# Patient Record
Sex: Female | Born: 1935 | Race: Black or African American | Hispanic: No | Smoking: Never smoker
Health system: Southern US, Community
[De-identification: ages and names within clinical notes are randomized; demographics above are authoritative.]

## PROBLEM LIST (undated history)

## (undated) DIAGNOSIS — F0390 Unspecified dementia without behavioral disturbance: Secondary | ICD-10-CM

## (undated) DIAGNOSIS — M810 Age-related osteoporosis without current pathological fracture: Secondary | ICD-10-CM

## (undated) DIAGNOSIS — M25562 Pain in left knee: Secondary | ICD-10-CM

## (undated) DIAGNOSIS — N3941 Urge incontinence: Secondary | ICD-10-CM

## (undated) DIAGNOSIS — C50919 Malignant neoplasm of unspecified site of unspecified female breast: Secondary | ICD-10-CM

## (undated) DIAGNOSIS — M25561 Pain in right knee: Secondary | ICD-10-CM

## (undated) HISTORY — PX: DENTAL SURGERY: SHX609

## (undated) HISTORY — DX: Malignant neoplasm of unspecified site of unspecified female breast: C50.919

---

## 1999-01-12 HISTORY — PX: BREAST LUMPECTOMY WITH SENTINEL LYMPH NODE BIOPSY: SHX5597

## 2017-01-17 DIAGNOSIS — M6281 Muscle weakness (generalized): Secondary | ICD-10-CM | POA: Diagnosis not present

## 2017-01-17 DIAGNOSIS — Z9181 History of falling: Secondary | ICD-10-CM | POA: Diagnosis not present

## 2017-01-17 DIAGNOSIS — F419 Anxiety disorder, unspecified: Secondary | ICD-10-CM | POA: Diagnosis not present

## 2017-01-17 DIAGNOSIS — M810 Age-related osteoporosis without current pathological fracture: Secondary | ICD-10-CM | POA: Diagnosis not present

## 2017-01-17 DIAGNOSIS — F329 Major depressive disorder, single episode, unspecified: Secondary | ICD-10-CM | POA: Diagnosis not present

## 2017-01-17 DIAGNOSIS — M1380 Other specified arthritis, unspecified site: Secondary | ICD-10-CM | POA: Diagnosis not present

## 2017-01-17 DIAGNOSIS — L853 Xerosis cutis: Secondary | ICD-10-CM | POA: Diagnosis not present

## 2017-01-17 DIAGNOSIS — R489 Unspecified symbolic dysfunctions: Secondary | ICD-10-CM | POA: Diagnosis not present

## 2017-01-17 DIAGNOSIS — G309 Alzheimer's disease, unspecified: Secondary | ICD-10-CM | POA: Diagnosis not present

## 2017-01-17 DIAGNOSIS — I10 Essential (primary) hypertension: Secondary | ICD-10-CM | POA: Diagnosis not present

## 2017-03-31 ENCOUNTER — Encounter: Payer: Self-pay | Admitting: Physician Assistant

## 2017-03-31 ENCOUNTER — Ambulatory Visit (INDEPENDENT_AMBULATORY_CARE_PROVIDER_SITE_OTHER): Payer: Medicare Other | Admitting: Physician Assistant

## 2017-03-31 ENCOUNTER — Other Ambulatory Visit: Payer: Self-pay

## 2017-03-31 VITALS — BP 146/80 | HR 102 | Temp 97.9°F | Resp 16 | Ht 65.75 in | Wt 144.8 lb

## 2017-03-31 DIAGNOSIS — F0390 Unspecified dementia without behavioral disturbance: Secondary | ICD-10-CM | POA: Insufficient documentation

## 2017-03-31 DIAGNOSIS — F0280 Dementia in other diseases classified elsewhere without behavioral disturbance: Secondary | ICD-10-CM | POA: Diagnosis not present

## 2017-03-31 DIAGNOSIS — F411 Generalized anxiety disorder: Secondary | ICD-10-CM | POA: Diagnosis not present

## 2017-03-31 DIAGNOSIS — Z0000 Encounter for general adult medical examination without abnormal findings: Secondary | ICD-10-CM

## 2017-03-31 DIAGNOSIS — R5383 Other fatigue: Secondary | ICD-10-CM

## 2017-03-31 DIAGNOSIS — M25562 Pain in left knee: Secondary | ICD-10-CM

## 2017-03-31 DIAGNOSIS — G309 Alzheimer's disease, unspecified: Secondary | ICD-10-CM

## 2017-03-31 DIAGNOSIS — Z5181 Encounter for therapeutic drug level monitoring: Secondary | ICD-10-CM

## 2017-03-31 DIAGNOSIS — M810 Age-related osteoporosis without current pathological fracture: Secondary | ICD-10-CM | POA: Diagnosis not present

## 2017-03-31 DIAGNOSIS — R54 Age-related physical debility: Secondary | ICD-10-CM | POA: Diagnosis not present

## 2017-03-31 DIAGNOSIS — G8929 Other chronic pain: Secondary | ICD-10-CM

## 2017-03-31 DIAGNOSIS — M25561 Pain in right knee: Secondary | ICD-10-CM | POA: Insufficient documentation

## 2017-03-31 DIAGNOSIS — N3941 Urge incontinence: Secondary | ICD-10-CM | POA: Diagnosis not present

## 2017-03-31 MED ORDER — MIRABEGRON ER 50 MG PO TB24: 50.0000 mg | ORAL_TABLET | Freq: Every day | ORAL | 5 refills | Status: DC

## 2017-03-31 NOTE — Progress Notes (Addendum)
Patient ID: Jocelyn Mccann MRN: [id number], DOB: [birth year], 84 y.o. Date of Encounter: @DATE @  Chief Complaint:  Chief Complaint  Patient presents with  . New Patient (Initial Visit)    HPI: 84 y.o. year old female  presents as a New Patient to Establish Care.  Her daughter accompanies her for visit. Her daughter reports that patient was living in [place]. Reports that they just had her move here to live with daughter and her family-- moved here January 26. Daughter states that patient is not happy about moving here because it has decreased her independence. Daughter reports that patient had lived in [place] for 20 years.   States that they had a hired caregiver who would help patient on Mondays Wednesdays Fridays.   Family would order food and the caregiver would drive and pick up the food.   States that patient had quit driving about 10 years ago.    Reports that patient fell in January.  The caregiver took her to the hospital and then she went to rehab for 20 days.  Was felt the patient could not live by herself anymore so she is now here living with daughter and her family.  Daughter does not work a job.  Patient's daughter reports that her husband works as a professor at [employer].  Says that her son is at college and her daughter is home.  Says that they make sure that someone is always at home with patient. Daughter states that she never was with patient when she went to her doctor's appointments in [place] so she really does not know patient's diagnoses/medical history.  Daughter says that she does notice that patient forgets a lot but says "but she is fine if she is watching TV." During the visit today I went through each of patient's medicines and explained what that particular medicine is usually used for. Explained that Aricept is used for dementia but discussed that the medicines for dementia will slow the progression of the disease but do not reverse the disease. Her daughter  states that she wants an evaluation to see if patient truly has dementia. Discussed that we will do a Mini-Mental exam today.  Daughter states that patient's knees hurt and that limits her activity and causes her to feel depressed because she cannot do anything secondary to knee pain. Daughter says that she wants a referral to [facility] orthopedics.  Wants to see if they could possibly do surgery or could possibly do injections to her knees. Reviewed with daughter that the medicine list includes Mobic and tramadol.  Asked how frequently she is taking the tramadol. Daughter states that she tries to get patient to take 1 tramadol per day but has hard time to get her to even take that.   Says the patient will drink some tea in the morning-- will have tea and crackers but then she does not want to take any kind of pill later in the day because she does not want to drink anything else later in the day because it will make her go to the bathroom too much. Says that she urinates too frequently and has urgency and can barely get to the bathroom in time. Says that she sleeps pretty good. Daughter also notes that she notices patient belching some. Daughter has no other specific concerns that she has noticed that needs to be addressed. Patient reports that she has no additional concerns that she is aware of to address. States that the urination is  a problem and that she is concerned that patient is dehydrated because she will not drink enough fluid because she does not want to go to the bathroom.  Addendum Added 05/02/2017: Received note from Dr. Tonita Cong at [facility].  Note was dated 04/22/2017. Arthritis of knees bilaterally.  Planned Visco supplementation.  See her back following that.  Rolling walker activity modification Tylenol with her tramadol.     No past medical history on file.   Home Meds: Outpatient Medications Prior to Visit  Medication Sig Dispense Refill  . alendronate  (FOSAMAX) 70 MG tablet Take 70 mg by mouth once a week. Take with a full glass of water on an empty stomach.    . busPIRone (BUSPAR) 7.5 MG tablet Take 7.5 mg by mouth daily.    . citalopram (CELEXA) 20 MG tablet Take 20 mg by mouth at bedtime.    . donepezil (ARICEPT) 5 MG tablet Take 5 mg by mouth at bedtime.    . meloxicam (MOBIC) 7.5 MG tablet Take 7.5 mg by mouth daily.    . traMADol (ULTRAM) 50 MG tablet Take 50 mg by mouth every 6 (six) hours as needed.     No facility-administered medications prior to visit.     Allergies: No Known Allergies  Social History   Socioeconomic History  . Marital status: Legally Separated    Spouse name: Not on file  . Number of children: Not on file  . Years of education: Not on file  . Highest education level: Not on file  Occupational History  . Not on file  Social Needs  . Financial resource strain: Not on file  . Food insecurity:    Worry: Not on file    Inability: Not on file  . Transportation needs:    Medical: Not on file    Non-medical: Not on file  Tobacco Use  . Smoking status: Never Smoker  . Smokeless tobacco: Never Used  Substance and Sexual Activity  . Alcohol use: Never    Frequency: Never  . Drug use: Never  . Sexual activity: Not on file  Lifestyle  . Physical activity:    Days per week: Not on file    Minutes per session: Not on file  . Stress: Not on file  Relationships  . Social connections:    Talks on phone: Not on file    Gets together: Not on file    Attends religious service: Not on file    Active member of club or organization: Not on file    Attends meetings of clubs or organizations: Not on file    Relationship status: Not on file  . Intimate partner violence:    Fear of current or ex partner: Not on file    Emotionally abused: Not on file    Physically abused: Not on file    Forced sexual activity: Not on file  Other Topics Concern  . Not on file  Social History Narrative  . Not on file     No family history on file.   Review of Systems:  See HPI for pertinent ROS. All other ROS negative.    Physical Exam: Blood pressure (!) 146/80, pulse (!) 102, temperature 97.9 F (36.6 C), temperature source Oral, resp. rate 16, height 5' 5.75" (1.67 m), weight 65.7 kg (144 lb 12.8 oz), SpO2 100 %., Body mass index is 23.55 kg/m. General: AAF. Appears in no acute distress. Neck: Supple. No thyromegaly. No lymphadenopathy.  No carotid bruits.  Lungs: Clear bilaterally to auscultation without wheezes, rales, or rhonchi. Breathing is unlabored. Heart: RRR with S1 S2. No murmurs, rubs, or gallops.  Cardiac sounds are loud but this is because she is then through the chest wall. Abdomen: Soft, non-tender, non-distended with normoactive bowel sounds. No hepatomegaly. No rebound/guarding. No obvious abdominal masses. Musculoskeletal:  Strength and tone normal for age. Extremities/Skin: Warm and dry.  No LE edema.  Neuro: Alert and oriented X 3. Moves all extremities spontaneously. Gait is normal. CNII-XII grossly in tact. Psych:  Responds to questions appropriately with a normal affect.    Mini mental exam was performed at visit 03/31/2017.  Total score was 19 which is consistent with moderate dementia.  ASSESSMENT AND PLAN:  84 y.o. year old female with    1. Encounter for medical examination to establish care Will check some labs to have as a baseline  2. Chronic pain of both knees Will start Myrbetriq and hopefully this will help to decrease her urinary frequency and urgency so then she can take the tramadol more frequently. Also will go ahead and refer to [facility] orthopedic per daughter's request. - AMB referral to orthopedics  Addendum Added 05/02/2017: Received note from Dr. Tonita Cong at [facility].  Note was dated 04/22/2017. Arthritis of knees bilaterally.  Planned Visco supplementation.  See her back following that.  Rolling walker activity modification Tylenol with  her tramadol.   3. Urge incontinence of urine Will start Myrbetriq and hope this will help decrease her frequency and urgency. - mirabegron ER (MYRBETRIQ) 50 MG TB24 tablet; Take 1 tablet (50 mg total) by mouth daily.  Dispense: 30 tablet; Refill: 5  4. Generalized anxiety disorder She is on BuSpar and Celexa.  I am assuming she has a history of anxiety given these medications but I have no other additional information regarding this.  5. Osteoporosis without current pathological fracture, unspecified osteoporosis type She is on Fosamax so I am assuming history of osteoporosis but I do not have any medical records.  I do not know the date of when the Fosamax was started and when this needs to be discontinued.  6. Alzheimer's dementia without behavioral disturbance, unspecified timing of dementia onset She is on Aricept so I am assuming this was started for dementia.  I have no records.  Daughter wants this evaluated so we are doing Mini-Mental exam today.   Will plan for follow-up visit in 1 month.  Follow-up sooner if needed.    [address] [place], [place], [facility] 03/31/2017 11:23 AM

## 2017-04-07 LAB — COMPLETE METABOLIC PANEL WITH GFR
AG RATIO: 1.1 (calc) (ref 1.0–2.5)
ALT: 10 U/L (ref 6–29)
AST: 16 U/L (ref 10–35)
Albumin: 3.8 g/dL (ref 3.6–5.1)
Alkaline phosphatase (APISO): 81 U/L (ref 33–130)
BUN: 14 mg/dL (ref 7–25)
CALCIUM: 9.6 mg/dL (ref 8.6–10.4)
CO2: 29 mmol/L (ref 20–32)
CREATININE: 0.85 mg/dL (ref 0.60–0.88)
Chloride: 103 mmol/L (ref 98–110)
GFR, EST AFRICAN AMERICAN: 71 mL/min/{1.73_m2} (ref >=60)
GFR, EST NON AFRICAN AMERICAN: 62 mL/min/{1.73_m2} (ref >=60)
GLOBULIN: 3.6 g/dL (ref 1.9–3.7)
Glucose, Bld: 120 mg/dL — ABNORMAL HIGH (ref 65–99)
POTASSIUM: 3.9 mmol/L (ref 3.5–5.3)
Sodium: 139 mmol/L (ref 135–146)
TOTAL PROTEIN: 7.4 g/dL (ref 6.1–8.1)
Total Bilirubin: 0.5 mg/dL (ref 0.2–1.2)

## 2017-04-07 LAB — CBC WITH DIFFERENTIAL/PLATELET
BASOS PCT: 0.6 %
Basophils Absolute: 20 cells/uL (ref 0–200)
EOS PCT: 0.3 %
Eosinophils Absolute: 10 cells/uL — ABNORMAL LOW (ref 15–500)
HEMATOCRIT: 40.8 % (ref 35.0–45.0)
Hemoglobin: 13.7 g/dL (ref 11.7–15.5)
LYMPHS ABS: 1297 {cells}/uL (ref 850–3900)
MCH: 30.8 pg (ref 27.0–33.0)
MCHC: 33.6 g/dL (ref 32.0–36.0)
MCV: 91.7 fL (ref 80.0–100.0)
MPV: 9.1 fL (ref 7.5–12.5)
Monocytes Relative: 13.8 %
Neutro Abs: 1518 cells/uL (ref 1500–7800)
Neutrophils Relative %: 46 %
Platelets: 221 10*3/uL (ref 140–400)
RBC: 4.45 10*6/uL (ref 3.80–5.10)
RDW: 12.1 % (ref 11.0–15.0)
Total Lymphocyte: 39.3 %
WBC: 3.3 10*3/uL — AB (ref 3.8–10.8)
WBCMIX: 455 {cells}/uL (ref 200–950)

## 2017-04-07 LAB — TEST AUTHORIZATION

## 2017-04-07 LAB — TEST AUTHORIZATION 2

## 2017-04-07 LAB — T4, FREE

## 2017-04-07 LAB — TSH: TSH: 0.37 mIU/L — ABNORMAL LOW (ref 0.40–4.50)

## 2017-04-07 LAB — HEMOGLOBIN A1C W/OUT EAG: Hgb A1c MFr Bld: 5.7 % of total Hgb — ABNORMAL HIGH (ref <5.7)

## 2017-04-14 ENCOUNTER — Other Ambulatory Visit: Payer: Medicare HMO

## 2017-04-14 ENCOUNTER — Other Ambulatory Visit: Payer: Self-pay

## 2017-04-14 DIAGNOSIS — R7989 Other specified abnormal findings of blood chemistry: Secondary | ICD-10-CM | POA: Diagnosis not present

## 2017-04-14 DIAGNOSIS — E039 Hypothyroidism, unspecified: Secondary | ICD-10-CM | POA: Diagnosis not present

## 2017-04-14 DIAGNOSIS — Z1329 Encounter for screening for other suspected endocrine disorder: Secondary | ICD-10-CM | POA: Diagnosis not present

## 2017-04-15 ENCOUNTER — Other Ambulatory Visit: Payer: Medicare HMO

## 2017-04-15 DIAGNOSIS — M25562 Pain in left knee: Secondary | ICD-10-CM | POA: Diagnosis not present

## 2017-04-15 DIAGNOSIS — M25561 Pain in right knee: Secondary | ICD-10-CM | POA: Diagnosis not present

## 2017-04-15 DIAGNOSIS — M170 Bilateral primary osteoarthritis of knee: Secondary | ICD-10-CM | POA: Diagnosis not present

## 2017-04-15 LAB — TSH: TSH: 0.43 m[IU]/L (ref 0.40–4.50)

## 2017-04-15 LAB — T4, FREE: FREE T4: 1.4 ng/dL (ref 0.8–1.8)

## 2017-04-22 DIAGNOSIS — M1711 Unilateral primary osteoarthritis, right knee: Secondary | ICD-10-CM | POA: Diagnosis not present

## 2017-04-22 DIAGNOSIS — M1712 Unilateral primary osteoarthritis, left knee: Secondary | ICD-10-CM | POA: Diagnosis not present

## 2017-04-22 DIAGNOSIS — M25561 Pain in right knee: Secondary | ICD-10-CM | POA: Diagnosis not present

## 2017-04-22 DIAGNOSIS — M25562 Pain in left knee: Secondary | ICD-10-CM | POA: Diagnosis not present

## 2017-05-04 ENCOUNTER — Other Ambulatory Visit: Payer: Self-pay | Admitting: Physician Assistant

## 2017-05-04 ENCOUNTER — Encounter: Payer: Self-pay | Admitting: Physician Assistant

## 2017-05-04 ENCOUNTER — Ambulatory Visit (INDEPENDENT_AMBULATORY_CARE_PROVIDER_SITE_OTHER): Payer: Medicare Other | Admitting: Physician Assistant

## 2017-05-04 ENCOUNTER — Other Ambulatory Visit: Payer: Self-pay

## 2017-05-04 VITALS — BP 144/78 | HR 83 | Temp 97.4°F | Resp 14 | Ht 66.0 in | Wt 142.2 lb

## 2017-05-04 DIAGNOSIS — R3915 Urgency of urination: Secondary | ICD-10-CM | POA: Diagnosis not present

## 2017-05-04 DIAGNOSIS — R350 Frequency of micturition: Secondary | ICD-10-CM

## 2017-05-04 DIAGNOSIS — N390 Urinary tract infection, site not specified: Secondary | ICD-10-CM | POA: Diagnosis not present

## 2017-05-04 LAB — URINALYSIS, ROUTINE W REFLEX MICROSCOPIC
BILIRUBIN URINE: NEGATIVE
GLUCOSE, UA: NEGATIVE
Leukocytes, UA: NEGATIVE
Nitrite: POSITIVE — AB
Specific Gravity, Urine: 1.02 (ref 1.001–1.03)
pH: 6 (ref 5.0–8.0)

## 2017-05-04 LAB — MICROSCOPIC MESSAGE

## 2017-05-04 MED ORDER — CIPROFLOXACIN HCL 500 MG PO TABS: 500.0000 mg | ORAL_TABLET | Freq: Two times a day (BID) | ORAL | 0 refills | Status: AC

## 2017-05-04 NOTE — Progress Notes (Signed)
Patient ID: ANU STAGNER MRN: [id number], DOB: 11/07/1928, 84 y.o. Date of Encounter: @DATE @  Chief Complaint:  Chief Complaint  Patient presents with  . 1 month follow up    HPI: 84 y.o. year old female     03/31/2017:  presents as a New Patient to [facility].  Her daughter accompanies her for visit. Her daughter reports that patient was living in [place]. Reports that they just had her move here to live with daughter and her family-- moved here January 26. Daughter states that patient is not happy about moving here because it has decreased her independence. Daughter reports that patient had lived in [place] for 20 years.   States that they had a hired caregiver who would help patient on Mondays Wednesdays Fridays.   Family would order food and the caregiver would drive and pick up the food.   States that patient had quit driving about 10 years ago.    Reports that patient fell in January.  The caregiver took her to the hospital and then she went to rehab for 20 days.  Was felt the patient could not live by herself anymore so she is now here living with daughter and her family.  Daughter does not work a job.  Patient's daughter reports that her husband works as a professor at [employer].  Says that her son is at college and her daughter is home.  Says that they make sure that someone is always at home with patient. Daughter states that she never was with patient when she went to her doctor's appointments in [place] so she really does not know patient's diagnoses/medical history.  Daughter says that she does notice that patient forgets a lot but says "but she is fine if she is watching TV." During the visit today I went through each of patient's medicines and explained what that particular medicine is usually used for. Explained that Aricept is used for dementia but discussed that the medicines for dementia will slow the progression of the disease but do not reverse the  disease. Her daughter states that she wants an evaluation to see if patient truly has dementia. Discussed that we will do a Mini-Mental exam today.  Daughter states that patient's knees hurt and that limits her activity and causes her to feel depressed because she cannot do anything secondary to knee pain. Daughter says that she wants a referral to [facility] orthopedics.  Wants to see if they could possibly do surgery or could possibly do injections to her knees. Reviewed with daughter that the medicine list includes Mobic and tramadol.  Asked how frequently she is taking the tramadol. Daughter states that she tries to get patient to take 1 tramadol per day but has hard time to get her to even take that.   Says the patient will drink some tea in the morning-- will have tea and crackers but then she does not want to take any kind of pill later in the day because she does not want to drink anything else later in the day because it will make her go to the bathroom too much. Says that she urinates too frequently and has urgency and can barely get to the bathroom in time. Says that she sleeps pretty good. Daughter also notes that she notices patient belching some. Daughter has no other specific concerns that she has noticed that needs to be addressed. Patient reports that she has no additional concerns that she is aware of to address.  States that the urination is a problem and that she is concerned that patient is dehydrated because she will not drink enough fluid because she does not want to go to the bathroom.  Addendum Added 05/02/2017: Received note from Dr. Tonita Cong at [facility].  Note was dated 04/22/2017. Arthritis of knees bilaterally.  Planned Visco supplementation.  See her back following that.  Rolling walker activity modification Tylenol with her tramadol.    AT THAT OV:  Mini-Mental exam was performed at visit 03/31/2017.  Total score was 19 which is consistent with moderate  dementia.   At that visit also check labs to have as a baseline. Labs obtained at office visit 03/31/17 CBC normal CMET normal A1c normal 5.7 On initial lab TSH was slightly low at 0.37 (normal range starts at 0.40) Had her come in for repeat/follow-up lab and this was checked TSH was within normal range at 0.43 and free T4 was normal at 1.4.  At that visit also --Referral to [facility] orthopedics regarding her knees --Prescribed Myrbetriq to help with her urinary urgency and frequency -- Continue BuSpar and Celexa --Continued Fosamax --Continue Aricept.   --------------------------------------------------------------------------------------------------------------------------------------------------------------------------------------------------------------------------------------------------------------   05/04/2017:  Her daughter accompanies her for visit again today. Reviewed all of the lab results with them from the last visit and reviewed with them that all labs were normal.   Reviewed above information.  Asked if the Myrbetriq has helped with the urinary urgency and frequency.  They report that she is taking the Myrbetriq daily but they have seen no improvement in the symptoms.  They report that she continues to have significant urinary frequency and urgency and continues to limit her drinking of liquids because she does not want to have to urinate.    No past medical history on file.   Home Meds: Outpatient Medications Prior to Visit  Medication Sig Dispense Refill  . alendronate (FOSAMAX) 70 MG tablet Take 70 mg by mouth once a week. Take with a full glass of water on an empty stomach.    . busPIRone (BUSPAR) 7.5 MG tablet Take 7.5 mg by mouth daily.    . citalopram (CELEXA) 20 MG tablet Take 20 mg by mouth at bedtime.    . donepezil (ARICEPT) 5 MG tablet Take 5 mg by mouth at bedtime.    . meloxicam (MOBIC) 7.5 MG tablet Take 7.5 mg by mouth daily.    . mirabegron ER  (MYRBETRIQ) 50 MG TB24 tablet Take 1 tablet (50 mg total) by mouth daily. 30 tablet 5  . traMADol (ULTRAM) 50 MG tablet Take 50 mg by mouth every 6 (six) hours as needed.     No facility-administered medications prior to visit.     Allergies: No Known Allergies  Social History   Socioeconomic History  . Marital status: Legally Separated    Spouse name: Not on file  . Number of children: Not on file  . Years of education: Not on file  . Highest education level: Not on file  Occupational History  . Not on file  Social Needs  . Financial resource strain: Not on file  . Food insecurity:    Worry: Not on file    Inability: Not on file  . Transportation needs:    Medical: Not on file    Non-medical: Not on file  Tobacco Use  . Smoking status: Never Smoker  . Smokeless tobacco: Never Used  Substance and Sexual Activity  . Alcohol use: Never    Frequency: Never  .  Drug use: Never  . Sexual activity: Not on file  Lifestyle  . Physical activity:    Days per week: Not on file    Minutes per session: Not on file  . Stress: Not on file  Relationships  . Social connections:    Talks on phone: Not on file    Gets together: Not on file    Attends religious service: Not on file    Active member of club or organization: Not on file    Attends meetings of clubs or organizations: Not on file    Relationship status: Not on file  . Intimate partner violence:    Fear of current or ex partner: Not on file    Emotionally abused: Not on file    Physically abused: Not on file    Forced sexual activity: Not on file  Other Topics Concern  . Not on file  Social History Narrative  . Not on file    No family history on file.   Review of Systems:  See HPI for pertinent ROS. All other ROS negative.    Physical Exam: Blood pressure (!) 144/78, pulse 83, temperature (!) 97.4 F (36.3 C), temperature source Oral, resp. rate 14, height 5\' 6"  (1.676 m), weight 64.5 kg (142 lb 3.2 oz), SpO2  97 %., Body mass index is 22.95 kg/m. General: AAF. Appears in no acute distress. Neck: Supple. No thyromegaly. No lymphadenopathy.  No carotid bruits. Lungs: Clear bilaterally to auscultation without wheezes, rales, or rhonchi. Breathing is unlabored. Heart: RRR with S1 S2. No murmurs, rubs, or gallops.  Cardiac sounds are loud but this is because she is then through the chest wall. Abdomen: Soft, non-tender, non-distended with normoactive bowel sounds. No hepatomegaly. No rebound/guarding. No obvious abdominal masses. Musculoskeletal:  Strength and tone normal for age. Extremities/Skin: Warm and dry.  No LE edema.  Neuro: Alert and oriented X 3. Moves all extremities spontaneously. Gait is normal. CNII-XII grossly in tact. Psych:  Responds to questions appropriately with a normal affect.    Mini mental exam was performed at visit 03/31/2017.  Total score was 19 which is consistent with moderate dementia.  ASSESSMENT AND PLAN:  84 y.o. year old female with    Urinary frequency Urinalysis does show findings consistent with UTI.  Will have her go ahead and start Cipro and will send culture and follow this up as well. - Urinalysis, Routine w reflex microscopic - Urine Culture - ciprofloxacin (CIPRO) 500 MG tablet; Take 1 tablet (500 mg total) by mouth 2 (two) times daily for 7 days.  Dispense: 14 tablet; Refill: 0  Urinary urgency - Urinalysis, Routine w reflex microscopic - Urine Culture - ciprofloxacin (CIPRO) 500 MG tablet; Take 1 tablet (500 mg total) by mouth 2 (two) times daily for 7 days.  Dispense: 14 tablet; Refill: 0  Urinary tract infection without hematuria, site unspecified - ciprofloxacin (CIPRO) 500 MG tablet; Take 1 tablet (500 mg total) by mouth 2 (two) times daily for 7 days.  Dispense: 14 tablet; Refill: 0    Chronic pain of both knees This is now being managed by Dr. Tonita Cong at [facility]. Addendum Added 05/02/2017: Received note from Dr. Tonita Cong at  [facility].  Note was dated 04/22/2017. Arthritis of knees bilaterally.  Planned Visco supplementation.  See her back following that.  Rolling walker activity modification Tylenol with her tramadol.  Generalized anxiety disorder This is stable and controlled--- on BuSpar and Celexa.  Continue current meds same with  no change.  Osteoporosis without current pathological fracture, unspecified osteoporosis type She is on Fosamax so I am assuming history of osteoporosis but I do not have any medical records.  I do not know the date of when the Fosamax was started and when this needs to be discontinued.  Alzheimer's dementia without behavioral disturbance, unspecified timing of dementia onset She was already taking the Aricept prior to being established with me.  We will continue this at current dose.  She did have Mini-Mental exam performed at our office at her initial visit on 03/31/17.  Findings consistent with moderate dementia.  Will plan for follow-up visit in 3 months.  Follow-up sooner if needed.    [address] [place], [place], [facility] 05/04/2017 12:03 PM

## 2017-05-05 ENCOUNTER — Telehealth: Payer: Self-pay

## 2017-05-05 NOTE — Telephone Encounter (Signed)
Received call from [facility] states Citalopram and Cipro given together can cause QT intervals to increase and would like to confirm you would still like for them to dispense. Pls advise

## 2017-05-05 NOTE — Telephone Encounter (Signed)
Approved.  

## 2017-05-05 NOTE — Telephone Encounter (Signed)
Call placed to [facility] spoke with Marita [place] and confirmed pcp is fine with them dispensing the medicatons.

## 2017-05-06 LAB — URINE CULTURE
MICRO NUMBER:: 90501314
SPECIMEN QUALITY:: ADEQUATE

## 2017-06-01 DIAGNOSIS — M170 Bilateral primary osteoarthritis of knee: Secondary | ICD-10-CM | POA: Diagnosis not present

## 2017-06-01 DIAGNOSIS — M1710 Unilateral primary osteoarthritis, unspecified knee: Secondary | ICD-10-CM | POA: Insufficient documentation

## 2017-06-01 DIAGNOSIS — M179 Osteoarthritis of knee, unspecified: Secondary | ICD-10-CM | POA: Insufficient documentation

## 2017-06-08 DIAGNOSIS — M1711 Unilateral primary osteoarthritis, right knee: Secondary | ICD-10-CM | POA: Diagnosis not present

## 2017-06-08 DIAGNOSIS — M1712 Unilateral primary osteoarthritis, left knee: Secondary | ICD-10-CM | POA: Diagnosis not present

## 2017-08-08 ENCOUNTER — Ambulatory Visit (INDEPENDENT_AMBULATORY_CARE_PROVIDER_SITE_OTHER): Payer: Medicare HMO | Admitting: Physician Assistant

## 2017-08-08 ENCOUNTER — Encounter: Payer: Self-pay | Admitting: Physician Assistant

## 2017-08-08 ENCOUNTER — Other Ambulatory Visit: Payer: Self-pay

## 2017-08-08 VITALS — BP 142/68 | HR 97 | Temp 98.2°F | Resp 18 | Ht 66.0 in | Wt 138.4 lb

## 2017-08-08 DIAGNOSIS — M25561 Pain in right knee: Secondary | ICD-10-CM | POA: Diagnosis not present

## 2017-08-08 DIAGNOSIS — M810 Age-related osteoporosis without current pathological fracture: Secondary | ICD-10-CM | POA: Diagnosis not present

## 2017-08-08 DIAGNOSIS — M25562 Pain in left knee: Secondary | ICD-10-CM

## 2017-08-08 DIAGNOSIS — G8929 Other chronic pain: Secondary | ICD-10-CM

## 2017-08-08 DIAGNOSIS — F0391 Unspecified dementia with behavioral disturbance: Secondary | ICD-10-CM

## 2017-08-08 DIAGNOSIS — N6310 Unspecified lump in the right breast, unspecified quadrant: Secondary | ICD-10-CM | POA: Diagnosis not present

## 2017-08-08 MED ORDER — DONEPEZIL HCL 10 MG PO TABS: 10.0000 mg | ORAL_TABLET | Freq: Every day | ORAL | 5 refills | Status: DC

## 2017-08-08 NOTE — Progress Notes (Signed)
Patient ID: Jocelyn Mccann MRN: [id number], DOB: 03-01-1928, 84 y.o. Date of Encounter: @DATE @  Chief Complaint:  Chief Complaint  Patient presents with  . 3 month follow up dementia  . Dizziness  . discuss dose on aricept    HPI: 84 y.o. year old female     03/31/2017:  presents as a New Patient to [facility].  Her daughter accompanies her for visit. Her daughter reports that patient was living in [place]. Reports that they just had her move here to live with daughter and her family-- moved here January 26. Daughter states that patient is not happy about moving here because it has decreased her independence. Daughter reports that patient had lived in [place] for 20 years.   States that they had a hired caregiver who would help patient on Mondays Wednesdays Fridays.   Family would order food and the caregiver would drive and pick up the food.   States that patient had quit driving about 10 years ago.    Reports that patient fell in January.  The caregiver took her to the hospital and then she went to rehab for 20 days.  Was felt the patient could not live by herself anymore so she is now here living with daughter and her family.  Daughter does not work a job.  Patient's daughter reports that her husband works as a professor at [employer].  Says that her son is at college and her daughter is home.  Says that they make sure that someone is always at home with patient. Daughter states that she never was with patient when she went to her doctor's appointments in [place] so she really does not know patient's diagnoses/medical history.  Daughter says that she does notice that patient forgets a lot but says "but she is fine if she is watching TV." During the visit today I went through each of patient's medicines and explained what that particular medicine is usually used for. Explained that Aricept is used for dementia but discussed that the medicines for dementia will slow the progression  of the disease but do not reverse the disease. Her daughter states that she wants an evaluation to see if patient truly has dementia. Discussed that we will do a Mini-Mental exam today.  Daughter states that patient's knees hurt and that limits her activity and causes her to feel depressed because she cannot do anything secondary to knee pain. Daughter says that she wants a referral to [facility] orthopedics.  Wants to see if they could possibly do surgery or could possibly do injections to her knees. Reviewed with daughter that the medicine list includes Mobic and tramadol.  Asked how frequently she is taking the tramadol. Daughter states that she tries to get patient to take 1 tramadol per day but has hard time to get her to even take that.   Says the patient will drink some tea in the morning-- will have tea and crackers but then she does not want to take any kind of pill later in the day because she does not want to drink anything else later in the day because it will make her go to the bathroom too much. Says that she urinates too frequently and has urgency and can barely get to the bathroom in time. Says that she sleeps pretty good. Daughter also notes that she notices patient belching some. Daughter has no other specific concerns that she has noticed that needs to be addressed. Patient reports that she has  no additional concerns that she is aware of to address. States that the urination is a problem and that she is concerned that patient is dehydrated because she will not drink enough fluid because she does not want to go to the bathroom.  Addendum Added 05/02/2017: Received note from Dr. Tonita Cong at [facility].  Note was dated 04/22/2017. Arthritis of knees bilaterally.  Planned Visco supplementation.  See her back following that.  Rolling walker activity modification Tylenol with her tramadol.    AT THAT OV:  Mini-Mental exam was performed at visit 03/31/2017.  Total score was  19 which is consistent with moderate dementia.   At that visit also check labs to have as a baseline. Labs obtained at office visit 03/31/17 CBC normal CMET normal A1c normal 5.7 On initial lab TSH was slightly low at 0.37 (normal range starts at 0.40) Had her come in for repeat/follow-up lab and this was checked TSH was within normal range at 0.43 and free T4 was normal at 1.4.  At that visit also --Referral to [facility] orthopedics regarding her knees --Prescribed Myrbetriq to help with her urinary urgency and frequency -- Continue BuSpar and Celexa --Continued Fosamax --Continue Aricept.   --------------------------------------------------------------------------------------------------------------------------------------------------------------------------------------------------------------------------------------------------------------   05/04/2017:  Her daughter accompanies her for visit again today. Reviewed all of the lab results with them from the last visit and reviewed with them that all labs were normal.   Reviewed above information.  Asked if the Myrbetriq has helped with the urinary urgency and frequency.  They report that she is taking the Myrbetriq daily but they have seen no improvement in the symptoms.  They report that she continues to have significant urinary frequency and urgency and continues to limit her drinking of liquids because she does not want to have to urinate.    08/08/2017: Today patient is accompanied by her daughter as well as granddaughter. Daughter reports that she is noticing that patient is getting more forgetful.  She also reports the patient is not sleeping much at night.  States that she is listening to her radio at night.  Daughter also states that she is having issues with patient being adamant that she wants to wear certain close.  Says that she will demand to wear the same close day after day.  Not changing her close.  Daughter also states  that they have a walker available as well as her cane but the patient will be adamant that she is going to use her cane and not her walker.  States the patient is able to dress herself and shower herself and is able to do those activities independently but that she is noticing these other behavior changes.  Interested in increasing dose of Aricept. No other specific concerns. Start doing her physical exam and listening to her heart, patient then mentions noticing mass on right breast.  Daughter then comments that she has not heard anything about this and that patient has not told her anything about this.  See physical exam and assessment below regarding further information about that.  No other specific concerns today. Daughter than reports that patient has had a lumpectomy in the past.   History reviewed. No pertinent past medical history.   Home Meds: Outpatient Medications Prior to Visit  Medication Sig Dispense Refill  . alendronate (FOSAMAX) 70 MG tablet Take 70 mg by mouth once a week. Take with a full glass of water on an empty stomach.    . busPIRone (BUSPAR) 7.5 MG tablet TAKE  1 TABLET BY MOUTH EVERY DAY 30 tablet 1  . citalopram (CELEXA) 20 MG tablet TAKE 1 TABLET EVERY NIGHT AT BEDTIME 30 tablet 1  . meloxicam (MOBIC) 7.5 MG tablet Take 7.5 mg by mouth daily.    . mirabegron ER (MYRBETRIQ) 50 MG TB24 tablet Take 1 tablet (50 mg total) by mouth daily. 30 tablet 5  . traMADol (ULTRAM) 50 MG tablet Take 50 mg by mouth every 6 (six) hours as needed.    . donepezil (ARICEPT) 5 MG tablet TAKE 1 TABLET EVERY NIGHT AT BEDTIME 30 tablet 1  . busPIRone (BUSPAR) 7.5 MG tablet Take 7.5 mg by mouth daily.     No facility-administered medications prior to visit.     Allergies: No Known Allergies  Social History   Socioeconomic History  . Marital status: Legally Separated    Spouse name: Not on file  . Number of children: Not on file  . Years of education: Not on file  . Highest education  level: Not on file  Occupational History  . Not on file  Social Needs  . Financial resource strain: Not on file  . Food insecurity:    Worry: Not on file    Inability: Not on file  . Transportation needs:    Medical: Not on file    Non-medical: Not on file  Tobacco Use  . Smoking status: Never Smoker  . Smokeless tobacco: Never Used  Substance and Sexual Activity  . Alcohol use: Never    Frequency: Never  . Drug use: Never  . Sexual activity: Not on file  Lifestyle  . Physical activity:    Days per week: Not on file    Minutes per session: Not on file  . Stress: Not on file  Relationships  . Social connections:    Talks on phone: Not on file    Gets together: Not on file    Attends religious service: Not on file    Active member of club or organization: Not on file    Attends meetings of clubs or organizations: Not on file    Relationship status: Not on file  . Intimate partner violence:    Fear of current or ex partner: Not on file    Emotionally abused: Not on file    Physically abused: Not on file    Forced sexual activity: Not on file  Other Topics Concern  . Not on file  Social History Narrative  . Not on file    History reviewed. No pertinent family history.   Review of Systems:  See HPI for pertinent ROS. All other ROS negative.    Physical Exam: Blood pressure (!) 142/68, pulse 97, temperature 98.2 F (36.8 C), temperature source Oral, resp. rate 18, height 5\' 6"  (1.676 m), weight 62.8 kg (138 lb 6.4 oz), SpO2 99 %., Body mass index is 22.34 kg/m. General: AAF. Appears in no acute distress. Neck: Supple. No thyromegaly. No lymphadenopathy. Lungs: Clear bilaterally to auscultation without wheezes, rales, or rhonchi. Breathing is unlabored. Heart: RRR with S1 S2. No murmurs, rubs, or gallops. Breast Exam: Left breast is soft throughout.  Right Breast: At ~ 9 o'clock position-- there is ~ 2cm firm mass.  At same area-- there is scar c/w prior lumpectomy  site.  Abdomen: Soft, non-tender, non-distended with normoactive bowel sounds. No hepatomegaly. No rebound/guarding. No obvious abdominal masses. Musculoskeletal:  Strength and tone normal for age. Extremities/Skin: Warm and dry.  No LE edema.  Neuro: Alert and  oriented X 3. Moves all extremities spontaneously. Gait is normal. CNII-XII grossly in tact. Psych:  Responds to questions appropriately with a normal affect.    Mini mental exam was performed at visit 03/31/2017.  Total score was 19 which is consistent with moderate dementia.  ASSESSMENT AND PLAN:  84 y.o. year old female with    Mass of right breast 08/08/2017: See HPI and physical exam findings from today's date.  Will obtain mammogram and ultrasound to further evaluate. - MM Digital Diagnostic Bilat; Future - US BREAST LTD UNI RIGHT INC AXILLA; Future - US BREAST LTD UNI LEFT INC AXILLA; Future  Dementia with behavioral disturbance, unspecified dementia type 08/08/2017: Will increase Aricept from 5 mg to 10 mg daily.  At next visit we will also discuss adding Namenda.  Future visits will also discuss possible referral to [facility] or other geriatric specialist.  Or if this is continues to be managed here then will need to discuss support system for the daughter/caregiver. - donepezil (ARICEPT) 10 MG tablet; Take 1 tablet (10 mg total) by mouth at bedtime.  Dispense: 30 tablet; Refill: 5   Chronic pain of both knees 08/08/2017: This is now being managed by Dr. Tonita Cong at [facility]. Addendum Added 05/02/2017: Received note from Dr. Tonita Cong at [place].  Note was dated 04/22/2017. Arthritis of knees bilaterally.  Planned Visco supplementation.  See her back following that.  Rolling walker activity modification Tylenol with her tramadol.  Generalized anxiety disorder 08/08/2017: This is stable and controlled--- on BuSpar and Celexa.  Continue current meds same with no change.  Osteoporosis without  current pathological fracture, unspecified osteoporosis type 08/08/2017: She is on Fosamax so I am assuming history of osteoporosis but I do not have any medical records.  I do not know the date of when the Fosamax was started and when this needs to be discontinued.     08/08/2017: Will plan for follow-up visit in 1 month.  Follow-up sooner if needed.    [address] [place], [place], [facility] 08/08/2017 12:17 PM

## 2017-08-10 ENCOUNTER — Encounter: Payer: Self-pay | Admitting: *Deleted

## 2017-09-08 ENCOUNTER — Ambulatory Visit: Payer: Medicare HMO | Admitting: Physician Assistant

## 2017-09-14 ENCOUNTER — Other Ambulatory Visit: Payer: Self-pay | Admitting: Physician Assistant

## 2017-09-20 ENCOUNTER — Telehealth: Payer: Self-pay | Admitting: Physician Assistant

## 2017-09-20 ENCOUNTER — Encounter: Payer: Self-pay | Admitting: Physician Assistant

## 2017-09-20 NOTE — Telephone Encounter (Signed)
Adult daycare forms dropped off placed into yellow folder.

## 2017-09-21 NOTE — Telephone Encounter (Signed)
Form given to [facility]

## 2017-09-23 NOTE — Telephone Encounter (Signed)
Call placed to [facility] left voicemail that Adult daycare forms were available for pick up at the front desk

## 2017-11-14 ENCOUNTER — Ambulatory Visit
Admission: RE | Admit: 2017-11-14 | Discharge: 2017-11-14 | Disposition: A | Discharge status: Home / Self Care | Payer: Medicare HMO | Source: Ambulatory Visit | Attending: Physician Assistant | Admitting: Physician Assistant

## 2017-11-14 ENCOUNTER — Other Ambulatory Visit: Payer: Self-pay | Admitting: Physician Assistant

## 2017-11-14 DIAGNOSIS — N6310 Unspecified lump in the right breast, unspecified quadrant: Secondary | ICD-10-CM

## 2017-11-17 ENCOUNTER — Ambulatory Visit
Admission: RE | Admit: 2017-11-17 | Discharge: 2017-11-17 | Disposition: A | Discharge status: Home / Self Care | Payer: Medicare HMO | Source: Ambulatory Visit | Attending: Physician Assistant | Admitting: Physician Assistant

## 2017-11-17 ENCOUNTER — Other Ambulatory Visit: Payer: Self-pay | Admitting: Physician Assistant

## 2017-11-17 DIAGNOSIS — N6310 Unspecified lump in the right breast, unspecified quadrant: Secondary | ICD-10-CM

## 2017-11-22 ENCOUNTER — Other Ambulatory Visit: Payer: Self-pay

## 2017-11-22 ENCOUNTER — Other Ambulatory Visit: Payer: Self-pay | Admitting: General Surgery

## 2017-11-22 DIAGNOSIS — Z170 Estrogen receptor positive status [ER+]: Principal | ICD-10-CM

## 2017-11-22 DIAGNOSIS — C50411 Malignant neoplasm of upper-outer quadrant of right female breast: Secondary | ICD-10-CM

## 2017-11-23 ENCOUNTER — Other Ambulatory Visit: Payer: Self-pay | Admitting: Oncology

## 2017-11-24 ENCOUNTER — Encounter: Payer: Self-pay | Admitting: Family Medicine

## 2017-11-24 DIAGNOSIS — C50919 Malignant neoplasm of unspecified site of unspecified female breast: Secondary | ICD-10-CM | POA: Insufficient documentation

## 2017-11-30 ENCOUNTER — Other Ambulatory Visit (HOSPITAL_COMMUNITY): Payer: Medicare HMO

## 2017-12-06 ENCOUNTER — Telehealth: Payer: Self-pay | Admitting: Oncology

## 2017-12-06 ENCOUNTER — Encounter: Payer: Self-pay | Admitting: Oncology

## 2017-12-06 NOTE — Telephone Encounter (Signed)
New referral received from Dr. Barry Dienes for breast cancer. Pt has been scheduled to see Dr. Jana Hakim on 12/10 at 4pm. I cld and lft thept a vm on 11/18 but I haven't received a response from the pt. I will mail the pt a letter with the appt info.

## 2017-12-11 HISTORY — PX: BREAST LUMPECTOMY: SHX2

## 2017-12-12 ENCOUNTER — Ambulatory Visit
Admission: RE | Admit: 2017-12-12 | Discharge: 2017-12-12 | Disposition: A | Discharge status: Home / Self Care | Payer: Medicare HMO | Source: Ambulatory Visit | Attending: Radiation Oncology | Admitting: Radiation Oncology

## 2017-12-12 ENCOUNTER — Ambulatory Visit: Payer: Medicare HMO

## 2017-12-12 DIAGNOSIS — C50411 Malignant neoplasm of upper-outer quadrant of right female breast: Secondary | ICD-10-CM | POA: Insufficient documentation

## 2017-12-12 DIAGNOSIS — Z170 Estrogen receptor positive status [ER+]: Secondary | ICD-10-CM | POA: Insufficient documentation

## 2017-12-12 DIAGNOSIS — Z79899 Other long term (current) drug therapy: Secondary | ICD-10-CM | POA: Insufficient documentation

## 2017-12-14 ENCOUNTER — Encounter: Payer: Self-pay | Admitting: Radiation Oncology

## 2017-12-14 ENCOUNTER — Other Ambulatory Visit: Payer: Self-pay

## 2017-12-14 ENCOUNTER — Ambulatory Visit
Admission: RE | Admit: 2017-12-14 | Discharge: 2017-12-14 | Disposition: A | Discharge status: Home / Self Care | Payer: Medicare HMO | Source: Ambulatory Visit | Attending: Radiation Oncology | Admitting: Radiation Oncology

## 2017-12-14 VITALS — BP 138/76 | HR 84 | Temp 97.9°F | Resp 20 | Ht 66.0 in | Wt 145.0 lb

## 2017-12-14 DIAGNOSIS — Z170 Estrogen receptor positive status [ER+]: Principal | ICD-10-CM

## 2017-12-14 DIAGNOSIS — C50411 Malignant neoplasm of upper-outer quadrant of right female breast: Secondary | ICD-10-CM | POA: Diagnosis not present

## 2017-12-14 DIAGNOSIS — Z79899 Other long term (current) drug therapy: Secondary | ICD-10-CM | POA: Diagnosis not present

## 2017-12-14 HISTORY — DX: Age-related osteoporosis without current pathological fracture: M81.0

## 2017-12-14 HISTORY — DX: Unspecified dementia, unspecified severity, without behavioral disturbance, psychotic disturbance, mood disturbance, and anxiety: F03.90

## 2017-12-14 HISTORY — DX: Pain in right knee: M25.561

## 2017-12-14 HISTORY — DX: Urge incontinence: N39.41

## 2017-12-14 HISTORY — DX: Pain in left knee: M25.562

## 2017-12-14 NOTE — Progress Notes (Signed)
Radiation Oncology         [phone] ________________________________  Initial Outpatient Consultation  Name: Jocelyn Mccann MRN: [id number]  Date: 12/14/2017  DOB: 07-28-1928  CB:JSEGB, Lonie Peak, PA-C  Stark Klein, MD   REFERRING PHYSICIAN: Stark Klein, MD  DIAGNOSIS: The encounter diagnosis was Malignant neoplasm of upper-outer quadrant of right breast in female, estrogen receptor positive ([place]). 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 Clinical stage 1 (T1c, N0) vs recurrent breast cancer  Prognostic indicators significant for: ER, 100% positive with strong staining intensity and PR, 0% negative. Proliferation marker Ki67 at 8%. HER2-.   HISTORY OF PRESENT ILLNESS::Jocelyn Mccann is a 84 y.o. female who is presenting to the office today for evaluation of breast cancer. She is accompanied by her daughter. She recently relocated to the [place] area after suffering a fall in January in [place]. She has lived under care of her daughter since February. Prior to this, she had been living by herself since 1998.   She had previous lumpectomy surgery in July of 2001. She received a bilateral routine screening mammogram on June 23, 2016 that showed increased nodularity and distortion about the upper outer central right breast in the region of previous breast carcinoma, highly suspicious for reccurent disease. Additional screening was recommended but not performed at this time.  She underwent a diagnostic bilateral mammogram with right breast ultrasound on November 14, 2017 which revealed a growing and suspicious 1.4 x1.3 x 1.9 cm mass in the right breast at 9:30, 1 cm from the nipple. Her previous scan showed the mass to be 1.0 x 1.0 x 1.4 cm. Accordingly, she received a right breast needle core biopsy of the mass which pathology found to be invasive mammary carcinoma, grade II-III. Prognostic indicators were significant for: ER, 100% positive with strong  staining intensity and PR, 0% negative. Proliferation marker Ki67 at 8%. HER2-.  she reports associated breast pain bilaterally, more severe in her left. The pain is worse when she inhales deeply. She reports feeling a lump in her lower back. she denies nipple discharge or bleeding and any other symptoms.   She is scheduled for lumpectomy on December 30, 2017.    PREVIOUS RADIATION THERAPY: No  PAST MEDICAL HISTORY:  has a past medical history of Bilateral knee pain, Breast cancer ([place]), Dementia ([place]), Osteoporosis, and Urge incontinence of urine.    PAST SURGICAL HISTORY:History reviewed. No pertinent surgical history.  FAMILY HISTORY: family history is not on file.  SOCIAL HISTORY:  reports that she has never smoked. She has never used smokeless tobacco. She reports that she does not drink alcohol or use drugs.  ALLERGIES: Patient has no known allergies.  MEDICATIONS:  Current Outpatient Medications  Medication Sig Dispense Refill  . busPIRone (BUSPAR) 7.5 MG tablet TAKE 1 TABLET BY MOUTH EVERY DAY 30 tablet 1  . citalopram (CELEXA) 20 MG tablet TAKE 1 TABLET EVERY NIGHT AT BEDTIME 30 tablet 1  . donepezil (ARICEPT) 10 MG tablet Take 1 tablet (10 mg total) by mouth at bedtime. 30 tablet 5  . meloxicam (MOBIC) 7.5 MG tablet TAKE 1 TABLET BY MOUTH EVERY DAY 30 tablet 1  . traMADol (ULTRAM) 50 MG tablet Take 50 mg by mouth every 6 (six) hours as needed.    Marland Kitchen alendronate (FOSAMAX) 70 MG tablet Take 70 mg by mouth once a week. Take with a full glass of water on an empty stomach.    . mirabegron ER (MYRBETRIQ)  50 MG TB24 tablet Take 1 tablet (50 mg total) by mouth daily. (Patient not taking: Reported on 12/14/2017) 30 tablet 5   No current facility-administered medications for this encounter.     REVIEW OF SYSTEMS:  A 10+ POINT REVIEW OF SYSTEMS WAS OBTAINED including neurology, dermatology, psychiatry, cardiac, respiratory, lymph, extremities, GI, GU, musculoskeletal, constitutional,  reproductive, HEENT. All pertinent positives are noted in the HPI. All others are negative.    PHYSICAL EXAM:  height is '5\' 6"'$  (1.676 m) and weight is 145 lb (65.8 kg). Her oral temperature is 97.9 F (36.6 C). Her blood pressure is 138/76 and her pulse is 84. Her respiration is 20 and oxygen saturation is 99%.   General: Alert and oriented, in no acute distress HEENT: Head is normocephalic. Extraocular movements are intact. Oropharynx is clear. Neck: Neck is supple, no palpable cervical or supraclavicular lymphadenopathy. Heart: Regular in rate and rhythm with no murmurs, rubs, or gallops. Chest: Clear to auscultation bilaterally, with no rhonchi, wheezes, or rales. Abdomen: Soft, nontender, nondistended, with no rigidity or guarding. Extremities: No cyanosis or edema. Lymphatics: see Neck Exam Skin: No concerning lesions. Musculoskeletal: symmetric strength and muscle tone throughout. Neurologic: Cranial nerves II through XII are grossly intact. No obvious focalities. Speech is fluent. Coordination is intact. Psychiatric: Judgment and insight are intact. Affect is appropriate. Left breast with no palpable mass, nipple discharge, or bleeding. Right breast patient has a faint scar on the UIQ from her prior lumpectomy?. She has a palpable mass in the lateral aspect of the breast at approximately 9:30 o'clock adjacent of the areolar border. It measures approximately 1.5 x 2 cm.    ECOG = 2  0 - Asymptomatic (Fully active, able to carry on all predisease activities without restriction)  1 - Symptomatic but completely ambulatory (Restricted in physically strenuous activity but ambulatory and able to carry out work of a light or sedentary nature. For example, light housework, office work)  2 - Symptomatic, <50% in bed during the day (Ambulatory and capable of all self care but unable to carry out any work activities. Up and about more than 50% of waking hours)  3 - Symptomatic, >50% in bed,  but not bedbound (Capable of only limited self-care, confined to bed or chair 50% or more of waking hours)  4 - Bedbound (Completely disabled. Cannot carry on any self-care. Totally confined to bed or chair)  5 - Death   Eustace Pen MM, Creech RH, Tormey DC, et al. [phone]). "Toxicity and response criteria of the [facility]". [place] Oncol. 5 (6): 649-55  LABORATORY DATA:  Lab Results  Component Value Date   WBC 3.3 (L) 03/31/2017   HGB 13.7 03/31/2017   HCT 40.8 03/31/2017   MCV 91.7 03/31/2017   PLT 221 03/31/2017   NEUTROABS 1,518 03/31/2017   Lab Results  Component Value Date   NA 139 03/31/2017   K 3.9 03/31/2017   CL 103 03/31/2017   CO2 29 03/31/2017   GLUCOSE 120 (H) 03/31/2017   CREATININE 0.85 03/31/2017   CALCIUM 9.6 03/31/2017      RADIOGRAPHY: Mm Clip Placement Right  Result Date: 11/17/2017 CLINICAL DATA:  Status post ultrasound-guided biopsy of a RIGHT breast mass at the 9:30 o'clock axis. EXAM: DIAGNOSTIC RIGHT MAMMOGRAM POST ULTRASOUND BIOPSY COMPARISON:  Previous exam(s). FINDINGS: Mammographic images were obtained following ultrasound guided biopsy of the RIGHT breast mass at the 9:30 o'clock axis. Two ribbon shaped clips were placed at the biopsy  site (it was unclear if the first clip deployed, therefore, a second clip was placed). IMPRESSION: Two ribbon shaped biopsy clips are well-positioned within the targeted mass in the RIGHT breast at the 9:30 o'clock axis. Final Assessment: Post Procedure Mammograms for Marker Placement Electronically Signed   By: Franki Cabot M.D.   On: 11/17/2017 14:34   [place] Rt Breast Bx W Loc Dev 1st Lesion Img Bx Spec US Guide  Addendum Date: 11/22/2017   ADDENDUM REPORT: 11/21/2017 07:57 ADDENDUM: Pathology revealed GRADE II-III INVASIVE MAMMARY CARCINOMA of the Right breast, 9:30 o'clock. This was found to be concordant by Dr. Franki Cabot. Pathology results were discussed with the patient's daughter, Dezyrae Kensinger by telephone, per patient request. The patient's daughter reported her mother did well after the biopsy with tenderness at the site. Post biopsy instructions and care were reviewed and questions were answered. The patient's daughter was encouraged to call [facility] for any additional concerns. Surgical consultation has been arranged with Dr. Stark Klein at [facility] on November 22, 2017. Pathology results reported by Terie Purser, RN on 11/21/2017. Electronically Signed   By: Franki Cabot M.D.   On: 11/21/2017 07:57   Result Date: 11/22/2017 CLINICAL DATA:  Patient with a RIGHT breast mass presents today for ultrasound-guided core biopsy. EXAM: ULTRASOUND GUIDED RIGHT BREAST CORE NEEDLE BIOPSY COMPARISON:  Previous exam(s). PROCEDURE: I met with the patient and we discussed the procedure of ultrasound-guided biopsy, including benefits and alternatives. We discussed the high likelihood of a successful procedure. We discussed the risks of the procedure including infection, bleeding, tissue injury, clip migration, and inadequate sampling. Informed written consent was given. The usual time-out protocol was performed immediately prior to the procedure. Lesion quadrant: Upper outer quadrant Using sterile technique and 1% Lidocaine as local anesthetic, under direct ultrasound visualization, a 12 gauge spring-loaded device was used to perform biopsy of the RIGHT breast mass at the 9:30 o'clock axisusing a lateral approach. At the conclusion of the procedure, a ribbon shaped tissue marker clip was deployed into the biopsy cavity. It was not clear if the initial clip deployed, therefore, a second ribbon shaped tissue marker clip was placed into the mass. Follow-up 2-view mammogram was performed and dictated separately. IMPRESSION: Ultrasound-guided biopsy of the RIGHT breast mass at the 9:30 o'clock axis. No apparent complications. Electronically Signed: By: Franki Cabot M.D. On: 11/17/2017 14:21      IMPRESSION: Grade II-III invasive mammary carcinoma of the right breast, clinical stage 1 (T1c, N0)  Patient has a remote history of right lumpectomy, but possibly in a different location in the breast per her lumpectomy scar, however imaging suggests this is at her previous lumpectomy site This may be a second breast primary. The pt and daughter confirmed that she has not had radiation to her right breast. Pt does have a significant dementia, I am unsure if she would be able to hold still for daily radiation treatments. Depending on final pathologic characteristics she may potentially do well without radiation therapy as long as she proceeds with adjuvant hormonal therapy.   Today, I talked to the patient and daughter about the findings and work-up thus far.  We discussed the natural history of invasive mammary carcinoma and general treatment, highlighting the role of radiotherapy in the management.  We discussed the available radiation techniques, and focused on the details of logistics and delivery.  We reviewed the anticipated acute and late sequelae associated with radiation in  this setting.  The patient was encouraged to ask questions that I answered to the best of my ability.    PLAN: The plan is lumpectomy surgery occuring on 12/30/17 with Dr. Barry Dienes. Patient will be seen approximately 2 weeks later for evaluation. I did mention possible mastectomy with the patient and she does not wish to proceed with this treatment approach if at all possible.   ------------------------------------------------  Blair Promise, PhD, MD  This document serves as a record of services personally performed by Gery Pray, MD. It was created on his behalf by Mary-Margaret Loma Messing, a trained medical scribe. The creation of this record is based on the scribe's personal observations and the provider's statements to them. This document has been checked and approved by the  attending provider.

## 2017-12-14 NOTE — Progress Notes (Signed)
Location of Breast Cancer: GRADE II-III INVASIVE MAMMARY CARCINOMA of the Right breast, 9:30 o'clock  Histology per Pathology Report: 11/17/17:  Diagnosis Breast, right, needle core biopsy, 9:30 o'clock - INVASIVE MAMMARY CARCINOMA, GRADE 2-3. SEE NOTE Immunostain for E-cadherin is positive, consistent with a ductal phenotype.  Receptor Status: ER(100%), PR (0%), Her2-neu (negative 1+), Ki-(8%)  Did patient present with symptoms (if so, please note symptoms) or was this found on screening mammography?: Previous lumpectomy many years ago. Apparent recurrence at the lumpectomy site seen at an outside institution June 23, 2016. Biopsy was recommended but not performed.  Past/Anticipated interventions by surgeon, if any: 12/30/17:  RIGHT BREAST LUMPECTOMY WITH SENTINEL LYMPH NODE BX ERAS PATHWAY Jocelyn Klein, MD  Past/Anticipated interventions by medical oncology, if any: Chemotherapy None at this time.  Lymphedema issues, if any:  Pt has not had surgery at this time.  Pain issues, if any:  Pt c/o pain in LEFT breast, but not in affected RIGHT breast. When pt c/o pain in LEFT breast, pt belches deeply and frequently. Pt also has bilateral knee pain R/T arthritis.   SAFETY ISSUES:  Prior radiation? No  Pacemaker/ICD? No  Possible current pregnancy? N/A, pt is post-menopausal Is the patient on methotrexate? No  Current Complaints / other details:  Pt presents today for initial consult with Dr. Sondra Mccann for Radiation Oncology. Pt is accompanied by daughter. Pt with dementia and daughter provides answers to interview questions.   BP 138/76 (BP Location: Left Arm, Patient Position: Sitting)   Pulse 84   Temp 97.9 F (36.6 C) (Oral)   Resp 20   Ht _0  (1.676 m)   Wt 145 lb (65.8 kg)   SpO2 99%   BMI 23.40 kg/m   Wt Readings from Last 3 Encounters:  12/14/17 145 lb (65.8 kg)  08/08/17 138 lb 6.4 oz (62.8 kg)  05/04/17 142 lb 3.2 oz (64.5 kg)   Jocelyn Sousa, RN BSN       Jocelyn Sousa, RN 12/14/2017,2:07 PM

## 2017-12-15 ENCOUNTER — Encounter: Payer: Self-pay | Admitting: General Practice

## 2017-12-15 NOTE — Progress Notes (Signed)
[place] Psychosocial Distress Screening Clinical Social Work  Clinical Social Work was referred by distress screening protocol.  The patient scored a 6 on the Psychosocial Distress Thermometer which indicates moderate distress. Clinical Social Worker contacted patient by phone to assess for distress and other psychosocial needs. Called, per daughter she was unable to talk at this time.  CSW will mail information packet to family for their review, encouraged to call as needed for help/support.    ON[facility] DISTRESS SCREENING 12/14/2017  Screening Type Initial Screening  Distress experienced in past week (1-10) 6  Emotional problem type Depression;Nervousness/Anxiety;Feeling hopeless;Boredom  Information Concerns Type Lack of info about diagnosis;Lack of info about treatment;Lack of info about complementary therapy choices  Physical Problem type Pain;Sleep/insomnia;Getting around;Changes in urination  Other [phone] daughter [name]     Clinical Social Worker follow up needed: No.  If yes, follow up plan:  Beverely Pace, [place], LCSW Clinical Social Worker Phone:  [phone]

## 2017-12-19 NOTE — Progress Notes (Signed)
[facility]  [phone] [phone]     ID: Jocelyn Mccann DOB: [birth year]  MR#: [id number]  DUK#:[id number]  Patient Care Team: Rennis Golden as PCP - General (Physician Assistant) Quanita Barona, Virgie Dad, MD as Consulting Physician (Oncology) Yamilee Harmes, Virgie Dad, MD as Consulting Physician (Oncology) Susa Day, MD as Consulting Physician (Orthopedic Surgery) Stark Klein, MD as Consulting Physician (General Surgery) OTHER MD:   CHIEF COMPLAINT: Estrogen receptor positive breast cancer   CURRENT TREATMENT: Waiting definitive surgery   HISTORY OF CURRENT ILLNESS: Jocelyn Mccann has a history of right-sided breast cancer status in 2001 post prior surgery.  We do not have any pathology from that procedure and the family has very little information but there seems to have been no adjuvant radiation, no chemotherapy, and no adjuvant antiestrogens  More recently (?  June 2019) she had low dose bilateral low-dose screening mammography at radiology associates in [place], [place], showing an area of increased nodularity at the site of prior surgery.  This area measured 1.3 cm and was in the upper outer central right breast.  This was read as suspicious for recurrent disease.    She underwent unilateral right diagnostic mammography with tomography and [facility] showing: Breast Density Category B.  On mammography there was a spiculated mass at the prior right lumpectomy site, which appeared unchanged.  On physical exam, no suspicious lumps are identified. On ultrasound, a right breast mass is found at 9:30, 1 cm from the nipple measuring 1.4 x 1.3 x 1.9 cm vs the 1.0 x 1.0 x 1.4 cm previously seen. There is no axillary adenopathy by ultrasonography.   Accordingly on 11/17/2017 she proceeded to biopsy of the right breast area in question. The pathology from this procedure showed ([id number]):  Invasive mammary carcinoma, E-cadherin positive (and therefore ductal), grade 2 or 3, estrogen receptor 100 percent positive, with strong staining intensity, progesterone receptor negative, proliferation marker Ki67 at 8%. HER2 negative by immunohistochemistry (1+).   The patient's subsequent history is as detailed below.   INTERVAL HISTORY: [name] was evaluated in the breast cancer clinic on 12/19/2017 accompanied by her daughter, [name].  Her case was also presented at the multidisciplinary breast cancer conference 11/23/2017 at which time a preliminary plan was proposed: Breast conserving surgery, antiestrogens; she was not felt to be an MRI candidate because of dementia.   REVIEW OF SYSTEMS: Jocelyn Mccann is doing well overall.  She is aware of the right breast mass and it does concern her.  However what she actually complains about is pain behind the left breast.  That pain is pleuritic.  When she takes a deep breath it hurts in the left chest wall.  She denies any unusual cough, hemoptysis, phlegm production, fever, or shortness of breath.  She does not exercise. The patient denies unusual headaches, visual changes, nausea, vomiting, stiff neck, dizziness, or gait imbalance. There has been no change in bowel or bladder habits. The patient denies fever, rash, bleeding, unexplained fatigue or unexplained weight loss. A detailed review of systems was otherwise entirely negative.   PAST MEDICAL HISTORY: Past Medical History:  Diagnosis Date  . Bilateral knee pain   . Breast cancer ([place])    diagnosed 11/2017  . Dementia ([place])   . Osteoporosis   . Urge incontinence of urine      PAST SURGICAL HISTORY: Past Surgical History:  Procedure Laterality Date  . BREAST LUMPECTOMY WITH SENTINEL  LYMPH NODE BIOPSY Right 2001  . DENTAL SURGERY       FAMILY HISTORY Family History  Problem Relation Age of Onset  . Cancer Mother        breast    She notes that her father died from choking  in his 32's. Patients' mother died from breast cancer at age 6. The patient has 4 brothers and 3 sisters. Patient denies any other family members with breast cancer or anyone in her family having ovarian cancer.    GYNECOLOGIC HISTORY:  Menarche: 84 years old Age at first live birth: 84 years old GX P: 3 LMP: No LMP recorded. Patient is postmenopausal. 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  She is originally from [place] and came to the states in [place].  She is widowed. She has three children. She lives with her daughter, [name], who is a stay at home mother. [name] is married to [place] who is an Education officer, museum at [employer]. [name] and Jimo have two children, Mariama and [name]. [name] is 84 years old and is going to [employer] for [employer]. [name] is 84 years old and will be graduating from high school next year. [place] attends a [employer].    ADVANCED DIRECTIVES: Her daughter, [name], is Jocelyn Mccann's medical power of attorney.    HEALTH MAINTENANCE: Social History   Tobacco Use  . Smoking status: Never Smoker  . Smokeless tobacco: Never Used  Substance Use Topics  . Alcohol use: Never    Frequency: Never  . Drug use: Never     Colonoscopy: Patient does not recall  PAP:   Bone density: History of osteoporosis   No Known Allergies  Current Outpatient Medications  Medication Sig Dispense Refill  . traMADol (ULTRAM) 50 MG tablet Take 50 mg by mouth every 6 (six) hours as needed for moderate pain.     . busPIRone (BUSPAR) 7.5 MG tablet TAKE 1 TABLET BY MOUTH EVERY DAY (Patient not taking: No sig reported) 30 tablet 1  . citalopram (CELEXA) 20 MG tablet TAKE 1 TABLET EVERY NIGHT AT BEDTIME (Patient not taking: No sig reported) 30 tablet 1  . donepezil (ARICEPT) 5 MG tablet Take 5 mg by mouth every 30 (thirty) days.    . meloxicam (MOBIC) 7.5 MG  tablet TAKE 1 TABLET BY MOUTH EVERY DAY (Patient not taking: No sig reported) 30 tablet 1  . mirabegron ER (MYRBETRIQ) 50 MG TB24 tablet Take 1 tablet (50 mg total) by mouth daily. (Patient not taking: Reported on 12/20/2017) 30 tablet 5   No current facility-administered medications for this visit.      OBJECTIVE: Elderly [place] woman who appears younger than stated age  43:   12/20/17 1605  BP: (!) 168/79  Pulse: 90  Resp: 18  Temp: 98.2 F (36.8 C)  SpO2: 100%     Body mass index is 24.96 kg/m.   Wt Readings from Last 3 Encounters:  12/20/17 150 lb (68 kg)  12/20/17 150 lb 4.8 oz (68.2 kg)  12/14/17 145 lb (65.8 kg)      ECOG FS:2 - Symptomatic, <50% confined to bed  Ocular: Sclerae unicteric, pupils round and equal Lymphatic: No cervical or supraclavicular adenopathy Lungs no rales or rhonchi, no wheezes Heart regular rate and rhythm Abd soft, nontender, positive bowel sounds MSK no focal spinal tenderness, no joint edema Neuro: non-focal, well-oriented, appropriate affect Breasts: Both  breasts are atrophic.  In the right breast there is a palpable mass measuring approximately 2 cm centrally early.  There is no skin or nipple change of concern.  I do not palpate any abnormality in the left breast.  Both axillae are benign.   LAB RESULTS:  CMP     Component Value Date/Time   NA 139 12/20/2017 1440   K 3.8 12/20/2017 1440   CL 105 12/20/2017 1440   CO2 27 12/20/2017 1440   GLUCOSE 86 12/20/2017 1440   BUN 16 12/20/2017 1440   CREATININE 0.88 12/20/2017 1440   CREATININE 0.85 03/31/2017 1144   CALCIUM 9.2 12/20/2017 1440   PROT 7.4 03/31/2017 1144   AST 16 03/31/2017 1144   ALT 10 03/31/2017 1144   BILITOT 0.5 03/31/2017 1144   GFRNONAA 59 (L) 12/20/2017 1440   GFRNONAA 62 03/31/2017 1144   GFRAA >60 12/20/2017 1440   GFRAA 71 03/31/2017 1144    No results found for: TOTALPROTELP, ALBUMINELP, A1GS, A2GS, BETS, BETA2SER, GAMS, MSPIKE, SPEI  No  results found for: KPAFRELGTCHN, LAMBDASER, KAPLAMBRATIO  Lab Results  Component Value Date   WBC 3.3 (L) 12/20/2017   NEUTROABS 1,518 03/31/2017   HGB 12.2 12/20/2017   HCT 37.1 12/20/2017   MCV 101.6 (H) 12/20/2017   PLT 192 12/20/2017    '@LASTCHEMISTRY'$ @  No results found for: LABCA2  No components found for: ZYSAYT016  No results for input(s): INR in the last 168 hours.  No results found for: LABCA2  No results found for: WFU932  No results found for: TFT732  No results found for: KGU542  No results found for: CA2729  No components found for: HGQUANT  No results found for: CEA1 / No results found for: CEA1   No results found for: AFPTUMOR  No results found for: [place]  No results found for: [facility] Outpatient Visit on 12/20/2017  Component Date Value Ref Range Status  . Color, Urine 12/20/2017 YELLOW  YELLOW Final  . APPearance 12/20/2017 CLEAR  CLEAR Final  . Specific Gravity, Urine 12/20/2017 1.026  1.005 - 1.030 Final  . pH 12/20/2017 5.0  5.0 - 8.0 Final  . Glucose, UA 12/20/2017 NEGATIVE  NEGATIVE mg/dL Final  . Hgb urine dipstick 12/20/2017 NEGATIVE  NEGATIVE Final  . Bilirubin Urine 12/20/2017 NEGATIVE  NEGATIVE Final  . Ketones, ur 12/20/2017 NEGATIVE  NEGATIVE mg/dL Final  . Protein, ur 12/20/2017 NEGATIVE  NEGATIVE mg/dL Final  . Nitrite 12/20/2017 NEGATIVE  NEGATIVE Final  . Leukocytes, UA 12/20/2017 NEGATIVE  NEGATIVE Final   Performed at [facility], [place] [address] [place], [place] [address]  . Sodium 12/20/2017 139  135 - 145 mmol/L Final  . Potassium 12/20/2017 3.8  3.5 - 5.1 mmol/L Final  . Chloride 12/20/2017 105  98 - 111 mmol/L Final  . CO2 12/20/2017 27  22 - 32 mmol/L Final  . Glucose, Bld 12/20/2017 86  70 - 99 mg/dL Final  . BUN 12/20/2017 16  8 - 23 mg/dL Final  . Creatinine, Ser 12/20/2017 0.88  0.44 - 1.00 mg/dL Final  . Calcium 12/20/2017 9.2  8.9 - 10.3 mg/dL Final  . GFR calc non Af Amer 12/20/2017 59*  >60 mL/min Final  . GFR calc Af Amer 12/20/2017 >60  >60 mL/min Final  . Anion gap 12/20/2017 7  5 - 15 Final   Performed at [facility], [place] [address], [place],  [address]  . WBC 12/20/2017 3.3* 4.0 -  10.5 K/uL Final  . RBC 12/20/2017 3.65* 3.87 - 5.11 MIL/uL Final  . Hemoglobin 12/20/2017 12.2  12.0 - 15.0 g/dL Final  . HCT 12/20/2017 37.1  36.0 - 46.0 % Final  . MCV 12/20/2017 101.6* 80.0 - 100.0 fL Final  . MCH 12/20/2017 33.4  26.0 - 34.0 pg Final  . MCHC 12/20/2017 32.9  30.0 - 36.0 g/dL Final  . RDW 12/20/2017 13.6  11.5 - 15.5 % Final  . Platelets 12/20/2017 192  150 - 400 K/uL Final  . nRBC 12/20/2017 0.0  0.0 - 0.2 % Final   Performed at [facility], [place] [address] [place], [place] [address]    (this displays the last labs from the last 3 days)  No results found for: TOTALPROTELP, ALBUMINELP, A1GS, A2GS, BETS, BETA2SER, GAMS, MSPIKE, SPEI (this displays SPEP labs)  No results found for: KPAFRELGTCHN, LAMBDASER, KAPLAMBRATIO (kappa/lambda light chains)  No results found for: HGBA, HGBA2QUANT, HGBFQUANT, HGBSQUAN (Hemoglobinopathy evaluation)   No results found for: LDH  No results found for: IRON, TIBC, IRONPCTSAT (Iron and TIBC)  No results found for: FERRITIN  Urinalysis    Component Value Date/Time   COLORURINE YELLOW 12/20/2017 [place] 12/20/2017 1445   LABSPEC 1.026 12/20/2017 1445   PHURINE 5.0 12/20/2017 1445   GLUCOSEU NEGATIVE 12/20/2017 [place] 12/20/2017 1445   BILIRUBINUR NEGATIVE 12/20/2017 [place] 12/20/2017 [place] 12/20/2017 1445   NITRITE NEGATIVE 12/20/2017 [place] 12/20/2017 1445     STUDIES:   Mammography and pathology results discussed with the patient  ELIGIBLE FOR AVAILABLE RESEARCH PROTOCOL:    ASSESSMENT: 84 y.o. [place] woman s/p right upper outer quadrant biopsy 11/17/2017 for a clinical T1c N0, stage IA  invasive ductal carcinoma, grade 2 or 3, estrogen receptor positive, progesterone receptor negative, HER-2 not amplified, with an MIB-1 of 8%  (1) prior history of right-sided breast cancer, status post lumpectomy  (2) breast conserving surgery scheduled for 12/30/2017  (3) patient and family state they would prefer to avoid radiation if at all possible  (4) to start anastrozole at the completion of local treatment   PLAN: I spent approximately 60 minutes face to face with Theophilus Kinds with more than 50% of that time spent in counseling and coordination of care. Specifically we reviewed the biology of the patient's diagnosis and the specifics of her situation.  We first reviewed the fact that cancer is not one disease but more than 100 different diseases and that it is important to keep them separate-- otherwise when friends and relatives discuss their own cancer experiences with Cyani confusion can result. Similarly we explained that if breast cancer spreads to the bone or liver, the patient would not have bone cancer or liver cancer, but breast cancer in the bone and breast cancer in the liver: one cancer in three places-- not 3 different cancers which otherwise would have to be treated in 3 different ways.  We discussed the difference between local and systemic therapy. In terms of loco-regional treatment, lumpectomy plus radiation is equivalent to mastectomy as far as survival is concerned. For this reason, and because the cosmetic results are generally superior, we recommend breast conserving surgery.   As far as we know the patient did not receive prior radiation, which otherwise would mandate mastectomy.  In any case, however, she is interested in avoiding radiation if at all possible and we do have data  for stage I estrogen receptor positive cases in women older than 70 willing to take antiestrogens: In those cases adjuvant radiation may be bypassed assuming clear margins were obtained.  This does  not affect survival.  There will be a slightly higher risk of local recurrence.  We then discussed the rationale for systemic therapy. There is some risk that this cancer may have already spread to other parts of her body.  Normally we counsel patients against scans in stage I cases, but Amazin has a persistent bothersome pain behind the left breast, not the right, which has a pleuritic component, and this does need to be evaluated.  Accordingly I am setting her up for a CT scan of the chest next week, hopefully to be done before her surgery.  Next we went over the options for systemic therapy which are anti-estrogens, anti-HER-2 immunotherapy, and chemotherapy. Shaquila does not meet criteria for anti-HER-2 immunotherapy. She is a good candidate for anti-estrogens.  The question of chemotherapy is more complicated. Chemotherapy is most effective in rapidly growing, very aggressive tumors. It is much less effective in slow growing cancers, like Zemirah 's. For that reason and taking the stage in patients age into consideration we are not sending an Oncotype in this case but plan to bypass chemotherapy and treat only with antiestrogens.  Accordingly at this point the plan is for breast conserving surgery, consideration of radiation, and proceeding with anastrozole.  The patient will have a staging CT scan of the chest.  Once she returns to see me postop we will not only start anastrozole but very likely also denosumab/Prolia.  Karessa has a good understanding of the overall plan. She agrees with it. She knows the goal of treatment in her case is cure. She will call with any problems that may develop before her next visit here.   Gladyes Kudo, Virgie Dad, MD  12/20/17 5:13 PM Medical Oncology and Hematology [facility] [address] [place], [place] [address] Tel. [phone]    Fax. [phone]    I, Jacqualyn Posey am acting as a Education administrator for Chauncey Cruel, MD.   I, Lurline Del MD,  have reviewed the above documentation for accuracy and completeness, and I agree with the above.

## 2017-12-19 NOTE — Pre-Procedure Instructions (Signed)
SAMARA STANKOWSKI  12/19/2017      [id number] [place], [place] - [address] [place] [address] [place] [address] Phone: [phone] Fax: [phone]    Your procedure is scheduled on Friday December 20th.  Report to [facility] Admitting at 6:30 A.M.  Call this number if you have problems the morning of surgery:  [phone]   Remember:  Do not eat after midnight.  You may drink clear liquids until 5:30 A.M .  Clear liquids allowed are: Water, Juice (non-citric and without pulp), Carbonated beverages, Clear Tea, Black Coffee only and Gatorade    Take these medicines the morning of surgery with A SIP OF WATER  busPIRone (BUSPAR)  traMADol (ULTRAM)  7 days prior to surgery STOP taking any Aspirin(unless otherwise instructed by your surgeon), Aleve, Naproxen, Ibuprofen, Motrin, Advil, Goody's, BC's, all herbal medications, fish oil, and all vitamins. Including: Meloxicam (MOBIC)     Do not wear jewelry, make-up or nail polish.  Do not wear lotions, powders, or perfumes, or deodorant.  Do not shave 48 hours prior to surgery.   Do not bring valuables to the hospital.  [facility] is not responsible for any belongings or valuables.  Contacts, dentures or bridgework may not be worn into surgery.  Leave your suitcase in the car.  After surgery it may be brought to your room.  For patients admitted to the hospital, discharge time will be determined by your treatment team.  Patients discharged the day of surgery will not be allowed to drive home.   Special instructions:   [place]- Preparing For Surgery  Before surgery, you can play an important role. Because skin is not sterile, your skin needs to be as free of germs as possible. You can reduce the number of germs on your skin by washing with CHG (chlorahexidine gluconate) Soap before surgery.  CHG is an antiseptic cleaner which kills germs and bonds with the skin to  continue killing germs even after washing.    Oral Hygiene is also important to reduce your risk of infection.  Remember - BRUSH YOUR TEETH THE MORNING OF SURGERY WITH YOUR REGULAR TOOTHPASTE  Please do not use if you have an allergy to CHG or antibacterial soaps. If your skin becomes reddened/irritated stop using the CHG.  Do not shave (including legs and underarms) for at least 48 hours prior to first CHG shower. It is OK to shave your face.  Please follow these instructions carefully.   1. Shower the NIGHT BEFORE SURGERY and the MORNING OF SURGERY with CHG.   2. If you chose to wash your hair, wash your hair first as usual with your normal shampoo.  3. After you shampoo, rinse your hair and body thoroughly to remove the shampoo.  4. Use CHG as you would any other liquid soap. You can apply CHG directly to the skin and wash gently with a scrungie or a clean washcloth.   5. Apply the CHG Soap to your body ONLY FROM THE NECK DOWN.  Do not use on open wounds or open sores. Avoid contact with your eyes, ears, mouth and genitals (private parts). Wash Face and genitals (private parts)  with your normal soap.  6. Wash thoroughly, paying special attention to the area where your surgery will be performed.  7. Thoroughly rinse your body with warm water from the neck down.  8. DO NOT shower/wash with your normal soap  after using and rinsing off the CHG Soap.  9. Pat yourself dry with a CLEAN TOWEL.  10. Wear CLEAN PAJAMAS to bed the night before surgery, wear comfortable clothes the morning of surgery  11. Place CLEAN SHEETS on your bed the night of your first shower and DO NOT SLEEP WITH PETS.    Day of Surgery:  Do not apply any deodorants/lotions.  Please wear clean clothes to the hospital/surgery center.   Remember to brush your teeth WITH YOUR REGULAR TOOTHPASTE.   Please read over the following fact sheets that you were given.

## 2017-12-20 ENCOUNTER — Other Ambulatory Visit: Payer: Self-pay

## 2017-12-20 ENCOUNTER — Encounter (HOSPITAL_COMMUNITY)
Admission: RE | Admit: 2017-12-20 | Discharge: 2017-12-20 | Disposition: A | Discharge status: Home / Self Care | Payer: Medicare HMO | Source: Ambulatory Visit | Attending: General Surgery | Admitting: General Surgery

## 2017-12-20 ENCOUNTER — Inpatient Hospital Stay: Payer: Medicare HMO | Attending: Oncology | Admitting: Oncology

## 2017-12-20 ENCOUNTER — Encounter: Payer: Self-pay | Admitting: Oncology

## 2017-12-20 ENCOUNTER — Encounter (HOSPITAL_COMMUNITY): Payer: Self-pay

## 2017-12-20 VITALS — BP 168/79 | HR 90 | Temp 98.2°F | Resp 18 | Ht 65.0 in | Wt 150.0 lb

## 2017-12-20 DIAGNOSIS — M818 Other osteoporosis without current pathological fracture: Secondary | ICD-10-CM

## 2017-12-20 DIAGNOSIS — F0390 Unspecified dementia without behavioral disturbance: Secondary | ICD-10-CM

## 2017-12-20 DIAGNOSIS — Z7984 Long term (current) use of oral hypoglycemic drugs: Secondary | ICD-10-CM | POA: Diagnosis not present

## 2017-12-20 DIAGNOSIS — Z803 Family history of malignant neoplasm of breast: Secondary | ICD-10-CM

## 2017-12-20 DIAGNOSIS — Z79811 Long term (current) use of aromatase inhibitors: Secondary | ICD-10-CM | POA: Insufficient documentation

## 2017-12-20 DIAGNOSIS — Z79899 Other long term (current) drug therapy: Secondary | ICD-10-CM

## 2017-12-20 DIAGNOSIS — Z170 Estrogen receptor positive status [ER+]: Secondary | ICD-10-CM | POA: Diagnosis not present

## 2017-12-20 DIAGNOSIS — Z01812 Encounter for preprocedural laboratory examination: Secondary | ICD-10-CM | POA: Diagnosis not present

## 2017-12-20 DIAGNOSIS — C50411 Malignant neoplasm of upper-outer quadrant of right female breast: Secondary | ICD-10-CM | POA: Diagnosis not present

## 2017-12-20 LAB — CBC
HCT: 37.1 % (ref 36.0–46.0)
HEMOGLOBIN: 12.2 g/dL (ref 12.0–15.0)
MCH: 33.4 pg (ref 26.0–34.0)
MCHC: 32.9 g/dL (ref 30.0–36.0)
MCV: 101.6 fL — ABNORMAL HIGH (ref 80.0–100.0)
PLATELETS: 192 10*3/uL (ref 150–400)
RBC: 3.65 MIL/uL — ABNORMAL LOW (ref 3.87–5.11)
RDW: 13.6 % (ref 11.5–15.5)
WBC: 3.3 10*3/uL — ABNORMAL LOW (ref 4.0–10.5)
nRBC: 0 % (ref 0.0–0.2)

## 2017-12-20 LAB — BASIC METABOLIC PANEL
Anion gap: 7 (ref 5–15)
BUN: 16 mg/dL (ref 8–23)
CALCIUM: 9.2 mg/dL (ref 8.9–10.3)
CO2: 27 mmol/L (ref 22–32)
CREATININE: 0.88 mg/dL (ref 0.44–1.00)
Chloride: 105 mmol/L (ref 98–111)
GFR calc Af Amer: >60 mL/min (ref >60)
GFR calc non Af Amer: 59 mL/min — ABNORMAL LOW (ref >60)
GLUCOSE: 86 mg/dL (ref 70–99)
Potassium: 3.8 mmol/L (ref 3.5–5.1)
Sodium: 139 mmol/L (ref 135–145)

## 2017-12-20 LAB — URINALYSIS, ROUTINE W REFLEX MICROSCOPIC
BILIRUBIN URINE: NEGATIVE
GLUCOSE, UA: NEGATIVE mg/dL
HGB URINE DIPSTICK: NEGATIVE
KETONES UR: NEGATIVE mg/dL
Leukocytes, UA: NEGATIVE
Nitrite: NEGATIVE
PROTEIN: NEGATIVE mg/dL
SPECIFIC GRAVITY, URINE: 1.026 (ref 1.005–1.030)
pH: 5 (ref 5.0–8.0)

## 2017-12-20 NOTE — Progress Notes (Signed)
PCP - Dena Billet, PA-C-[facility] Cardiologist - denies  Chest x-ray - N/A EKG - 09/20/17 Stress Test - denies ECHO - denies Cardiac Cath - denies  Sleep Study - denies  Aspirin Instructions: N/A  Anesthesia review: No  Patient denies shortness of breath, fever, cough and chest pain at PAT appointment   Patient verbalized understanding of instructions that were given to them at the PAT appointment. Patient was also instructed that they will need to review over the PAT instructions again at home before surgery.

## 2017-12-21 ENCOUNTER — Telehealth: Payer: Self-pay | Admitting: Oncology

## 2017-12-21 ENCOUNTER — Encounter: Payer: Self-pay | Admitting: *Deleted

## 2017-12-21 NOTE — Telephone Encounter (Signed)
Per 12/10 no los °

## 2017-12-21 NOTE — Telephone Encounter (Signed)
Scheduled appt per 12/11 sch message - left message for patient with appt date and time   

## 2017-12-22 ENCOUNTER — Other Ambulatory Visit (HOSPITAL_COMMUNITY): Payer: Medicare HMO

## 2017-12-27 NOTE — H&P (Signed)
[place] Location: [facility] Patient #: [id number] DOB: 1928/04/09 Widowed / Language: Cleophus Molt / Race: Black or African American Female   History of Present Illness The patient is a 84 year old female who presents with breast cancer. Pt is an 84 yo F who is referred by Dr. Enriqueta Shutter for new diagnosis of right breast cancer 11/2017. The patient went for a mammogram and surprised her daughter by telling the staff that she had a mass. diagnostic imaging was performed which showed a 1.9 cm mass at 9:30, spiculated. No LAD was seen. Core needle biopsy was performed which showed invasive mammary carcinoma, ER+/PR -, Her2 -, Ki 67 8%. The e cadherin was not yet available. The patient has knee arthritis and some dementia. She lives with her daughter that is home full time, and is here today with this daughter and with her son. She is remotely from [place].   Her mother had breast cancer in her 2s. She had a prior breast surgery in the right breast at age 39.   dx mammo/us 11/14/17 ACR Breast Density Category b: There are scattered areas of fibroglandular density.  FINDINGS: There is a spiculated mass at the right lumpectomy site which is similar mammographically in the interval. No other suspicious findings seen in either breast.  Mammographic images were processed with CAD.  On physical exam, no suspicious lumps are identified.  Targeted ultrasound is performed, showing a mass in the right breast at 9:30, 1 cm from the nipple measuring 1.4 x 1.3 x 1.9 cm today versus 1.0 x 1.0 x 1.4 cm previously. No axillary adenopathy.  IMPRESSION: Growing suspicious right breast mass at 9:30, 1 cm from the nipple.  RECOMMENDATION: Ultrasound-guided biopsy of the growing suspicious right breast mass.  I have discussed the findings and recommendations with the patient. Results were also provided in writing at the conclusion of the visit. If applicable, a reminder letter  will be sent to the patient regarding the next appointment.  BI-RADS CATEGORY 5: Highly suggestive of malignancy.  pathology core needle bx 11/17/2017 Diagnosis Breast, right, needle core biopsy, 9:30 o'clock - INVASIVE MAMMARY CARCINOMA, GRADE 2-3. SEE NOTE The tumor cells are NEGATIVE for Her2 (1+). Estrogen Receptor: 100%, POSITIVE, STRONG STAINING INTENSITY Progesterone Receptor: 0%, NEGATIVE Proliferation Marker Ki67: 8%   Diagnostic Studies History  Colonoscopy  never Mammogram  within last year Pap Smear  >5 years ago  Allergies No Known Drug Allergies Allergies Reconciled   Medication History  No Current Medications Medications Reconciled  Social History No alcohol use  No caffeine use  No drug use  Tobacco use  Never smoker.  Family History  Diabetes Mellitus  Daughter.  Pregnancy / Birth History Age of menopause  [age] Irregular periods   Other Problems  Arthritis  Breast Cancer  Lump In Breast     Review of Systems General Not Present- Appetite Loss, Chills, Fatigue, Fever, Night Sweats, Weight Gain and Weight Loss. Skin Not Present- Change in Wart/Mole, Dryness, Hives, Jaundice, New Lesions, Non-Healing Wounds, Rash and Ulcer. HEENT Not Present- Earache, Hearing Loss, Hoarseness, Nose Bleed, Oral Ulcers, Ringing in the Ears, Seasonal Allergies, Sinus Pain, Sore Throat, Visual Disturbances, Wears glasses/contact lenses and Yellow Eyes. Breast Present- Breast Mass. Not Present- Breast Pain, Nipple Discharge and Skin Changes. Female Genitourinary Not Present- Frequency, Nocturia, Painful Urination, Pelvic Pain and Urgency. Hematology Not Present- Blood Thinners, Easy Bruising, Excessive bleeding, Gland problems, HIV and Persistent Infections. All other systems negative  Vitals  Weight:  146.4 lb Height: 66in Body Surface Area: 1.75 m Body Mass Index: 23.63 kg/m  Temp.: 53F  Pulse: 97 (Regular)  BP: 134/82 (Sitting,  Left Arm, Standard)       Physical Exam General Mental Status-Alert. General Appearance-Consistent with stated age. Hydration-Well hydrated. Voice-Normal. Note: quite thin   Head and Neck Head-normocephalic, atraumatic with no lesions or palpable masses.  Eye Sclera/Conjunctiva - Bilateral-No scleral icterus.  Chest and Lung Exam Chest and lung exam reveals -quiet, even and easy respiratory effort with no use of accessory muscles. Inspection Chest Wall - Normal. Back - normal.  Breast Note: ptotic bilaterally. skin dimpling laterally on right at site of palpable mass. no LAD. no nipple discharge. left breast wtihotu abnormalities.   Cardiovascular Cardiovascular examination reveals -normal pedal pulses bilaterally. Note: regular rate and rhythm  Abdomen Inspection Inspection of the abdomen reveals - Note: midline incision well healed. Palpation/Percussion Palpation and Percussion of the abdomen reveal - Soft, Non Tender, No Rebound tenderness, No Rigidity (guarding) and No hepatosplenomegaly.  Peripheral Vascular Upper Extremity Inspection - Bilateral - Normal - No Clubbing, No Cyanosis, No Edema, Pulses Intact. Lower Extremity Palpation - Edema - Bilateral - No edema.  Neurologic Neurologic evaluation reveals -alert and oriented x 3 with no impairment of recent or remote memory. Mental Status-Normal.  Neuropsychiatric Note: repetitive questioning. Oriented to person and place. ? situation.   Musculoskeletal Global Assessment -Note: no gross deformities.  Normal Exam - Left-Upper Extremity Strength Normal and Lower Extremity Strength Normal. Normal Exam - Right-Upper Extremity Strength Normal and Lower Extremity Strength Normal.  Lymphatic Head & Neck  General Head & Neck Lymphatics: Bilateral - Description - Normal. Axillary  General Axillary Region: Bilateral - Description - Normal. Tenderness - Non  Tender.    Assessment & Plan MALIGNANT NEOPLASM OF UPPER-OUTER QUADRANT OF RIGHT BREAST IN FEMALE, ESTROGEN RECEPTOR POSITIVE (C50.411) Impression: This is a cT1cN0 right breast cancer. Because of the mammary nature, the timeline of the mass, and the progesterone negative status, will do SLN bx. She does not need a seed since the mass is clearly palpable.  We will plan surgery as soon as possible. I will also refer to oncology and radiation oncology. This is fine for post op.  The surgical procedure was described to the patient. I discussed the incision type and location and that we would need radiology involved on with a wire or seed marker and/or sentinel node.  The risks and benefits of the procedure were described to the patient and she wishes to proceed.  We discussed the risks bleeding, infection, damage to other structures, need for further procedures/surgeries. We discussed the risk of seroma. The patient was advised if the area in the breast in cancer, we may need to go back to surgery for additional tissue to obtain negative margins or for a lymph node biopsy. The patient was advised that these are the most common complications, but that others can occur as well. They were advised against taking aspirin or other anti-inflammatory agents/blood thinners the week before surgery. Current Plans You are being scheduled for surgery- Our schedulers will call you.  You should hear from our office's scheduling department within 5 working days about the location, date, and time of surgery. We try to make accommodations for patient's preferences in scheduling surgery, but sometimes the OR schedule or the surgeon's schedule prevents [place] from making those accommodations.  If you have not heard from our office [phone]) in 5 working days, call the office and  ask for your surgeon's nurse.  If you have other questions about your diagnosis, plan, or surgery, call the office and ask for your  surgeon's nurse.  Pt Education - [facility] breast cancer surgery: discussed with patient and provided information. Referred to Radiation Oncology, for evaluation and follow up (Radiation Oncology). Routine. Referred to Oncology, for evaluation and follow up (Oncology). Routine.

## 2017-12-28 ENCOUNTER — Telehealth: Payer: Self-pay | Admitting: *Deleted

## 2017-12-28 NOTE — Telephone Encounter (Signed)
Spoke with daughter concerning CT of chest. Gave number to central scheduling to call to get an appt. Received verbal understanding. Denies further needs or questions at this time.

## 2017-12-29 NOTE — Anesthesia Preprocedure Evaluation (Addendum)
Anesthesia Evaluation  Patient identified by MRN, date of birth, ID band Patient awake    Reviewed: Allergy & Precautions, NPO status , Patient's Chart, lab work & pertinent test results  Airway Mallampati: II  TM Distance: >3 FB Neck ROM: Full    Dental  (+) Dental Advisory Given   Pulmonary neg pulmonary ROS,    breath sounds clear to auscultation       Cardiovascular negative cardio ROS   Rhythm:Regular Rate:Normal     Neuro/Psych Dementia negative neurological ROS     GI/Hepatic negative GI ROS, Neg liver ROS,   Endo/Other  negative endocrine ROS  Renal/GU negative Renal ROS     Musculoskeletal  (+) Arthritis ,   Abdominal   Peds  Hematology negative hematology ROS (+)   Anesthesia Other Findings   Reproductive/Obstetrics                            Lab Results  Component Value Date   WBC 3.3 (L) 12/20/2017   HGB 12.2 12/20/2017   HCT 37.1 12/20/2017   MCV 101.6 (H) 12/20/2017   PLT 192 12/20/2017   Lab Results  Component Value Date   CREATININE 0.88 12/20/2017   BUN 16 12/20/2017   NA 139 12/20/2017   K 3.8 12/20/2017   CL 105 12/20/2017   CO2 27 12/20/2017    Anesthesia Physical Anesthesia Plan  ASA: II  Anesthesia Plan: General   Post-op Pain Management:  Regional for Post-op pain   Induction: Intravenous  PONV Risk Score and Plan: 3 and Dexamethasone, Ondansetron and Treatment may vary due to age or medical condition  Airway Management Planned: LMA  Additional Equipment:   Intra-op Plan:   Post-operative Plan: Extubation in OR  Informed Consent: I have reviewed the patients History and Physical, chart, labs and discussed the procedure including the risks, benefits and alternatives for the proposed anesthesia with the patient or authorized representative who has indicated his/her understanding and acceptance.   Dental advisory given  Plan Discussed  with: CRNA  Anesthesia Plan Comments:        Anesthesia Quick Evaluation

## 2017-12-30 ENCOUNTER — Ambulatory Visit (HOSPITAL_COMMUNITY): Payer: Medicare HMO | Admitting: Anesthesiology

## 2017-12-30 ENCOUNTER — Other Ambulatory Visit: Payer: Self-pay

## 2017-12-30 ENCOUNTER — Ambulatory Visit (HOSPITAL_COMMUNITY)
Admission: RE | Admit: 2017-12-30 | Discharge: 2017-12-30 | Disposition: A | Discharge status: Home / Self Care | Payer: Medicare HMO | Source: Ambulatory Visit | Attending: General Surgery | Admitting: General Surgery

## 2017-12-30 ENCOUNTER — Ambulatory Visit (HOSPITAL_COMMUNITY)
Admission: RE | Admit: 2017-12-30 | Discharge: 2017-12-30 | Disposition: A | Discharge status: Home / Self Care | Payer: Medicare HMO | Attending: General Surgery | Admitting: General Surgery

## 2017-12-30 ENCOUNTER — Encounter (HOSPITAL_COMMUNITY)
Admission: RE | Disposition: A | Discharge status: Home / Self Care | Payer: Self-pay | Source: Home / Self Care | Attending: General Surgery

## 2017-12-30 ENCOUNTER — Encounter (HOSPITAL_COMMUNITY): Payer: Self-pay | Admitting: Anesthesiology

## 2017-12-30 DIAGNOSIS — F0390 Unspecified dementia without behavioral disturbance: Secondary | ICD-10-CM | POA: Diagnosis not present

## 2017-12-30 DIAGNOSIS — Z170 Estrogen receptor positive status [ER+]: Principal | ICD-10-CM

## 2017-12-30 DIAGNOSIS — C50911 Malignant neoplasm of unspecified site of right female breast: Secondary | ICD-10-CM | POA: Diagnosis not present

## 2017-12-30 DIAGNOSIS — M1990 Unspecified osteoarthritis, unspecified site: Secondary | ICD-10-CM | POA: Insufficient documentation

## 2017-12-30 DIAGNOSIS — C50511 Malignant neoplasm of lower-outer quadrant of right female breast: Secondary | ICD-10-CM | POA: Diagnosis not present

## 2017-12-30 DIAGNOSIS — Z803 Family history of malignant neoplasm of breast: Secondary | ICD-10-CM | POA: Insufficient documentation

## 2017-12-30 DIAGNOSIS — G8918 Other acute postprocedural pain: Secondary | ICD-10-CM | POA: Diagnosis not present

## 2017-12-30 DIAGNOSIS — C50411 Malignant neoplasm of upper-outer quadrant of right female breast: Secondary | ICD-10-CM | POA: Diagnosis not present

## 2017-12-30 HISTORY — PX: BREAST LUMPECTOMY WITH SENTINEL LYMPH NODE BIOPSY: SHX5597

## 2017-12-30 SURGERY — BREAST LUMPECTOMY WITH SENTINEL LYMPH NODE BX
Anesthesia: General | Site: Breast | Laterality: Right

## 2017-12-30 MED ORDER — LACTATED RINGERS IV SOLN
INTRAVENOUS | Status: DC
  Administered 2017-12-30: 08:00:00 via INTRAVENOUS

## 2017-12-30 MED ORDER — FENTANYL CITRATE (PF) 100 MCG/2ML IJ SOLN
75.0000 ug | Freq: Once | INTRAMUSCULAR | Status: AC
  Administered 2017-12-30: 75 ug via INTRAVENOUS

## 2017-12-30 MED ORDER — METHYLENE BLUE 0.5 % INJ SOLN
INTRAVENOUS | Status: AC
  Filled 2017-12-30: qty 10

## 2017-12-30 MED ORDER — FENTANYL CITRATE (PF) 100 MCG/2ML IJ SOLN: 25.0000 ug | INTRAMUSCULAR | Status: DC | PRN

## 2017-12-30 MED ORDER — BUPIVACAINE-EPINEPHRINE (PF) 0.5% -1:200000 IJ SOLN
INTRAMUSCULAR | Status: DC | PRN
  Administered 2017-12-30: 25 mL

## 2017-12-30 MED ORDER — GABAPENTIN 300 MG PO CAPS
300.0000 mg | ORAL_CAPSULE | ORAL | Status: AC
  Administered 2017-12-30: 300 mg via ORAL
  Filled 2017-12-30: qty 1

## 2017-12-30 MED ORDER — CHLORHEXIDINE GLUCONATE CLOTH 2 % EX PADS: 6.0000 | MEDICATED_PAD | Freq: Once | CUTANEOUS | Status: DC

## 2017-12-30 MED ORDER — FENTANYL CITRATE (PF) 100 MCG/2ML IJ SOLN
INTRAMUSCULAR | Status: AC
  Administered 2017-12-30: 75 ug via INTRAVENOUS
  Filled 2017-12-30: qty 2

## 2017-12-30 MED ORDER — CELECOXIB 200 MG PO CAPS
200.0000 mg | ORAL_CAPSULE | ORAL | Status: AC
  Administered 2017-12-30: 200 mg via ORAL
  Filled 2017-12-30: qty 1

## 2017-12-30 MED ORDER — MIDAZOLAM HCL 2 MG/2ML IJ SOLN
INTRAMUSCULAR | Status: AC
  Filled 2017-12-30: qty 2

## 2017-12-30 MED ORDER — FENTANYL CITRATE (PF) 250 MCG/5ML IJ SOLN
INTRAMUSCULAR | Status: AC
  Filled 2017-12-30: qty 5

## 2017-12-30 MED ORDER — ONDANSETRON HCL 4 MG/2ML IJ SOLN
INTRAMUSCULAR | Status: DC | PRN
  Administered 2017-12-30: 4 mg via INTRAVENOUS

## 2017-12-30 MED ORDER — SODIUM CHLORIDE (PF) 0.9 % IJ SOLN
INTRAVENOUS | Status: DC | PRN
  Administered 2017-12-30: 5 mL via INTRAMUSCULAR

## 2017-12-30 MED ORDER — LIDOCAINE HCL (PF) 1 % IJ SOLN
INTRAMUSCULAR | Status: AC
  Filled 2017-12-30: qty 5

## 2017-12-30 MED ORDER — DEXAMETHASONE SODIUM PHOSPHATE 10 MG/ML IJ SOLN
INTRAMUSCULAR | Status: DC | PRN
  Administered 2017-12-30: 10 mg via INTRAVENOUS

## 2017-12-30 MED ORDER — LIDOCAINE HCL 1 % IJ SOLN
INTRAMUSCULAR | Status: DC | PRN
  Administered 2017-12-30: 14 mL via INTRADERMAL

## 2017-12-30 MED ORDER — CEFAZOLIN SODIUM-DEXTROSE 2-4 GM/100ML-% IV SOLN
2.0000 g | INTRAVENOUS | Status: AC
  Administered 2017-12-30: 2 g via INTRAVENOUS
  Filled 2017-12-30: qty 100

## 2017-12-30 MED ORDER — 0.9 % SODIUM CHLORIDE (POUR BTL) OPTIME
TOPICAL | Status: DC | PRN
  Administered 2017-12-30: 1000 mL

## 2017-12-30 MED ORDER — SODIUM CHLORIDE 0.9 % IV SOLN
INTRAVENOUS | Status: DC | PRN
  Administered 2017-12-30: 20 ug/min via INTRAVENOUS

## 2017-12-30 MED ORDER — PROPOFOL 10 MG/ML IV BOLUS
INTRAVENOUS | Status: AC
  Filled 2017-12-30: qty 40

## 2017-12-30 MED ORDER — TRAMADOL HCL 50 MG PO TABS: 50.0000 mg | ORAL_TABLET | Freq: Four times a day (QID) | ORAL | 0 refills | Status: DC | PRN

## 2017-12-30 MED ORDER — PROMETHAZINE HCL 25 MG/ML IJ SOLN: 6.2500 mg | INTRAMUSCULAR | Status: DC | PRN

## 2017-12-30 MED ORDER — TECHNETIUM TC 99M SULFUR COLLOID FILTERED
1.0000 | Freq: Once | INTRAVENOUS | Status: AC | PRN
  Administered 2017-12-30: 1 via INTRADERMAL

## 2017-12-30 MED ORDER — PHENYLEPHRINE 40 MCG/ML (10ML) SYRINGE FOR IV PUSH (FOR BLOOD PRESSURE SUPPORT)
PREFILLED_SYRINGE | INTRAVENOUS | Status: DC | PRN
  Administered 2017-12-30 (×2): 40 ug via INTRAVENOUS

## 2017-12-30 MED ORDER — FENTANYL CITRATE (PF) 250 MCG/5ML IJ SOLN
INTRAMUSCULAR | Status: DC | PRN
  Administered 2017-12-30 (×6): 25 ug via INTRAVENOUS

## 2017-12-30 MED ORDER — PROPOFOL 10 MG/ML IV BOLUS
INTRAVENOUS | Status: DC | PRN
  Administered 2017-12-30: 120 mg via INTRAVENOUS

## 2017-12-30 MED ORDER — ACETAMINOPHEN 500 MG PO TABS
1000.0000 mg | ORAL_TABLET | ORAL | Status: AC
  Administered 2017-12-30: 1000 mg via ORAL
  Filled 2017-12-30: qty 2

## 2017-12-30 MED ORDER — LIDOCAINE HCL 1 % IJ SOLN
INTRAMUSCULAR | Status: AC
  Filled 2017-12-30: qty 20

## 2017-12-30 MED ORDER — LIDOCAINE 2% (20 MG/ML) 5 ML SYRINGE
INTRAMUSCULAR | Status: DC | PRN
  Administered 2017-12-30: 20 mg via INTRAVENOUS

## 2017-12-30 MED ORDER — BUPIVACAINE-EPINEPHRINE (PF) 0.25% -1:200000 IJ SOLN
INTRAMUSCULAR | Status: AC
  Filled 2017-12-30: qty 30

## 2017-12-30 SURGICAL SUPPLY — 59 items
BINDER BREAST LRG (GAUZE/BANDAGES/DRESSINGS) ×3 IMPLANT
BINDER BREAST XLRG (GAUZE/BANDAGES/DRESSINGS) IMPLANT
BLADE SURG 10 STRL SS (BLADE) ×3 IMPLANT
BNDG COHESIVE 4X5 TAN STRL (GAUZE/BANDAGES/DRESSINGS) ×3 IMPLANT
CANISTER SUCT 3000ML PPV (MISCELLANEOUS) ×3 IMPLANT
CHLORAPREP W/TINT 26ML (MISCELLANEOUS) ×3 IMPLANT
CLIP VESOCCLUDE LG 6/CT (CLIP) ×3 IMPLANT
CLIP VESOCCLUDE MED 24/CT (CLIP) ×3 IMPLANT
CLIP VESOCCLUDE SM WIDE 24/CT (CLIP) ×3 IMPLANT
CLOSURE WOUND 1/2 X4 (GAUZE/BANDAGES/DRESSINGS) ×1
CONT SPEC 4OZ CLIKSEAL STRL BL (MISCELLANEOUS) ×3 IMPLANT
COVER MAYO STAND STRL (DRAPES) ×3 IMPLANT
COVER PROBE W GEL 5X96 (DRAPES) ×3 IMPLANT
COVER SURGICAL LIGHT HANDLE (MISCELLANEOUS) ×3 IMPLANT
COVER WAND RF STERILE (DRAPES) ×3 IMPLANT
DECANTER SPIKE VIAL GLASS SM (MISCELLANEOUS) ×6 IMPLANT
DERMABOND ADVANCED (GAUZE/BANDAGES/DRESSINGS) ×2
DERMABOND ADVANCED .7 DNX12 (GAUZE/BANDAGES/DRESSINGS) ×1 IMPLANT
DEVICE DUBIN SPECIMEN MAMMOGRA (MISCELLANEOUS) ×3 IMPLANT
DRAPE UTILITY XL STRL (DRAPES) ×6 IMPLANT
DRSG PAD ABDOMINAL 8X10 ST (GAUZE/BANDAGES/DRESSINGS) ×3 IMPLANT
ELECT CAUTERY BLADE 6.4 (BLADE) ×3 IMPLANT
ELECT REM PT RETURN 9FT ADLT (ELECTROSURGICAL) ×3
ELECTRODE REM PT RTRN 9FT ADLT (ELECTROSURGICAL) ×1 IMPLANT
GAUZE SPONGE 4X4 12PLY STRL (GAUZE/BANDAGES/DRESSINGS) ×3 IMPLANT
GLOVE BIO SURGEON STRL SZ 6 (GLOVE) ×3 IMPLANT
GLOVE INDICATOR 6.5 STRL GRN (GLOVE) ×3 IMPLANT
GOWN STRL REUS W/ TWL LRG LVL3 (GOWN DISPOSABLE) ×1 IMPLANT
GOWN STRL REUS W/TWL 2XL LVL3 (GOWN DISPOSABLE) ×3 IMPLANT
GOWN STRL REUS W/TWL LRG LVL3 (GOWN DISPOSABLE) ×2
KIT BASIN OR (CUSTOM PROCEDURE TRAY) ×3 IMPLANT
KIT MARKER MARGIN INK (KITS) ×3 IMPLANT
KIT TURNOVER KIT B (KITS) ×3 IMPLANT
LIGHT WAVEGUIDE WIDE FLAT (MISCELLANEOUS) IMPLANT
NEEDLE 18GX1X1/2 (RX/OR ONLY) (NEEDLE) ×3 IMPLANT
NEEDLE FILTER BLUNT 18X 1/2SAF (NEEDLE)
NEEDLE FILTER BLUNT 18X1 1/2 (NEEDLE) IMPLANT
NEEDLE HYPO 25GX1X1/2 BEV (NEEDLE) ×6 IMPLANT
NS IRRIG 1000ML POUR BTL (IV SOLUTION) ×3 IMPLANT
PACK SURGICAL SETUP 50X90 (CUSTOM PROCEDURE TRAY) ×3 IMPLANT
PACK UNIVERSAL I (CUSTOM PROCEDURE TRAY) ×3 IMPLANT
PAD ABD 8X10 STRL (GAUZE/BANDAGES/DRESSINGS) ×3 IMPLANT
PAD ARMBOARD 7.5X6 YLW CONV (MISCELLANEOUS) ×3 IMPLANT
PENCIL BUTTON HOLSTER BLD 10FT (ELECTRODE) ×3 IMPLANT
SPONGE LAP 18X18 X RAY DECT (DISPOSABLE) ×3 IMPLANT
STOCKINETTE IMPERVIOUS 9X36 MD (GAUZE/BANDAGES/DRESSINGS) ×3 IMPLANT
STRIP CLOSURE SKIN 1/2X4 (GAUZE/BANDAGES/DRESSINGS) ×2 IMPLANT
SUT MNCRL AB 4-0 PS2 18 (SUTURE) ×3 IMPLANT
SUT SILK 2 0 SH (SUTURE) IMPLANT
SUT VIC AB 2-0 SH 27 (SUTURE) ×2
SUT VIC AB 2-0 SH 27XBRD (SUTURE) ×1 IMPLANT
SUT VIC AB 3-0 SH 27 (SUTURE) ×2
SUT VIC AB 3-0 SH 27X BRD (SUTURE) ×1 IMPLANT
SYR CONTROL 10ML LL (SYRINGE) ×6 IMPLANT
TOWEL OR 17X24 6PK STRL BLUE (TOWEL DISPOSABLE) ×3 IMPLANT
TOWEL OR 17X26 10 PK STRL BLUE (TOWEL DISPOSABLE) ×3 IMPLANT
TUBE CONNECTING 12'X1/4 (SUCTIONS) ×1
TUBE CONNECTING 12X1/4 (SUCTIONS) ×2 IMPLANT
YANKAUER SUCT BULB TIP NO VENT (SUCTIONS) ×3 IMPLANT

## 2017-12-30 NOTE — Discharge Instructions (Addendum)
[facility] °Office Phone Number [phone] ° °BREAST BIOPSY/ PARTIAL MASTECTOMY: POST OP INSTRUCTIONS ° °Always review your discharge instruction sheet given to you by the facility where your surgery was performed. ° °IF YOU HAVE DISABILITY OR FAMILY LEAVE FORMS, YOU MUST BRING THEM TO THE OFFICE FOR PROCESSING.  DO NOT GIVE THEM TO YOUR DOCTOR. ° °1. A prescription for pain medication may be given to you upon discharge.  Take your pain medication as prescribed, if needed.  If narcotic pain medicine is not needed, then you may take acetaminophen (Tylenol) or ibuprofen (Advil) as needed. °2. Take your usually prescribed medications unless otherwise directed °3. If you need a refill on your pain medication, please contact your pharmacy.  They will contact our office to request authorization.  Prescriptions will not be filled after 5pm or on week-ends. °4. You should eat very light the first 24 hours after surgery, such as soup, crackers, pudding, etc.  Resume your normal diet the day after surgery. °5. Most patients will experience some swelling and bruising in the breast.  Ice packs and a good support bra will help.  Swelling and bruising can take several days to resolve.  °6. It is common to experience some constipation if taking pain medication after surgery.  Increasing fluid intake and taking a stool softener will usually help or prevent this problem from occurring.  A mild laxative (Milk of Magnesia or Miralax) should be taken according to package directions if there are no bowel movements after 48 hours. °7. Unless discharge instructions indicate otherwise, you may remove your bandages 48 hours after surgery, and you may shower at that time.  You may have steri-strips (small skin tapes) in place directly over the incision.  These strips should be left on the skin for 7-10 days.   Any sutures or staples will be removed at the office during your follow-up visit. °8. ACTIVITIES:  You may resume  regular daily activities (gradually increasing) beginning the next day.  Wearing a good support bra or sports bra (or the breast binder) minimizes pain and swelling.  You may have sexual intercourse when it is comfortable. °a. You may drive when you no longer are taking prescription pain medication, you can comfortably wear a seatbelt, and you can safely maneuver your car and apply brakes. °b. RETURN TO WORK:  __________1 week_______________ °9. You should see your doctor in the office for a follow-up appointment approximately two weeks after your surgery.  Your doctor’s nurse will typically make your follow-up appointment when she calls you with your pathology report.  Expect your pathology report 2-3 business days after your surgery.  You may call to check if you do not hear from us after three days. ° ° °WHEN TO CALL YOUR DOCTOR: °1. Fever over 101.0 °2. Nausea and/or vomiting. °3. Extreme swelling or bruising. °4. Continued bleeding from incision. °5. Increased pain, redness, or drainage from the incision. ° °The clinic staff is available to answer your questions during regular business hours.  Please don’t hesitate to call and ask to speak to one of the nurses for clinical concerns.  If you have a medical emergency, go to the nearest emergency room or call 911.  A surgeon from [facility] is always on call at the hospital. ° °For further questions, please visit [email or web address]  ° °

## 2017-12-30 NOTE — Op Note (Addendum)
Pre op dx:  Invasive mammary carcinoma, lower outer quadrant, cT1cN0, grade 2-3, ductal phenotype, +/+/-  Post op dx - same  Procedure:  Right breast lumpectomy with sentinel lymph node mapping and biopsy  Surgeon:  Stark Klein, MD  Assistant:  Dewaine Oats, RNFA.   The patient was seen in the Holding Room. The risks, benefits, complications, treatment options, and expected outcomes were discussed with the patient. The possibilities of reaction to medication, pulmonary aspiration, bleeding, infection, the need for additional procedures, failure to diagnose a condition, and creating a complication requiring transfusion or operation were discussed with the patient. The patient concurred with the proposed plan, giving informed consent.  The site of surgery properly noted/marked. The patient was taken to Operating Room # 8, identified as Jocelyn Mccann and the procedure verified as Right Breast Lumpectomy and Sentinel Node Biopsy. A Time Out was held and the above information confirmed.  After induction of anesthesia, the right arm, breast, and chest were prepped and draped in standard fashion.  The lumpectomy was performed by creating a lateral elliptical incision incorporating the tethered skin and the mass.    Dissection was carried down to the pectoralis fascia.  The lumpectomy was marked with the margin marker paint kit.  Hemostasis was achieved with cautery.  The wound was irrigated and closed with a 3-0 Vicryl deep dermal interrupted and a 4-0 Monocryl subcuticular closure in layers.  Using a hand-held gamma probe, axillary sentinel nodes were identified transcutaneously.  An oblique incision was created below the axillary hairline.  Dissection was carried through the clavipectoral fascia. She had very little axillary fat pad or nodes left following prior surgery.  Two deep level 2 axillary sentinel nodes were removed and submitted to pathology revealing. The axillary incision was closed with 3-0  vicryl deep dermal interrupted sutures and 4-0 monocryl subcuticular closure in layers.    Sterile dressings were applied. At the end of the operation, all sponge, instrument, and needle counts were correct.  Findings: grossly clear surgical margins and highest cps 12, background 0.  anterior/lateral/inferior margins are skin.  posterior margin is pectoralis  Estimated Blood Loss:  Minimal     Specimens: right breast lumpectomy and 1 SLN                Complications:  None; patient tolerated the procedure well.         Disposition: PACU - hemodynamically stable.         Condition: stable

## 2017-12-30 NOTE — Anesthesia Procedure Notes (Signed)
Anesthesia Regional Block: Pectoralis block   Pre-Anesthetic Checklist: ,, timeout performed, Correct Patient, Correct Site, Correct Laterality, Correct Procedure, Correct Position, site marked, Risks and benefits discussed,  Surgical consent,  Pre-op evaluation,  At surgeon's request and post-op pain management  Laterality: Right  Prep: chloraprep       Needles:  Injection technique: Single-shot  Needle Type: Echogenic Needle     Needle Length: 9cm  Needle Gauge: 21     Additional Needles:   Procedures:,,,, ultrasound used (permanent image in chart),,,,  Narrative:  Start time: 12/30/2017 8:22 AM End time: 12/30/2017 8:29 AM Injection made incrementally with aspirations every 5 mL.  Performed by: Personally  Anesthesiologist: Suzette Battiest, MD

## 2017-12-30 NOTE — Anesthesia Postprocedure Evaluation (Signed)
Anesthesia Post Note  Patient: Jocelyn Mccann  Procedure(s) Performed: RIGHT BREAST LUMPECTOMY WITH SENTINEL LYMPH NODE BIOPSY ERAS PATHWAY (Right Breast)     Patient location during evaluation: PACU Anesthesia Type: General Level of consciousness: awake and alert Pain management: pain level controlled Vital Signs Assessment: post-procedure vital signs reviewed and stable Respiratory status: spontaneous breathing, nonlabored ventilation, respiratory function stable and patient connected to nasal cannula oxygen Cardiovascular status: blood pressure returned to baseline and stable Postop Assessment: no apparent nausea or vomiting Anesthetic complications: no    Last Vitals:  Vitals:   12/30/17 1125 12/30/17 1138  BP: 140/85 (!) 160/90  Pulse: 90 80  Resp: 17 17  Temp: (!) 36.4 C   SpO2: 100% 100%    Last Pain:  Vitals:   12/30/17 1120  TempSrc:   PainSc: 2                  Tiajuana Amass

## 2017-12-30 NOTE — Anesthesia Procedure Notes (Signed)
Procedure Name: LMA Insertion Date/Time: 12/30/2017 9:02 AM Performed by: Marsa Aris, CRNA Pre-anesthesia Checklist: Patient identified, Emergency Drugs available, Suction available and Patient being monitored Patient Re-evaluated:Patient Re-evaluated prior to induction Oxygen Delivery Method: Circle System Utilized Preoxygenation: Pre-oxygenation with 100% oxygen Induction Type: IV induction Ventilation: Mask ventilation without difficulty LMA: LMA inserted LMA Size: 4.0 Number of attempts: 1 Airway Equipment and Method: Bite block Placement Confirmation: positive ETCO2 Tube secured with: Tape Dental Injury: Teeth and Oropharynx as per pre-operative assessment

## 2017-12-30 NOTE — Transfer of Care (Signed)
Immediate Anesthesia Transfer of Care Note  Patient: Jocelyn Mccann  Procedure(s) Performed: RIGHT BREAST LUMPECTOMY WITH SENTINEL LYMPH NODE BIOPSY ERAS PATHWAY (Right Breast)  Patient Location: PACU  Anesthesia Type:GA combined with regional for post-op pain  Level of Consciousness: awake, alert  and oriented  Airway & Oxygen Therapy: Patient Spontanous Breathing and Patient connected to face mask oxygen  Post-op Assessment: Report given to RN and Post -op Vital signs reviewed and stable  Post vital signs: Reviewed and stable  Last Vitals:  Vitals Value Taken Time  BP    Temp    Pulse    Resp    SpO2      Last Pain:  Vitals:   12/30/17 0730  TempSrc:   PainSc: 7       Patients Stated Pain Goal: 2 (94/17/40 8144)  Complications: No apparent anesthesia complications

## 2017-12-30 NOTE — Interval H&P Note (Signed)
History and Physical Interval Note:  12/30/2017 8:37 AM  Jocelyn Mccann  has presented today for surgery, with the diagnosis of right breast cancer  The various methods of treatment have been discussed with the patient and family. After consideration of risks, benefits and other options for treatment, the patient has consented to  Procedure(s): RIGHT BREAST LUMPECTOMY WITH SENTINEL LYMPH NODE BIOPSY ERAS PATHWAY (Right) as a surgical intervention .  The patient's history has been reviewed, patient examined, no change in status, stable for surgery.  I have reviewed the patient's chart and labs.  Questions were answered to the patient's satisfaction.     Stark Klein

## 2017-12-31 ENCOUNTER — Encounter (HOSPITAL_COMMUNITY): Payer: Self-pay | Admitting: General Surgery

## 2018-01-19 NOTE — Progress Notes (Signed)
No show

## 2018-01-20 ENCOUNTER — Inpatient Hospital Stay: Payer: Medicare HMO | Attending: Oncology | Admitting: Oncology

## 2018-01-20 DIAGNOSIS — C50411 Malignant neoplasm of upper-outer quadrant of right female breast: Secondary | ICD-10-CM | POA: Insufficient documentation

## 2018-01-20 DIAGNOSIS — Z803 Family history of malignant neoplasm of breast: Secondary | ICD-10-CM | POA: Insufficient documentation

## 2018-01-20 DIAGNOSIS — F0390 Unspecified dementia without behavioral disturbance: Secondary | ICD-10-CM | POA: Insufficient documentation

## 2018-01-20 DIAGNOSIS — M810 Age-related osteoporosis without current pathological fracture: Secondary | ICD-10-CM | POA: Insufficient documentation

## 2018-01-20 DIAGNOSIS — Z170 Estrogen receptor positive status [ER+]: Secondary | ICD-10-CM | POA: Insufficient documentation

## 2018-01-31 NOTE — Progress Notes (Signed)
[place]  Telephone:([phone]) [phone] Fax:([phone]) [phone]     ID: Jocelyn Mccann DOB: Mar 12, 1928  MR#: [id number]  KUV#:[id number]  Patient Care Team: Rennis Golden as PCP - General (Physician Assistant) Chanson Teems, Virgie Dad, MD as Consulting Physician (Oncology) Darleene Cumpian, Virgie Dad, MD as Consulting Physician (Oncology) Susa Day, MD as Consulting Physician (Orthopedic Surgery) Stark Klein, MD as Consulting Physician (General Surgery) Gery Pray, MD as Consulting Physician (Radiation Oncology) OTHER MD:   CHIEF COMPLAINT: Estrogen receptor positive breast cancer   CURRENT TREATMENT: Anastrozole, [Prolia]   HISTORY OF CURRENT ILLNESS: From the original intake note:  Jocelyn Mccann has a history of right-sided breast cancer status in 2001 post prior surgery.  We do not have any pathology from that procedure and the family has very little information but there seems to have been no adjuvant radiation, no chemotherapy, and no adjuvant antiestrogens  More recently (?  June 2019) she had low dose bilateral low-dose screening mammography at [facility] in [place], [place], showing an area of increased nodularity at the site of prior surgery.  This area measured 1.3 cm and was in the upper outer central right breast.  This was read as suspicious for recurrent disease.    She underwent unilateral right diagnostic mammography with tomography and [facility] showing: Breast Density Category B.  On mammography there was a spiculated mass at the prior right lumpectomy site, which appeared unchanged.  On physical exam, no suspicious lumps are identified. On ultrasound, a right breast mass is found at 9:30, 1 cm from the nipple measuring 1.4 x 1.3 x 1.9 cm vs the 1.0 x 1.0 x 1.4 cm previously seen. There is no axillary adenopathy by ultrasonography.   Accordingly on 11/17/2017 she proceeded to biopsy of the  right breast area in question. The pathology from this procedure showed ([id number]): Invasive mammary carcinoma, E-cadherin positive (and therefore ductal), grade 2 or 3, estrogen receptor 100 percent positive, with strong staining intensity, progesterone receptor negative, proliferation marker Ki67 at 8%. HER2 negative by immunohistochemistry (1+).   The patient's subsequent history is as detailed below.   INTERVAL HISTORY: Jocelyn Mccann returns today for follow-up and treatment of her estrogen receptor positive breast cancer.  She is accompanied by her daughter.  Since her last visit here, she underwent a right breast lumpectomy on 12/30/2017. The pathology from this procedure showed ([id number]): 1. Breast, lumpectomy, right - invasive ductal carcinoma, grade III/III, spanning 2.2 cm. - ductal carcinoma in situ, high grade. - the surgical resection margins are negative for carcinoma. 2. Lymph node, sentinel, biopsy, right axillary   - there is no evidence of carcinoma in 1 of 1 lymph node (0/1)    REVIEW OF SYSTEMS: Blu had some mild bleeding at the incision site, but they just drained her incision. [name] is fairly inactive at home, so her daughter is looking to put her into an adult daycare during the day. [name]'s daughter states that [name] has bad knees and is unable to walk extended distances. The patient denies unusual headaches, visual changes, nausea, vomiting, or dizziness. There has been no unusual cough, phlegm production, or pleurisy. This been no change in bowel or bladder habits. The patient denies unexplained fatigue or unexplained weight loss, rash, or fever. A detailed review of systems was otherwise noncontributory.    PAST MEDICAL HISTORY: Past Medical History:  Diagnosis Date  . Bilateral knee pain   . Breast cancer ([place])  diagnosed 11/2017  . Dementia ([place])   . Osteoporosis   . Urge incontinence of urine      PAST SURGICAL HISTORY: Past Surgical History:    Procedure Laterality Date  . BREAST LUMPECTOMY WITH SENTINEL LYMPH NODE BIOPSY Right 2001  . BREAST LUMPECTOMY WITH SENTINEL LYMPH NODE BIOPSY Right 12/30/2017   Procedure: RIGHT BREAST LUMPECTOMY WITH SENTINEL LYMPH NODE BIOPSY ERAS PATHWAY;  Surgeon: Stark Klein, MD;  Location: [facility];  Service: General;  Laterality: Right;  . DENTAL SURGERY       FAMILY HISTORY Family History  Problem Relation Age of Onset  . Cancer Mother        breast    She notes that her father died from choking in his 44's. Patients' mother died from breast cancer at age 16. The patient has 4 brothers and 3 sisters. Patient denies any other family members with breast cancer or anyone in her family having ovarian cancer.    GYNECOLOGIC HISTORY:  Menarche: 85 years old Age at first live birth: 85 years old GX P: 3 LMP: No LMP recorded. Patient is postmenopausal. 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  She is originally from [place] and came to the [place] in [place].  She is widowed. She has three children. She lives with her daughter, [name], who is a stay at home mother. [name] is married to [place] who is an Education officer, museum at [employer]. [name] and Jimo have two children, Mariama and [name]. [name] is 85 years old and is going to [employer] for [employer]. [name] is 85 years old and will be graduating from high school next year. [name] attends a [employer].    ADVANCED DIRECTIVES: Her daughter, [name], is Terriona's medical power of attorney.    HEALTH MAINTENANCE: Social History   Tobacco Use  . Smoking status: Never Smoker  . Smokeless tobacco: Never Used  Substance Use Topics  . Alcohol use: Never    Frequency: Never  . Drug use: Never     Colonoscopy: Patient does not recall  PAP:   Bone density: History of osteoporosis   No Known Allergies  Current  Outpatient Medications  Medication Sig Dispense Refill  . busPIRone (BUSPAR) 7.5 MG tablet TAKE 1 TABLET BY MOUTH EVERY DAY (Patient not taking: No sig reported) 30 tablet 1  . citalopram (CELEXA) 20 MG tablet TAKE 1 TABLET EVERY NIGHT AT BEDTIME (Patient not taking: No sig reported) 30 tablet 1  . donepezil (ARICEPT) 5 MG tablet Take 5 mg by mouth every 30 (thirty) days.    . meloxicam (MOBIC) 7.5 MG tablet TAKE 1 TABLET BY MOUTH EVERY DAY (Patient not taking: No sig reported) 30 tablet 1  . mirabegron ER (MYRBETRIQ) 50 MG TB24 tablet Take 1 tablet (50 mg total) by mouth daily. (Patient not taking: Reported on 12/20/2017) 30 tablet 5  . traMADol (ULTRAM) 50 MG tablet Take 1 tablet (50 mg total) by mouth every 6 (six) hours as needed for moderate pain. 30 tablet 0   No current facility-administered medications for this visit.      OBJECTIVE: Elderly [place] woman using a walker  Vitals:   02/01/18 1445  BP: (!) 159/94  Pulse: 85  Resp: 18  Temp: 97.9 F (36.6 C)  SpO2: 100%     Body mass index is 24.01 kg/m.   Wt Readings from Last 3 Encounters:  02/01/18  144 lb 4.8 oz (65.5 kg)  12/30/17 150 lb (68 kg)  12/20/17 150 lb 4.8 oz (68.2 kg)      ECOG FS:2 - Symptomatic, <50% confined to bed  Sclerae unicteric, EOMs intact Oropharynx clear and moist No cervical or supraclavicular adenopathy Lungs no rales or rhonchi Heart regular rate and rhythm Abd soft, nontender, positive bowel sounds MSK no focal spinal tenderness, no upper extremity lymphedema Neuro: nonfocal, well oriented, appropriate affect Breasts: Right breast is status post recent lumpectomy and axillary lymph node sampling.  In the breast itself upper middle area there is a palpable mass which is unchanged from prior.  The incision itself, which is in a separate area, is healing fine, without dehiscence, erythema, or swelling.  In the right axilla there is some swelling associated with the incision but no erythema  or dehiscence.  The left breast and left axilla are benign.  LAB RESULTS:  CMP     Component Value Date/Time   NA 141 02/01/2018 1423   K 3.9 02/01/2018 1423   CL 104 02/01/2018 1423   CO2 27 02/01/2018 1423   GLUCOSE 100 (H) 02/01/2018 1423   BUN 14 02/01/2018 1423   CREATININE 0.81 02/01/2018 1423   CREATININE 0.85 03/31/2017 1144   CALCIUM 9.9 02/01/2018 1423   PROT 8.0 02/01/2018 1423   ALBUMIN 3.6 02/01/2018 1423   AST 21 02/01/2018 1423   ALT 13 02/01/2018 1423   ALKPHOS 114 02/01/2018 1423   BILITOT 0.5 02/01/2018 1423   GFRNONAA >60 02/01/2018 1423   GFRNONAA 62 03/31/2017 1144   GFRAA >60 02/01/2018 1423   GFRAA 71 03/31/2017 1144    No results found for: TOTALPROTELP, ALBUMINELP, A1GS, A2GS, BETS, BETA2SER, GAMS, MSPIKE, SPEI  No results found for: KPAFRELGTCHN, LAMBDASER, KAPLAMBRATIO  Lab Results  Component Value Date   WBC 3.4 (L) 02/01/2018   NEUTROABS 1.9 02/01/2018   HGB 13.5 02/01/2018   HCT 39.7 02/01/2018   MCV 98.3 02/01/2018   PLT 222 02/01/2018    '@LASTCHEMISTRY'$ @  No results found for: LABCA2  No components found for: DJMEQA834  No results for input(s): INR in the last 168 hours.  No results found for: LABCA2  No results found for: HDQ222  No results found for: LNL892  No results found for: JJH417  No results found for: CA2729  No components found for: HGQUANT  No results found for: CEA1 / No results found for: CEA1   No results found for: AFPTUMOR  No results found for: CHROMOGRNA  No results found for: PSA1  Appointment on 02/01/2018  Component Date Value Ref Range Status  . Sodium 02/01/2018 141  135 - 145 mmol/L Final  . Potassium 02/01/2018 3.9  3.5 - 5.1 mmol/L Final  . Chloride 02/01/2018 104  98 - 111 mmol/L Final  . CO2 02/01/2018 27  22 - 32 mmol/L Final  . Glucose, Bld 02/01/2018 100* 70 - 99 mg/dL Final  . BUN 02/01/2018 14  8 - 23 mg/dL Final  . Creatinine, Ser 02/01/2018 0.81  0.44 - 1.00 mg/dL Final  .  Calcium 02/01/2018 9.9  8.9 - 10.3 mg/dL Final  . Total Protein 02/01/2018 8.0  6.5 - 8.1 g/dL Final  . Albumin 02/01/2018 3.6  3.5 - 5.0 g/dL Final  . AST 02/01/2018 21  15 - 41 U/L Final  . ALT 02/01/2018 13  0 - 44 U/L Final  . Alkaline Phosphatase 02/01/2018 114  38 - 126 U/L Final  . Total Bilirubin  02/01/2018 0.5  0.3 - 1.2 mg/dL Final  . GFR calc non Af Amer 02/01/2018 >60  >60 mL/min Final  . GFR calc Af Amer 02/01/2018 >60  >60 mL/min Final  . Anion gap 02/01/2018 10  5 - 15 Final   Performed at [facility], [place] [address], [place], [place] [address]  . WBC 02/01/2018 3.4* 4.0 - 10.5 K/uL Final  . RBC 02/01/2018 4.04  3.87 - 5.11 MIL/uL Final  . Hemoglobin 02/01/2018 13.5  12.0 - 15.0 g/dL Final  . HCT 02/01/2018 39.7  36.0 - 46.0 % Final  . MCV 02/01/2018 98.3  80.0 - 100.0 fL Final  . MCH 02/01/2018 33.4  26.0 - 34.0 pg Final  . MCHC 02/01/2018 34.0  30.0 - 36.0 g/dL Final  . RDW 02/01/2018 13.2  11.5 - 15.5 % Final  . Platelets 02/01/2018 222  150 - 400 K/uL Final  . nRBC 02/01/2018 0.0  0.0 - 0.2 % Final  . Neutrophils Relative % 02/01/2018 55  % Final  . Neutro Abs 02/01/2018 1.9  1.7 - 7.7 K/uL Final  . Lymphocytes Relative 02/01/2018 30  % Final  . Lymphs Abs 02/01/2018 1.0  0.7 - 4.0 K/uL Final  . Monocytes Relative 02/01/2018 13  % Final  . Monocytes Absolute 02/01/2018 0.5  0.1 - 1.0 K/uL Final  . Eosinophils Relative 02/01/2018 1  % Final  . Eosinophils Absolute 02/01/2018 0.0  0.0 - 0.5 K/uL Final  . Basophils Relative 02/01/2018 1  % Final  . Basophils Absolute 02/01/2018 0.0  0.0 - 0.1 K/uL Final  . Immature Granulocytes 02/01/2018 0  % Final  . Abs Immature Granulocytes 02/01/2018 0.01  0.00 - 0.07 K/uL Final   Performed at [facility], [address] [place], [place] [id number]    (this displays the last labs from the last 3 days)  No results found for: TOTALPROTELP, ALBUMINELP, A1GS, A2GS, BETS,  BETA2SER, GAMS, MSPIKE, SPEI (this displays SPEP labs)  No results found for: KPAFRELGTCHN, LAMBDASER, KAPLAMBRATIO (kappa/lambda light chains)  No results found for: HGBA, HGBA2QUANT, HGBFQUANT, HGBSQUAN (Hemoglobinopathy evaluation)   No results found for: LDH  No results found for: IRON, TIBC, IRONPCTSAT (Iron and TIBC)  No results found for: FERRITIN  Urinalysis    Component Value Date/Time   COLORURINE YELLOW 12/20/2017 [place] 12/20/2017 1445   LABSPEC 1.026 12/20/2017 1445   PHURINE 5.0 12/20/2017 1445   [place] 12/20/2017 [place] 12/20/2017 [place] 12/20/2017 1445   KETONESUR NEGATIVE 12/20/2017 1445   PROTEINUR NEGATIVE 12/20/2017 1445   NITRITE NEGATIVE 12/20/2017 1445   LEUKOCYTESUR NEGATIVE 12/20/2017 1445     STUDIES:   No results found.   ELIGIBLE FOR AVAILABLE RESEARCH PROTOCOL:    ASSESSMENT: 85 y.o. [place], [place] woman s/p right upper outer quadrant biopsy 11/17/2017 for a clinical T1c N0, stage IA invasive ductal carcinoma, grade 2 or 3, estrogen receptor positive, progesterone receptor negative, HER-2 not amplified, with an MIB-1 of 8%  (1) prior history of right-sided breast cancer, status post lumpectomy  (2) status post right lumpectomy and sentinel node sampling 12/30/2017 for a pT2 pN0, stage IIB invasive ductal carcinoma, grade 3, with negative margins.  (a) a total of 1 sentinel lymph node was removed  (3) patient and family state they would prefer to avoid radiation if at all possible  (4) anastrozole started 02/01/2018  (a) DEXA scan 06/23/2016 was -  2.5, osteoporosis  (b) to start Prolia March 2020 if patient can tolerate anastrozole   PLAN: I reviewed the pathology results with the patient and her daughter so they understand we are dealing with stage IV disease which is estrogen dependent.  They understand that we normally proceed to adjuvant radiation at this point  given the patient's age and comorbidities we are bypassing that and going straight to antiestrogens.  However if the patient is unable to tolerate antiestrogens we would go back to adjuvant radiation.  We specifically discussed anastrozole and to have a good understanding of the possible toxicities, side effects and complications of this agent.  We are starting it now.  If Kedra can tolerate it, then when she sees me again in March she will have Prolia that day and we will start that every 6 months for the next 2 years after which we will repeat her bone density  However if she does not tolerate anastrozole we will as mentioned proceed to radiation  Erion does have mild dementia, which was apparent during today's visit: She is very pleasant, but needs much repetition with very simple questions.  I suggested that her daughter read "the 36-hour day".  Enrolling Tara in a senior citizens program also would be helpful  They know to call for any other issue that may develop before the next visit.Jana Hakim, Virgie Dad, MD  02/01/18 3:07 PM Medical Oncology and Hematology [facility] [address] [place],  [address] Tel. [phone]    Fax. [phone]   I, Jacqualyn Posey am acting as a Education administrator for Chauncey Cruel, MD.   I, Lurline Del MD, have reviewed the above documentation for accuracy and completeness, and I agree with the above.

## 2018-02-01 ENCOUNTER — Inpatient Hospital Stay: Payer: Medicare HMO

## 2018-02-01 ENCOUNTER — Telehealth: Payer: Self-pay | Admitting: Oncology

## 2018-02-01 ENCOUNTER — Inpatient Hospital Stay (HOSPITAL_BASED_OUTPATIENT_CLINIC_OR_DEPARTMENT_OTHER): Payer: Medicare HMO | Admitting: Oncology

## 2018-02-01 VITALS — BP 159/94 | HR 85 | Temp 97.9°F | Resp 18 | Ht 65.0 in | Wt 144.3 lb

## 2018-02-01 DIAGNOSIS — C50411 Malignant neoplasm of upper-outer quadrant of right female breast: Secondary | ICD-10-CM

## 2018-02-01 DIAGNOSIS — Z170 Estrogen receptor positive status [ER+]: Secondary | ICD-10-CM | POA: Diagnosis not present

## 2018-02-01 DIAGNOSIS — M810 Age-related osteoporosis without current pathological fracture: Secondary | ICD-10-CM | POA: Diagnosis not present

## 2018-02-01 DIAGNOSIS — F0390 Unspecified dementia without behavioral disturbance: Secondary | ICD-10-CM | POA: Diagnosis not present

## 2018-02-01 DIAGNOSIS — Z803 Family history of malignant neoplasm of breast: Secondary | ICD-10-CM | POA: Diagnosis not present

## 2018-02-01 LAB — CBC WITH DIFFERENTIAL/PLATELET
Abs Immature Granulocytes: 0.01 10*3/uL (ref 0.00–0.07)
BASOS PCT: 1 %
Basophils Absolute: 0 10*3/uL (ref 0.0–0.1)
EOS ABS: 0 10*3/uL (ref 0.0–0.5)
Eosinophils Relative: 1 %
HCT: 39.7 % (ref 36.0–46.0)
Hemoglobin: 13.5 g/dL (ref 12.0–15.0)
IMMATURE GRANULOCYTES: 0 %
Lymphocytes Relative: 30 %
Lymphs Abs: 1 10*3/uL (ref 0.7–4.0)
MCH: 33.4 pg (ref 26.0–34.0)
MCHC: 34 g/dL (ref 30.0–36.0)
MCV: 98.3 fL (ref 80.0–100.0)
Monocytes Absolute: 0.5 10*3/uL (ref 0.1–1.0)
Monocytes Relative: 13 %
NEUTROS PCT: 55 %
Neutro Abs: 1.9 10*3/uL (ref 1.7–7.7)
PLATELETS: 222 10*3/uL (ref 150–400)
RBC: 4.04 MIL/uL (ref 3.87–5.11)
RDW: 13.2 % (ref 11.5–15.5)
WBC: 3.4 10*3/uL — ABNORMAL LOW (ref 4.0–10.5)
nRBC: 0 % (ref 0.0–0.2)

## 2018-02-01 LAB — COMPREHENSIVE METABOLIC PANEL
ALBUMIN: 3.6 g/dL (ref 3.5–5.0)
ALT: 13 U/L (ref 0–44)
AST: 21 U/L (ref 15–41)
Alkaline Phosphatase: 114 U/L (ref 38–126)
Anion gap: 10 (ref 5–15)
BUN: 14 mg/dL (ref 8–23)
CALCIUM: 9.9 mg/dL (ref 8.9–10.3)
CO2: 27 mmol/L (ref 22–32)
Chloride: 104 mmol/L (ref 98–111)
Creatinine, Ser: 0.81 mg/dL (ref 0.44–1.00)
GFR calc Af Amer: >60 mL/min (ref >60)
GFR calc non Af Amer: >60 mL/min (ref >60)
Glucose, Bld: 100 mg/dL — ABNORMAL HIGH (ref 70–99)
Potassium: 3.9 mmol/L (ref 3.5–5.1)
Sodium: 141 mmol/L (ref 135–145)
Total Bilirubin: 0.5 mg/dL (ref 0.3–1.2)
Total Protein: 8 g/dL (ref 6.5–8.1)

## 2018-02-01 MED ORDER — ANASTROZOLE 1 MG PO TABS: 1.0000 mg | ORAL_TABLET | Freq: Every day | ORAL | 4 refills | Status: DC

## 2018-02-01 NOTE — Telephone Encounter (Signed)
Gave avs and calendar ° °

## 2018-02-02 ENCOUNTER — Other Ambulatory Visit: Payer: Self-pay | Admitting: Oncology

## 2018-02-02 ENCOUNTER — Encounter: Payer: Self-pay | Admitting: Oncology

## 2018-02-02 LAB — VITAMIN D 25 HYDROXY (VIT D DEFICIENCY, FRACTURES): Vit D, 25-Hydroxy: 11.5 ng/mL — ABNORMAL LOW (ref 30.0–100.0)

## 2018-02-02 MED ORDER — VITAMIN D 25 MCG (1000 UNIT) PO TABS: 1000.0000 [IU] | ORAL_TABLET | Freq: Every day | ORAL | 4 refills | Status: DC

## 2018-03-31 ENCOUNTER — Other Ambulatory Visit: Payer: Self-pay

## 2018-03-31 ENCOUNTER — Telehealth: Payer: Self-pay | Admitting: Oncology

## 2018-03-31 MED ORDER — ANASTROZOLE 1 MG PO TABS: 1.0000 mg | ORAL_TABLET | Freq: Every day | ORAL | 0 refills | Status: DC

## 2018-03-31 NOTE — Telephone Encounter (Signed)
Called daughter regarding 6/23

## 2018-04-03 ENCOUNTER — Ambulatory Visit: Payer: Medicare HMO | Admitting: Oncology

## 2018-04-03 ENCOUNTER — Other Ambulatory Visit: Payer: Medicare HMO

## 2018-04-03 ENCOUNTER — Ambulatory Visit: Payer: Medicare HMO

## 2018-07-03 ENCOUNTER — Telehealth: Payer: Self-pay | Admitting: Oncology

## 2018-07-03 NOTE — Telephone Encounter (Signed)
Contacted patient to verify telephone visit for pre reg °

## 2018-07-03 NOTE — Progress Notes (Signed)
[place]  [phone] [phone]     ID: ZELTA ENFIELD DOB: [birth year]  MR#: [id number]  MGN#:[id number]  Patient Care Team: Rennis Golden as PCP - General (Physician Assistant) Anishka Bushard, Virgie Dad, MD as Consulting Physician (Oncology) Karcyn Menn, Virgie Dad, MD as Consulting Physician (Oncology) Susa Day, MD as Consulting Physician (Orthopedic Surgery) Stark Klein, MD as Consulting Physician (General Surgery) Gery Pray, MD as Consulting Physician (Radiation Oncology) OTHER MD:   CHIEF COMPLAINT: Estrogen receptor positive breast cancer   CURRENT TREATMENT: Anastrozole, [Prolia]   I connected with Truett Mainland on 07/04/18 at 11:30 AM EDT by telephone visit and verified that I am speaking with the correct person using two identifiers.   I discussed the limitations, risks, security and privacy concerns of performing an evaluation and management service by telemedicine and the availability of in-person appointments. I also discussed with the patient that there may be a patient responsible charge related to this service. The patient expressed understanding and agreed to proceed.   Other persons participating in the visit and their role in the encounter:   - [place], patient's daughter  Patient's location: home  Provider's location: [place]   Chief Complaint: Estrogen receptor positive breast cancer     HISTORY OF CURRENT ILLNESS: From the original intake note:  Jocelyn Mccann has a history of right-sided breast cancer status in 2001 post prior surgery.  We do not have any pathology from that procedure and the family has very little information but there seems to have been no adjuvant radiation, no chemotherapy, and no adjuvant antiestrogens  More recently (?  June 2019) she had low dose bilateral low-dose screening mammography at radiology associates in [place],  [place], showing an area of increased nodularity at the site of prior surgery.  This area measured 1.3 cm and was in the upper outer central right breast.  This was read as suspicious for recurrent disease.    She underwent unilateral right diagnostic mammography with tomography and [facility] showing: Breast Density Category B.  On mammography there was a spiculated mass at the prior right lumpectomy site, which appeared unchanged.  On physical exam, no suspicious lumps are identified. On ultrasound, a right breast mass is found at 9:30, 1 cm from the nipple measuring 1.4 x 1.3 x 1.9 cm vs the 1.0 x 1.0 x 1.4 cm previously seen. There is no axillary adenopathy by ultrasonography.   Accordingly on 11/17/2017 she proceeded to biopsy of the right breast area in question. The pathology from this procedure showed ([id number]): Invasive mammary carcinoma, E-cadherin positive (and therefore ductal), grade 2 or 3, estrogen receptor 100 percent positive, with strong staining intensity, progesterone receptor negative, proliferation marker Ki67 at 8%. HER2 negative by immunohistochemistry (1+).   The patient's subsequent history is as detailed below.   INTERVAL HISTORY: Jocelyn Mccann is contacted today for follow-up and treatment of her estrogen receptor positive breast cancer. Due to her dementia, the majority of the history is given by the patient's daughter, [name].   She continues on anastrozole. She tolerates this well and without any noticeable side effects.  The daughter notes some difficult administering the pill to [facility], and she usually crushes it and puts it in her tea, but hasn't done that recently  Dauna's last bone density screening on 06/23/2016, showed a T-score of -2.5, which is considered osteoporotic. She is due  for a repeat bone density screening.   Since her last visit here, she has not undergone any additional studies.    REVIEW OF  SYSTEMS: Jocelyn Mccann is well protected against the spread of the virus, she has only been out of the house once since the start of the pandemic. A detailed review of systems was otherwise noncontributory.    PAST MEDICAL HISTORY: Past Medical History:  Diagnosis Date  . Bilateral knee pain   . Breast cancer ([place])    diagnosed 11/2017  . Dementia ([place])   . Osteoporosis   . Urge incontinence of urine      PAST SURGICAL HISTORY: Past Surgical History:  Procedure Laterality Date  . BREAST LUMPECTOMY WITH SENTINEL LYMPH NODE BIOPSY Right 2001  . BREAST LUMPECTOMY WITH SENTINEL LYMPH NODE BIOPSY Right 12/30/2017   Procedure: RIGHT BREAST LUMPECTOMY WITH SENTINEL LYMPH NODE BIOPSY ERAS PATHWAY;  Surgeon: Stark Klein, MD;  Location: [facility];  Service: General;  Laterality: Right;  . DENTAL SURGERY       FAMILY HISTORY Family History  Problem Relation Age of Onset  . Cancer Mother        breast    She notes that her father died from choking in his 39's. Patients' mother died from breast cancer at age 64. The patient has 4 brothers and 3 sisters. Patient denies any other family members with breast cancer or anyone in her family having ovarian cancer.    GYNECOLOGIC HISTORY:  Menarche: 85 years old Age at first live birth: 85 years old GX P: 3 LMP: No LMP recorded. Patient is postmenopausal. 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  She is originally from [place] and came [place] the [place] in [place].  She is widowed. She has three children. She lives with her daughter, [name], who is a stay at home mother. [name] is married to [place] who is an Education officer, museum at [employer]. [name] and Jimo have two children, Mariama and [name]. [name] is 85 years old and is going to [employer] for [employer]. [name] is 85 years old and will be graduating from high school next year. [name] attends a [employer].    ADVANCED DIRECTIVES: Her daughter, [name], is [place]'s medical power of attorney.    HEALTH MAINTENANCE: Social History   Tobacco Use  . Smoking status: Never Smoker  . Smokeless tobacco: Never Used  Substance Use Topics  . Alcohol use: Never    Frequency: Never  . Drug use: Never     Colonoscopy: Patient does not recall  PAP:   Bone density: History of osteoporosis   No Known Allergies  Current Outpatient Medications  Medication Sig Dispense Refill  . anastrozole (ARIMIDEX) 1 MG tablet Take 1 tablet (1 mg total) by mouth daily. 90 tablet 0  . busPIRone (BUSPAR) 7.5 MG tablet TAKE 1 TABLET BY MOUTH EVERY DAY (Patient not taking: No sig reported) 30 tablet 1  . cholecalciferol (VITAMIN D3) 25 MCG (1000 UT) tablet Take 1 tablet (1,000 Units total) by mouth daily. 90 tablet 4  . citalopram (CELEXA) 20 MG tablet TAKE 1 TABLET EVERY NIGHT AT BEDTIME (Patient not taking: No sig reported) 30 tablet 1  . donepezil (ARICEPT) 5 MG tablet Take 5 mg by mouth every 30 (thirty) days.    . meloxicam (MOBIC) 7.5 MG tablet TAKE 1 TABLET BY MOUTH EVERY DAY (Patient not taking: No  sig reported) 30 tablet 1  . mirabegron ER (MYRBETRIQ) 50 MG TB24 tablet Take 1 tablet (50 mg total) by mouth daily. (Patient not taking: Reported on 12/20/2017) 30 tablet 5  . traMADol (ULTRAM) 50 MG tablet Take 1 tablet (50 mg total) by mouth every 6 (six) hours as needed for moderate pain. 30 tablet 0   No current facility-administered medications for this visit.      OBJECTIVE: Elderly [place] woman in no acute distress  There were no vitals filed for this visit.   There is no height or weight on file to calculate BMI.   Wt Readings from Last 3 Encounters:  02/01/18 144 lb 4.8 oz (65.5 kg)  12/30/17 150 lb (68 kg)  12/20/17 150 lb 4.8 oz (68.2 kg)      ECOG FS:2 - Symptomatic, <50% confined to bed   LAB RESULTS:  CMP     Component Value Date/Time   NA 141  02/01/2018 1423   K 3.9 02/01/2018 1423   CL 104 02/01/2018 1423   CO2 27 02/01/2018 1423   GLUCOSE 100 (H) 02/01/2018 1423   BUN 14 02/01/2018 1423   CREATININE 0.81 02/01/2018 1423   CREATININE 0.85 03/31/2017 1144   CALCIUM 9.9 02/01/2018 1423   PROT 8.0 02/01/2018 1423   ALBUMIN 3.6 02/01/2018 1423   AST 21 02/01/2018 1423   ALT 13 02/01/2018 1423   ALKPHOS 114 02/01/2018 1423   BILITOT 0.5 02/01/2018 1423   GFRNONAA >60 02/01/2018 1423   GFRNONAA 62 03/31/2017 1144   GFRAA >60 02/01/2018 1423   GFRAA 71 03/31/2017 1144    No results found for: TOTALPROTELP, ALBUMINELP, A1GS, A2GS, BETS, BETA2SER, GAMS, MSPIKE, SPEI  No results found for: KPAFRELGTCHN, LAMBDASER, KAPLAMBRATIO  Lab Results  Component Value Date   WBC 3.4 (L) 02/01/2018   NEUTROABS 1.9 02/01/2018   HGB 13.5 02/01/2018   HCT 39.7 02/01/2018   MCV 98.3 02/01/2018   PLT 222 02/01/2018    _0 @  No results found for: LABCA2  No components found for: [id number]  No results for input(s): INR in the last 168 hours.  No results found for: LABCA2  No results found for: OEV035  No results found for: KKX381  No results found for: WEX937  No results found for: CA2729  No components found for: HGQUANT  No results found for: CEA1 / No results found for: CEA1   No results found for: AFPTUMOR  No results found for: CHROMOGRNA  No results found for: PSA1  No visits with results within 3 Day(s) from this visit.  Latest known visit with results is:  Appointment on 02/01/2018  Component Date Value Ref Range Status  . Vit D, 25-Hydroxy 02/01/2018 11.5* 30.0 - 100.0 ng/mL Final   Comment: (NOTE) Vitamin D deficiency has been defined by the [place] practice guideline as a level of serum 25-OH vitamin D less than 20 ng/mL (1,2). The Endocrine Society went on to further define vitamin D insufficiency as a level between 21 and 29 ng/mL (2). 1. IOM  (Institute of Medicine). 2010. Dietary reference   intakes for calcium and D. [place]: The   [employer]. 2. Holick MF, [place], Bischoff-Ferrari HA, et al.   Evaluation, treatment, and prevention of vitamin D   deficiency: an Endocrine Society clinical practice   guideline. JCEM. 2011 Jul; 96(7):1911-30. Performed At: [facility] [place], [place] [id number] Rush Farmer MD [id number]   .  Sodium 02/01/2018 141  135 - 145 mmol/L Final  . Potassium 02/01/2018 3.9  3.5 - 5.1 mmol/L Final  . Chloride 02/01/2018 104  98 - 111 mmol/L Final  . CO2 02/01/2018 27  22 - 32 mmol/L Final  . Glucose, Bld 02/01/2018 100* 70 - 99 mg/dL Final  . BUN 02/01/2018 14  8 - 23 mg/dL Final  . Creatinine, Ser 02/01/2018 0.81  0.44 - 1.00 mg/dL Final  . Calcium 02/01/2018 9.9  8.9 - 10.3 mg/dL Final  . Total Protein 02/01/2018 8.0  6.5 - 8.1 g/dL Final  . Albumin 02/01/2018 3.6  3.5 - 5.0 g/dL Final  . AST 02/01/2018 21  15 - 41 U/L Final  . ALT 02/01/2018 13  0 - 44 U/L Final  . Alkaline Phosphatase 02/01/2018 114  38 - 126 U/L Final  . Total Bilirubin 02/01/2018 0.5  0.3 - 1.2 mg/dL Final  . GFR calc non Af Amer 02/01/2018 >60  >60 mL/min Final  . GFR calc Af Amer 02/01/2018 >60  >60 mL/min Final  . Anion gap 02/01/2018 10  5 - 15 Final   Performed at [facility], [place] [address] [place], [place] [address]  . WBC 02/01/2018 3.4* 4.0 - 10.5 K/uL Final  . RBC 02/01/2018 4.04  3.87 - 5.11 MIL/uL Final  . Hemoglobin 02/01/2018 13.5  12.0 - 15.0 g/dL Final  . HCT 02/01/2018 39.7  36.0 - 46.0 % Final  . MCV 02/01/2018 98.3  80.0 - 100.0 fL Final  . MCH 02/01/2018 33.4  26.0 - 34.0 pg Final  . MCHC 02/01/2018 34.0  30.0 - 36.0 g/dL Final  . RDW 02/01/2018 13.2  11.5 - 15.5 % Final  . Platelets 02/01/2018 222  150 - 400 K/uL Final  . nRBC 02/01/2018 0.0  0.0 - 0.2 % Final  . Neutrophils Relative % 02/01/2018 55  % Final  . Neutro  Abs 02/01/2018 1.9  1.7 - 7.7 K/uL Final  . Lymphocytes Relative 02/01/2018 30  % Final  . Lymphs Abs 02/01/2018 1.0  0.7 - 4.0 K/uL Final  . Monocytes Relative 02/01/2018 13  % Final  . Monocytes Absolute 02/01/2018 0.5  0.1 - 1.0 K/uL Final  . Eosinophils Relative 02/01/2018 1  % Final  . Eosinophils Absolute 02/01/2018 0.0  0.0 - 0.5 K/uL Final  . Basophils Relative 02/01/2018 1  % Final  . Basophils Absolute 02/01/2018 0.0  0.0 - 0.1 K/uL Final  . Immature Granulocytes 02/01/2018 0  % Final  . Abs Immature Granulocytes 02/01/2018 0.01  0.00 - 0.07 K/uL Final   Performed at [facility], [address] [place], [place] [address]    (this displays the last labs from the last 3 days)  No results found for: TOTALPROTELP, ALBUMINELP, A1GS, A2GS, BETS, BETA2SER, GAMS, MSPIKE, SPEI (this displays SPEP labs)  No results found for: KPAFRELGTCHN, LAMBDASER, KAPLAMBRATIO (kappa/lambda light chains)  No results found for: HGBA, HGBA2QUANT, HGBFQUANT, HGBSQUAN (Hemoglobinopathy evaluation)   No results found for: LDH  No results found for: IRON, TIBC, IRONPCTSAT (Iron and TIBC)  No results found for: FERRITIN  Urinalysis    Component Value Date/Time   COLORURINE YELLOW 12/20/2017 [place] 12/20/2017 1445   LABSPEC 1.026 12/20/2017 1445   PHURINE 5.0 12/20/2017 1445   GLUCOSEU NEGATIVE 12/20/2017 [place] 12/20/2017 [place] 12/20/2017 [place] 12/20/2017 [place] 12/20/2017  [place] 12/20/2017 [place] 12/20/2017 1445     STUDIES:   No results found.   ELIGIBLE FOR AVAILABLE RESEARCH PROTOCOL:    ASSESSMENT: 85 y.o. [place] woman s/p right upper outer quadrant biopsy 11/17/2017 for a clinical T1c N0, stage IA invasive ductal carcinoma, grade 2 or 3, estrogen receptor positive, progesterone receptor negative, HER-2 not  amplified, with an MIB-1 of 8%  (1) prior history of right-sided breast cancer, status post lumpectomy  (2) status post right lumpectomy and sentinel node sampling 12/30/2017 for a pT2 pN0, stage IIB invasive ductal carcinoma, grade 3, with negative margins.  (a) a total of 1 sentinel lymph node was removed  (3) patient and family state they would prefer to avoid radiation if at all possible  (4) anastrozole started 02/01/2018  (a) DEXA scan 06/23/2016 was -2.5, osteoporosis  (b) to start Prolia March 2020 if patient can tolerate anastrozole   PLAN: Jilliana is a little over 6 months out from definitive surgery for her breast cancer.  She appears to be tolerating anastrozole well.  She is refusing to take the pill although it is not because of hot flashes it is related to her dementia problem.  Her daughter has been crushing the pill as described above.  I have asked pharmacy to investigate if there is a powder or liquid form that might make this easier.  It will be helpful if the family uses gloves or otherwise protects themselves against this hormonal agent but otherwise I would not anticipate a problem in terms of effectiveness since this is not a sustained release formulation  Normally we would have started Prolia by now but I really think it is not safe for Ms. Aveni to be out of the house at 47 unless it is absolutely necessary.  Tentatively I am scheduling her for mammography in November and to see me shortly thereafter.  We can do Prolia that day assuming it safe for her by that  Otherwise they know to call for any other issue that may develop before the next visit here.   , Virgie Dad, MD  07/04/18 11:31 AM Medical Oncology and Hematology [facility] [address] [place], [place] [address] Tel. [phone]    Fax. [phone]   I, Jacqualyn Posey am acting as a Education administrator for Chauncey Cruel, MD.   I, Lurline Del MD, have reviewed the above  documentation for accuracy and completeness, and I agree with the above.

## 2018-07-03 NOTE — Telephone Encounter (Signed)
Called patient regarding upcoming [facility] appointment, per patient's daughter's request this will be a telephone visit.

## 2018-07-04 ENCOUNTER — Ambulatory Visit: Payer: Medicare HMO

## 2018-07-04 ENCOUNTER — Inpatient Hospital Stay: Payer: Medicare HMO | Attending: Oncology | Admitting: Oncology

## 2018-07-04 ENCOUNTER — Other Ambulatory Visit: Payer: Medicare HMO

## 2018-07-04 DIAGNOSIS — Z79899 Other long term (current) drug therapy: Secondary | ICD-10-CM

## 2018-07-04 DIAGNOSIS — F0150 Vascular dementia without behavioral disturbance: Secondary | ICD-10-CM

## 2018-07-04 DIAGNOSIS — Z79811 Long term (current) use of aromatase inhibitors: Secondary | ICD-10-CM

## 2018-07-04 DIAGNOSIS — C50411 Malignant neoplasm of upper-outer quadrant of right female breast: Secondary | ICD-10-CM | POA: Diagnosis not present

## 2018-07-04 DIAGNOSIS — Z170 Estrogen receptor positive status [ER+]: Secondary | ICD-10-CM | POA: Diagnosis not present

## 2018-07-05 ENCOUNTER — Telehealth: Payer: Self-pay | Admitting: Oncology

## 2018-07-05 NOTE — Telephone Encounter (Signed)
I talk with patients care giver regarding schedule

## 2018-07-27 ENCOUNTER — Other Ambulatory Visit: Payer: Self-pay | Admitting: Oncology

## 2018-07-27 DIAGNOSIS — C50411 Malignant neoplasm of upper-outer quadrant of right female breast: Secondary | ICD-10-CM

## 2018-07-27 NOTE — Telephone Encounter (Signed)
Started anastrozole 02-01-2018.  Scheduled F/U 11-27-2018.

## 2018-08-21 ENCOUNTER — Other Ambulatory Visit: Payer: Self-pay

## 2018-08-21 ENCOUNTER — Inpatient Hospital Stay (HOSPITAL_COMMUNITY)
Admission: EM | Admit: 2018-08-21 | Discharge: 2018-08-26 | DRG: 087 | Disposition: A | Discharge status: Skilled Nursing Facility | Payer: Medicare HMO | Attending: Internal Medicine | Admitting: Internal Medicine

## 2018-08-21 ENCOUNTER — Encounter (HOSPITAL_COMMUNITY): Payer: Self-pay | Admitting: Internal Medicine

## 2018-08-21 ENCOUNTER — Emergency Department (HOSPITAL_COMMUNITY): Payer: Medicare HMO

## 2018-08-21 DIAGNOSIS — M2550 Pain in unspecified joint: Secondary | ICD-10-CM | POA: Diagnosis not present

## 2018-08-21 DIAGNOSIS — S065X9A Traumatic subdural hemorrhage with loss of consciousness of unspecified duration, initial encounter: Secondary | ICD-10-CM | POA: Diagnosis not present

## 2018-08-21 DIAGNOSIS — Z20828 Contact with and (suspected) exposure to other viral communicable diseases: Secondary | ICD-10-CM | POA: Diagnosis not present

## 2018-08-21 DIAGNOSIS — W19XXXA Unspecified fall, initial encounter: Secondary | ICD-10-CM | POA: Diagnosis present

## 2018-08-21 DIAGNOSIS — S0011XA Contusion of right eyelid and periocular area, initial encounter: Secondary | ICD-10-CM | POA: Diagnosis present

## 2018-08-21 DIAGNOSIS — Z79811 Long term (current) use of aromatase inhibitors: Secondary | ICD-10-CM | POA: Diagnosis not present

## 2018-08-21 DIAGNOSIS — R29701 NIHSS score 1: Secondary | ICD-10-CM | POA: Diagnosis present

## 2018-08-21 DIAGNOSIS — T1490XA Injury, unspecified, initial encounter: Secondary | ICD-10-CM

## 2018-08-21 DIAGNOSIS — E876 Hypokalemia: Secondary | ICD-10-CM | POA: Diagnosis not present

## 2018-08-21 DIAGNOSIS — M25562 Pain in left knee: Secondary | ICD-10-CM | POA: Diagnosis present

## 2018-08-21 DIAGNOSIS — Y92009 Unspecified place in unspecified non-institutional (private) residence as the place of occurrence of the external cause: Secondary | ICD-10-CM | POA: Diagnosis not present

## 2018-08-21 DIAGNOSIS — R739 Hyperglycemia, unspecified: Secondary | ICD-10-CM | POA: Diagnosis not present

## 2018-08-21 DIAGNOSIS — S065XAA Traumatic subdural hemorrhage with loss of consciousness status unknown, initial encounter: Secondary | ICD-10-CM | POA: Diagnosis present

## 2018-08-21 DIAGNOSIS — I10 Essential (primary) hypertension: Secondary | ICD-10-CM | POA: Diagnosis not present

## 2018-08-21 DIAGNOSIS — M810 Age-related osteoporosis without current pathological fracture: Secondary | ICD-10-CM | POA: Diagnosis present

## 2018-08-21 DIAGNOSIS — Z7401 Bed confinement status: Secondary | ICD-10-CM | POA: Diagnosis not present

## 2018-08-21 DIAGNOSIS — Z853 Personal history of malignant neoplasm of breast: Secondary | ICD-10-CM

## 2018-08-21 DIAGNOSIS — M170 Bilateral primary osteoarthritis of knee: Secondary | ICD-10-CM | POA: Diagnosis not present

## 2018-08-21 DIAGNOSIS — S065X9D Traumatic subdural hemorrhage with loss of consciousness of unspecified duration, subsequent encounter: Secondary | ICD-10-CM | POA: Diagnosis not present

## 2018-08-21 DIAGNOSIS — S065X0A Traumatic subdural hemorrhage without loss of consciousness, initial encounter: Secondary | ICD-10-CM | POA: Diagnosis not present

## 2018-08-21 DIAGNOSIS — M25561 Pain in right knee: Secondary | ICD-10-CM | POA: Diagnosis not present

## 2018-08-21 DIAGNOSIS — R220 Localized swelling, mass and lump, head: Secondary | ICD-10-CM | POA: Diagnosis not present

## 2018-08-21 DIAGNOSIS — S06360A Traumatic hemorrhage of cerebrum, unspecified, without loss of consciousness, initial encounter: Secondary | ICD-10-CM | POA: Diagnosis not present

## 2018-08-21 DIAGNOSIS — Z03818 Encounter for observation for suspected exposure to other biological agents ruled out: Secondary | ICD-10-CM | POA: Diagnosis not present

## 2018-08-21 DIAGNOSIS — M25862 Other specified joint disorders, left knee: Secondary | ICD-10-CM | POA: Diagnosis not present

## 2018-08-21 DIAGNOSIS — F0390 Unspecified dementia without behavioral disturbance: Secondary | ICD-10-CM | POA: Diagnosis present

## 2018-08-21 DIAGNOSIS — C50411 Malignant neoplasm of upper-outer quadrant of right female breast: Secondary | ICD-10-CM | POA: Diagnosis not present

## 2018-08-21 DIAGNOSIS — R51 Headache: Secondary | ICD-10-CM | POA: Diagnosis not present

## 2018-08-21 DIAGNOSIS — I6201 Nontraumatic acute subdural hemorrhage: Secondary | ICD-10-CM | POA: Diagnosis not present

## 2018-08-21 DIAGNOSIS — Z9181 History of falling: Secondary | ICD-10-CM

## 2018-08-21 DIAGNOSIS — S0511XA Contusion of eyeball and orbital tissues, right eye, initial encounter: Secondary | ICD-10-CM | POA: Diagnosis not present

## 2018-08-21 DIAGNOSIS — S0993XA Unspecified injury of face, initial encounter: Secondary | ICD-10-CM | POA: Diagnosis not present

## 2018-08-21 DIAGNOSIS — I6200 Nontraumatic subdural hemorrhage, unspecified: Secondary | ICD-10-CM | POA: Diagnosis not present

## 2018-08-21 DIAGNOSIS — F419 Anxiety disorder, unspecified: Secondary | ICD-10-CM | POA: Diagnosis not present

## 2018-08-21 DIAGNOSIS — R52 Pain, unspecified: Secondary | ICD-10-CM | POA: Diagnosis not present

## 2018-08-21 DIAGNOSIS — W19XXXD Unspecified fall, subsequent encounter: Secondary | ICD-10-CM | POA: Diagnosis not present

## 2018-08-21 LAB — CBC WITH DIFFERENTIAL/PLATELET
Abs Immature Granulocytes: 0.01 10*3/uL (ref 0.00–0.07)
Basophils Absolute: 0 10*3/uL (ref 0.0–0.1)
Basophils Relative: 1 %
Eosinophils Absolute: 0 10*3/uL (ref 0.0–0.5)
Eosinophils Relative: 0 %
HCT: 36.7 % (ref 36.0–46.0)
Hemoglobin: 12.3 g/dL (ref 12.0–15.0)
Immature Granulocytes: 0 %
Lymphocytes Relative: 26 %
Lymphs Abs: 0.7 10*3/uL (ref 0.7–4.0)
MCH: 34.7 pg — ABNORMAL HIGH (ref 26.0–34.0)
MCHC: 33.5 g/dL (ref 30.0–36.0)
MCV: 103.7 fL — ABNORMAL HIGH (ref 80.0–100.0)
Monocytes Absolute: 0.3 10*3/uL (ref 0.1–1.0)
Monocytes Relative: 11 %
Neutro Abs: 1.6 10*3/uL — ABNORMAL LOW (ref 1.7–7.7)
Neutrophils Relative %: 62 %
Platelets: 177 10*3/uL (ref 150–400)
RBC: 3.54 MIL/uL — ABNORMAL LOW (ref 3.87–5.11)
RDW: 14.6 % (ref 11.5–15.5)
WBC: 2.6 10*3/uL — ABNORMAL LOW (ref 4.0–10.5)
nRBC: 0 % (ref 0.0–0.2)

## 2018-08-21 LAB — BASIC METABOLIC PANEL
Anion gap: 9 (ref 5–15)
BUN: 9 mg/dL (ref 8–23)
CO2: 25 mmol/L (ref 22–32)
Calcium: 9.4 mg/dL (ref 8.9–10.3)
Chloride: 105 mmol/L (ref 98–111)
Creatinine, Ser: 0.76 mg/dL (ref 0.44–1.00)
GFR calc Af Amer: >60 mL/min (ref >60)
GFR calc non Af Amer: >60 mL/min (ref >60)
Glucose, Bld: 141 mg/dL — ABNORMAL HIGH (ref 70–99)
Potassium: 3.5 mmol/L (ref 3.5–5.1)
Sodium: 139 mmol/L (ref 135–145)

## 2018-08-21 LAB — CBG MONITORING, ED: Glucose-Capillary: 124 mg/dL — ABNORMAL HIGH (ref 70–99)

## 2018-08-21 LAB — URINALYSIS, ROUTINE W REFLEX MICROSCOPIC
Bilirubin Urine: NEGATIVE
Glucose, UA: 50 mg/dL — AB
Hgb urine dipstick: NEGATIVE
Ketones, ur: NEGATIVE mg/dL
Leukocytes,Ua: NEGATIVE
Nitrite: NEGATIVE
Protein, ur: NEGATIVE mg/dL
Specific Gravity, Urine: 1.008 (ref 1.005–1.030)
pH: 8 (ref 5.0–8.0)

## 2018-08-21 LAB — PROTIME-INR
INR: 1.1 (ref 0.8–1.2)
Prothrombin Time: 13.6 seconds (ref 11.4–15.2)

## 2018-08-21 LAB — SARS CORONAVIRUS 2 BY RT PCR (HOSPITAL ORDER, PERFORMED IN ~~LOC~~ HOSPITAL LAB): SARS Coronavirus 2: NEGATIVE

## 2018-08-21 MED ORDER — SODIUM CHLORIDE 0.9% FLUSH
3.0000 mL | Freq: Two times a day (BID) | INTRAVENOUS | Status: DC
  Administered 2018-08-22 – 2018-08-26 (×9): 3 mL via INTRAVENOUS

## 2018-08-21 MED ORDER — SODIUM CHLORIDE 0.9 % IV BOLUS
1000.0000 mL | Freq: Once | INTRAVENOUS | Status: AC
  Administered 2018-08-21: 1000 mL via INTRAVENOUS

## 2018-08-21 MED ORDER — ACETAMINOPHEN 325 MG PO TABS
650.0000 mg | ORAL_TABLET | Freq: Four times a day (QID) | ORAL | Status: DC | PRN
  Administered 2018-08-23 – 2018-08-25 (×2): 650 mg via ORAL
  Filled 2018-08-21 (×3): qty 2

## 2018-08-21 MED ORDER — MORPHINE SULFATE (PF) 2 MG/ML IV SOLN
2.0000 mg | INTRAVENOUS | Status: DC | PRN
  Administered 2018-08-22: 2 mg via INTRAVENOUS
  Filled 2018-08-21: qty 1

## 2018-08-21 MED ORDER — PROCHLORPERAZINE EDISYLATE 10 MG/2ML IJ SOLN
5.0000 mg | Freq: Once | INTRAMUSCULAR | Status: AC
  Administered 2018-08-21: 5 mg via INTRAVENOUS
  Filled 2018-08-21: qty 2

## 2018-08-21 MED ORDER — ANASTROZOLE 1 MG PO TABS
1.0000 mg | ORAL_TABLET | Freq: Every day | ORAL | Status: DC
  Administered 2018-08-22 – 2018-08-26 (×5): 1 mg via ORAL
  Filled 2018-08-21 (×6): qty 1

## 2018-08-21 MED ORDER — DOCUSATE SODIUM 100 MG PO CAPS
100.0000 mg | ORAL_CAPSULE | Freq: Two times a day (BID) | ORAL | Status: DC
  Administered 2018-08-21 – 2018-08-26 (×10): 100 mg via ORAL
  Filled 2018-08-21 (×10): qty 1

## 2018-08-21 MED ORDER — ACETAMINOPHEN 650 MG RE SUPP: 650.0000 mg | Freq: Four times a day (QID) | RECTAL | Status: DC | PRN

## 2018-08-21 MED ORDER — ONDANSETRON HCL 4 MG PO TABS: 4.0000 mg | ORAL_TABLET | Freq: Four times a day (QID) | ORAL | Status: DC | PRN

## 2018-08-21 MED ORDER — ONDANSETRON HCL 4 MG/2ML IJ SOLN: 4.0000 mg | Freq: Four times a day (QID) | INTRAMUSCULAR | Status: DC | PRN

## 2018-08-21 NOTE — Plan of Care (Signed)
  Problem: Education: Goal: Knowledge of General Education information will improve Description: Including pain rating scale, medication(s)/side effects and non-pharmacologic comfort measures Outcome: Progressing   Problem: Health Behavior/Discharge Planning: Goal: Ability to manage health-related needs will improve Outcome: Progressing   Problem: Clinical Measurements: Goal: Ability to maintain clinical measurements within normal limits will improve Outcome: Progressing Goal: Will remain free from infection Outcome: Progressing Goal: Diagnostic test results will improve Outcome: Progressing Goal: Respiratory complications will improve Outcome: Progressing Goal: Cardiovascular complication will be avoided Outcome: Progressing   Problem: Activity: Goal: Risk for activity intolerance will decrease Outcome: Progressing   Problem: Nutrition: Goal: Adequate nutrition will be maintained Outcome: Progressing   Problem: Coping: Goal: Level of anxiety will decrease Outcome: Progressing   Problem: Elimination: Goal: Will not experience complications related to bowel motility Outcome: Progressing Goal: Will not experience complications related to urinary retention Outcome: Progressing   Problem: Pain Managment: Goal: General experience of comfort will improve Outcome: Progressing   Problem: Safety: Goal: Ability to remain free from injury will improve Outcome: Progressing   Problem: Skin Integrity: Goal: Risk for impaired skin integrity will decrease Outcome: Progressing   Problem: Education: Goal: Knowledge of disease or condition will improve Outcome: Progressing Goal: Knowledge of secondary prevention will improve Outcome: Progressing   Problem: Coping: Goal: Will verbalize positive feelings about self Outcome: Progressing Goal: Will identify appropriate support needs Outcome: Progressing   Ival Bible, BSN, RN

## 2018-08-21 NOTE — Consult Note (Addendum)
Reason for Consult:Subdural hematoma Referring Physician: Dr. Vallery Mccann is an 85 y.o. female.  HPI: Patient was brought in via EMS. She has a history significant for breast cancer and dementia. The patient is not a good historian. She apparently lives with her daughter, but there is no family available at the bedside to confirm history. Per nurse, she was complaining of a right frontal headache. It is unknown whether the patient fell. She did tell me she is originally from [place], but was not clear at to how she arrived in [place].  Past Medical History:  Diagnosis Date  . Bilateral knee pain   . Breast cancer ([place])    diagnosed 11/2017  . Dementia ([place])   . Osteoporosis   . Urge incontinence of urine     Past Surgical History:  Procedure Laterality Date  . BREAST LUMPECTOMY WITH SENTINEL LYMPH NODE BIOPSY Right 2001  . BREAST LUMPECTOMY WITH SENTINEL LYMPH NODE BIOPSY Right 12/30/2017   Procedure: RIGHT BREAST LUMPECTOMY WITH SENTINEL LYMPH NODE BIOPSY ERAS PATHWAY;  Surgeon: Stark Klein, MD;  Location: [facility];  Service: General;  Laterality: Right;  . DENTAL SURGERY      Family History  Problem Relation Age of Onset  . Cancer Mother        breast    Social History:  reports that she has never smoked. She has never used smokeless tobacco. She reports that she does not drink alcohol or use drugs.  Allergies: No Known Allergies  Medications: I have reviewed the patient's current medications.  Results for orders placed or performed during the hospital encounter of 08/21/18 (from the past 48 hour(s))  CBG monitoring, ED     Status: Abnormal   Collection Time: 08/21/18 10:33 AM  Result Value Ref Range   Glucose-Capillary 124 (H) 70 - 99 mg/dL   Comment 1 Notify RN    Comment 2 Document in Chart   CBC with Differential     Status: Abnormal   Collection Time: 08/21/18 10:48 AM  Result Value Ref Range   WBC 2.6 (L) 4.0 - 10.5 K/uL   RBC 3.54 (L) 3.87 -  5.11 MIL/uL   Hemoglobin 12.3 12.0 - 15.0 g/dL   HCT 36.7 36.0 - 46.0 %   MCV 103.7 (H) 80.0 - 100.0 fL   MCH 34.7 (H) 26.0 - 34.0 pg   MCHC 33.5 30.0 - 36.0 g/dL   RDW 14.6 11.5 - 15.5 %   Platelets 177 150 - 400 K/uL   nRBC 0.0 0.0 - 0.2 %   Neutrophils Relative % 62 %   Neutro Abs 1.6 (L) 1.7 - 7.7 K/uL   Lymphocytes Relative 26 %   Lymphs Abs 0.7 0.7 - 4.0 K/uL   Monocytes Relative 11 %   Monocytes Absolute 0.3 0.1 - 1.0 K/uL   Eosinophils Relative 0 %   Eosinophils Absolute 0.0 0.0 - 0.5 K/uL   Basophils Relative 1 %   Basophils Absolute 0.0 0.0 - 0.1 K/uL   Immature Granulocytes 0 %   Abs Immature Granulocytes 0.01 0.00 - 0.07 K/uL    Comment: Performed at [facility], [address], [place], [place] [address]  Basic metabolic panel     Status: Abnormal   Collection Time: 08/21/18 10:48 AM  Result Value Ref Range   Sodium 139 135 - 145 mmol/L   Potassium 3.5 3.5 - 5.1 mmol/L   Chloride 105 98 - 111 mmol/L   CO2 25 22 -  32 mmol/L   Glucose, Bld 141 (H) 70 - 99 mg/dL   BUN 9 8 - 23 mg/dL   Creatinine, Ser 0.76 0.44 - 1.00 mg/dL   Calcium 9.4 8.9 - 10.3 mg/dL   GFR calc non Af Amer >60 >60 mL/min   GFR calc Af Amer >60 >60 mL/min   Anion gap 9 5 - 15    Comment: Performed at [facility] [address], [place], [place] [address]  Protime-INR     Status: None   Collection Time: 08/21/18 10:48 AM  Result Value Ref Range   Prothrombin Time 13.6 11.4 - 15.2 seconds   INR 1.1 0.8 - 1.2    Comment: (NOTE) INR goal varies based on device and disease states. Performed at [facility], [place] [address], [place], [place] [address]   Urinalysis, Routine w reflex microscopic     Status: Abnormal   Collection Time: 08/21/18 10:56 AM  Result Value Ref Range   Color, Urine YELLOW YELLOW   APPearance CLEAR CLEAR   Specific Gravity, Urine 1.008 1.005 - 1.030   pH 8.0 5.0 - 8.0   Glucose, UA 50 (A) NEGATIVE mg/dL   Hgb urine dipstick NEGATIVE NEGATIVE    Bilirubin Urine NEGATIVE NEGATIVE   Ketones, ur NEGATIVE NEGATIVE mg/dL   Protein, ur NEGATIVE NEGATIVE mg/dL   Nitrite NEGATIVE NEGATIVE   Leukocytes,Ua NEGATIVE NEGATIVE    Comment: Performed at [facility] [address] [place], [place] [address]    Ct Head Wo Contrast  Result Date: 08/21/2018 CLINICAL DATA:  Head trauma, minor. Bruising to the right side. Possible fall. Headache and lethargy. EXAM: CT HEAD WITHOUT CONTRAST CT MAXILLOFACIAL WITHOUT CONTRAST TECHNIQUE: Multidetector CT imaging of the head and maxillofacial structures were performed using the standard protocol without intravenous contrast. Multiplanar CT image reconstructions of the maxillofacial structures were also generated. COMPARISON:  None. FINDINGS: CT HEAD FINDINGS Brain: Extensive hyperdense extra-axial hemorrhage is present over the right hemisphere. This measures up to 11 mm on coronal imaging. There is significant mass effect with effacement of the sulci and midline shift of 4 mm at the foramen of Monro. There is partial effacement of the right lateral ventricle. No parenchymal or intraventricular hemorrhage is present. Acute cortical infarct is present. Basal ganglia are intact. The brainstem and cerebellum are normal. Vascular: Minimal atherosclerotic changes are present within the cavernous internal carotid arteries bilaterally. There is no hyperdense vessel. Skull: Calvarium is intact. No focal lytic or blastic lesions are present. No significant extracranial soft tissue injury is present. CT MAXILLOFACIAL FINDINGS Osseous: No acute or healing fractures are present. The bones are intact. Mandible is intact and located. Zygomatic arch is within normal limits bilaterally. Orbits: Mild right periorbital soft tissue swelling is present. There is no underlying fracture. The globes are within normal limits. Orbits are unremarkable. Cavernous sinus is within normal limits bilaterally. Sellar and suprasellar regions are  unremarkable. Sinuses: The right frontal sinus and anterior ethmoid air cells are opacified. There is no associated fracture. Minimal mucosal thickening is present in the more inferior left ethmoid air cells. The mastoid air cells are clear. Soft tissues: Mild right periorbital soft tissue swelling is present. This extends over the right cheek. There is no underlying fracture. IMPRESSION: 1. Large right extra-axial acute hemorrhage with mass effect and midline shift as described. Maximal thickness is 11 mm on coronal imaging with 4 mm of right-to-left midline shift. 2. No acute extracranial trauma. 3. Right  periorbital soft tissue swelling without underlying fracture or globe injury. 4. Atherosclerosis. These results were called by telephone at the time of interpretation on 08/21/2018 at 1:10 pm to Dr. Lennice Sites , who verbally acknowledged these results. Electronically Signed   By: San Morelle M.D.   On: 08/21/2018 13:17   Ct Maxillofacial Wo Contrast  Result Date: 08/21/2018 CLINICAL DATA:  Head trauma, minor. Bruising to the right side. Possible fall. Headache and lethargy. EXAM: CT HEAD WITHOUT CONTRAST CT MAXILLOFACIAL WITHOUT CONTRAST TECHNIQUE: Multidetector CT imaging of the head and maxillofacial structures were performed using the standard protocol without intravenous contrast. Multiplanar CT image reconstructions of the maxillofacial structures were also generated. COMPARISON:  None. FINDINGS: CT HEAD FINDINGS Brain: Extensive hyperdense extra-axial hemorrhage is present over the right hemisphere. This measures up to 11 mm on coronal imaging. There is significant mass effect with effacement of the sulci and midline shift of 4 mm at the foramen of Monro. There is partial effacement of the right lateral ventricle. No parenchymal or intraventricular hemorrhage is present. Acute cortical infarct is present. Basal ganglia are intact. The brainstem and cerebellum are normal. Vascular: Minimal  atherosclerotic changes are present within the cavernous internal carotid arteries bilaterally. There is no hyperdense vessel. Skull: Calvarium is intact. No focal lytic or blastic lesions are present. No significant extracranial soft tissue injury is present. CT MAXILLOFACIAL FINDINGS Osseous: No acute or healing fractures are present. The bones are intact. Mandible is intact and located. Zygomatic arch is within normal limits bilaterally. Orbits: Mild right periorbital soft tissue swelling is present. There is no underlying fracture. The globes are within normal limits. Orbits are unremarkable. Cavernous sinus is within normal limits bilaterally. Sellar and suprasellar regions are unremarkable. Sinuses: The right frontal sinus and anterior ethmoid air cells are opacified. There is no associated fracture. Minimal mucosal thickening is present in the more inferior left ethmoid air cells. The mastoid air cells are clear. Soft tissues: Mild right periorbital soft tissue swelling is present. This extends over the right cheek. There is no underlying fracture. IMPRESSION: 1. Large right extra-axial acute hemorrhage with mass effect and midline shift as described. Maximal thickness is 11 mm on coronal imaging with 4 mm of right-to-left midline shift. 2. No acute extracranial trauma. 3. Right periorbital soft tissue swelling without underlying fracture or globe injury. 4. Atherosclerosis. These results were called by telephone at the time of interpretation on 08/21/2018 at 1:10 pm to Dr. Lennice Sites , who verbally acknowledged these results. Electronically Signed   By: San Morelle M.D.   On: 08/21/2018 13:17    Review of Systems  Constitutional: Negative.   HENT: Negative.   Eyes: Negative.   Respiratory: Negative.   Cardiovascular: Negative.   Gastrointestinal: Negative.   Genitourinary: Positive for urgency. Negative for dysuria, flank pain, frequency and hematuria.  Musculoskeletal: Positive for  joint pain. Negative for back pain, myalgias and neck pain.  Skin: Negative.   Neurological: Positive for weakness and headaches. Negative for sensory change, speech change, focal weakness, seizures and loss of consciousness.  Endo/Heme/Allergies: Negative.   Psychiatric/Behavioral: Negative.    Blood pressure (!) 173/87, pulse 64, temperature 98.5 F (36.9 C), temperature source Oral, resp. rate 17, height 5\' 7"  (1.702 m), weight 81.2 kg, SpO2 99 %. Physical Exam  Constitutional: She appears well-developed and well-nourished.  HENT:  Head: Normocephalic.  Bruising around right eye  Eyes: Pupils are equal, round, and reactive to light. Conjunctivae and EOM are normal.  Neck:  Normal range of motion. Neck supple.  Cardiovascular: Normal rate and regular rhythm.  Respiratory: Effort normal and breath sounds normal.  GI: Soft. Bowel sounds are normal.  Musculoskeletal:        General: Tenderness present.     Right knee: She exhibits decreased range of motion and bony tenderness.     Left knee: She exhibits decreased range of motion and bony tenderness.  Neurological: She is alert. She has normal strength. She is disoriented. No cranial nerve deficit or sensory deficit. GCS eye subscore is 4. GCS verbal subscore is 4. GCS motor subscore is 6.  Oriented only to self  Skin: Skin is warm and dry.  Psychiatric: She has a normal mood and affect. Her behavior is normal. She expresses impulsivity. She exhibits abnormal recent memory.    Assessment/Plan: Patient with a large right-sided subdural hematoma. Will monitor overnight and repeat scan in the morning. No Neurosurgical intervention at this time.  I have seen the patient and reviewed her CT scan.  She has a fairly large acute right subdural hematoma with midline shift.  I do not know what her baseline is.  She is moderately confused but nonfocal.  There is no family members around to discuss how aggressive they want to be in her care, i.e.  whether they would consent for surgery.  Given her age and dementia there is significant risk of operative morbidity and mortality.  Arrangements are being made for her to be admitted for observation to the medicine service.  We will plan to repeat her CAT scan tomorrow.  She does not need urgent surgery as she may be near her baseline.  If she does need surgery it might be easier on her to do it in about a week or so via bur holes rather than soon be a craniotomy.  We will follow the patient.  Earle Gell, MD  Jocelyn Mccann 08/21/2018, 2:47 PM

## 2018-08-21 NOTE — ED Notes (Signed)
Pt has old bruising to R. Eye. Suggesting possible Hx of fall

## 2018-08-21 NOTE — ED Triage Notes (Signed)
Pt BIB [facility] d/t Frontal Migraine that started last PM w/ incr weakness. Pt seen lethargic per Family. Pt LKW 2100 08/20/2018  Hx Dementia

## 2018-08-21 NOTE — ED Notes (Signed)
ED TO INPATIENT HANDOFF REPORT  ED Nurse Name and Phone #: Harriette Bouillon [phone]  S Name/Age/Gender Jocelyn Mccann 85 y.o. female Room/Bed: 020C/020C  Code Status   Code Status: Full Code  Home/SNF/Other Home Patient oriented to: self Is this baseline? Yes   Triage Complete: Triage complete  Chief Complaint ha  Triage Note Pt BIB [facility] d/t Frontal Migraine that started last PM w/ incr weakness. Pt seen lethargic per Family. Pt LKW 2100 08/20/2018  Hx Dementia   Allergies No Known Allergies  Level of Care/Admitting Diagnosis ED Disposition    ED Disposition Condition Comment   Admit  Hospital Area: [facility] [[id number]]  Level of Care: Progressive [102]  I expect the patient will be discharged within 24 hours: No (not a candidate for 5C-Observation unit)  Covid Evaluation: Asymptomatic Screening Protocol (No Symptoms)  Diagnosis: SDH (subdural hematoma) ([place]) [035009]  Admitting Physician: Karmen Bongo [[id number]]  Attending Physician: Karmen Bongo [[id number]]  Bed request comments: Neuroprogressive please  PT Class (Do Not Modify): Observation [104]  PT Acc Code (Do Not Modify): Observation [10022]       B Medical/Surgery History Past Medical History:  Diagnosis Date  . Bilateral knee pain   . Breast cancer ([place])    diagnosed 11/2017  . Dementia ([place])   . Osteoporosis   . Urge incontinence of urine    Past Surgical History:  Procedure Laterality Date  . BREAST LUMPECTOMY WITH SENTINEL LYMPH NODE BIOPSY Right 2001  . BREAST LUMPECTOMY WITH SENTINEL LYMPH NODE BIOPSY Right 12/30/2017   Procedure: RIGHT BREAST LUMPECTOMY WITH SENTINEL LYMPH NODE BIOPSY ERAS PATHWAY;  Surgeon: Stark Klein, MD;  Location: [facility];  Service: General;  Laterality: Right;  . DENTAL SURGERY       A IV Location/Drains/Wounds Patient Lines/Drains/Airways Status   Active Line/Drains/Airways    Name:   Placement date:   Placement time:   Site:   Days:   Peripheral IV  08/21/18 Left Antecubital   08/21/18    -    Antecubital   less than 1   External Urinary Catheter   08/21/18    1115    -   less than 1   Incision (Closed) 12/30/17 Breast Right   12/30/17    1017     234          Intake/Output Last 24 hours No intake or output data in the 24 hours ending 08/21/18 1534  Labs/Imaging Results for orders placed or performed during the hospital encounter of 08/21/18 (from the past 48 hour(s))  CBG monitoring, ED     Status: Abnormal   Collection Time: 08/21/18 10:33 AM  Result Value Ref Range   Glucose-Capillary 124 (H) 70 - 99 mg/dL   Comment 1 Notify RN    Comment 2 Document in Chart   CBC with Differential     Status: Abnormal   Collection Time: 08/21/18 10:48 AM  Result Value Ref Range   WBC 2.6 (L) 4.0 - 10.5 K/uL   RBC 3.54 (L) 3.87 - 5.11 MIL/uL   Hemoglobin 12.3 12.0 - 15.0 g/dL   HCT 36.7 36.0 - 46.0 %   MCV 103.7 (H) 80.0 - 100.0 fL   MCH 34.7 (H) 26.0 - 34.0 pg   MCHC 33.5 30.0 - 36.0 g/dL   RDW 14.6 11.5 - 15.5 %   Platelets 177 150 - 400 K/uL   nRBC 0.0 0.0 - 0.2 %   Neutrophils Relative % 62 %  Neutro Abs 1.6 (L) 1.7 - 7.7 K/uL   Lymphocytes Relative 26 %   Lymphs Abs 0.7 0.7 - 4.0 K/uL   Monocytes Relative 11 %   Monocytes Absolute 0.3 0.1 - 1.0 K/uL   Eosinophils Relative 0 %   Eosinophils Absolute 0.0 0.0 - 0.5 K/uL   Basophils Relative 1 %   Basophils Absolute 0.0 0.0 - 0.1 K/uL   Immature Granulocytes 0 %   Abs Immature Granulocytes 0.01 0.00 - 0.07 K/uL    Comment: Performed at [facility] [address], [place], [place] [address]  Basic metabolic panel     Status: Abnormal   Collection Time: 08/21/18 10:48 AM  Result Value Ref Range   Sodium 139 135 - 145 mmol/L   Potassium 3.5 3.5 - 5.1 mmol/L   Chloride 105 98 - 111 mmol/L   CO2 25 22 - 32 mmol/L   Glucose, Bld 141 (H) 70 - 99 mg/dL   BUN 9 8 - 23 mg/dL   Creatinine, Ser 0.76 0.44 - 1.00 mg/dL   Calcium 9.4 8.9 - 10.3 mg/dL   GFR calc non Af Amer  >60 >60 mL/min   GFR calc Af Amer >60 >60 mL/min   Anion gap 9 5 - 15    Comment: Performed at [facility], [place] [address] [place], [place] [address]  Protime-INR     Status: None   Collection Time: 08/21/18 10:48 AM  Result Value Ref Range   Prothrombin Time 13.6 11.4 - 15.2 seconds   INR 1.1 0.8 - 1.2    Comment: (NOTE) INR goal varies based on device and disease states. Performed at [facility], [place] [address], [place], [place] [address]   Urinalysis, Routine w reflex microscopic     Status: Abnormal   Collection Time: 08/21/18 10:56 AM  Result Value Ref Range   Color, Urine YELLOW YELLOW   APPearance CLEAR CLEAR   Specific Gravity, Urine 1.008 1.005 - 1.030   pH 8.0 5.0 - 8.0   Glucose, UA 50 (A) NEGATIVE mg/dL   Hgb urine dipstick NEGATIVE NEGATIVE   Bilirubin Urine NEGATIVE NEGATIVE   Ketones, ur NEGATIVE NEGATIVE mg/dL   Protein, ur NEGATIVE NEGATIVE mg/dL   Nitrite NEGATIVE NEGATIVE   Leukocytes,Ua NEGATIVE NEGATIVE    Comment: Performed at [facility] [address] [place], [place] [address]  SARS Coronavirus 2 [facility] order, Performed in [facility] hospital lab) Nasopharyngeal Nasopharyngeal Swab     Status: None   Collection Time: 08/21/18  1:47 PM   Specimen: Nasopharyngeal Swab  Result Value Ref Range   SARS Coronavirus 2 NEGATIVE NEGATIVE    Comment: (NOTE) If result is NEGATIVE SARS-CoV-2 target nucleic acids are NOT DETECTED. The SARS-CoV-2 RNA is generally detectable in upper and lower  respiratory specimens during the acute phase of infection. The lowest  concentration of SARS-CoV-2 viral copies this assay can detect is 250  copies / mL. A negative result does not preclude SARS-CoV-2 infection  and should not be used as the sole basis for treatment or other  patient management decisions.  A negative result may occur with  improper specimen collection / handling, submission of specimen other  than nasopharyngeal swab, presence  of viral mutation(s) within the  areas targeted by this assay, and inadequate number of viral copies  (<250 copies / mL). A negative result must be combined with clinical  observations, patient history, and epidemiological information. If result is  POSITIVE SARS-CoV-2 target nucleic acids are DETECTED. The SARS-CoV-2 RNA is generally detectable in upper and lower  respiratory specimens dur ing the acute phase of infection.  Positive  results are indicative of active infection with SARS-CoV-2.  Clinical  correlation with patient history and other diagnostic information is  necessary to determine patient infection status.  Positive results do  not rule out bacterial infection or co-infection with other viruses. If result is PRESUMPTIVE POSTIVE SARS-CoV-2 nucleic acids MAY BE PRESENT.   A presumptive positive result was obtained on the submitted specimen  and confirmed on repeat testing.  While 2019 novel coronavirus  (SARS-CoV-2) nucleic acids may be present in the submitted sample  additional confirmatory testing may be necessary for epidemiological  and / or clinical management purposes  to differentiate between  SARS-CoV-2 and other Sarbecovirus currently known to infect humans.  If clinically indicated additional testing with an alternate test  methodology [phone]) is advised. The SARS-CoV-2 RNA is generally  detectable in upper and lower respiratory sp ecimens during the acute  phase of infection. The expected result is Negative. Fact Sheet for Patients:  StrictlyIdeas.no Fact Sheet for Healthcare Providers: BankingDealers.co.za This test is not yet approved or cleared by the [place] FDA and has been authorized for detection and/or diagnosis of SARS-CoV-2 by FDA under an Emergency Use Authorization (EUA).  This EUA will remain in effect (meaning this test can be used) for the duration of the COVID-19 declaration under Section  564(b)(1) of the Act, 21 U.S.C. section 360bbb-3(b)(1), unless the authorization is terminated or revoked sooner. Performed at [facility], [place] [address], [place], [place] [address]    Ct Head Wo Contrast  Result Date: 08/21/2018 CLINICAL DATA:  Head trauma, minor. Bruising to the right side. Possible fall. Headache and lethargy. EXAM: CT HEAD WITHOUT CONTRAST CT MAXILLOFACIAL WITHOUT CONTRAST TECHNIQUE: Multidetector CT imaging of the head and maxillofacial structures were performed using the standard protocol without intravenous contrast. Multiplanar CT image reconstructions of the maxillofacial structures were also generated. COMPARISON:  None. FINDINGS: CT HEAD FINDINGS Brain: Extensive hyperdense extra-axial hemorrhage is present over the right hemisphere. This measures up to 11 mm on coronal imaging. There is significant mass effect with effacement of the sulci and midline shift of 4 mm at the foramen of Monro. There is partial effacement of the right lateral ventricle. No parenchymal or intraventricular hemorrhage is present. Acute cortical infarct is present. Basal ganglia are intact. The brainstem and cerebellum are normal. Vascular: Minimal atherosclerotic changes are present within the cavernous internal carotid arteries bilaterally. There is no hyperdense vessel. Skull: Calvarium is intact. No focal lytic or blastic lesions are present. No significant extracranial soft tissue injury is present. CT MAXILLOFACIAL FINDINGS Osseous: No acute or healing fractures are present. The bones are intact. Mandible is intact and located. Zygomatic arch is within normal limits bilaterally. Orbits: Mild right periorbital soft tissue swelling is present. There is no underlying fracture. The globes are within normal limits. Orbits are unremarkable. Cavernous sinus is within normal limits bilaterally. Sellar and suprasellar regions are unremarkable. Sinuses: The right frontal sinus and anterior ethmoid air  cells are opacified. There is no associated fracture. Minimal mucosal thickening is present in the more inferior left ethmoid air cells. The mastoid air cells are clear. Soft tissues: Mild right periorbital soft tissue swelling is present. This extends over the right cheek. There is no underlying fracture. IMPRESSION: 1. Large right extra-axial acute hemorrhage with mass effect and midline shift  as described. Maximal thickness is 11 mm on coronal imaging with 4 mm of right-to-left midline shift. 2. No acute extracranial trauma. 3. Right periorbital soft tissue swelling without underlying fracture or globe injury. 4. Atherosclerosis. These results were called by telephone at the time of interpretation on 08/21/2018 at 1:10 pm to Dr. Lennice Sites , who verbally acknowledged these results. Electronically Signed   By: San Morelle M.D.   On: 08/21/2018 13:17   Ct Maxillofacial Wo Contrast  Result Date: 08/21/2018 CLINICAL DATA:  Head trauma, minor. Bruising to the right side. Possible fall. Headache and lethargy. EXAM: CT HEAD WITHOUT CONTRAST CT MAXILLOFACIAL WITHOUT CONTRAST TECHNIQUE: Multidetector CT imaging of the head and maxillofacial structures were performed using the standard protocol without intravenous contrast. Multiplanar CT image reconstructions of the maxillofacial structures were also generated. COMPARISON:  None. FINDINGS: CT HEAD FINDINGS Brain: Extensive hyperdense extra-axial hemorrhage is present over the right hemisphere. This measures up to 11 mm on coronal imaging. There is significant mass effect with effacement of the sulci and midline shift of 4 mm at the foramen of Monro. There is partial effacement of the right lateral ventricle. No parenchymal or intraventricular hemorrhage is present. Acute cortical infarct is present. Basal ganglia are intact. The brainstem and cerebellum are normal. Vascular: Minimal atherosclerotic changes are present within the cavernous internal carotid  arteries bilaterally. There is no hyperdense vessel. Skull: Calvarium is intact. No focal lytic or blastic lesions are present. No significant extracranial soft tissue injury is present. CT MAXILLOFACIAL FINDINGS Osseous: No acute or healing fractures are present. The bones are intact. Mandible is intact and located. Zygomatic arch is within normal limits bilaterally. Orbits: Mild right periorbital soft tissue swelling is present. There is no underlying fracture. The globes are within normal limits. Orbits are unremarkable. Cavernous sinus is within normal limits bilaterally. Sellar and suprasellar regions are unremarkable. Sinuses: The right frontal sinus and anterior ethmoid air cells are opacified. There is no associated fracture. Minimal mucosal thickening is present in the more inferior left ethmoid air cells. The mastoid air cells are clear. Soft tissues: Mild right periorbital soft tissue swelling is present. This extends over the right cheek. There is no underlying fracture. IMPRESSION: 1. Large right extra-axial acute hemorrhage with mass effect and midline shift as described. Maximal thickness is 11 mm on coronal imaging with 4 mm of right-to-left midline shift. 2. No acute extracranial trauma. 3. Right periorbital soft tissue swelling without underlying fracture or globe injury. 4. Atherosclerosis. These results were called by telephone at the time of interpretation on 08/21/2018 at 1:10 pm to Dr. Lennice Sites , who verbally acknowledged these results. Electronically Signed   By: San Morelle M.D.   On: 08/21/2018 13:17    Pending Labs Unresulted Labs (From admission, onward)    Start     Ordered   08/22/18 6659  Basic metabolic panel  Tomorrow morning,   R     08/21/18 1523   08/22/18 0500  CBC  Tomorrow morning,   R     08/21/18 1523          Vitals/Pain Today's Vitals   08/21/18 1028 08/21/18 1029 08/21/18 1030 08/21/18 1047  BP:    (!) 173/87  Pulse:    64  Resp:    17   Temp:  98.5 F (36.9 C)    TempSrc:  Oral    SpO2: 97%   99%  Weight:   81.2 kg   Height:   5'  7" (1.702 m)   PainSc:   7      Isolation Precautions No active isolations  Medications Medications  anastrozole (ARIMIDEX) tablet 1 mg (has no administration in time range)  acetaminophen (TYLENOL) tablet 650 mg (has no administration in time range)    Or  acetaminophen (TYLENOL) suppository 650 mg (has no administration in time range)  docusate sodium (COLACE) capsule 100 mg (has no administration in time range)  ondansetron (ZOFRAN) tablet 4 mg (has no administration in time range)    Or  ondansetron (ZOFRAN) injection 4 mg (has no administration in time range)  sodium chloride flush (NS) 0.9 % injection 3 mL (has no administration in time range)  morphine 2 MG/ML injection 2 mg (has no administration in time range)  sodium chloride 0.9 % bolus 1,000 mL (1,000 mLs Intravenous New Bag/Given 08/21/18 1102)  prochlorperazine (COMPAZINE) injection 5 mg (5 mg Intravenous Given 08/21/18 1102)    Mobility walks with person assist High fall risk   Focused Assessments Neuro Assessment Handoff:  Swallow screen pass? No  Cardiac Rhythm: Normal sinus rhythm NIH Stroke Scale ( + Modified Stroke Scale Criteria)  Interval: Initial Level of Consciousness (1a.)   : Alert, keenly responsive LOC Questions (1b. )   +: Answers one question correctly LOC Commands (1c. )   + : Performs both tasks correctly Best Gaze (2. )  +: Normal Visual (3. )  +: No visual loss Facial Palsy (4. )    : Normal symmetrical movements Motor Arm, Left (5a. )   +: No drift Motor Arm, Right (5b. )   +: No drift Motor Leg, Left (6a. )   +: No drift Motor Leg, Right (6b. )   +: No drift Limb Ataxia (7. ): Absent Sensory (8. )   +: Normal, no sensory loss Best Language (9. )   +: No aphasia Dysarthria (10. ): Normal Extinction/Inattention (11.)   +: No Abnormality Modified SS Total  +: 1 Complete NIHSS TOTAL:  1 Last date known well: 08/20/18 Last time known well: 2100 Neuro Assessment: Exceptions to WDL Neuro Checks:   Initial (08/21/18 1043)  Last Documented NIHSS Modified Score: 1 (08/21/18 1043) Has TPA been given? No If patient is a Neuro Trauma and patient is going to OR before floor call report to [place] nurse: [phone] or [phone]     R Recommendations: See Admitting Provider Note  Report given to:   Additional Notes:

## 2018-08-21 NOTE — H&P (Signed)
History and Physical    Jocelyn Mccann IPJ:[id number] DOB: 09/23/1928 DOA: 08/21/2018  PCP: Orlena Sheldon, PA-C Consultants:  Magrinat - oncology; Kinard - rad onc Patient coming from:  Home - lives with daughter; Donald Prose: Daughter, [name], [phone]; [phone]  Chief Complaint: headache  HPI: Jocelyn Mccann is a 85 y.o. female with medical history significant of dementia and recurrent breast cancer (2019, declined radiation, on anastrozole with some ?of compliance) presenting with headache.  She was lethargic in the bathroom.  She went into the bathroom and was out of it on the commode.  She complained of her head hurting.  She did not fall, according to her daughter.  The patient cannot remember what happened but reports pain along her R under eye region and through her frontal head.   ED Course:  Unable to get in touch with family.  Came in with headache.  74mm SDH, neurologically intact. Bruising around R eye, denies trauma/fall.  Neurosurgery thinks clinically stable, unable to consent regardless, monitor clinically for now.  Review of Systems: As per HPI; otherwise review of systems reviewed and negative.  Limited by dementia   Past Medical History:  Diagnosis Date   Bilateral knee pain    Breast cancer ([place])    diagnosed 11/2017   Dementia ([place])    Osteoporosis    Urge incontinence of urine     Past Surgical History:  Procedure Laterality Date   BREAST LUMPECTOMY WITH SENTINEL LYMPH NODE BIOPSY Right 2001   BREAST LUMPECTOMY WITH SENTINEL LYMPH NODE BIOPSY Right 12/30/2017   Procedure: RIGHT BREAST LUMPECTOMY WITH SENTINEL LYMPH NODE BIOPSY ERAS PATHWAY;  Surgeon: Stark Klein, MD;  Location: [facility];  Service: General;  Laterality: Right;   DENTAL SURGERY      Social History   Socioeconomic History   Marital status: Widowed    Spouse name: Not on file   Number of children: 3   Years of education: Not on file   Highest education level: Not on file    Occupational History   Not on file  Social Needs   Financial resource strain: Not on file   Food insecurity    Worry: Not on file    Inability: Not on file   Transportation needs    Medical: Not on file    Non-medical: Not on file  Tobacco Use   Smoking status: Never Smoker   Smokeless tobacco: Never Used  Substance and Sexual Activity   Alcohol use: Never    Frequency: Never   Drug use: Never   Sexual activity: Not on file  Lifestyle   Physical activity    Days per week: Not on file    Minutes per session: Not on file   Stress: Not on file  Relationships   Social connections    Talks on phone: Not on file    Gets together: Not on file    Attends religious service: Not on file    Active member of club or organization: Not on file    Attends meetings of clubs or organizations: Not on file    Relationship status: Not on file   Intimate partner violence    Fear of current or ex partner: Not on file    Emotionally abused: Not on file    Physically abused: Not on file    Forced sexual activity: Not on file  Other Topics Concern   Not on file  Social History Narrative   Not on file  No Known Allergies  Family History  Problem Relation Age of Onset   Cancer Mother        breast    Prior to Admission medications   Medication Sig Start Date End Date Taking? Authorizing Provider  anastrozole (ARIMIDEX) 1 MG tablet TAKE 1 TABLET BY MOUTH EVERY DAY Patient taking differently: Take 1 mg by mouth daily.  07/27/18  Yes Magrinat, Virgie Dad, MD  cholecalciferol (VITAMIN D3) 25 MCG (1000 UT) tablet Take 1 tablet (1,000 Units total) by mouth daily. 02/02/18  Yes Magrinat, Virgie Dad, MD  traMADol (ULTRAM) 50 MG tablet Take 1 tablet (50 mg total) by mouth every 6 (six) hours as needed for moderate pain. 12/30/17  Yes Stark Klein, MD  busPIRone (BUSPAR) 7.5 MG tablet TAKE 1 TABLET BY MOUTH EVERY DAY Patient not taking: No sig reported 05/05/17   Dena Billet B, PA-C   citalopram (CELEXA) 20 MG tablet TAKE 1 TABLET EVERY NIGHT AT BEDTIME Patient not taking: No sig reported 05/05/17   Dena Billet B, PA-C  meloxicam (MOBIC) 7.5 MG tablet TAKE 1 TABLET BY MOUTH EVERY DAY Patient not taking: No sig reported 09/14/17   Orlena Sheldon, PA-C  mirabegron ER (MYRBETRIQ) 50 MG TB24 tablet Take 1 tablet (50 mg total) by mouth daily. Patient not taking: Reported on 12/20/2017 03/31/17   Orlena Sheldon, PA-C    Physical Exam: Vitals:   08/21/18 1028 08/21/18 1029 08/21/18 1030 08/21/18 1047  BP:    (!) 173/87  Pulse:    64  Resp:    17  Temp:  98.5 F (36.9 C)    TempSrc:  Oral    SpO2: 97%   99%  Weight:   81.2 kg   Height:   5\' 7"  (1.702 m)       General:  Appears calm and comfortable and is NAD  Eyes:  PERRL, EOMI, normal lids, iris; ecchymosis primarily along under R eye and extending some laterally  ENT:  grossly normal hearing, lips & tongue, mmm  Neck:  no LAD, masses or thyromegaly  Cardiovascular:  RRR, no m/r/g. No LE edema.   Respiratory:   CTA bilaterally with no wheezes/rales/rhonchi.  Normal respiratory effort.  Abdomen:  soft, NT, ND, NABS  Skin:  no rash or induration seen on limited exam; R infraorbital ecchymosis, as described above  Musculoskeletal:  grossly normal tone BUE/BLE, good ROM, no bony abnormality  Psychiatric:  Pleasant but mildly confused mood and affect, speech fluent and appropriate, AO x 1-2  Neurologic:  CN 2-12 grossly intact, moves all extremities in coordinated fashion    Radiological Exams on Admission: Ct Head Wo Contrast  Result Date: 08/21/2018 CLINICAL DATA:  Head trauma, minor. Bruising to the right side. Possible fall. Headache and lethargy. EXAM: CT HEAD WITHOUT CONTRAST CT MAXILLOFACIAL WITHOUT CONTRAST TECHNIQUE: Multidetector CT imaging of the head and maxillofacial structures were performed using the standard protocol without intravenous contrast. Multiplanar CT image reconstructions of the  maxillofacial structures were also generated. COMPARISON:  None. FINDINGS: CT HEAD FINDINGS Brain: Extensive hyperdense extra-axial hemorrhage is present over the right hemisphere. This measures up to 11 mm on coronal imaging. There is significant mass effect with effacement of the sulci and midline shift of 4 mm at the foramen of Monro. There is partial effacement of the right lateral ventricle. No parenchymal or intraventricular hemorrhage is present. Acute cortical infarct is present. Basal ganglia are intact. The brainstem and cerebellum are normal. Vascular: Minimal atherosclerotic changes  are present within the cavernous internal carotid arteries bilaterally. There is no hyperdense vessel. Skull: Calvarium is intact. No focal lytic or blastic lesions are present. No significant extracranial soft tissue injury is present. CT MAXILLOFACIAL FINDINGS Osseous: No acute or healing fractures are present. The bones are intact. Mandible is intact and located. Zygomatic arch is within normal limits bilaterally. Orbits: Mild right periorbital soft tissue swelling is present. There is no underlying fracture. The globes are within normal limits. Orbits are unremarkable. Cavernous sinus is within normal limits bilaterally. Sellar and suprasellar regions are unremarkable. Sinuses: The right frontal sinus and anterior ethmoid air cells are opacified. There is no associated fracture. Minimal mucosal thickening is present in the more inferior left ethmoid air cells. The mastoid air cells are clear. Soft tissues: Mild right periorbital soft tissue swelling is present. This extends over the right cheek. There is no underlying fracture. IMPRESSION: 1. Large right extra-axial acute hemorrhage with mass effect and midline shift as described. Maximal thickness is 11 mm on coronal imaging with 4 mm of right-to-left midline shift. 2. No acute extracranial trauma. 3. Right periorbital soft tissue swelling without underlying fracture or  globe injury. 4. Atherosclerosis. These results were called by telephone at the time of interpretation on 08/21/2018 at 1:10 pm to Dr. Lennice Sites , who verbally acknowledged these results. Electronically Signed   By: San Morelle M.D.   On: 08/21/2018 13:17   Ct Maxillofacial Wo Contrast  Result Date: 08/21/2018 CLINICAL DATA:  Head trauma, minor. Bruising to the right side. Possible fall. Headache and lethargy. EXAM: CT HEAD WITHOUT CONTRAST CT MAXILLOFACIAL WITHOUT CONTRAST TECHNIQUE: Multidetector CT imaging of the head and maxillofacial structures were performed using the standard protocol without intravenous contrast. Multiplanar CT image reconstructions of the maxillofacial structures were also generated. COMPARISON:  None. FINDINGS: CT HEAD FINDINGS Brain: Extensive hyperdense extra-axial hemorrhage is present over the right hemisphere. This measures up to 11 mm on coronal imaging. There is significant mass effect with effacement of the sulci and midline shift of 4 mm at the foramen of Monro. There is partial effacement of the right lateral ventricle. No parenchymal or intraventricular hemorrhage is present. Acute cortical infarct is present. Basal ganglia are intact. The brainstem and cerebellum are normal. Vascular: Minimal atherosclerotic changes are present within the cavernous internal carotid arteries bilaterally. There is no hyperdense vessel. Skull: Calvarium is intact. No focal lytic or blastic lesions are present. No significant extracranial soft tissue injury is present. CT MAXILLOFACIAL FINDINGS Osseous: No acute or healing fractures are present. The bones are intact. Mandible is intact and located. Zygomatic arch is within normal limits bilaterally. Orbits: Mild right periorbital soft tissue swelling is present. There is no underlying fracture. The globes are within normal limits. Orbits are unremarkable. Cavernous sinus is within normal limits bilaterally. Sellar and suprasellar  regions are unremarkable. Sinuses: The right frontal sinus and anterior ethmoid air cells are opacified. There is no associated fracture. Minimal mucosal thickening is present in the more inferior left ethmoid air cells. The mastoid air cells are clear. Soft tissues: Mild right periorbital soft tissue swelling is present. This extends over the right cheek. There is no underlying fracture. IMPRESSION: 1. Large right extra-axial acute hemorrhage with mass effect and midline shift as described. Maximal thickness is 11 mm on coronal imaging with 4 mm of right-to-left midline shift. 2. No acute extracranial trauma. 3. Right periorbital soft tissue swelling without underlying fracture or globe injury. 4. Atherosclerosis. These results were called  by telephone at the time of interpretation on 08/21/2018 at 1:10 pm to Dr. Lennice Sites , who verbally acknowledged these results. Electronically Signed   By: San Morelle M.D.   On: 08/21/2018 13:17    EKG: not done   Labs on Admission: I have personally reviewed the available labs and imaging studies at the time of the admission.  Pertinent labs:   Glucose 141 WBC 2.6 INR 1.1 COVID negative UA: 50 glucose   Assessment/Plan Active Problems:   SDH (subdural hematoma) (HCC)   SDH -Patient with apparent fall at home given R periorbital swelling and ecchymosis appreciated on PE and also on CT -Large acute R SDH with midline shift -Her daughter was not present during evaluation by EDP or neurosurgery, but showed up just prior to my evaluation -She reports that she would desire the full spectrum of care including surgery, if necessary. -I have notified neurosurgery that the family is present now and desires to speak with them.  -For now, will observe in neuro-progressive unit -Repeat CT tomorrow  -Per neurosurgery, surgery is not urgently needed at this time and it may be easier to do Burr holes in a week rather than a craniotomy  sooner  Dementia -Baseline dementia, appears moderate-severe on exam today -Not on medication -Lives at home with family -Based on fall today and ER inability to reach family, SW consult was placed -Family may benefit from additional [facility] resources, if available  Hyperglycemia -A1c 5.7 in 2019 -She is unlikely to suffer long-term or short-terms effects associated with DM at this time and so will not further evaluate or treat at this time  Breast cancer -Patient with prior h/o breast cancer, with recurrence with stage IIB invasive ductal carcinoma diagnosed in 11/20 -She is supposed to be taking anastrozole daily although there was previously a compliance issue noted due to her dementia and refusal to take the medication -Patient/family declined radiation therapy -Plan for repeat mammogram in 11/20 and possible initiation of Prolia thereafter -Followed by Dr. Jana Hakim    Note: This patient has been tested and is negative for the novel coronavirus COVID-19.  DVT prophylaxis:  SCDs Code Status:  Full - confirmed with family Family Communication:  Daughter was present throughout my evaluation Disposition Plan:  Home once clinically improved Consults called: Neurosurgery  Admission status: It is my clinical opinion that referral for OBSERVATION is reasonable and necessary in this patient based on the above information provided. The aforementioned taken together are felt to place the patient at high risk for further clinical deterioration. However it is anticipated that the patient may be medically stable for discharge from the hospital within 24 to 48 hours.    Karmen Bongo MD [facility]   How to contact the [facility] Attending or Consulting provider [place] or covering provider during after hours [place], for this patient?  1. Check the care team in [facility] and look for a) attending/consulting [facility] provider listed and b) the [facility] team listed 2. Log into [email or web address] and use [facility]'s universal password to access. If you do not have the password, please contact the hospital operator. 3. Locate the [facility] provider you are looking for under [facility] and page to a number that you can be directly reached. 4. If you still have difficulty reaching the provider, please page the [facility] (Director on Call) for the Hospitalists listed on amion for assistance.   08/21/2018, 4:05 PM

## 2018-08-21 NOTE — Progress Notes (Signed)
CSW spoke with patient's RN Lattie Haw to discuss reason for consult. RN reports that patient has dementia and lives at home with her daughter, and that no family has been present with this patient. CSW will continue to monitor chart and assist with needs as they arise.  Madilyn Fireman, MSW, LCSW-A Clinical Social Worker [facility] [phone]

## 2018-08-21 NOTE — ED Notes (Signed)
Rn contactedPt's dtr [name] to informed Pt has arrived and is currently being assessed

## 2018-08-21 NOTE — ED Provider Notes (Signed)
[facility] EMERGENCY DEPARTMENT Provider Note   CSN: [id number] Arrival date & time: 08/21/18  1026    History   Chief Complaint Chief Complaint  Patient presents with   Migraine    HPI Jocelyn Mccann is a 85 y.o. female.     The history is provided by the patient.  Migraine This is a new problem. The current episode started yesterday. The problem has been gradually improving. Associated symptoms include headaches. Pertinent negatives include no chest pain, no abdominal pain and no shortness of breath. Nothing aggravates the symptoms. Nothing relieves the symptoms. She has tried nothing for the symptoms. The treatment provided no relief.    Past Medical History:  Diagnosis Date   Bilateral knee pain    Breast cancer ([place])    diagnosed 11/2017   Dementia ([place])    Osteoporosis    Urge incontinence of urine     Patient Active Problem List   Diagnosis Date Noted   Malignant neoplasm of upper-outer quadrant of right breast in female, estrogen receptor positive ([place]) 12/20/2017   Osteoarthritis of knee 06/01/2017   Bilateral knee pain 03/31/2017   Urge incontinence of urine 03/31/2017   Generalized anxiety disorder 03/31/2017   Osteoporosis without current pathological fracture 03/31/2017   Dementia ([place]) 03/31/2017    Past Surgical History:  Procedure Laterality Date   BREAST LUMPECTOMY WITH SENTINEL LYMPH NODE BIOPSY Right 2001   BREAST LUMPECTOMY WITH SENTINEL LYMPH NODE BIOPSY Right 12/30/2017   Procedure: RIGHT BREAST LUMPECTOMY WITH SENTINEL [place];  Surgeon: Stark Klein, MD;  Location: [place];  Service: General;  Laterality: Right;   DENTAL SURGERY       OB History   No obstetric history on file.      Home Medications    Prior to Admission medications   Medication Sig Start Date End Date Taking? Authorizing Provider  anastrozole (ARIMIDEX) 1 MG tablet TAKE 1 TABLET BY MOUTH EVERY DAY Patient  taking differently: Take 1 mg by mouth daily.  07/27/18  Yes Magrinat, Virgie Dad, MD  cholecalciferol (VITAMIN D3) 25 MCG (1000 UT) tablet Take 1 tablet (1,000 Units total) by mouth daily. 02/02/18  Yes Magrinat, Virgie Dad, MD  traMADol (ULTRAM) 50 MG tablet Take 1 tablet (50 mg total) by mouth every 6 (six) hours as needed for moderate pain. 12/30/17  Yes Stark Klein, MD  busPIRone (BUSPAR) 7.5 MG tablet TAKE 1 TABLET BY MOUTH EVERY DAY Patient not taking: No sig reported 05/05/17   Dena Billet B, PA-C  citalopram (CELEXA) 20 MG tablet TAKE 1 TABLET EVERY NIGHT AT BEDTIME Patient not taking: No sig reported 05/05/17   Dena Billet B, PA-C  meloxicam (MOBIC) 7.5 MG tablet TAKE 1 TABLET BY MOUTH EVERY DAY Patient not taking: No sig reported 09/14/17   Orlena Sheldon, PA-C  mirabegron ER (MYRBETRIQ) 50 MG TB24 tablet Take 1 tablet (50 mg total) by mouth daily. Patient not taking: Reported on 12/20/2017 03/31/17   Orlena Sheldon, PA-C    Family History Family History  Problem Relation Age of Onset   Cancer Mother        breast    Social History Social History   Tobacco Use   Smoking status: Never Smoker   Smokeless tobacco: Never Used  Substance Use Topics   Alcohol use: Never    Frequency: Never   Drug use: Never     Allergies   Patient has no known allergies.  Review of Systems Review of Systems  Constitutional: Negative for chills and fever.  HENT: Negative for ear pain and sore throat.   Eyes: Negative for photophobia, pain, discharge, redness, itching and visual disturbance.  Respiratory: Negative for cough and shortness of breath.   Cardiovascular: Negative for chest pain and palpitations.  Gastrointestinal: Negative for abdominal pain and vomiting.  Genitourinary: Negative for dysuria and hematuria.  Musculoskeletal: Negative for arthralgias and back pain.  Skin: Negative for color change and rash.  Neurological: Positive for headaches. Negative for tremors, seizures,  syncope, speech difficulty, weakness and numbness.  All other systems reviewed and are negative.    Physical Exam Updated Vital Signs BP (!) 173/87    Pulse 64    Temp 98.5 F (36.9 C) (Oral)    Resp 17    Ht 5\' 7"  (1.702 m)    Wt 81.2 kg    SpO2 99%    BMI 28.04 kg/m   Physical Exam Vitals signs and nursing note reviewed.  Constitutional:      General: She is not in acute distress.    Appearance: She is well-developed.  HENT:     Head: Normocephalic.     Comments: Bruising around the right eye    Nose: Nose normal.     Mouth/Throat:     Mouth: Mucous membranes are moist.  Eyes:     Extraocular Movements: Extraocular movements intact.     Conjunctiva/sclera: Conjunctivae normal.     Pupils: Pupils are equal, round, and reactive to light.  Neck:     Musculoskeletal: Normal range of motion and neck supple. No neck rigidity or muscular tenderness.  Cardiovascular:     Rate and Rhythm: Normal rate and regular rhythm.     Pulses: Normal pulses.     Heart sounds: Normal heart sounds. No murmur.  Pulmonary:     Effort: Pulmonary effort is normal. No respiratory distress.     Breath sounds: Normal breath sounds.  Abdominal:     Palpations: Abdomen is soft.     Tenderness: There is no abdominal tenderness.  Musculoskeletal: Normal range of motion.        General: No swelling or tenderness.  Skin:    General: Skin is warm and dry.     Capillary Refill: Capillary refill takes less than 2 seconds.  Neurological:     General: No focal deficit present.     Mental Status: She is alert.     Cranial Nerves: No cranial nerve deficit.     Sensory: No sensory deficit.     Motor: No weakness.     Coordination: Coordination normal.     Comments: 5+ out of 5 strength throughout, normal sensation, no drift, normal finger-to-nose finger, normal speech  Psychiatric:        Mood and Affect: Mood normal.      ED Treatments / Results  Labs (all labs ordered are listed, but only abnormal  results are displayed) Labs Reviewed  CBC WITH DIFFERENTIAL/PLATELET - Abnormal; Notable for the following components:      Result Value   WBC 2.6 (*)    RBC 3.54 (*)    MCV 103.7 (*)    MCH 34.7 (*)    Neutro Abs 1.6 (*)    All other components within normal limits  BASIC METABOLIC PANEL - Abnormal; Notable for the following components:   Glucose, Bld 141 (*)    All other components within normal limits  URINALYSIS, ROUTINE W REFLEX MICROSCOPIC -  Abnormal; Notable for the following components:   Glucose, UA 50 (*)    All other components within normal limits  CBG MONITORING, ED - Abnormal; Notable for the following components:   Glucose-Capillary 124 (*)    All other components within normal limits  SARS CORONAVIRUS 2 (HOSPITAL ORDER, [facility])  PROTIME-INR    EKG None  Radiology Ct Head Wo Contrast  Result Date: 08/21/2018 CLINICAL DATA:  Head trauma, minor. Bruising to the right side. Possible fall. Headache and lethargy. EXAM: CT HEAD WITHOUT CONTRAST CT MAXILLOFACIAL WITHOUT CONTRAST TECHNIQUE: Multidetector CT imaging of the head and maxillofacial structures were performed using the standard protocol without intravenous contrast. Multiplanar CT image reconstructions of the maxillofacial structures were also generated. COMPARISON:  None. FINDINGS: CT HEAD FINDINGS Brain: Extensive hyperdense extra-axial hemorrhage is present over the right hemisphere. This measures up to 11 mm on coronal imaging. There is significant mass effect with effacement of the sulci and midline shift of 4 mm at the foramen of Monro. There is partial effacement of the right lateral ventricle. No parenchymal or intraventricular hemorrhage is present. Acute cortical infarct is present. Basal ganglia are intact. The brainstem and cerebellum are normal. Vascular: Minimal atherosclerotic changes are present within the cavernous internal carotid arteries bilaterally. There is no  hyperdense vessel. Skull: Calvarium is intact. No focal lytic or blastic lesions are present. No significant extracranial soft tissue injury is present. CT MAXILLOFACIAL FINDINGS Osseous: No acute or healing fractures are present. The bones are intact. Mandible is intact and located. Zygomatic arch is within normal limits bilaterally. Orbits: Mild right periorbital soft tissue swelling is present. There is no underlying fracture. The globes are within normal limits. Orbits are unremarkable. Cavernous sinus is within normal limits bilaterally. Sellar and suprasellar regions are unremarkable. Sinuses: The right frontal sinus and anterior ethmoid air cells are opacified. There is no associated fracture. Minimal mucosal thickening is present in the more inferior left ethmoid air cells. The mastoid air cells are clear. Soft tissues: Mild right periorbital soft tissue swelling is present. This extends over the right cheek. There is no underlying fracture. IMPRESSION: 1. Large right extra-axial acute hemorrhage with mass effect and midline shift as described. Maximal thickness is 11 mm on coronal imaging with 4 mm of right-to-left midline shift. 2. No acute extracranial trauma. 3. Right periorbital soft tissue swelling without underlying fracture or globe injury. 4. Atherosclerosis. These results were called by telephone at the time of interpretation on 08/21/2018 at 1:10 pm to Dr. Lennice Sites , who verbally acknowledged these results. Electronically Signed   By: San Morelle M.D.   On: 08/21/2018 13:17   Ct Maxillofacial Wo Contrast  Result Date: 08/21/2018 CLINICAL DATA:  Head trauma, minor. Bruising to the right side. Possible fall. Headache and lethargy. EXAM: CT HEAD WITHOUT CONTRAST CT MAXILLOFACIAL WITHOUT CONTRAST TECHNIQUE: Multidetector CT imaging of the head and maxillofacial structures were performed using the standard protocol without intravenous contrast. Multiplanar CT image reconstructions of  the maxillofacial structures were also generated. COMPARISON:  None. FINDINGS: CT HEAD FINDINGS Brain: Extensive hyperdense extra-axial hemorrhage is present over the right hemisphere. This measures up to 11 mm on coronal imaging. There is significant mass effect with effacement of the sulci and midline shift of 4 mm at the foramen of Monro. There is partial effacement of the right lateral ventricle. No parenchymal or intraventricular hemorrhage is present. Acute cortical infarct is present. Basal ganglia are intact. The brainstem and cerebellum  are normal. Vascular: Minimal atherosclerotic changes are present within the cavernous internal carotid arteries bilaterally. There is no hyperdense vessel. Skull: Calvarium is intact. No focal lytic or blastic lesions are present. No significant extracranial soft tissue injury is present. CT MAXILLOFACIAL FINDINGS Osseous: No acute or healing fractures are present. The bones are intact. Mandible is intact and located. Zygomatic arch is within normal limits bilaterally. Orbits: Mild right periorbital soft tissue swelling is present. There is no underlying fracture. The globes are within normal limits. Orbits are unremarkable. Cavernous sinus is within normal limits bilaterally. Sellar and suprasellar regions are unremarkable. Sinuses: The right frontal sinus and anterior ethmoid air cells are opacified. There is no associated fracture. Minimal mucosal thickening is present in the more inferior left ethmoid air cells. The mastoid air cells are clear. Soft tissues: Mild right periorbital soft tissue swelling is present. This extends over the right cheek. There is no underlying fracture. IMPRESSION: 1. Large right extra-axial acute hemorrhage with mass effect and midline shift as described. Maximal thickness is 11 mm on coronal imaging with 4 mm of right-to-left midline shift. 2. No acute extracranial trauma. 3. Right periorbital soft tissue swelling without underlying fracture  or globe injury. 4. Atherosclerosis. These results were called by telephone at the time of interpretation on 08/21/2018 at 1:10 pm to Dr. Lennice Sites , who verbally acknowledged these results. Electronically Signed   By: San Morelle M.D.   On: 08/21/2018 13:17    Procedures .Critical Care Performed by: Lennice Sites, DO Authorized by: Lennice Sites, DO   Critical care provider statement:    Critical care time (minutes):  40   Critical care was necessary to treat or prevent imminent or life-threatening deterioration of the following conditions:  CNS failure or compromise   Critical care was time spent personally by me on the following activities:  Blood draw for specimens, development of treatment plan with patient or surrogate, discussions with primary provider, discussions with consultants, evaluation of patient's response to treatment, examination of patient, ordering and performing treatments and interventions, ordering and review of laboratory studies, ordering and review of radiographic studies, pulse oximetry, re-evaluation of patient's condition, review of old charts and obtaining history from patient or surrogate   (including critical care time)  Medications Ordered in ED Medications  sodium chloride 0.9 % bolus 1,000 mL (1,000 mLs Intravenous New Bag/Given 08/21/18 1102)  prochlorperazine (COMPAZINE) injection 5 mg (5 mg Intravenous Given 08/21/18 1102)     Initial Impression / Assessment and Plan / ED Course  I have reviewed the triage vital signs and the nursing notes.  Pertinent labs & imaging results that were available during my care of the patient were reviewed by me and considered in my medical decision making (see chart for details).     Jocelyn Mccann is an 85 year old female with history of dementia who presents the ED with headache.  Patient with overall unremarkable vitals.  No fever.  Patient with some bruising around the right eye.  She is pleasantly  demented.  She states mostly right-sided headache.  Denies any falls.  Family is not around to provide any information.  Multiple phone calls have been placed with no answers.  Will call social work to help out.  Patient neurologically is intact.  Will give Compazine and IV fluids for headache.  Will get head CT, face CT, basic labs.  Radiology called me in the phone to discuss CT findings with me.  Patient found to have 11  mm subdural hematoma with 4 mm of midline shift.  Otherwise imaging is unremarkable.  Patient continues to be neurologically stable.  I am unable to get in touch with family.  She does not have the ability to consent for any procedure.  Her medication list does not show any blood thinners.  She denies taking any.  INR is normal.  Talked with Dr. Arnoldo Morale with neurosurgery who states observation admission at this time.  Follow neurological exam and repeat head CT.  No need for surgical intervention at this time.  We will continue to try to get in touch with family to discuss this as well.  Will admit the patient to medicine service for further care.  This chart was dictated using voice recognition software.  Despite best efforts to proofread,  errors can occur which can change the documentation meaning.    Final Clinical Impressions(s) / ED Diagnoses   Final diagnoses:  SDH (subdural hematoma) [facility])    ED Discharge Orders    None       Lennice Sites, DO 08/21/18 1441

## 2018-08-22 ENCOUNTER — Observation Stay (HOSPITAL_COMMUNITY): Payer: Medicare HMO

## 2018-08-22 DIAGNOSIS — Z79811 Long term (current) use of aromatase inhibitors: Secondary | ICD-10-CM | POA: Diagnosis not present

## 2018-08-22 DIAGNOSIS — R29701 NIHSS score 1: Secondary | ICD-10-CM | POA: Diagnosis present

## 2018-08-22 DIAGNOSIS — W19XXXA Unspecified fall, initial encounter: Secondary | ICD-10-CM | POA: Diagnosis present

## 2018-08-22 DIAGNOSIS — R739 Hyperglycemia, unspecified: Secondary | ICD-10-CM | POA: Diagnosis present

## 2018-08-22 DIAGNOSIS — Z853 Personal history of malignant neoplasm of breast: Secondary | ICD-10-CM | POA: Diagnosis not present

## 2018-08-22 DIAGNOSIS — Z9181 History of falling: Secondary | ICD-10-CM | POA: Diagnosis not present

## 2018-08-22 DIAGNOSIS — I6200 Nontraumatic subdural hemorrhage, unspecified: Secondary | ICD-10-CM | POA: Diagnosis not present

## 2018-08-22 DIAGNOSIS — M25562 Pain in left knee: Secondary | ICD-10-CM | POA: Diagnosis present

## 2018-08-22 DIAGNOSIS — E876 Hypokalemia: Secondary | ICD-10-CM | POA: Diagnosis present

## 2018-08-22 DIAGNOSIS — S065X9A Traumatic subdural hemorrhage with loss of consciousness of unspecified duration, initial encounter: Secondary | ICD-10-CM | POA: Diagnosis present

## 2018-08-22 DIAGNOSIS — M810 Age-related osteoporosis without current pathological fracture: Secondary | ICD-10-CM | POA: Diagnosis present

## 2018-08-22 DIAGNOSIS — F0390 Unspecified dementia without behavioral disturbance: Secondary | ICD-10-CM | POA: Diagnosis present

## 2018-08-22 DIAGNOSIS — M25862 Other specified joint disorders, left knee: Secondary | ICD-10-CM | POA: Diagnosis not present

## 2018-08-22 DIAGNOSIS — S0011XA Contusion of right eyelid and periocular area, initial encounter: Secondary | ICD-10-CM | POA: Diagnosis present

## 2018-08-22 DIAGNOSIS — Y92009 Unspecified place in unspecified non-institutional (private) residence as the place of occurrence of the external cause: Secondary | ICD-10-CM | POA: Diagnosis not present

## 2018-08-22 DIAGNOSIS — Z20828 Contact with and (suspected) exposure to other viral communicable diseases: Secondary | ICD-10-CM | POA: Diagnosis present

## 2018-08-22 DIAGNOSIS — S065X0A Traumatic subdural hemorrhage without loss of consciousness, initial encounter: Secondary | ICD-10-CM | POA: Diagnosis present

## 2018-08-22 LAB — CBC
HCT: 33.8 % — ABNORMAL LOW (ref 36.0–46.0)
Hemoglobin: 11.6 g/dL — ABNORMAL LOW (ref 12.0–15.0)
MCH: 35 pg — ABNORMAL HIGH (ref 26.0–34.0)
MCHC: 34.3 g/dL (ref 30.0–36.0)
MCV: 102.1 fL — ABNORMAL HIGH (ref 80.0–100.0)
Platelets: 179 10*3/uL (ref 150–400)
RBC: 3.31 MIL/uL — ABNORMAL LOW (ref 3.87–5.11)
RDW: 14.5 % (ref 11.5–15.5)
WBC: 2.9 10*3/uL — ABNORMAL LOW (ref 4.0–10.5)
nRBC: 0 % (ref 0.0–0.2)

## 2018-08-22 LAB — BASIC METABOLIC PANEL
Anion gap: 10 (ref 5–15)
BUN: 12 mg/dL (ref 8–23)
CO2: 26 mmol/L (ref 22–32)
Calcium: 9.3 mg/dL (ref 8.9–10.3)
Chloride: 103 mmol/L (ref 98–111)
Creatinine, Ser: 0.89 mg/dL (ref 0.44–1.00)
GFR calc Af Amer: >60 mL/min (ref >60)
GFR calc non Af Amer: 58 mL/min — ABNORMAL LOW (ref >60)
Glucose, Bld: 104 mg/dL — ABNORMAL HIGH (ref 70–99)
Potassium: 3.4 mmol/L — ABNORMAL LOW (ref 3.5–5.1)
Sodium: 139 mmol/L (ref 135–145)

## 2018-08-22 LAB — TROPONIN I (HIGH SENSITIVITY): Troponin I (High Sensitivity): 9 ng/L (ref <18)

## 2018-08-22 MED ORDER — POTASSIUM CHLORIDE CRYS ER 20 MEQ PO TBCR: 40.0000 meq | EXTENDED_RELEASE_TABLET | Freq: Once | ORAL | Status: DC

## 2018-08-22 MED ORDER — POTASSIUM CHLORIDE CRYS ER 20 MEQ PO TBCR
40.0000 meq | EXTENDED_RELEASE_TABLET | ORAL | Status: AC
  Administered 2018-08-22 (×2): 40 meq via ORAL
  Filled 2018-08-22 (×2): qty 2

## 2018-08-22 MED ORDER — LABETALOL HCL 5 MG/ML IV SOLN
10.0000 mg | INTRAVENOUS | Status: DC | PRN
  Administered 2018-08-22 – 2018-08-24 (×2): 10 mg via INTRAVENOUS
  Filled 2018-08-22 (×2): qty 4

## 2018-08-22 MED ORDER — TRAMADOL HCL 50 MG PO TABS
50.0000 mg | ORAL_TABLET | Freq: Four times a day (QID) | ORAL | Status: DC | PRN
  Administered 2018-08-22 – 2018-08-23 (×2): 50 mg via ORAL
  Filled 2018-08-22 (×2): qty 1

## 2018-08-22 NOTE — Progress Notes (Signed)
Schorr informed of pt's SBP 163/91.

## 2018-08-22 NOTE — Plan of Care (Signed)
  Problem: Education: Goal: Knowledge of General Education information will improve Description: Including pain rating scale, medication(s)/side effects and non-pharmacologic comfort measures Outcome: Progressing   Problem: Health Behavior/Discharge Planning: Goal: Ability to manage health-related needs will improve Outcome: Progressing   Problem: Clinical Measurements: Goal: Ability to maintain clinical measurements within normal limits will improve Outcome: Progressing Goal: Will remain free from infection Outcome: Progressing Goal: Diagnostic test results will improve Outcome: Progressing Goal: Respiratory complications will improve Outcome: Progressing Goal: Cardiovascular complication will be avoided Outcome: Progressing   Problem: Activity: Goal: Risk for activity intolerance will decrease Outcome: Progressing   Problem: Nutrition: Goal: Adequate nutrition will be maintained Outcome: Progressing   Problem: Coping: Goal: Level of anxiety will decrease Outcome: Progressing   Problem: Elimination: Goal: Will not experience complications related to bowel motility Outcome: Progressing Goal: Will not experience complications related to urinary retention Outcome: Progressing   Problem: Pain Managment: Goal: General experience of comfort will improve Outcome: Progressing   Problem: Safety: Goal: Ability to remain free from injury will improve Outcome: Progressing   Problem: Skin Integrity: Goal: Risk for impaired skin integrity will decrease Outcome: Progressing   Problem: Education: Goal: Knowledge of disease or condition will improve Outcome: Progressing Goal: Knowledge of secondary prevention will improve Outcome: Progressing   Problem: Coping: Goal: Will verbalize positive feelings about self Outcome: Progressing Goal: Will identify appropriate support needs Outcome: Progressing   Ival Bible, BSN, RN

## 2018-08-22 NOTE — Progress Notes (Signed)
PROGRESS NOTE    Jocelyn Mccann  UKG:[id number] DOB: March 08, 1928 DOA: 08/21/2018 PCP: Rennis Golden    Brief Narrative:  85 year old female with history of dementia, breast cancer currently on anastrozole, from home presented to the emergency room with fall at home and complained of headache.  Patient with dementia, unable to provide any history, mostly complaining of headache and left knee pain.  In the emergency room hemodynamically stable.  Found to have 11 mm subdural hematoma on the right side, no acute neurological deficits.  Not on any antithrombotic or antiplatelets.  Admitted in consultation with neurosurgery to the hospital.  Remains a stable.   Assessment & Plan:   Active Problems:   SDH (subdural hematoma) (HCC)  Bilateral subdural hematoma, right more than left with minimal midline shift with no systemic neurological deficit: Clinically stabilizing. Continue neurochecks and monitor for any symptoms. Followed by neurosurgery, recommended no need for acute surgical intervention. Allow diet.  Work with PT OT.  Fall precautions.  Delirium precautions. She will have follow-up with neurosurgery, will decide timing for any repeat imaging studies.  Advanced dementia:Lives at home with family.  All-time fall precautions.  Will work with PT OT.  Hypokalemia: Replaced.  Patient has significant medical disease, large subdural hematoma needing frequent neuro checks in hospital level of care.  Anticipate to stay in the hospital more than 2 midnights.   DVT prophylaxis: SCDs Code Status: Full code Family Communication: daughter on phone. Will update at bedside. Disposition Plan: Admit telemetry.  SNF versus home with family.   Consultants:   Neurosurgery  Procedures:   None  Antimicrobials:   None   Subjective: Patient seen and examined.  Needed safety sitter at the bedside because of impulsiveness and risk of fall. Patient asked me to give her a cup of  tea. She states she has some headache mostly in the back. Complained of left knee pain, repeated knee x-ray and does not show any fracture.  Objective: Vitals:   08/22/18 0408 08/22/18 0541 08/22/18 0749 08/22/18 1101  BP: (!) 141/71 (!) 152/76 (!) 171/84 (!) 151/78  Pulse: 80 67 76 75  Resp: 18 18 20 18   Temp: 98.2 F (36.8 C) 97.8 F (36.6 C) 98.2 F (36.8 C) 98.6 F (37 C)  TempSrc: Oral Oral Oral Oral  SpO2: 100% 100% 100% 98%  Weight:      Height:        Intake/Output Summary (Last 24 hours) at 08/22/2018 1246 Last data filed at 08/22/2018 1224 Gross per 24 hour  Intake 462 ml  Output --  Net 462 ml   Filed Weights   08/21/18 1030  Weight: 81.2 kg    Examination:  General exam: Appears calm and comfortable, pleasantly confused.  No visible deformity or fractures. Respiratory system: Clear to auscultation. Respiratory effort normal. Cardiovascular system: S1 & S2 heard, RRR. No JVD, murmurs, rubs, gallops or clicks. No pedal edema. Gastrointestinal system: Abdomen is nondistended, soft and nontender. No organomegaly or masses felt. Normal bowel sounds heard. Central nervous system: Alert but not oriented.  No focal neurological deficits. Extremities: Symmetric 5 x 5 power.  She has some superficial tenderness along the medial aspect of the left knee. Skin: No rashes, lesions or ulcers Psychiatry: Judgement and insight appear impaired.  Mood & affect appropriate.     Data Reviewed: I have personally reviewed following labs and imaging studies  CBC: Recent Labs  Lab 08/21/18 1048 08/22/18 0333  WBC 2.6* 2.9*  NEUTROABS  1.6*  --   HGB 12.3 11.6*  HCT 36.7 33.8*  MCV 103.7* 102.1*  PLT 177 253   Basic Metabolic Panel: Recent Labs  Lab 08/21/18 1048 08/22/18 0333  NA 139 139  K 3.5 3.4*  CL 105 103  CO2 25 26  GLUCOSE 141* 104*  BUN 9 12  CREATININE 0.76 0.89  CALCIUM 9.4 9.3   GFR: Estimated Creatinine Clearance: 47.9 mL/min (by C-G formula  based on SCr of 0.89 mg/dL). Liver Function Tests: No results for input(s): AST, ALT, ALKPHOS, BILITOT, PROT, ALBUMIN in the last 168 hours. No results for input(s): LIPASE, AMYLASE in the last 168 hours. No results for input(s): AMMONIA in the last 168 hours. Coagulation Profile: Recent Labs  Lab 08/21/18 1048  INR 1.1   Cardiac Enzymes: No results for input(s): CKTOTAL, CKMB, CKMBINDEX, TROPONINI in the last 168 hours. BNP (last 3 results) No results for input(s): PROBNP in the last 8760 hours. HbA1C: No results for input(s): HGBA1C in the last 72 hours. CBG: Recent Labs  Lab 08/21/18 1033  GLUCAP 124*   Lipid Profile: No results for input(s): CHOL, HDL, LDLCALC, TRIG, CHOLHDL, LDLDIRECT in the last 72 hours. Thyroid Function Tests: No results for input(s): TSH, T4TOTAL, FREET4, T3FREE, THYROIDAB in the last 72 hours. Anemia Panel: No results for input(s): VITAMINB12, FOLATE, FERRITIN, TIBC, IRON, RETICCTPCT in the last 72 hours. Sepsis Labs: No results for input(s): PROCALCITON, LATICACIDVEN in the last 168 hours.  Recent Results (from the past 240 hour(s))  SARS Coronavirus 2 [facility] order, Performed in [facility] hospital lab) Nasopharyngeal Nasopharyngeal Swab     Status: None   Collection Time: 08/21/18  1:47 PM   Specimen: Nasopharyngeal Swab  Result Value Ref Range Status   SARS Coronavirus 2 NEGATIVE NEGATIVE Final    Comment: (NOTE) If result is NEGATIVE SARS-CoV-2 target nucleic acids are NOT DETECTED. The SARS-CoV-2 RNA is generally detectable in upper and lower  respiratory specimens during the acute phase of infection. The lowest  concentration of SARS-CoV-2 viral copies this assay can detect is 250  copies / mL. A negative result does not preclude SARS-CoV-2 infection  and should not be used as the sole basis for treatment or other  patient management decisions.  A negative result may occur with  improper specimen collection / handling, submission of  specimen other  than nasopharyngeal swab, presence of viral mutation(s) within the  areas targeted by this assay, and inadequate number of viral copies  (<250 copies / mL). A negative result must be combined with clinical  observations, patient history, and epidemiological information. If result is POSITIVE SARS-CoV-2 target nucleic acids are DETECTED. The SARS-CoV-2 RNA is generally detectable in upper and lower  respiratory specimens dur ing the acute phase of infection.  Positive  results are indicative of active infection with SARS-CoV-2.  Clinical  correlation with patient history and other diagnostic information is  necessary to determine patient infection status.  Positive results do  not rule out bacterial infection or co-infection with other viruses. If result is PRESUMPTIVE POSTIVE SARS-CoV-2 nucleic acids MAY BE PRESENT.   A presumptive positive result was obtained on the submitted specimen  and confirmed on repeat testing.  While 2019 novel coronavirus  (SARS-CoV-2) nucleic acids may be present in the submitted sample  additional confirmatory testing may be necessary for epidemiological  and / or clinical management purposes  to differentiate between  SARS-CoV-2 and other Sarbecovirus currently known to infect humans.  If clinically indicated  additional testing with an alternate test  methodology [phone]) is advised. The SARS-CoV-2 RNA is generally  detectable in upper and lower respiratory sp ecimens during the acute  phase of infection. The expected result is Negative. Fact Sheet for Patients:  StrictlyIdeas.no Fact Sheet for Healthcare Providers: BankingDealers.co.za This test is not yet approved or cleared by the [place] FDA and has been authorized for detection and/or diagnosis of SARS-CoV-2 by FDA under an Emergency Use Authorization (EUA).  This EUA will remain in effect (meaning this test can be used) for the  duration of the COVID-19 declaration under Section 564(b)(1) of the Act, 21 U.S.C. section 360bbb-3(b)(1), unless the authorization is terminated or revoked sooner. Performed at [facility], [place] [address] [place], [place] [address]          Radiology Studies: Dg Knee 1-2 Views Left  Result Date: 08/22/2018 CLINICAL DATA:  Medial LEFT knee pain, possible fall. EXAM: LEFT KNEE - 1-2 VIEW COMPARISON:  None. FINDINGS: Advanced degenerative change at the lateral compartment with abutment of the lateral femoral condyle and lateral tibial plateau and with associated articular surface sclerosis and osteophyte formation. Medial compartment is relatively well preserved. No acute appearing osseous abnormality. No fracture line or displaced fracture fragment seen. Probable small joint effusion within the suprapatellar bursa. Soft tissues about the LEFT knee are unremarkable. IMPRESSION: 1. Advanced degenerative change at the lateral compartment, as detailed above. 2. No acute appearing osseous abnormality. No fracture or acute dislocation. 3. Probable small joint effusion. Electronically Signed   By: Franki Cabot M.D.   On: 08/22/2018 10:46   Ct Head Wo Contrast  Result Date: 08/22/2018 CLINICAL DATA:  85 year old female with subdural hemorrhage follow-up. EXAM: CT HEAD WITHOUT CONTRAST TECHNIQUE: Contiguous axial images were obtained from the base of the skull through the vertex without intravenous contrast. COMPARISON:  Head CT dated 08/21/2018 FINDINGS: Brain: Right hemispheric subdural hemorrhage measure up to 8 mm in thickness on the axial view and 11 mm on the coronal view over the right temporal lobe. There is layering of the subdural hemorrhage along the right side of the tentorium. There has been interval development of a left-sided subdural hemorrhage over the left temporal lobe measuring approximately 6 mm in thickness. There is mass effect and approximately 5 mm right to left midline  shift. No transtentorial herniation. A small posterior pelvis seen subdural hemorrhage is also noted. No intraventricular hemorrhage. There is moderate age-related atrophy and chronic microvascular ischemic changes. Vascular: No hyperdense vessel or unexpected calcification. Skull: Normal. Negative for fracture or focal lesion. Sinuses/Orbits: There is opacification of the right frontal sinus as well as opacification of multiple ethmoid air cells. No air-fluid level. The mastoid air cells are clear. Other: None IMPRESSION: 1. Right hemispheric subdural hemorrhage with no significant interval change in size since the prior CT and appears redistributed along the right tentorium and posterior falx. 2. Interval development of a left subdural hemorrhage over the temporal lobe. 3. Mass effect and approximately 5 mm left-to-right midline shift. 4. Continued close follow-up recommended. These results were called by telephone at the time of interpretation on 08/22/2018 at 12:50 am to nurse practitioner Hollace Hayward who verbally acknowledged these results. Electronically Signed   By: Anner Crete M.D.   On: 08/22/2018 00:59   Ct Head Wo Contrast  Result Date: 08/21/2018 CLINICAL DATA:  Head trauma, minor. Bruising to the right side. Possible fall. Headache and lethargy. EXAM: CT HEAD WITHOUT CONTRAST CT MAXILLOFACIAL WITHOUT CONTRAST  TECHNIQUE: Multidetector CT imaging of the head and maxillofacial structures were performed using the standard protocol without intravenous contrast. Multiplanar CT image reconstructions of the maxillofacial structures were also generated. COMPARISON:  None. FINDINGS: CT HEAD FINDINGS Brain: Extensive hyperdense extra-axial hemorrhage is present over the right hemisphere. This measures up to 11 mm on coronal imaging. There is significant mass effect with effacement of the sulci and midline shift of 4 mm at the foramen of Monro. There is partial effacement of the right lateral ventricle.  No parenchymal or intraventricular hemorrhage is present. Acute cortical infarct is present. Basal ganglia are intact. The brainstem and cerebellum are normal. Vascular: Minimal atherosclerotic changes are present within the cavernous internal carotid arteries bilaterally. There is no hyperdense vessel. Skull: Calvarium is intact. No focal lytic or blastic lesions are present. No significant extracranial soft tissue injury is present. CT MAXILLOFACIAL FINDINGS Osseous: No acute or healing fractures are present. The bones are intact. Mandible is intact and located. Zygomatic arch is within normal limits bilaterally. Orbits: Mild right periorbital soft tissue swelling is present. There is no underlying fracture. The globes are within normal limits. Orbits are unremarkable. Cavernous sinus is within normal limits bilaterally. Sellar and suprasellar regions are unremarkable. Sinuses: The right frontal sinus and anterior ethmoid air cells are opacified. There is no associated fracture. Minimal mucosal thickening is present in the more inferior left ethmoid air cells. The mastoid air cells are clear. Soft tissues: Mild right periorbital soft tissue swelling is present. This extends over the right cheek. There is no underlying fracture. IMPRESSION: 1. Large right extra-axial acute hemorrhage with mass effect and midline shift as described. Maximal thickness is 11 mm on coronal imaging with 4 mm of right-to-left midline shift. 2. No acute extracranial trauma. 3. Right periorbital soft tissue swelling without underlying fracture or globe injury. 4. Atherosclerosis. These results were called by telephone at the time of interpretation on 08/21/2018 at 1:10 pm to Dr. Lennice Sites , who verbally acknowledged these results. Electronically Signed   By: San Morelle M.D.   On: 08/21/2018 13:17   Ct Maxillofacial Wo Contrast  Result Date: 08/21/2018 CLINICAL DATA:  Head trauma, minor. Bruising to the right side. Possible  fall. Headache and lethargy. EXAM: CT HEAD WITHOUT CONTRAST CT MAXILLOFACIAL WITHOUT CONTRAST TECHNIQUE: Multidetector CT imaging of the head and maxillofacial structures were performed using the standard protocol without intravenous contrast. Multiplanar CT image reconstructions of the maxillofacial structures were also generated. COMPARISON:  None. FINDINGS: CT HEAD FINDINGS Brain: Extensive hyperdense extra-axial hemorrhage is present over the right hemisphere. This measures up to 11 mm on coronal imaging. There is significant mass effect with effacement of the sulci and midline shift of 4 mm at the foramen of Monro. There is partial effacement of the right lateral ventricle. No parenchymal or intraventricular hemorrhage is present. Acute cortical infarct is present. Basal ganglia are intact. The brainstem and cerebellum are normal. Vascular: Minimal atherosclerotic changes are present within the cavernous internal carotid arteries bilaterally. There is no hyperdense vessel. Skull: Calvarium is intact. No focal lytic or blastic lesions are present. No significant extracranial soft tissue injury is present. CT MAXILLOFACIAL FINDINGS Osseous: No acute or healing fractures are present. The bones are intact. Mandible is intact and located. Zygomatic arch is within normal limits bilaterally. Orbits: Mild right periorbital soft tissue swelling is present. There is no underlying fracture. The globes are within normal limits. Orbits are unremarkable. Cavernous sinus is within normal limits bilaterally. Sellar and suprasellar  regions are unremarkable. Sinuses: The right frontal sinus and anterior ethmoid air cells are opacified. There is no associated fracture. Minimal mucosal thickening is present in the more inferior left ethmoid air cells. The mastoid air cells are clear. Soft tissues: Mild right periorbital soft tissue swelling is present. This extends over the right cheek. There is no underlying fracture. IMPRESSION:  1. Large right extra-axial acute hemorrhage with mass effect and midline shift as described. Maximal thickness is 11 mm on coronal imaging with 4 mm of right-to-left midline shift. 2. No acute extracranial trauma. 3. Right periorbital soft tissue swelling without underlying fracture or globe injury. 4. Atherosclerosis. These results were called by telephone at the time of interpretation on 08/21/2018 at 1:10 pm to Dr. Lennice Sites , who verbally acknowledged these results. Electronically Signed   By: San Morelle M.D.   On: 08/21/2018 13:17        Scheduled Meds:  anastrozole  1 mg Oral Daily   docusate sodium  100 mg Oral BID   sodium chloride flush  3 mL Intravenous Q12H   Continuous Infusions:   LOS: 0 days    Time spent: 25 minutes.    Barb Merino, MD [facility] Pager [phone]  If 7PM-7AM, please contact night-coverage [email or web address] Password [email or web address] 08/22/2018, 12:46 PM

## 2018-08-22 NOTE — Progress Notes (Signed)
Per telemetry ST segment 2. Also notice [id number]. Lennox Grumbles, NP informed.

## 2018-08-22 NOTE — Progress Notes (Addendum)
Providing Compassionate, Quality Care - Together   Subjective: Safety sitter at bedside. Patient is resting comfortably. No issues overnight. CT scan completed early this morning.  Objective: Vital signs in last 24 hours: Temp:  [97.5 F (36.4 C)-99.2 F (37.3 C)] 98.2 F (36.8 C) (08/11 0749) Pulse Rate:  [62-95] 76 (08/11 0749) Resp:  [17-20] 20 (08/11 0749) BP: (138-173)/(67-105) 171/84 (08/11 0749) SpO2:  [97 %-100 %] 100 % (08/11 0749) Weight:  [81.2 kg] 81.2 kg (08/10 1030)  Intake/Output from previous day: No intake/output data recorded. Intake/Output this shift: No intake/output data recorded.  Patient is arousable to voice Oriented to self (baseline) PERRLA MAE, Strength 5/5 BUE, BLE limited due to knee pain CN II-XII grossly intact  Lab Results: Recent Labs    08/21/18 1048 08/22/18 0333  WBC 2.6* 2.9*  HGB 12.3 11.6*  HCT 36.7 33.8*  PLT 177 179   BMET Recent Labs    08/21/18 1048 08/22/18 0333  NA 139 139  K 3.5 3.4*  CL 105 103  CO2 25 26  GLUCOSE 141* 104*  BUN 9 12  CREATININE 0.76 0.89  CALCIUM 9.4 9.3    Studies/Results: Ct Head Wo Contrast  Result Date: 08/22/2018 CLINICAL DATA:  85 year old female with subdural hemorrhage follow-up. EXAM: CT HEAD WITHOUT CONTRAST TECHNIQUE: Contiguous axial images were obtained from the base of the skull through the vertex without intravenous contrast. COMPARISON:  Head CT dated 08/21/2018 FINDINGS: Brain: Right hemispheric subdural hemorrhage measure up to 8 mm in thickness on the axial view and 11 mm on the coronal view over the right temporal lobe. There is layering of the subdural hemorrhage along the right side of the tentorium. There has been interval development of a left-sided subdural hemorrhage over the left temporal lobe measuring approximately 6 mm in thickness. There is mass effect and approximately 5 mm right to left midline shift. No transtentorial herniation. A small posterior pelvis seen  subdural hemorrhage is also noted. No intraventricular hemorrhage. There is moderate age-related atrophy and chronic microvascular ischemic changes. Vascular: No hyperdense vessel or unexpected calcification. Skull: Normal. Negative for fracture or focal lesion. Sinuses/Orbits: There is opacification of the right frontal sinus as well as opacification of multiple ethmoid air cells. No air-fluid level. The mastoid air cells are clear. Other: None IMPRESSION: 1. Right hemispheric subdural hemorrhage with no significant interval change in size since the prior CT and appears redistributed along the right tentorium and posterior falx. 2. Interval development of a left subdural hemorrhage over the temporal lobe. 3. Mass effect and approximately 5 mm left-to-right midline shift. 4. Continued close follow-up recommended. These results were called by telephone at the time of interpretation on 08/22/2018 at 12:50 am to nurse practitioner Hollace Hayward who verbally acknowledged these results. Electronically Signed   By: Anner Crete M.D.   On: 08/22/2018 00:59   Ct Head Wo Contrast  Result Date: 08/21/2018 CLINICAL DATA:  Head trauma, minor. Bruising to the right side. Possible fall. Headache and lethargy. EXAM: CT HEAD WITHOUT CONTRAST CT MAXILLOFACIAL WITHOUT CONTRAST TECHNIQUE: Multidetector CT imaging of the head and maxillofacial structures were performed using the standard protocol without intravenous contrast. Multiplanar CT image reconstructions of the maxillofacial structures were also generated. COMPARISON:  None. FINDINGS: CT HEAD FINDINGS Brain: Extensive hyperdense extra-axial hemorrhage is present over the right hemisphere. This measures up to 11 mm on coronal imaging. There is significant mass effect with effacement of the sulci and midline shift of 4 mm at the  foramen of Monro. There is partial effacement of the right lateral ventricle. No parenchymal or intraventricular hemorrhage is present. Acute  cortical infarct is present. Basal ganglia are intact. The brainstem and cerebellum are normal. Vascular: Minimal atherosclerotic changes are present within the cavernous internal carotid arteries bilaterally. There is no hyperdense vessel. Skull: Calvarium is intact. No focal lytic or blastic lesions are present. No significant extracranial soft tissue injury is present. CT MAXILLOFACIAL FINDINGS Osseous: No acute or healing fractures are present. The bones are intact. Mandible is intact and located. Zygomatic arch is within normal limits bilaterally. Orbits: Mild right periorbital soft tissue swelling is present. There is no underlying fracture. The globes are within normal limits. Orbits are unremarkable. Cavernous sinus is within normal limits bilaterally. Sellar and suprasellar regions are unremarkable. Sinuses: The right frontal sinus and anterior ethmoid air cells are opacified. There is no associated fracture. Minimal mucosal thickening is present in the more inferior left ethmoid air cells. The mastoid air cells are clear. Soft tissues: Mild right periorbital soft tissue swelling is present. This extends over the right cheek. There is no underlying fracture. IMPRESSION: 1. Large right extra-axial acute hemorrhage with mass effect and midline shift as described. Maximal thickness is 11 mm on coronal imaging with 4 mm of right-to-left midline shift. 2. No acute extracranial trauma. 3. Right periorbital soft tissue swelling without underlying fracture or globe injury. 4. Atherosclerosis. These results were called by telephone at the time of interpretation on 08/21/2018 at 1:10 pm to Dr. Lennice Sites , who verbally acknowledged these results. Electronically Signed   By: San Morelle M.D.   On: 08/21/2018 13:17   Ct Maxillofacial Wo Contrast  Result Date: 08/21/2018 CLINICAL DATA:  Head trauma, minor. Bruising to the right side. Possible fall. Headache and lethargy. EXAM: CT HEAD WITHOUT CONTRAST CT  MAXILLOFACIAL WITHOUT CONTRAST TECHNIQUE: Multidetector CT imaging of the head and maxillofacial structures were performed using the standard protocol without intravenous contrast. Multiplanar CT image reconstructions of the maxillofacial structures were also generated. COMPARISON:  None. FINDINGS: CT HEAD FINDINGS Brain: Extensive hyperdense extra-axial hemorrhage is present over the right hemisphere. This measures up to 11 mm on coronal imaging. There is significant mass effect with effacement of the sulci and midline shift of 4 mm at the foramen of Monro. There is partial effacement of the right lateral ventricle. No parenchymal or intraventricular hemorrhage is present. Acute cortical infarct is present. Basal ganglia are intact. The brainstem and cerebellum are normal. Vascular: Minimal atherosclerotic changes are present within the cavernous internal carotid arteries bilaterally. There is no hyperdense vessel. Skull: Calvarium is intact. No focal lytic or blastic lesions are present. No significant extracranial soft tissue injury is present. CT MAXILLOFACIAL FINDINGS Osseous: No acute or healing fractures are present. The bones are intact. Mandible is intact and located. Zygomatic arch is within normal limits bilaterally. Orbits: Mild right periorbital soft tissue swelling is present. There is no underlying fracture. The globes are within normal limits. Orbits are unremarkable. Cavernous sinus is within normal limits bilaterally. Sellar and suprasellar regions are unremarkable. Sinuses: The right frontal sinus and anterior ethmoid air cells are opacified. There is no associated fracture. Minimal mucosal thickening is present in the more inferior left ethmoid air cells. The mastoid air cells are clear. Soft tissues: Mild right periorbital soft tissue swelling is present. This extends over the right cheek. There is no underlying fracture. IMPRESSION: 1. Large right extra-axial acute hemorrhage with mass effect  and midline shift as  described. Maximal thickness is 11 mm on coronal imaging with 4 mm of right-to-left midline shift. 2. No acute extracranial trauma. 3. Right periorbital soft tissue swelling without underlying fracture or globe injury. 4. Atherosclerosis. These results were called by telephone at the time of interpretation on 08/21/2018 at 1:10 pm to Dr. Lennice Sites , who verbally acknowledged these results. Electronically Signed   By: San Morelle M.D.   On: 08/21/2018 13:17    Assessment/Plan: Patient was admitted 08/21/2018 due to a large right-sided subdural hematoma. Her scan completed early in the morning on 08/22/2018 did not demonstrate much change from original scan. Her neuro exam remains unchanged. We would likely try to avoid surgery as she's not a good surgical candidate due to age and history of dementia.   LOS: 0 days    -Continue to monitor neuro exam -Mobilize with therapies   Viona Gilmore, DNP, AGNP-C Nurse Practitioner  [facility]. [address], [address], [place], [place] [address] P: [phone]     F: [phone]  08/22/2018, 9:53 AM

## 2018-08-22 NOTE — Progress Notes (Signed)
Dr. Lennox Grumbles informed of [facility] wanting a call at [phone] Dr. Olean Ree

## 2018-08-22 NOTE — Evaluation (Signed)
Physical Therapy Evaluation Patient Details Name: Jocelyn Mccann MRN: [id number] DOB: Sep 08, 1928 Today's Date: 08/22/2018   History of Present Illness  Jocelyn Mccann is a 85 y.o. female with medical history significant of dementia and recurrent breast cancer (2019, declined radiation, on anastrozole with some ?of compliance) presenting with headache.  She was lethargic in the bathroom.  She went into the bathroom and was out of it on the commode.  She complained of her head hurting.  She did not fall, according to her daughter.  The patient cannot remember what happened but reports pain along her R under eye region and through her frontal head.    Clinical Impression  Pt had hard time transferring with +2 nursing assist.  She did better for me with walking in the room with RW.  I spent a long time talking with daughter princella. She said pt was independent with her ADLs and walked with cane prior to admit but resisted showering.  At this point im unsure if pts mobilty issues are more from different environment or SDH.  I encouraged daughter to come in tomorrow for PT session and see if pt will do better for daughter - to see if daughter can care for her as she is.  Daughter doesn't want SNF (as wont be abel to visit with COVID) but agrees she may not be able to care for her.  Pts left knee is also very sore limiting her mobiltiy - daughter said pt supposed to have cortisone shot in knee but cancelled due to COVID.  CAN PT GET SHOT IN LEFT KNEE TO HELP WITH PAIN AND HELP TO IMPROVE HER MOBILITY.    Follow Up Recommendations SNF;Supervision/Assistance - 24 hour    Equipment Recommendations  None recommended by PT    Recommendations for Other Services       Precautions / Restrictions Precautions Precautions: Fall Precaution Comments: daughter was not aware of fall.  daughter said pt shuts her door at night and wants to be left alone      Mobility  Bed Mobility Overal bed mobility: Needs  Assistance Bed Mobility: Sit to Supine     Supine to sit: Mod assist Sit to supine: Min assist   General bed mobility comments: pt abel to get back into bed and get feet up - needed min assist for positioning  Transfers Overall transfer level: Needs assistance Equipment used: Rolling walker (2 wheeled) Transfers: Sit to/from [facility] Sit to Stand: Max assist;+2 safety/equipment         General transfer comment: pt was getting to [facility] with nursing +2 assist wehn i arrived.  pt unable to coordinate her movements - saying - just move me to right place.  after urinating - pt sit to stand with min assist and max cues to stand with RW - nursing cleaned pt  Ambulation/Gait Ambulation/Gait assistance: Min assist Gait Distance (Feet): 25 Feet Assistive device: Rolling walker (2 wheeled) Gait Pattern/deviations: Trunk flexed;Shuffle;Decreased step length - left;Decreased stance time - left;Wide base of support     General Gait Details: pt walked with RW - did better than i thought she would.  pt needed cues to stay up closer to RW - tended to lean and then not as safe.  pt used RW to help keep some weight off her sore knee. she was abel to turn and back up to sit with mod to min assist and cues  Stairs  Wheelchair Mobility    Modified Rankin (Stroke Patients Only)       Balance Overall balance assessment: Needs assistance Sitting-balance support: Bilateral upper extremity supported;Feet supported Sitting balance-Leahy Scale: Fair     Standing balance support: Bilateral upper extremity supported;During functional activity Standing balance-Leahy Scale: Zero Standing balance comment: pt did better standing with RW - needed min assist                             Pertinent Vitals/Pain Faces Pain Scale: Hurts even more Pain Location: r knee Pain Descriptors / Indicators: Aching;Sore;Guarding Pain Intervention(s): Monitored during  session    Home Living Family/patient expects to be discharged to:: Private residence Living Arrangements: Children Available Help at Discharge: Family             Additional Comments: daughter reports that pt walked at home with cane.  daughter would set out pts clothes and pt could dress herself etc.  pt not wanting to shower lately - ever since last surgery    Prior Function                 Hand Dominance        Extremity/Trunk Assessment   Upper Extremity Assessment Upper Extremity Assessment: Generalized weakness    Lower Extremity Assessment Lower Extremity Assessment: LLE deficits/detail LLE Deficits / Details: pt sitting - did long arc quad with left leg and 40 degrees from full extension.  also 40 degrees PROM from full extension - positioned ehr with pillow for increased comfort.  Daughter said pt was supposed to have shot in her knee but not done due to covid -she is wondering if she can have it here    Cervical / Trunk Assessment Cervical / Trunk Assessment: Kyphotic  Communication   Communication: No difficulties  Cognition Arousal/Alertness: Awake/alert Behavior During Therapy: Impulsive;Anxious Overall Cognitive Status: No family/caregiver present to determine baseline cognitive functioning                                 General Comments: daughter said pt has mild dementia and could do for herself.  unsure if pt not doing well here due to different enviroment/people or from SDH.      General Comments General comments (skin integrity, edema, etc.): pt impulsive and inconsistent. she let go of RW and grabbed for bed while walking.  had to have her reach back for walker for safety    Exercises Total Joint Exercises Long Arc Quad: AROM;10 reps;Both;Seated   Assessment/Plan    PT Assessment Patient needs continued PT services  PT Problem List Decreased mobility;Decreased safety awareness;Decreased knowledge of precautions;Decreased  activity tolerance;Decreased knowledge of use of DME;Pain       PT Treatment Interventions Therapeutic activities;Gait training;Therapeutic exercise;Patient/family education;Balance training;Functional mobility training    PT Goals (Current goals can be found in the Care Plan section)  Acute Rehab PT Goals Patient Stated Goal: to get back to the way i was PT Goal Formulation: With patient/family Time For Goal Achievement: 09/05/18 Potential to Achieve Goals: Fair    Frequency Min 3X/week   Barriers to discharge   pt lives with daughter. she is there most of the time but has several children and has to occasionally assist them as needed.  daughter feeling a bit overwhelmed by it all    Co-evaluation  AM-PAC PT "6 Clicks" Mobility  Outcome Measure Help needed turning from your back to your side while in a flat bed without using bedrails?: A Little Help needed moving from lying on your back to sitting on the side of a flat bed without using bedrails?: A Little Help needed moving to and from a bed to a chair (including a wheelchair)?: A Lot Help needed standing up from a chair using your arms (e.g., wheelchair or bedside chair)?: A Lot Help needed to walk in hospital room?: A Lot Help needed climbing 3-5 steps with a railing? : Total 6 Click Score: 13    End of Session Equipment Utilized During Treatment: Gait belt Activity Tolerance: Patient limited by pain Patient left: in bed;with call bell/phone within reach;with bed alarm set;with nursing/sitter in room Nurse Communication: Mobility status PT Visit Diagnosis: Unsteadiness on feet (R26.81);Difficulty in walking, not elsewhere classified (R26.2);Pain Pain - Right/Left: Left Pain - part of body: Knee    Time: 1443-1540 PT Time Calculation (min) (ACUTE ONLY): 34 min   Charges:   PT Evaluation $PT Eval Low Complexity: 1 Low PT Treatments $Gait Training: 8-22 mins $Therapeutic Activity: 8-22 mins         08/22/2018   Rande Lawman, PT   Loyal Buba 08/22/2018, 3:39 PM

## 2018-08-22 NOTE — Evaluation (Signed)
Occupational Therapy Evaluation Patient Details Name: Jocelyn Mccann MRN: [id number] DOB: Mar 06, 1928 Today's Date: 08/22/2018    History of Present Illness Jocelyn Mccann is a 85 y.o. female with medical history significant of dementia and recurrent breast cancer (2019, declined radiation, on anastrozole with some ?of compliance) presenting with headache.  She was lethargic in the bathroom.  She went into the bathroom and was out of it on the commode.  She complained of her head hurting.  She did not fall, according to her daughter.  The patient cannot remember what happened but reports pain along her R under eye region and through her frontal head.   Clinical Impression   Pt admitted with above diagnoses, with generalized weakness, pain and cognitive deficits at baseline limiting ability to engage in BADL at desired level of ind. PMH is uncertain due to pt being poor historian, but pt lives with dtr per chart review. Unsure of how much assist was needed at baseline. At time of evaluation, pt was mod A for bed mobility and max A +2 for safety for OOB transfers. Pt overall showing deficits in sequencing, which impairs her ability to safely complete transfers. She needs step by step tactile cueing for which extremities to move and when, as well as what direction. She had difficulty discriminating R and L as well. Max A +2 was needed for safe BSC t/f. At this time recommend pt receive SNF level of therapies at d/c to facilitate safe reintegration into BADL activity. Will continue to follow per POC listed below.    Follow Up Recommendations  SNF;Supervision/Assistance - 24 hour    Equipment Recommendations  Other (comment)(defer to next venue)    Recommendations for Other Services       Precautions / Restrictions Precautions Precautions: Fall Restrictions Weight Bearing Restrictions: No      Mobility Bed Mobility Overal bed mobility: Needs Assistance Bed Mobility: Supine to Sit;Sit to  Supine     Supine to sit: Mod assist Sit to supine: Mod assist   General bed mobility comments: mod A with increased time for cueing for sequencing for BLE translation to EOB. Pt needing tactile cues to best understand how to sequence limbs  Transfers Overall transfer level: Needs assistance Equipment used: Rolling walker (2 wheeled);None Transfers: Sit to/from Stand Sit to Stand: Max assist;+2 safety/equipment         General transfer comment: max A +2 for safety, pt reaching for far away surfaces and not understanding use of RW at this time; fearful of falling and not contributing to effort and stating "just move me"    Balance Overall balance assessment: Needs assistance Sitting-balance support: Bilateral upper extremity supported;Feet supported Sitting balance-Leahy Scale: Fair     Standing balance support: Bilateral upper extremity supported;During functional activity Standing balance-Leahy Scale: Zero Standing balance comment: max A +2 to transfer to [facility], reaching for other surfaces                           ADL either performed or assessed with clinical judgement   ADL Overall ADL's : Needs assistance/impaired Eating/Feeding: Minimal assistance;Cueing for sequencing;Sitting   Grooming: Minimal assistance;Cueing for sequencing;Sitting   Upper Body Bathing: Moderate assistance;Cueing for sequencing;Sitting   Lower Body Bathing: Moderate assistance;Sit to/from stand;Sitting/lateral leans;Cueing for sequencing   Upper Body Dressing : Moderate assistance;Sitting;Cueing for sequencing   Lower Body Dressing: Maximal assistance;Sit to/from stand;Sitting/lateral leans;Cueing for sequencing   Toilet Transfer: Maximal assistance;+2 for  safety/equipment;Cueing for sequencing;BSC;Stand-pivot Toilet Transfer Details (indicate cue type and reason): pt with difficulty sequencing extremities and R/L Toileting- Clothing Manipulation and Hygiene: Maximal assistance;Sit  to/from stand;+2 for safety/equipment Toileting - Clothing Manipulation Details (indicate cue type and reason): max A support to stand and complete peri care, pt attempting but again having issues sequencing stating "start from front? or back? wipe in there?" Tub/ Shower Transfer: Maximal assistance;+2 for safety/equipment;Shower seat   Functional mobility during ADLs: Maximal assistance;+2 for safety/equipment;Cueing for sequencing General ADL Comments: overall limited due to sequencing deficits and complications     Vision Patient Visual Report: Blurring of vision Vision Assessment?: Vision impaired- to be further tested in functional context Additional Comments: difficulty seeing board, notes usually wears glasses but having difficulty describing vision. reports eye pain     Perception     Praxis      Pertinent Vitals/Pain Pain Assessment: Faces Faces Pain Scale: Hurts even more Pain Location: R knee Pain Descriptors / Indicators: Aching;Sore Pain Intervention(s): Limited activity within patient's tolerance;Monitored during session;Repositioned     Hand Dominance     Extremity/Trunk Assessment Upper Extremity Assessment Upper Extremity Assessment: Generalized weakness   Lower Extremity Assessment Lower Extremity Assessment: Defer to PT evaluation       Communication Communication Communication: No difficulties   Cognition Arousal/Alertness: Awake/alert Behavior During Therapy: WFL for tasks assessed/performed Overall Cognitive Status: History of cognitive impairments - at baseline Area of Impairment: Orientation;Following commands;Problem solving;Safety/judgement;Memory;Awareness                 Orientation Level: Disoriented to;Place;Time;Situation   Memory: Decreased short-term memory Following Commands: Follows one step commands inconsistently;Follows one step commands with increased time Safety/Judgement: Decreased awareness of deficits Awareness:  Intellectual Problem Solving: Difficulty sequencing;Requires verbal cues;Requires tactile cues;Slow processing General Comments: pt presented with difficulty sequencing extremities in t/f, needing tactile and verbal cues step by step to complete task   General Comments       Exercises     Shoulder Instructions      Home Living Family/patient expects to be discharged to:: Private residence Living Arrangements: Children Available Help at Discharge: Family(dtr)                             Additional Comments: unsure of further home set up due to pt being poor historian      Prior Functioning/Environment          Comments: unsure of PLOF due to pt  being poor historian        OT Problem List: Decreased strength;Decreased knowledge of use of DME or AE;Decreased coordination;Decreased knowledge of precautions;Decreased activity tolerance;Decreased cognition;Impaired balance (sitting and/or standing);Decreased safety awareness;Pain      OT Treatment/Interventions: Self-care/ADL training;Therapeutic exercise;Patient/family education;Neuromuscular education;Balance training;Energy conservation;Therapeutic activities;DME and/or AE instruction;Cognitive remediation/compensation    OT Goals(Current goals can be found in the care plan section) Acute Rehab OT Goals Patient Stated Goal: to limit eye pain OT Goal Formulation: With patient Time For Goal Achievement: 09/05/18 Potential to Achieve Goals: Good  OT Frequency: Min 2X/week   Barriers to D/C:            Co-evaluation              AM-PAC OT "6 Clicks" Daily Activity     Outcome Measure Help from another person eating meals?: A Little Help from another person taking care of personal grooming?: A Little Help from another person toileting, which includes  using toliet, bedpan, or urinal?: A Lot Help from another person bathing (including washing, rinsing, drying)?: Total Help from another person to put on  and taking off regular upper body clothing?: A Lot Help from another person to put on and taking off regular lower body clothing?: Total 6 Click Score: 12   End of Session Equipment Utilized During Treatment: Gait belt;Rolling walker Nurse Communication: Mobility status  Activity Tolerance: Patient tolerated treatment well Patient left: in bed;with call bell/phone within reach;with bed alarm set;with nursing/sitter in room  OT Visit Diagnosis: Unsteadiness on feet (R26.81);Other abnormalities of gait and mobility (R26.89);Muscle weakness (generalized) (M62.81);Other symptoms and signs involving cognitive function;Pain Pain - Right/Left: Right Pain - part of body: Knee                Time: 5868-2574 OT Time Calculation (min): 27 min Charges:  OT General Charges $OT Visit: 1 Visit OT Evaluation $OT Eval Moderate Complexity: 1 Mod OT Treatments $Self Care/Home Management : 8-22 mins  Zenovia Jarred, MSOT, OTR/L Behavioral Health OT/ Acute Relief OT [facility] Office: [place] 08/22/2018, 11:54 AM

## 2018-08-22 NOTE — Plan of Care (Signed)
Pt admitted with (R)SDH with 45mm R to L MLS.. Repeat CT head scan this morning showed new (L)SDH and increase in MLS to 61mm. Went to bedside to assess pt. Pt A&O to self only. PERRL, Blink to threat bilaterally. 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 No facial droop. Non-co-operative with exams. BUE Antigravity. BLE withdraws to painful stimulation. Neurosurgery notified of CT results.

## 2018-08-23 MED ORDER — POTASSIUM CHLORIDE CRYS ER 20 MEQ PO TBCR
40.0000 meq | EXTENDED_RELEASE_TABLET | Freq: Once | ORAL | Status: AC
  Administered 2018-08-23: 40 meq via ORAL
  Filled 2018-08-23: qty 2

## 2018-08-23 NOTE — Progress Notes (Signed)
PROGRESS NOTE    Jocelyn Mccann  WJX:[id number] DOB: Oct 01, 1928 DOA: 08/21/2018 PCP: Orlena Sheldon, PA-C   Brief Narrative:  Patient is 85 year old female with history of dementia, breast cancer currently on anastrozole who presented from home to the emergency department with a fall and headache.  She was complaining of headache and left knee pain on presentation.  She was hemodynamically stable on presentation.  Brain imaging showed 11 mm subdural hematoma on the right side with no acute neurological deficits.  Neurosurgery consulted after admission.  Plan is to repeat CT head tomorrow.  PT/OT recommending skilled nursing facility placement.  Assessment & Plan:   Active Problems:   SDH (subdural hematoma) (HCC)  Bilateral subdural hematoma: Right more than left with minimal midline shift.  No neurological deficits.  Neurosurgery following.  No need of acute surgical intervention.  Plan is to repeat CT head tomorrow. Continue frequent neurological monitoring  Fall: Patient followed by PT/OT.  Recommended skilled nursing facility.  Family willing to take her home home.  Advanced dementia: Lives with family at home.  Continue fall precautions.  Continue supportive care  Hypokalemia: Supplemented and corrected.  History of breast cancer: Currently on remission.  On anastrozole.         DVT prophylaxis: SCD Code Status: Full Family Communication: Will discuss with daughter after getting the report of CT head tomorrow Disposition Plan: SNF vs Home with home health   Consultants: Neurosurgery  Procedures: None  Antimicrobials:  Anti-infectives (From admission, onward)   None      Subjective:  Seen and examined at bedside this morning.  Hemodynamically stable.  Looks comfortable.  She is confused and oriented to self only.  Sitter at the bedside.  Not agitated.  Denies any complaints  Objective: Vitals:   08/23/18 0351 08/23/18 0559 08/23/18 0716 08/23/18 1131  BP: (!)  144/76 (!) 145/76 (!) 148/75 115/64  Pulse: 82 72 75 66  Resp: 14 14 14 15   Temp: 98.1 F (36.7 C) 98.7 F (37.1 C) 97.7 F (36.5 C) 97.7 F (36.5 C)  TempSrc: Oral Oral Oral Oral  SpO2: 100% 100% 100% 99%  Weight:      Height:        Intake/Output Summary (Last 24 hours) at 08/23/2018 1305 Last data filed at 08/23/2018 0830 Gross per 24 hour  Intake 116 ml  Output --  Net 116 ml   Filed Weights   08/21/18 1030  Weight: 81.2 kg    Examination:  General exam: Elderly debilitated female  HEENT:PERRL,Oral mucosa moist, Ear/Nose normal on gross exam Respiratory system: Bilateral equal air entry, normal vesicular breath sounds, no wheezes or crackles  Cardiovascular system: S1 & S2 heard, RRR. No JVD, murmurs, rubs, gallops or clicks. No pedal edema. Gastrointestinal system: Abdomen is nondistended, soft and nontender. No organomegaly or masses felt. Normal bowel sounds heard. Central nervous system: Alert and awake.  Oriented to self only extremities: No edema, no clubbing ,no cyanosis, distal peripheral pulses palpable. Skin: No rashes, lesions or ulcers,no icterus ,no pallor MSK: Normal muscle bulk,tone ,power Psychiatry: Judgement and insight appear impaired   Data Reviewed: I have personally reviewed following labs and imaging studies  CBC: Recent Labs  Lab 08/21/18 1048 08/22/18 0333  WBC 2.6* 2.9*  NEUTROABS 1.6*  --   HGB 12.3 11.6*  HCT 36.7 33.8*  MCV 103.7* 102.1*  PLT 177 213   Basic Metabolic Panel: Recent Labs  Lab 08/21/18 1048 08/22/18 0333  NA 139 139  K 3.5 3.4*  CL 105 103  CO2 25 26  GLUCOSE 141* 104*  BUN 9 12  CREATININE 0.76 0.89  CALCIUM 9.4 9.3   GFR: Estimated Creatinine Clearance: 47.9 mL/min (by C-G formula based on SCr of 0.89 mg/dL). Liver Function Tests: No results for input(s): AST, ALT, ALKPHOS, BILITOT, PROT, ALBUMIN in the last 168 hours. No results for input(s): LIPASE, AMYLASE in the last 168 hours. No results for  input(s): AMMONIA in the last 168 hours. Coagulation Profile: Recent Labs  Lab 08/21/18 1048  INR 1.1   Cardiac Enzymes: No results for input(s): CKTOTAL, CKMB, CKMBINDEX, TROPONINI in the last 168 hours. BNP (last 3 results) No results for input(s): PROBNP in the last 8760 hours. HbA1C: No results for input(s): HGBA1C in the last 72 hours. CBG: Recent Labs  Lab 08/21/18 1033  GLUCAP 124*   Lipid Profile: No results for input(s): CHOL, HDL, LDLCALC, TRIG, CHOLHDL, LDLDIRECT in the last 72 hours. Thyroid Function Tests: No results for input(s): TSH, T4TOTAL, FREET4, T3FREE, THYROIDAB in the last 72 hours. Anemia Panel: No results for input(s): VITAMINB12, FOLATE, FERRITIN, TIBC, IRON, RETICCTPCT in the last 72 hours. Sepsis Labs: No results for input(s): PROCALCITON, LATICACIDVEN in the last 168 hours.  Recent Results (from the past 240 hour(s))  SARS Coronavirus 2 [facility] order, Performed in [facility] hospital lab) Nasopharyngeal Nasopharyngeal Swab     Status: None   Collection Time: 08/21/18  1:47 PM   Specimen: Nasopharyngeal Swab  Result Value Ref Range Status   SARS Coronavirus 2 NEGATIVE NEGATIVE Final    Comment: (NOTE) If result is NEGATIVE SARS-CoV-2 target nucleic acids are NOT DETECTED. The SARS-CoV-2 RNA is generally detectable in upper and lower  respiratory specimens during the acute phase of infection. The lowest  concentration of SARS-CoV-2 viral copies this assay can detect is 250  copies / mL. A negative result does not preclude SARS-CoV-2 infection  and should not be used as the sole basis for treatment or other  patient management decisions.  A negative result may occur with  improper specimen collection / handling, submission of specimen other  than nasopharyngeal swab, presence of viral mutation(s) within the  areas targeted by this assay, and inadequate number of viral copies  (<250 copies / mL). A negative result must be combined with clinical   observations, patient history, and epidemiological information. If result is POSITIVE SARS-CoV-2 target nucleic acids are DETECTED. The SARS-CoV-2 RNA is generally detectable in upper and lower  respiratory specimens dur ing the acute phase of infection.  Positive  results are indicative of active infection with SARS-CoV-2.  Clinical  correlation with patient history and other diagnostic information is  necessary to determine patient infection status.  Positive results do  not rule out bacterial infection or co-infection with other viruses. If result is PRESUMPTIVE POSTIVE SARS-CoV-2 nucleic acids MAY BE PRESENT.   A presumptive positive result was obtained on the submitted specimen  and confirmed on repeat testing.  While 2019 novel coronavirus  (SARS-CoV-2) nucleic acids may be present in the submitted sample  additional confirmatory testing may be necessary for epidemiological  and / or clinical management purposes  to differentiate between  SARS-CoV-2 and other Sarbecovirus currently known to infect humans.  If clinically indicated additional testing with an alternate test  methodology [phone]) is advised. The SARS-CoV-2 RNA is generally  detectable in upper and lower respiratory sp ecimens during the acute  phase of infection. The expected result is Negative. Fact  Sheet for Patients:  StrictlyIdeas.no Fact Sheet for Healthcare Providers: BankingDealers.co.za This test is not yet approved or cleared by the [place] FDA and has been authorized for detection and/or diagnosis of SARS-CoV-2 by FDA under an Emergency Use Authorization (EUA).  This EUA will remain in effect (meaning this test can be used) for the duration of the COVID-19 declaration under Section 564(b)(1) of the Act, 21 U.S.C. section 360bbb-3(b)(1), unless the authorization is terminated or revoked sooner. Performed at [facility], [place] [address],  [place], [place] [address]          Radiology Studies: Dg Knee 1-2 Views Left  Result Date: 08/22/2018 CLINICAL DATA:  Medial LEFT knee pain, possible fall. EXAM: LEFT KNEE - 1-2 VIEW COMPARISON:  None. FINDINGS: Advanced degenerative change at the lateral compartment with abutment of the lateral femoral condyle and lateral tibial plateau and with associated articular surface sclerosis and osteophyte formation. Medial compartment is relatively well preserved. No acute appearing osseous abnormality. No fracture line or displaced fracture fragment seen. Probable small joint effusion within the suprapatellar bursa. Soft tissues about the LEFT knee are unremarkable. IMPRESSION: 1. Advanced degenerative change at the lateral compartment, as detailed above. 2. No acute appearing osseous abnormality. No fracture or acute dislocation. 3. Probable small joint effusion. Electronically Signed   By: Franki Cabot M.D.   On: 08/22/2018 10:46   Ct Head Wo Contrast  Result Date: 08/22/2018 CLINICAL DATA:  85 year old female with subdural hemorrhage follow-up. EXAM: CT HEAD WITHOUT CONTRAST TECHNIQUE: Contiguous axial images were obtained from the base of the skull through the vertex without intravenous contrast. COMPARISON:  Head CT dated 08/21/2018 FINDINGS: Brain: Right hemispheric subdural hemorrhage measure up to 8 mm in thickness on the axial view and 11 mm on the coronal view over the right temporal lobe. There is layering of the subdural hemorrhage along the right side of the tentorium. There has been interval development of a left-sided subdural hemorrhage over the left temporal lobe measuring approximately 6 mm in thickness. There is mass effect and approximately 5 mm right to left midline shift. No transtentorial herniation. A small posterior pelvis seen subdural hemorrhage is also noted. No intraventricular hemorrhage. There is moderate age-related atrophy and chronic microvascular ischemic changes. Vascular:  No hyperdense vessel or unexpected calcification. Skull: Normal. Negative for fracture or focal lesion. Sinuses/Orbits: There is opacification of the right frontal sinus as well as opacification of multiple ethmoid air cells. No air-fluid level. The mastoid air cells are clear. Other: None IMPRESSION: 1. Right hemispheric subdural hemorrhage with no significant interval change in size since the prior CT and appears redistributed along the right tentorium and posterior falx. 2. Interval development of a left subdural hemorrhage over the temporal lobe. 3. Mass effect and approximately 5 mm left-to-right midline shift. 4. Continued close follow-up recommended. These results were called by telephone at the time of interpretation on 08/22/2018 at 12:50 am to nurse practitioner Hollace Hayward who verbally acknowledged these results. Electronically Signed   By: Anner Crete M.D.   On: 08/22/2018 00:59        Scheduled Meds:  anastrozole  1 mg Oral Daily   docusate sodium  100 mg Oral BID   sodium chloride flush  3 mL Intravenous Q12H   Continuous Infusions:   LOS: 1 day    Time spent: 25 min. More than 50% of that time was spent in counseling and/or coordination of care.      Shelly Coss, MD  [facility] Pager [phone]  If 7PM-7AM, please contact night-coverage [email or web address] Password [email or web address] 08/23/2018, 1:05 PM

## 2018-08-23 NOTE — Progress Notes (Signed)
Physical Therapy Treatment Patient Details Name: Jocelyn Mccann MRN: [id number] DOB: [birth year] Today's Date: 08/23/2018    History of Present Illness EDLYN Mccann is a 85 y.o. female with medical history significant of dementia and recurrent breast cancer (2019, declined radiation, on anastrozole with some ?of compliance) presenting with headache.  She was lethargic in the bathroom.  She went into the bathroom and was out of it on the commode.  She complained of her head hurting.  She did not fall, according to her daughter.  The patient cannot remember what happened but reports pain along her R under eye region and through her frontal head.    PT Comments    Patient progressing slowly and remains limited with transfers and gait continued high fall risk if at home alone.  Spoke with daughter who reports she was completely independent prior to this episode.  Agrees she would not be safe at home and agreeable to SNF for rehab.  PT to follow.    Follow Up Recommendations  SNF;Supervision/Assistance - 24 hour     Equipment Recommendations  None recommended by PT    Recommendations for Other Services       Precautions / Restrictions Precautions Precautions: Fall    Mobility  Bed Mobility           Sit to supine: Supervision   General bed mobility comments: up getting to [facility] with NT upon my entry, to supine able to lift legs and scoot herself up in bed  Transfers Overall transfer level: Needs assistance Equipment used: None;Rolling walker (2 wheeled) Transfers: Sit to/from [facility] Sit to Stand: Mod assist Stand pivot transfers: Mod assist;+2 safety/equipment       General transfer comment: up from bed to [facility] no device, pt fearful and needing assist with limited ability to get feet under her, from [facility] to RW shoes on more confident and able to get up though still difficult to transition from amrests to RW as feet too far  forward  Ambulation/Gait Ambulation/Gait assistance: Min assist Gait Distance (Feet): 20 Feet Assistive device: Rolling walker (2 wheeled) Gait Pattern/deviations: Step-to pattern;Shuffle;Decreased stride length;Trunk flexed     General Gait Details: to door and back to bed, limited by knee pain   Stairs             Wheelchair Mobility    Modified Rankin (Stroke Patients Only)       Balance Overall balance assessment: Needs assistance;Mild deficits observed, not formally tested   Sitting balance-Leahy Scale: Fair       Standing balance-Leahy Scale: Poor Standing balance comment: UE support on walker                            Cognition Arousal/Alertness: Awake/alert Behavior During Therapy: WFL for tasks assessed/performed Overall Cognitive Status: No family/caregiver present to determine baseline cognitive functioning Area of Impairment: Attention;Memory;Safety/judgement                   Current Attention Level: Sustained Memory: Decreased short-term memory Following Commands: Follows one step commands consistently Safety/Judgement: Decreased awareness of safety;Decreased awareness of deficits   Problem Solving: Slow processing General Comments: Spoke with daughter by phone to update on patient's assist level this session, reports pt would need to be able to be left alone to return home, agrees she may needs SNF level rehab due to fall risk at this point.      Exercises  General Comments General comments (skin integrity, edema, etc.): fearful of falling, cues to use walker and not bedrail turning to walk toward door      Pertinent Vitals/Pain Pain Assessment: Faces Faces Pain Scale: Hurts even more Pain Location: r knee with ambulation Pain Descriptors / Indicators: Aching;Sore;Guarding Pain Intervention(s): Monitored during session;Repositioned    Home Living                      Prior Function            PT  Goals (current goals can now be found in the care plan section) Progress towards PT goals: Progressing toward goals    Frequency           PT Plan Current plan remains appropriate    Co-evaluation              AM-PAC PT "6 Clicks" Mobility   Outcome Measure  Help needed turning from your back to your side while in a flat bed without using bedrails?: A Little Help needed moving from lying on your back to sitting on the side of a flat bed without using bedrails?: A Little Help needed moving to and from a bed to a chair (including a wheelchair)?: A Lot Help needed standing up from a chair using your arms (e.g., wheelchair or bedside chair)?: A Lot Help needed to walk in hospital room?: A Lot Help needed climbing 3-5 steps with a railing? : Total 6 Click Score: 13    End of Session Equipment Utilized During Treatment: Gait belt Activity Tolerance: Patient tolerated treatment well Patient left: in bed;with call bell/phone within reach;with nursing/sitter in room   PT Visit Diagnosis: Other abnormalities of gait and mobility (R26.89);Other symptoms and signs involving the nervous system (R29.898) Pain - Right/Left: Left Pain - part of body: Knee     Time: 1310-1330 PT Time Calculation (min) (ACUTE ONLY): 20 min  Charges:  $Gait Training: 8-22 mins                     Magda Kiel, [place] [phone] 08/23/2018    Reginia Naas 08/23/2018, 4:49 PM

## 2018-08-23 NOTE — Progress Notes (Signed)
Subjective: The patient is alert and pleasant.  She has a Actuary at the bedside.  She has no complaints.  Objective: Vital signs in last 24 hours: Temp:  [97.7 F (36.5 C)-98.7 F (37.1 C)] 97.7 F (36.5 C) (08/12 0716) Pulse Rate:  [66-89] 75 (08/12 0716) Resp:  [14-18] 14 (08/12 0716) BP: (120-178)/(69-104) 148/75 (08/12 0716) SpO2:  [98 %-100 %] 100 % (08/12 0716) Estimated body mass index is 28.04 kg/m as calculated from the following:   Height as of this encounter: 5\' 7"  (1.702 m).   Weight as of this encounter: 81.2 kg.   Intake/Output from previous day: 08/11 0701 - 08/12 0700 In: 462 [P.O.:462] Out: -  Intake/Output this shift: No intake/output data recorded.  Physical exam the patient is alert and pleasant.  She is oriented x1, person.  She follows commands.  There is no weakness.  Lab Results: Recent Labs    08/21/18 1048 08/22/18 0333  WBC 2.6* 2.9*  HGB 12.3 11.6*  HCT 36.7 33.8*  PLT 177 179   BMET Recent Labs    08/21/18 1048 08/22/18 0333  NA 139 139  K 3.5 3.4*  CL 105 103  CO2 25 26  GLUCOSE 141* 104*  BUN 9 12  CREATININE 0.76 0.89  CALCIUM 9.4 9.3    Studies/Results: Dg Knee 1-2 Views Left  Result Date: 08/22/2018 CLINICAL DATA:  Medial LEFT knee pain, possible fall. EXAM: LEFT KNEE - 1-2 VIEW COMPARISON:  None. FINDINGS: Advanced degenerative change at the lateral compartment with abutment of the lateral femoral condyle and lateral tibial plateau and with associated articular surface sclerosis and osteophyte formation. Medial compartment is relatively well preserved. No acute appearing osseous abnormality. No fracture line or displaced fracture fragment seen. Probable small joint effusion within the suprapatellar bursa. Soft tissues about the LEFT knee are unremarkable. IMPRESSION: 1. Advanced degenerative change at the lateral compartment, as detailed above. 2. No acute appearing osseous abnormality. No fracture or acute dislocation. 3.  Probable small joint effusion. Electronically Signed   By: Franki Cabot M.D.   On: 08/22/2018 10:46   Ct Head Wo Contrast  Result Date: 08/22/2018 CLINICAL DATA:  85 year old female with subdural hemorrhage follow-up. EXAM: CT HEAD WITHOUT CONTRAST TECHNIQUE: Contiguous axial images were obtained from the base of the skull through the vertex without intravenous contrast. COMPARISON:  Head CT dated 08/21/2018 FINDINGS: Brain: Right hemispheric subdural hemorrhage measure up to 8 mm in thickness on the axial view and 11 mm on the coronal view over the right temporal lobe. There is layering of the subdural hemorrhage along the right side of the tentorium. There has been interval development of a left-sided subdural hemorrhage over the left temporal lobe measuring approximately 6 mm in thickness. There is mass effect and approximately 5 mm right to left midline shift. No transtentorial herniation. A small posterior pelvis seen subdural hemorrhage is also noted. No intraventricular hemorrhage. There is moderate age-related atrophy and chronic microvascular ischemic changes. Vascular: No hyperdense vessel or unexpected calcification. Skull: Normal. Negative for fracture or focal lesion. Sinuses/Orbits: There is opacification of the right frontal sinus as well as opacification of multiple ethmoid air cells. No air-fluid level. The mastoid air cells are clear. Other: None IMPRESSION: 1. Right hemispheric subdural hemorrhage with no significant interval change in size since the prior CT and appears redistributed along the right tentorium and posterior falx. 2. Interval development of a left subdural hemorrhage over the temporal lobe. 3. Mass effect and approximately 5 mm  left-to-right midline shift. 4. Continued close follow-up recommended. These results were called by telephone at the time of interpretation on 08/22/2018 at 12:50 am to nurse practitioner Hollace Hayward who verbally acknowledged these results.  Electronically Signed   By: Anner Crete M.D.   On: 08/22/2018 00:59   Ct Head Wo Contrast  Result Date: 08/21/2018 CLINICAL DATA:  Head trauma, minor. Bruising to the right side. Possible fall. Headache and lethargy. EXAM: CT HEAD WITHOUT CONTRAST CT MAXILLOFACIAL WITHOUT CONTRAST TECHNIQUE: Multidetector CT imaging of the head and maxillofacial structures were performed using the standard protocol without intravenous contrast. Multiplanar CT image reconstructions of the maxillofacial structures were also generated. COMPARISON:  None. FINDINGS: CT HEAD FINDINGS Brain: Extensive hyperdense extra-axial hemorrhage is present over the right hemisphere. This measures up to 11 mm on coronal imaging. There is significant mass effect with effacement of the sulci and midline shift of 4 mm at the foramen of Monro. There is partial effacement of the right lateral ventricle. No parenchymal or intraventricular hemorrhage is present. Acute cortical infarct is present. Basal ganglia are intact. The brainstem and cerebellum are normal. Vascular: Minimal atherosclerotic changes are present within the cavernous internal carotid arteries bilaterally. There is no hyperdense vessel. Skull: Calvarium is intact. No focal lytic or blastic lesions are present. No significant extracranial soft tissue injury is present. CT MAXILLOFACIAL FINDINGS Osseous: No acute or healing fractures are present. The bones are intact. Mandible is intact and located. Zygomatic arch is within normal limits bilaterally. Orbits: Mild right periorbital soft tissue swelling is present. There is no underlying fracture. The globes are within normal limits. Orbits are unremarkable. Cavernous sinus is within normal limits bilaterally. Sellar and suprasellar regions are unremarkable. Sinuses: The right frontal sinus and anterior ethmoid air cells are opacified. There is no associated fracture. Minimal mucosal thickening is present in the more inferior left  ethmoid air cells. The mastoid air cells are clear. Soft tissues: Mild right periorbital soft tissue swelling is present. This extends over the right cheek. There is no underlying fracture. IMPRESSION: 1. Large right extra-axial acute hemorrhage with mass effect and midline shift as described. Maximal thickness is 11 mm on coronal imaging with 4 mm of right-to-left midline shift. 2. No acute extracranial trauma. 3. Right periorbital soft tissue swelling without underlying fracture or globe injury. 4. Atherosclerosis. These results were called by telephone at the time of interpretation on 08/21/2018 at 1:10 pm to Dr. Lennice Sites , who verbally acknowledged these results. Electronically Signed   By: San Morelle M.D.   On: 08/21/2018 13:17   Ct Maxillofacial Wo Contrast  Result Date: 08/21/2018 CLINICAL DATA:  Head trauma, minor. Bruising to the right side. Possible fall. Headache and lethargy. EXAM: CT HEAD WITHOUT CONTRAST CT MAXILLOFACIAL WITHOUT CONTRAST TECHNIQUE: Multidetector CT imaging of the head and maxillofacial structures were performed using the standard protocol without intravenous contrast. Multiplanar CT image reconstructions of the maxillofacial structures were also generated. COMPARISON:  None. FINDINGS: CT HEAD FINDINGS Brain: Extensive hyperdense extra-axial hemorrhage is present over the right hemisphere. This measures up to 11 mm on coronal imaging. There is significant mass effect with effacement of the sulci and midline shift of 4 mm at the foramen of Monro. There is partial effacement of the right lateral ventricle. No parenchymal or intraventricular hemorrhage is present. Acute cortical infarct is present. Basal ganglia are intact. The brainstem and cerebellum are normal. Vascular: Minimal atherosclerotic changes are present within the cavernous internal carotid arteries bilaterally. There is  no hyperdense vessel. Skull: Calvarium is intact. No focal lytic or blastic lesions are  present. No significant extracranial soft tissue injury is present. CT MAXILLOFACIAL FINDINGS Osseous: No acute or healing fractures are present. The bones are intact. Mandible is intact and located. Zygomatic arch is within normal limits bilaterally. Orbits: Mild right periorbital soft tissue swelling is present. There is no underlying fracture. The globes are within normal limits. Orbits are unremarkable. Cavernous sinus is within normal limits bilaterally. Sellar and suprasellar regions are unremarkable. Sinuses: The right frontal sinus and anterior ethmoid air cells are opacified. There is no associated fracture. Minimal mucosal thickening is present in the more inferior left ethmoid air cells. The mastoid air cells are clear. Soft tissues: Mild right periorbital soft tissue swelling is present. This extends over the right cheek. There is no underlying fracture. IMPRESSION: 1. Large right extra-axial acute hemorrhage with mass effect and midline shift as described. Maximal thickness is 11 mm on coronal imaging with 4 mm of right-to-left midline shift. 2. No acute extracranial trauma. 3. Right periorbital soft tissue swelling without underlying fracture or globe injury. 4. Atherosclerosis. These results were called by telephone at the time of interpretation on 08/21/2018 at 1:10 pm to Dr. Lennice Sites , who verbally acknowledged these results. Electronically Signed   By: San Morelle M.D.   On: 08/21/2018 13:17    Assessment/Plan: Bilateral subdural hematoma: The best I can tell the patient is at her baseline.  Hopefully she will not need surgery as she is not a great candidate given her age and dementia.  I will plan to repeat her CAT scan tomorrow.  LOS: 1 day     Ophelia Charter 08/23/2018, 9:25 AM

## 2018-08-23 NOTE — NC FL2 (Addendum)
[place] LEVEL OF CARE SCREENING TOOL     IDENTIFICATION  Patient Name: Jocelyn Mccann Birthdate: [birth year] Sex: female Admission Date (Current Location): 08/21/2018  [employer] and [place] Number:  [employer] and Address:  [facility] [place], [place] [address] [place], [place] [address]      Provider Number: [id number]  Attending Physician Name and Address:  Shelly Coss, MD  Relative Name and Phone Number:       Current Level of Care: Hospital Recommended Level of Care: [place] Prior Approval Number:     Date Approved/Denied:   PASRR Number:   [id number]  Discharge Plan: SNF    Current Diagnoses: Patient Active Problem List   Diagnosis Date Noted  . SDH (subdural hematoma) ([place]) 08/21/2018  . Malignant neoplasm of upper-outer quadrant of right breast in female, estrogen receptor positive ([place]) 12/20/2017  . Osteoarthritis of knee 06/01/2017  . Bilateral knee pain 03/31/2017  . Urge incontinence of urine 03/31/2017  . Generalized anxiety disorder 03/31/2017  . Osteoporosis without current pathological fracture 03/31/2017  . Dementia ([place]) 03/31/2017    Orientation RESPIRATION BLADDER Height & Weight     Self  Normal Incontinent Weight: 81.2 kg Height:  5\' 7"  (170.2 cm)  BEHAVIORAL SYMPTOMS/MOOD NEUROLOGICAL BOWEL NUTRITION STATUS      Continent Diet(heart healthy with thin liquid)  AMBULATORY STATUS COMMUNICATION OF NEEDS Skin   Extensive Assist Verbally Bruising(rt eye)                       Personal Care Assistance Level of Assistance  Bathing, Dressing, Feeding Bathing Assistance: Limited assistance Feeding assistance: Limited assistance Dressing Assistance: Limited assistance     Functional Limitations Info  Sight, Hearing, Speech Sight Info: Impaired Hearing Info: Adequate Speech Info: Adequate    SPECIAL CARE FACTORS FREQUENCY  PT (By licensed PT), OT (By licensed OT), Speech  therapy     PT Frequency: 5x/wk OT Frequency: 5x/wk     Speech Therapy Frequency: 5x/wk      Contractures Contractures Info: Not present    Additional Factors Info  Code Status, Allergies Code Status Info: Full Allergies Info: NKA           Current Medications (08/23/2018):  This is the current hospital active medication list Current Facility-Administered Medications  Medication Dose Route Frequency Provider Last Rate Last Dose  . acetaminophen (TYLENOL) tablet 650 mg  650 mg Oral Q6H PRN Karmen Bongo, MD   650 mg at 08/23/18 3149   Or  . acetaminophen (TYLENOL) suppository 650 mg  650 mg Rectal Q6H PRN Karmen Bongo, MD      . anastrozole (ARIMIDEX) tablet 1 mg  1 mg Oral Daily Karmen Bongo, MD   1 mg at 08/23/18 1012  . docusate sodium (COLACE) capsule 100 mg  100 mg Oral BID Karmen Bongo, MD   100 mg at 08/23/18 1012  . labetalol (NORMODYNE) injection 10 mg  10 mg Intravenous Q4H PRN Barb Merino, MD   10 mg at 08/22/18 1819  . ondansetron (ZOFRAN) tablet 4 mg  4 mg Oral Q6H PRN Karmen Bongo, MD       Or  . ondansetron [facility]) injection 4 mg  4 mg Intravenous Q6H PRN Karmen Bongo, MD      . sodium chloride flush (NS) 0.9 % injection 3 mL  3 mL Intravenous Q12H Karmen Bongo, MD   3 mL at 08/23/18 1016  . traMADol (  ULTRAM) tablet 50 mg  50 mg Oral Q6H PRN Barb Merino, MD   50 mg at 08/23/18 1225     Discharge Medications: Please see discharge summary for a list of discharge medications.  Relevant Imaging Results:  Relevant Lab Results:   Additional Information SS#: [id number]  [name] Friar, RN

## 2018-08-23 NOTE — TOC Initial Note (Signed)
Transition of Care [facility]) - Initial/Assessment Note    Patient Details  Name: Jocelyn Mccann MRN: [id number] Date of Birth: 1928-06-26  Transition of Care [facility]) CM/SW Contact:    Jocelyn Friar, RN Phone Number: 08/23/2018, 4:54 PM  Clinical Narrative:                 CM met with patient and her son. Also had daughter on speaker phone. They would like SNF rehab for their mother. CM completed FL2 and faxed her out. CM provided son Medicare.com list of facilities. TOC following.  Expected Discharge Plan: Skilled Nursing Facility Barriers to Discharge: Continued Medical Work up   Patient Goals and CMS Choice   CMS Medicare.gov Compare Post Acute Care list provided to:: Patient Represenative (must comment) Choice offered to / list presented to : Adult Children(son and daughter)  Expected Discharge Plan and Services Expected Discharge Plan: Jocelyn Mccann In-house Referral: Clinical Social Work Discharge Planning Services: CM Consult Post Acute Care Choice: [place] arrangements for the past 2 months: [place]                                      Prior Living Arrangements/Services Living arrangements for the past 2 months: Single Family Home Lives with:: Adult Children Patient language and need for interpreter reviewed:: Yes(no needs) Do you feel safe going back to the place where you live?: Yes      Need for Family Participation in Patient Care: Yes (Comment) Care giver support system in place?: Yes (comment)(family active with the patient)   Criminal Activity/Legal Involvement Pertinent to Current Situation/Hospitalization: No - Comment as needed  Activities of Daily Living      Permission Sought/Granted                  Emotional Assessment Appearance:: Appears stated age Attitude/Demeanor/Rapport: Gracious Affect (typically observed): Accepting, Calm Orientation: : Oriented to Self   Psych Involvement: No  (comment)  Admission diagnosis:  SDH (subdural hematoma) ([employer]) [S06.5X9A] Patient Active Problem List   Diagnosis Date Noted  . SDH (subdural hematoma) ([place]) 08/21/2018  . Malignant neoplasm of upper-outer quadrant of right breast in female, estrogen receptor positive ([place]) 12/20/2017  . Osteoarthritis of knee 06/01/2017  . Bilateral knee pain 03/31/2017  . Urge incontinence of urine 03/31/2017  . Generalized anxiety disorder 03/31/2017  . Osteoporosis without current pathological fracture 03/31/2017  . Dementia ([place]) 03/31/2017   PCP:  Jocelyn Sheldon, PA-C Pharmacy:   [id number] [id number]: [phone]: [phone]    Social Determinants of Health (SDOH) Interventions    Readmission Risk Interventions No flowsheet data found.

## 2018-08-23 NOTE — Progress Notes (Signed)
CM reached out to patients daughter about d/c plans and recommendations. Daughter is unsure of what they want to do and states her brother will be up to visit at 2:30 and asked that CM meet with him in the room. CM will meet with the son at 2:30. TOC following.

## 2018-08-24 ENCOUNTER — Inpatient Hospital Stay (HOSPITAL_COMMUNITY): Payer: Medicare HMO

## 2018-08-24 MED ORDER — HALOPERIDOL LACTATE 5 MG/ML IJ SOLN
2.0000 mg | Freq: Once | INTRAMUSCULAR | Status: AC
  Administered 2018-08-24: 2 mg via INTRAVENOUS
  Filled 2018-08-24: qty 1

## 2018-08-24 NOTE — Progress Notes (Signed)
Subjective: The patient is alert and pleasant.  She has no complaints.  Objective: Vital signs in last 24 hours: Temp:  [97.7 F (36.5 C)-98.6 F (37 C)] 98.2 F (36.8 C) (08/13 0447) Pulse Rate:  [66-84] 78 (08/13 0447) Resp:  [15-32] 18 (08/13 0447) BP: (115-163)/(64-85) 163/85 (08/13 0447) SpO2:  [99 %-100 %] 100 % (08/13 0447) Estimated body mass index is 28.04 kg/m as calculated from the following:   Height as of this encounter: 5\' 7"  (1.702 m).   Weight as of this encounter: 81.2 kg.   Intake/Output from previous day: 08/12 0701 - 08/13 0700 In: 116 [P.O.:116] Out: 800 [Urine:800] Intake/Output this shift: No intake/output data recorded.  Physical exam the patient is alert and pleasant.  She is oriented x1.  She is moving all 4 extremities well.  Lab Results: Recent Labs    08/21/18 1048 08/22/18 0333  WBC 2.6* 2.9*  HGB 12.3 11.6*  HCT 36.7 33.8*  PLT 177 179   BMET Recent Labs    08/21/18 1048 08/22/18 0333  NA 139 139  K 3.5 3.4*  CL 105 103  CO2 25 26  GLUCOSE 141* 104*  BUN 9 12  CREATININE 0.76 0.89  CALCIUM 9.4 9.3    Studies/Results: Dg Knee 1-2 Views Left  Result Date: 08/22/2018 CLINICAL DATA:  Medial LEFT knee pain, possible fall. EXAM: LEFT KNEE - 1-2 VIEW COMPARISON:  None. FINDINGS: Advanced degenerative change at the lateral compartment with abutment of the lateral femoral condyle and lateral tibial plateau and with associated articular surface sclerosis and osteophyte formation. Medial compartment is relatively well preserved. No acute appearing osseous abnormality. No fracture line or displaced fracture fragment seen. Probable small joint effusion within the suprapatellar bursa. Soft tissues about the LEFT knee are unremarkable. IMPRESSION: 1. Advanced degenerative change at the lateral compartment, as detailed above. 2. No acute appearing osseous abnormality. No fracture or acute dislocation. 3. Probable small joint effusion.  Electronically Signed   By: Franki Cabot M.D.   On: 08/22/2018 10:46    Assessment/Plan: Bilateral subdural hematomas: The patient is doing well clinically.  She is not a good surgical candidate.  If her follow-up CT is stable, she can be discharged to home or to a skilled nursing facility and follow-up with me in the office in a week or 2.  LOS: 2 days     Ophelia Charter 08/24/2018, 8:04 AM

## 2018-08-24 NOTE — TOC Progression Note (Signed)
Transition of Care [facility]) - Progression Note    Patient Details  Name: Jocelyn Mccann MRN: [id number] Date of Birth: August 24, 1928  Transition of Care [facility]) CM/SW Contact  [name] Friar, RN Phone Number: 08/24/2018, 1:58 PM  Clinical Narrative:    Family asked for [facility], they have extended a bed offer. They are going to start insurance authorization for a potential d/c tomorrow. TOC following.   Expected Discharge Plan: [place] Barriers to Discharge: Continued Medical Work up  Expected Discharge Plan and Services Expected Discharge Plan: [place] In-house Referral: Clinical Social Work Discharge Planning Services: CM Consult Post Acute Care Choice: [place] arrangements for the past 2 months: Single Family Home                                       Social Determinants of Health (SDOH) Interventions    Readmission Risk Interventions No flowsheet data found.

## 2018-08-24 NOTE — Progress Notes (Signed)
PROGRESS NOTE    Jocelyn Mccann  VOJ:[id number] DOB: 1928/03/10 DOA: 08/21/2018 PCP: Orlena Sheldon, PA-C   Brief Narrative:  Patient is 85 year old female with history of dementia, breast cancer currently on anastrozole who presented from home to the emergency department with a fall and headache.  She was complaining of headache and left knee pain on presentation.  She was hemodynamically stable on presentation.  Brain imaging showed 11 mm subdural hematoma on the right side with no acute neurological deficits.  Neurosurgery consulted after admission.  Repeat CT head on 08/24/18 showed stable changes.  PT/OT recommending skilled nursing facility placement.  Assessment & Plan:   Active Problems:   SDH (subdural hematoma) (HCC)   Bilateral subdural hematoma: Right more than left with minimal midline shift.  No neurological deficits.  Neurosurgery following.  No need of acute surgical intervention.  Repeat CT head on 08/24/18 showed stable changes. Neurosurgery recommended to follow-up as an outpatient in 1-2 weeks.  Fall: Patient followed by PT/OT.  Recommended skilled nursing facility.  Social Development worker, community followinjg.  Advanced dementia: Lives with family at home.  Continue fall precautions.  Continue supportive care  Hypokalemia: Supplemented and corrected.  History of breast cancer: Currently on remission.  On anastrozole.         DVT prophylaxis: SCD Code Status: Full Family Communication: Called daughter on phone,call not received Disposition Plan: SNF   Consultants: Neurosurgery  Procedures: None  Antimicrobials:  Anti-infectives (From admission, onward)   None      Subjective:  Patient seen and examined the bedside this morning.  Looks very comfortable.  No active issues.  Denies any complaints.  Oriented to self only  Objective: Vitals:   08/23/18 2344 08/24/18 0447 08/24/18 0900 08/24/18 1100  BP: 136/71 (!) 163/85 112/82 (!) 147/76  Pulse: 81 78  (!) 104 85  Resp: 18 18 18 18   Temp: 98.6 F (37 C) 98.2 F (36.8 C) 98.1 F (36.7 C) (!) 97.3 F (36.3 C)  TempSrc: Oral Oral Oral Oral  SpO2: 100% 100% 100% 100%  Weight:      Height:        Intake/Output Summary (Last 24 hours) at 08/24/2018 1154 Last data filed at 08/24/2018 1100 Gross per 24 hour  Intake 200 ml  Output 950 ml  Net -750 ml   Filed Weights   08/21/18 1030  Weight: 81.2 kg    Examination:  General exam: Elderly debilitated female HEENT:PERRL,Oral mucosa moist, Ear/Nose normal on gross exam Respiratory system: Bilateral equal air entry, normal vesicular breath sounds, no wheezes or crackles  Cardiovascular system: S1 & S2 heard, RRR. No JVD, murmurs, rubs, gallops or clicks. Gastrointestinal system: Abdomen is nondistended, soft and nontender. No organomegaly or masses felt. Normal bowel sounds heard. Central nervous system: Alert and awake but not oriented  extremities: No edema, no clubbing ,no cyanosis, distal peripheral pulses palpable. Skin: No rashes, lesions or ulcers,no icterus ,no pallor Psychiatry: Judgement and insight appear impaired    Data Reviewed: I have personally reviewed following labs and imaging studies  CBC: Recent Labs  Lab 08/21/18 1048 08/22/18 0333  WBC 2.6* 2.9*  NEUTROABS 1.6*  --   HGB 12.3 11.6*  HCT 36.7 33.8*  MCV 103.7* 102.1*  PLT 177 993   Basic Metabolic Panel: Recent Labs  Lab 08/21/18 1048 08/22/18 0333  NA 139 139  K 3.5 3.4*  CL 105 103  CO2 25 26  GLUCOSE 141* 104*  BUN 9 12  CREATININE 0.76 0.89  CALCIUM 9.4 9.3   GFR: Estimated Creatinine Clearance: 47.9 mL/min (by C-G formula based on SCr of 0.89 mg/dL). Liver Function Tests: No results for input(s): AST, ALT, ALKPHOS, BILITOT, PROT, ALBUMIN in the last 168 hours. No results for input(s): LIPASE, AMYLASE in the last 168 hours. No results for input(s): AMMONIA in the last 168 hours. Coagulation Profile: Recent Labs  Lab 08/21/18 1048   INR 1.1   Cardiac Enzymes: No results for input(s): CKTOTAL, CKMB, CKMBINDEX, TROPONINI in the last 168 hours. BNP (last 3 results) No results for input(s): PROBNP in the last 8760 hours. HbA1C: No results for input(s): HGBA1C in the last 72 hours. CBG: Recent Labs  Lab 08/21/18 1033  GLUCAP 124*   Lipid Profile: No results for input(s): CHOL, HDL, LDLCALC, TRIG, CHOLHDL, LDLDIRECT in the last 72 hours. Thyroid Function Tests: No results for input(s): TSH, T4TOTAL, FREET4, T3FREE, THYROIDAB in the last 72 hours. Anemia Panel: No results for input(s): VITAMINB12, FOLATE, FERRITIN, TIBC, IRON, RETICCTPCT in the last 72 hours. Sepsis Labs: No results for input(s): PROCALCITON, LATICACIDVEN in the last 168 hours.  Recent Results (from the past 240 hour(s))  SARS Coronavirus 2 [facility] order, Performed in [facility] hospital lab) Nasopharyngeal Nasopharyngeal Swab     Status: None   Collection Time: 08/21/18  1:47 PM   Specimen: Nasopharyngeal Swab  Result Value Ref Range Status   SARS Coronavirus 2 NEGATIVE NEGATIVE Final    Comment: (NOTE) If result is NEGATIVE SARS-CoV-2 target nucleic acids are NOT DETECTED. The SARS-CoV-2 RNA is generally detectable in upper and lower  respiratory specimens during the acute phase of infection. The lowest  concentration of SARS-CoV-2 viral copies this assay can detect is 250  copies / mL. A negative result does not preclude SARS-CoV-2 infection  and should not be used as the sole basis for treatment or other  patient management decisions.  A negative result may occur with  improper specimen collection / handling, submission of specimen other  than nasopharyngeal swab, presence of viral mutation(s) within the  areas targeted by this assay, and inadequate number of viral copies  (<250 copies / mL). A negative result must be combined with clinical  observations, patient history, and epidemiological information. If result is POSITIVE  SARS-CoV-2 target nucleic acids are DETECTED. The SARS-CoV-2 RNA is generally detectable in upper and lower  respiratory specimens dur ing the acute phase of infection.  Positive  results are indicative of active infection with SARS-CoV-2.  Clinical  correlation with patient history and other diagnostic information is  necessary to determine patient infection status.  Positive results do  not rule out bacterial infection or co-infection with other viruses. If result is PRESUMPTIVE POSTIVE SARS-CoV-2 nucleic acids MAY BE PRESENT.   A presumptive positive result was obtained on the submitted specimen  and confirmed on repeat testing.  While 2019 novel coronavirus  (SARS-CoV-2) nucleic acids may be present in the submitted sample  additional confirmatory testing may be necessary for epidemiological  and / or clinical management purposes  to differentiate between  SARS-CoV-2 and other Sarbecovirus currently known to infect humans.  If clinically indicated additional testing with an alternate test  methodology [phone]) is advised. The SARS-CoV-2 RNA is generally  detectable in upper and lower respiratory sp ecimens during the acute  phase of infection. The expected result is Negative. Fact Sheet for Patients:  StrictlyIdeas.no Fact Sheet for Healthcare Providers: BankingDealers.co.za This test is not yet approved or cleared by  the [employer] and has been authorized for detection and/or diagnosis of SARS-CoV-2 by FDA under an Emergency Use Authorization (EUA).  This EUA will remain in effect (meaning this test can be used) for the duration of the COVID-19 declaration under Section 564(b)(1) of the Act, 21 U.S.C. section 360bbb-3(b)(1), unless the authorization is terminated or revoked sooner. Performed at [facility], [place] [address] [place], [place] [address]          Radiology Studies: Ct Head Wo Contrast  Result  Date: 08/24/2018 CLINICAL DATA:  Follow-up the cranial hemorrhage EXAM: CT HEAD WITHOUT CONTRAST TECHNIQUE: Contiguous axial images were obtained from the base of the skull through the vertex without intravenous contrast. COMPARISON:  Two days ago FINDINGS: Brain: Left larger than right subdural hematoma along the cerebral convexities and to a lesser extent along the posterior falx and tentorium on the right. The hematomas are both intermediate and high density. Maximal thickness on the right is 9 mm, stable. Maximal thickness on the left is 4 mm, also stable. Midline shift measures 4 mm, stable if not improved. Mild small vessel ischemic change in the deep white matter. No hydrocephalus. Vascular: No hyperdense vessel or unexpected calcification. Skull: Negative Sinuses/Orbits: Right frontal sinus opacification, likely chronic IMPRESSION: Unchanged right more than left subdural hematoma. Midline shift continues to measure 5 mm. Electronically Signed   By: Monte Fantasia M.D.   On: 08/24/2018 11:27        Scheduled Meds: . anastrozole  1 mg Oral Daily  . docusate sodium  100 mg Oral BID  . sodium chloride flush  3 mL Intravenous Q12H   Continuous Infusions:   LOS: 2 days    Time spent: 25 min. More than 50% of that time was spent in counseling and/or coordination of care.      Shelly Coss, MD [facility] Pager [phone]  If 7PM-7AM, please contact night-coverage [email or web address] Password [facility] 08/24/2018, 11:54 AM

## 2018-08-24 NOTE — Progress Notes (Signed)
I have reviewed the patient's follow-up head CT performed today.  It demonstrates no significant change in her subdural hematomas.  She can be discharged from my point of view and follow-up with me in the office in a week or 2.  I will sign off.  Please call if I can be of further assistance.

## 2018-08-25 LAB — URINALYSIS, ROUTINE W REFLEX MICROSCOPIC
Bilirubin Urine: NEGATIVE
Glucose, UA: NEGATIVE mg/dL
Ketones, ur: NEGATIVE mg/dL
Nitrite: NEGATIVE
Protein, ur: NEGATIVE mg/dL
Specific Gravity, Urine: 1.017 (ref 1.005–1.030)
pH: 6 (ref 5.0–8.0)

## 2018-08-25 LAB — SARS CORONAVIRUS 2 (TAT 6-24 HRS): SARS Coronavirus 2: NEGATIVE

## 2018-08-25 MED ORDER — HALOPERIDOL LACTATE 5 MG/ML IJ SOLN
2.0000 mg | Freq: Four times a day (QID) | INTRAMUSCULAR | Status: DC | PRN
  Administered 2018-08-25: 2 mg via INTRAVENOUS
  Filled 2018-08-25: qty 1

## 2018-08-25 MED ORDER — TRAMADOL HCL 50 MG PO TABS: 50.0000 mg | ORAL_TABLET | Freq: Four times a day (QID) | ORAL | 0 refills | Status: DC | PRN

## 2018-08-25 MED ORDER — QUETIAPINE FUMARATE 25 MG PO TABS
25.0000 mg | ORAL_TABLET | Freq: Every day | ORAL | Status: DC
  Administered 2018-08-25: 25 mg via ORAL
  Filled 2018-08-25: qty 1

## 2018-08-25 MED ORDER — QUETIAPINE FUMARATE 25 MG PO TABS: 25.0000 mg | ORAL_TABLET | Freq: Every day | ORAL | Status: DC

## 2018-08-25 NOTE — Progress Notes (Signed)
Assist patient up x 2 person to [facility]

## 2018-08-25 NOTE — Discharge Summary (Addendum)
Physician Discharge Summary  Jocelyn Mccann RXV:[id number] DOB: 1928-05-01 DOA: 08/21/2018  PCP: Orlena Sheldon, PA-C  Admit date: 08/21/2018 Discharge date: 08/26/18 Admitted From: Home Disposition: SNF  Discharge Condition:Stable CODE STATUS:FULL Diet recommendation: Regular  Brief/Interim Summary:  Patient is 85 year old female with history of dementia, breast cancer currently on anastrozole who presented from home to the emergency department with a fall and headache.  She was complaining of headache and left knee pain on presentation.  She was hemodynamically stable on presentation.  Brain imaging showed 11 mm subdural hematoma on the right side with no acute neurological deficits.  Neurosurgery consulted after admission.  Repeat CT head on 08/24/18 showed stable changes.  PT/OT recommending skilled nursing facility placement.  Patient is hemodynamically stable for discharge to skilled nursing facility today.  Following problems were addressed during her hospitalization:  Bilateral subdural hematoma: Right more than left with minimal midline shift.  No neurological deficits.  Neurosurgery following.  No need of acute surgical intervention.  Repeat CT head on 08/24/18 showed stable changes. Neurosurgery recommended to follow-up as an outpatient in 1-2 weeks.  Fall: Patient followed by PT/OT.  Recommended skilled nursing facility.  Social Development worker, community followinjg.  Advanced dementia: Lives with family at home.  Continue fall precautions.  Continue supportive care  Hypokalemia: Supplemented and corrected.  History of breast cancer: Currently on remission.  On anastrozole.      Discharge Diagnoses:  Active Problems:   SDH (subdural hematoma) [facility])    Discharge Instructions  Discharge Instructions    Diet - low sodium heart healthy   Complete by: As directed    Discharge instructions   Complete by: As directed    1)Take prescribed medications as instructed. 2)  Follow-up with neurosurgery as an outpatient in a week.  Name and number the provider has been attached   Increase activity slowly   Complete by: As directed      Allergies as of 08/25/2018   No Known Allergies     Medication List    TAKE these medications   anastrozole 1 MG tablet Commonly known as: ARIMIDEX TAKE 1 TABLET BY MOUTH EVERY DAY   cholecalciferol 25 MCG (1000 UT) tablet Commonly known as: VITAMIN D3 Take 1 tablet (1,000 Units total) by mouth daily.   QUEtiapine 25 MG tablet Commonly known as: SEROQUEL Take 1 tablet (25 mg total) by mouth at bedtime.   traMADol 50 MG tablet Commonly known as: ULTRAM Take 1 tablet (50 mg total) by mouth every 6 (six) hours as needed for moderate pain.       Contact information for follow-up providers    Newman Pies, MD. Schedule an appointment as soon as possible for a visit in 1 week(s).   Specialty: Neurosurgery Contact information: [address] [place] [address] [phone]            Contact information for after-discharge care    Destination    [facility] Preferred SNF .   Service: Skilled Nursing Contact information: [place] [phone]                 No Known Allergies  Consultations:  neurosurgery   Procedures/Studies: Dg Knee 1-2 Views Left  Result Date: 08/22/2018 CLINICAL DATA:  Medial LEFT knee pain, possible fall. EXAM: LEFT KNEE - 1-2 VIEW COMPARISON:  None. FINDINGS: Advanced degenerative change at the lateral compartment with abutment of the lateral femoral condyle and lateral  tibial plateau and with associated articular surface sclerosis and osteophyte formation. Medial compartment is relatively well preserved. No acute appearing osseous abnormality. No fracture line or displaced fracture fragment seen. Probable small joint effusion within the suprapatellar bursa. Soft tissues about the LEFT  knee are unremarkable. IMPRESSION: 1. Advanced degenerative change at the lateral compartment, as detailed above. 2. No acute appearing osseous abnormality. No fracture or acute dislocation. 3. Probable small joint effusion. Electronically Signed   By: Franki Cabot M.D.   On: 08/22/2018 10:46   Ct Head Wo Contrast  Result Date: 08/24/2018 CLINICAL DATA:  Follow-up the cranial hemorrhage EXAM: CT HEAD WITHOUT CONTRAST TECHNIQUE: Contiguous axial images were obtained from the base of the skull through the vertex without intravenous contrast. COMPARISON:  Two days ago FINDINGS: Brain: Left larger than right subdural hematoma along the cerebral convexities and to a lesser extent along the posterior falx and tentorium on the right. The hematomas are both intermediate and high density. Maximal thickness on the right is 9 mm, stable. Maximal thickness on the left is 4 mm, also stable. Midline shift measures 4 mm, stable if not improved. Mild small vessel ischemic change in the deep white matter. No hydrocephalus. Vascular: No hyperdense vessel or unexpected calcification. Skull: Negative Sinuses/Orbits: Right frontal sinus opacification, likely chronic IMPRESSION: Unchanged right more than left subdural hematoma. Midline shift continues to measure 5 mm. Electronically Signed   By: Monte Fantasia M.D.   On: 08/24/2018 11:27   Ct Head Wo Contrast  Result Date: 08/22/2018 CLINICAL DATA:  85 year old female with subdural hemorrhage follow-up. EXAM: CT HEAD WITHOUT CONTRAST TECHNIQUE: Contiguous axial images were obtained from the base of the skull through the vertex without intravenous contrast. COMPARISON:  Head CT dated 08/21/2018 FINDINGS: Brain: Right hemispheric subdural hemorrhage measure up to 8 mm in thickness on the axial view and 11 mm on the coronal view over the right temporal lobe. There is layering of the subdural hemorrhage along the right side of the tentorium. There has been interval development of a  left-sided subdural hemorrhage over the left temporal lobe measuring approximately 6 mm in thickness. There is mass effect and approximately 5 mm right to left midline shift. No transtentorial herniation. A small posterior pelvis seen subdural hemorrhage is also noted. No intraventricular hemorrhage. There is moderate age-related atrophy and chronic microvascular ischemic changes. Vascular: No hyperdense vessel or unexpected calcification. Skull: Normal. Negative for fracture or focal lesion. Sinuses/Orbits: There is opacification of the right frontal sinus as well as opacification of multiple ethmoid air cells. No air-fluid level. The mastoid air cells are clear. Other: None IMPRESSION: 1. Right hemispheric subdural hemorrhage with no significant interval change in size since the prior CT and appears redistributed along the right tentorium and posterior falx. 2. Interval development of a left subdural hemorrhage over the temporal lobe. 3. Mass effect and approximately 5 mm left-to-right midline shift. 4. Continued close follow-up recommended. These results were called by telephone at the time of interpretation on 08/22/2018 at 12:50 am to nurse practitioner Hollace Hayward who verbally acknowledged these results. Electronically Signed   By: Anner Crete M.D.   On: 08/22/2018 00:59   Ct Head Wo Contrast  Result Date: 08/21/2018 CLINICAL DATA:  Head trauma, minor. Bruising to the right side. Possible fall. Headache and lethargy. EXAM: CT HEAD WITHOUT CONTRAST CT MAXILLOFACIAL WITHOUT CONTRAST TECHNIQUE: Multidetector CT imaging of the head and maxillofacial structures were performed using the standard protocol without intravenous contrast. Multiplanar CT image  reconstructions of the maxillofacial structures were also generated. COMPARISON:  None. FINDINGS: CT HEAD FINDINGS Brain: Extensive hyperdense extra-axial hemorrhage is present over the right hemisphere. This measures up to 11 mm on coronal imaging. There  is significant mass effect with effacement of the sulci and midline shift of 4 mm at the foramen of Monro. There is partial effacement of the right lateral ventricle. No parenchymal or intraventricular hemorrhage is present. Acute cortical infarct is present. Basal ganglia are intact. The brainstem and cerebellum are normal. Vascular: Minimal atherosclerotic changes are present within the cavernous internal carotid arteries bilaterally. There is no hyperdense vessel. Skull: Calvarium is intact. No focal lytic or blastic lesions are present. No significant extracranial soft tissue injury is present. CT MAXILLOFACIAL FINDINGS Osseous: No acute or healing fractures are present. The bones are intact. Mandible is intact and located. Zygomatic arch is within normal limits bilaterally. Orbits: Mild right periorbital soft tissue swelling is present. There is no underlying fracture. The globes are within normal limits. Orbits are unremarkable. Cavernous sinus is within normal limits bilaterally. Sellar and suprasellar regions are unremarkable. Sinuses: The right frontal sinus and anterior ethmoid air cells are opacified. There is no associated fracture. Minimal mucosal thickening is present in the more inferior left ethmoid air cells. The mastoid air cells are clear. Soft tissues: Mild right periorbital soft tissue swelling is present. This extends over the right cheek. There is no underlying fracture. IMPRESSION: 1. Large right extra-axial acute hemorrhage with mass effect and midline shift as described. Maximal thickness is 11 mm on coronal imaging with 4 mm of right-to-left midline shift. 2. No acute extracranial trauma. 3. Right periorbital soft tissue swelling without underlying fracture or globe injury. 4. Atherosclerosis. These results were called by telephone at the time of interpretation on 08/21/2018 at 1:10 pm to Dr. Lennice Sites , who verbally acknowledged these results. Electronically Signed   By: San Morelle M.D.   On: 08/21/2018 13:17   Ct Maxillofacial Wo Contrast  Result Date: 08/21/2018 CLINICAL DATA:  Head trauma, minor. Bruising to the right side. Possible fall. Headache and lethargy. EXAM: CT HEAD WITHOUT CONTRAST CT MAXILLOFACIAL WITHOUT CONTRAST TECHNIQUE: Multidetector CT imaging of the head and maxillofacial structures were performed using the standard protocol without intravenous contrast. Multiplanar CT image reconstructions of the maxillofacial structures were also generated. COMPARISON:  None. FINDINGS: CT HEAD FINDINGS Brain: Extensive hyperdense extra-axial hemorrhage is present over the right hemisphere. This measures up to 11 mm on coronal imaging. There is significant mass effect with effacement of the sulci and midline shift of 4 mm at the foramen of Monro. There is partial effacement of the right lateral ventricle. No parenchymal or intraventricular hemorrhage is present. Acute cortical infarct is present. Basal ganglia are intact. The brainstem and cerebellum are normal. Vascular: Minimal atherosclerotic changes are present within the cavernous internal carotid arteries bilaterally. There is no hyperdense vessel. Skull: Calvarium is intact. No focal lytic or blastic lesions are present. No significant extracranial soft tissue injury is present. CT MAXILLOFACIAL FINDINGS Osseous: No acute or healing fractures are present. The bones are intact. Mandible is intact and located. Zygomatic arch is within normal limits bilaterally. Orbits: Mild right periorbital soft tissue swelling is present. There is no underlying fracture. The globes are within normal limits. Orbits are unremarkable. Cavernous sinus is within normal limits bilaterally. Sellar and suprasellar regions are unremarkable. Sinuses: The right frontal sinus and anterior ethmoid air cells are opacified. There is no associated fracture. Minimal mucosal  thickening is present in the more inferior left ethmoid air cells. The mastoid  air cells are clear. Soft tissues: Mild right periorbital soft tissue swelling is present. This extends over the right cheek. There is no underlying fracture. IMPRESSION: 1. Large right extra-axial acute hemorrhage with mass effect and midline shift as described. Maximal thickness is 11 mm on coronal imaging with 4 mm of right-to-left midline shift. 2. No acute extracranial trauma. 3. Right periorbital soft tissue swelling without underlying fracture or globe injury. 4. Atherosclerosis. These results were called by telephone at the time of interpretation on 08/21/2018 at 1:10 pm to Dr. Lennice Sites , who verbally acknowledged these results. Electronically Signed   By: San Morelle M.D.   On: 08/21/2018 13:17       Subjective:  Patient seen and examined the bedside this morning.  Hemodynamically stable.  Confused.  Stable for discharge.  Discharge Exam: Vitals:   08/25/18 0940 08/25/18 1313  BP: (!) 150/89 (!) 152/73  Pulse: 96 93  Resp: 20 16  Temp: 99.7 F (37.6 C) 98.4 F (36.9 C)  SpO2: 100% 100%   Vitals:   08/24/18 2305 08/25/18 0603 08/25/18 0940 08/25/18 1313  BP: (!) 168/86 (!) 207/81 (!) 150/89 (!) 152/73  Pulse: 86 86 96 93  Resp: 18 18 20 16   Temp: 98.5 F (36.9 C) 98.8 F (37.1 C) 99.7 F (37.6 C) 98.4 F (36.9 C)  TempSrc: Oral Oral Axillary Oral  SpO2: 100% 100% 100% 100%  Weight:      Height:        General: Pt is alert, awake, not in acute distress,confused Cardiovascular: RRR, S1/S2 +, no rubs, no gallops Respiratory: CTA bilaterally, no wheezing, no rhonchi Abdominal: Soft, NT, ND, bowel sounds + Extremities: no edema, no cyanosis    The results of significant diagnostics from this hospitalization (including imaging, microbiology, ancillary and laboratory) are listed below for reference.     Microbiology: Recent Results (from the past 240 hour(s))  SARS Coronavirus 2 [facility] order, Performed in [facility] hospital lab) Nasopharyngeal  Nasopharyngeal Swab     Status: None   Collection Time: 08/21/18  1:47 PM   Specimen: Nasopharyngeal Swab  Result Value Ref Range Status   SARS Coronavirus 2 NEGATIVE NEGATIVE Final    Comment: (NOTE) If result is NEGATIVE SARS-CoV-2 target nucleic acids are NOT DETECTED. The SARS-CoV-2 RNA is generally detectable in upper and lower  respiratory specimens during the acute phase of infection. The lowest  concentration of SARS-CoV-2 viral copies this assay can detect is 250  copies / mL. A negative result does not preclude SARS-CoV-2 infection  and should not be used as the sole basis for treatment or other  patient management decisions.  A negative result may occur with  improper specimen collection / handling, submission of specimen other  than nasopharyngeal swab, presence of viral mutation(s) within the  areas targeted by this assay, and inadequate number of viral copies  (<250 copies / mL). A negative result must be combined with clinical  observations, patient history, and epidemiological information. If result is POSITIVE SARS-CoV-2 target nucleic acids are DETECTED. The SARS-CoV-2 RNA is generally detectable in upper and lower  respiratory specimens dur ing the acute phase of infection.  Positive  results are indicative of active infection with SARS-CoV-2.  Clinical  correlation with patient history and other diagnostic information is  necessary to determine patient infection status.  Positive results do  not rule out bacterial infection or co-infection  with other viruses. If result is PRESUMPTIVE POSTIVE SARS-CoV-2 nucleic acids MAY BE PRESENT.   A presumptive positive result was obtained on the submitted specimen  and confirmed on repeat testing.  While 2019 novel coronavirus  (SARS-CoV-2) nucleic acids may be present in the submitted sample  additional confirmatory testing may be necessary for epidemiological  and / or clinical management purposes  to differentiate between   SARS-CoV-2 and other Sarbecovirus currently known to infect humans.  If clinically indicated additional testing with an alternate test  methodology [phone]) is advised. The SARS-CoV-2 RNA is generally  detectable in upper and lower respiratory sp ecimens during the acute  phase of infection. The expected result is Negative. Fact Sheet for Patients:  StrictlyIdeas.no Fact Sheet for Healthcare Providers: BankingDealers.co.za This test is not yet approved or cleared by the [place] FDA and has been authorized for detection and/or diagnosis of SARS-CoV-2 by FDA under an Emergency Use Authorization (EUA).  This EUA will remain in effect (meaning this test can be used) for the duration of the COVID-19 declaration under Section 564(b)(1) of the Act, 21 U.S.C. section 360bbb-3(b)(1), unless the authorization is terminated or revoked sooner. Performed at [facility], [place] [address] [place], [place] [address]      Labs: BNP (last 3 results) No results for input(s): BNP in the last 8760 hours. Basic Metabolic Panel: Recent Labs  Lab 08/21/18 1048 08/22/18 0333  NA 139 139  K 3.5 3.4*  CL 105 103  CO2 25 26  GLUCOSE 141* 104*  BUN 9 12  CREATININE 0.76 0.89  CALCIUM 9.4 9.3   Liver Function Tests: No results for input(s): AST, ALT, ALKPHOS, BILITOT, PROT, ALBUMIN in the last 168 hours. No results for input(s): LIPASE, AMYLASE in the last 168 hours. No results for input(s): AMMONIA in the last 168 hours. CBC: Recent Labs  Lab 08/21/18 1048 08/22/18 0333  WBC 2.6* 2.9*  NEUTROABS 1.6*  --   HGB 12.3 11.6*  HCT 36.7 33.8*  MCV 103.7* 102.1*  PLT 177 179   Cardiac Enzymes: No results for input(s): CKTOTAL, CKMB, CKMBINDEX, TROPONINI in the last 168 hours. BNP: Invalid input(s): POCBNP CBG: Recent Labs  Lab 08/21/18 1033  GLUCAP 124*   D-Dimer No results for input(s): DDIMER in the last 72 hours. Hgb  A1c No results for input(s): HGBA1C in the last 72 hours. Lipid Profile No results for input(s): CHOL, HDL, LDLCALC, TRIG, CHOLHDL, LDLDIRECT in the last 72 hours. Thyroid function studies No results for input(s): TSH, T4TOTAL, T3FREE, THYROIDAB in the last 72 hours.  Invalid input(s): FREET3 Anemia work up No results for input(s): VITAMINB12, FOLATE, FERRITIN, TIBC, IRON, RETICCTPCT in the last 72 hours. Urinalysis    Component Value Date/Time   COLORURINE AMBER (A) 08/25/2018 1238   APPEARANCEUR CLOUDY (A) 08/25/2018 1238   LABSPEC 1.017 08/25/2018 1238   PHURINE 6.0 08/25/2018 1238   GLUCOSEU NEGATIVE 08/25/2018 1238   HGBUR SMALL (A) 08/25/2018 1238   BILIRUBINUR NEGATIVE 08/25/2018 1238   KETONESUR NEGATIVE 08/25/2018 1238   PROTEINUR NEGATIVE 08/25/2018 1238   NITRITE NEGATIVE 08/25/2018 1238   LEUKOCYTESUR SMALL (A) 08/25/2018 1238   Sepsis Labs Invalid input(s): PROCALCITONIN,  WBC,  LACTICIDVEN Microbiology Recent Results (from the past 240 hour(s))  SARS Coronavirus 2 [facility] order, Performed in [facility] hospital lab) Nasopharyngeal Nasopharyngeal Swab     Status: None   Collection Time: 08/21/18  1:47 PM   Specimen: Nasopharyngeal Swab  Result Value Ref Range  Status   SARS Coronavirus 2 NEGATIVE NEGATIVE Final    Comment: (NOTE) If result is NEGATIVE SARS-CoV-2 target nucleic acids are NOT DETECTED. The SARS-CoV-2 RNA is generally detectable in upper and lower  respiratory specimens during the acute phase of infection. The lowest  concentration of SARS-CoV-2 viral copies this assay can detect is 250  copies / mL. A negative result does not preclude SARS-CoV-2 infection  and should not be used as the sole basis for treatment or other  patient management decisions.  A negative result may occur with  improper specimen collection / handling, submission of specimen other  than nasopharyngeal swab, presence of viral mutation(s) within the  areas targeted by this  assay, and inadequate number of viral copies  (<250 copies / mL). A negative result must be combined with clinical  observations, patient history, and epidemiological information. If result is POSITIVE SARS-CoV-2 target nucleic acids are DETECTED. The SARS-CoV-2 RNA is generally detectable in upper and lower  respiratory specimens dur ing the acute phase of infection.  Positive  results are indicative of active infection with SARS-CoV-2.  Clinical  correlation with patient history and other diagnostic information is  necessary to determine patient infection status.  Positive results do  not rule out bacterial infection or co-infection with other viruses. If result is PRESUMPTIVE POSTIVE SARS-CoV-2 nucleic acids MAY BE PRESENT.   A presumptive positive result was obtained on the submitted specimen  and confirmed on repeat testing.  While 2019 novel coronavirus  (SARS-CoV-2) nucleic acids may be present in the submitted sample  additional confirmatory testing may be necessary for epidemiological  and / or clinical management purposes  to differentiate between  SARS-CoV-2 and other Sarbecovirus currently known to infect humans.  If clinically indicated additional testing with an alternate test  methodology [phone]) is advised. The SARS-CoV-2 RNA is generally  detectable in upper and lower respiratory sp ecimens during the acute  phase of infection. The expected result is Negative. Fact Sheet for Patients:  StrictlyIdeas.no Fact Sheet for Healthcare Providers: BankingDealers.co.za This test is not yet approved or cleared by the [place] FDA and has been authorized for detection and/or diagnosis of SARS-CoV-2 by FDA under an Emergency Use Authorization (EUA).  This EUA will remain in effect (meaning this test can be used) for the duration of the COVID-19 declaration under Section 564(b)(1) of the Act, 21 U.S.C. section 360bbb-3(b)(1),  unless the authorization is terminated or revoked sooner. Performed at [facility], [place] [address], [place], [place] [address]     Please note: You were cared for by a hospitalist during your hospital stay. Once you are discharged, your primary care physician will handle any further medical issues. Please note that NO REFILLS for any discharge medications will be authorized once you are discharged, as it is imperative that you return to your primary care physician (or establish a relationship with a primary care physician if you do not have one) for your post hospital discharge needs so that they can reassess your need for medications and monitor your lab values.    Time coordinating discharge: 40 minutes  SIGNED:   Shelly Coss, MD  [facility] 08/25/2018, 1:46 PM Pager [phone]  If 7PM-7AM, please contact night-coverage [email or web address] Password [email or web address]

## 2018-08-25 NOTE — Progress Notes (Signed)
Family present in room with patient

## 2018-08-26 DIAGNOSIS — F29 Unspecified psychosis not due to a substance or known physiological condition: Secondary | ICD-10-CM | POA: Diagnosis not present

## 2018-08-26 DIAGNOSIS — W19XXXD Unspecified fall, subsequent encounter: Secondary | ICD-10-CM | POA: Diagnosis not present

## 2018-08-26 DIAGNOSIS — C50919 Malignant neoplasm of unspecified site of unspecified female breast: Secondary | ICD-10-CM | POA: Diagnosis not present

## 2018-08-26 DIAGNOSIS — F419 Anxiety disorder, unspecified: Secondary | ICD-10-CM | POA: Diagnosis not present

## 2018-08-26 DIAGNOSIS — R319 Hematuria, unspecified: Secondary | ICD-10-CM | POA: Diagnosis not present

## 2018-08-26 DIAGNOSIS — M810 Age-related osteoporosis without current pathological fracture: Secondary | ICD-10-CM | POA: Diagnosis not present

## 2018-08-26 DIAGNOSIS — R2681 Unsteadiness on feet: Secondary | ICD-10-CM | POA: Diagnosis not present

## 2018-08-26 DIAGNOSIS — N390 Urinary tract infection, site not specified: Secondary | ICD-10-CM | POA: Diagnosis not present

## 2018-08-26 DIAGNOSIS — M25562 Pain in left knee: Secondary | ICD-10-CM | POA: Diagnosis not present

## 2018-08-26 DIAGNOSIS — M2550 Pain in unspecified joint: Secondary | ICD-10-CM | POA: Diagnosis not present

## 2018-08-26 DIAGNOSIS — D72819 Decreased white blood cell count, unspecified: Secondary | ICD-10-CM | POA: Diagnosis not present

## 2018-08-26 DIAGNOSIS — D649 Anemia, unspecified: Secondary | ICD-10-CM | POA: Diagnosis not present

## 2018-08-26 DIAGNOSIS — S065X9A Traumatic subdural hemorrhage with loss of consciousness of unspecified duration, initial encounter: Secondary | ICD-10-CM | POA: Diagnosis not present

## 2018-08-26 DIAGNOSIS — M25561 Pain in right knee: Secondary | ICD-10-CM | POA: Diagnosis not present

## 2018-08-26 DIAGNOSIS — Z7401 Bed confinement status: Secondary | ICD-10-CM | POA: Diagnosis not present

## 2018-08-26 DIAGNOSIS — M170 Bilateral primary osteoarthritis of knee: Secondary | ICD-10-CM | POA: Diagnosis not present

## 2018-08-26 DIAGNOSIS — G309 Alzheimer's disease, unspecified: Secondary | ICD-10-CM | POA: Diagnosis not present

## 2018-08-26 DIAGNOSIS — R6889 Other general symptoms and signs: Secondary | ICD-10-CM | POA: Diagnosis not present

## 2018-08-26 DIAGNOSIS — C50411 Malignant neoplasm of upper-outer quadrant of right female breast: Secondary | ICD-10-CM | POA: Diagnosis not present

## 2018-08-26 DIAGNOSIS — Z79899 Other long term (current) drug therapy: Secondary | ICD-10-CM | POA: Diagnosis not present

## 2018-08-26 DIAGNOSIS — I6200 Nontraumatic subdural hemorrhage, unspecified: Secondary | ICD-10-CM | POA: Diagnosis not present

## 2018-08-26 DIAGNOSIS — D519 Vitamin B12 deficiency anemia, unspecified: Secondary | ICD-10-CM | POA: Diagnosis not present

## 2018-08-26 DIAGNOSIS — S065X9D Traumatic subdural hemorrhage with loss of consciousness of unspecified duration, subsequent encounter: Secondary | ICD-10-CM | POA: Diagnosis not present

## 2018-08-26 DIAGNOSIS — R52 Pain, unspecified: Secondary | ICD-10-CM | POA: Diagnosis not present

## 2018-08-26 DIAGNOSIS — F0390 Unspecified dementia without behavioral disturbance: Secondary | ICD-10-CM | POA: Diagnosis not present

## 2018-08-26 DIAGNOSIS — F0391 Unspecified dementia with behavioral disturbance: Secondary | ICD-10-CM | POA: Diagnosis not present

## 2018-08-26 NOTE — Progress Notes (Signed)
PROGRESS NOTE    Jocelyn Mccann  PYK:[id number] DOB: Mar 19, 1928 DOA: 08/21/2018 PCP: Orlena Sheldon, PA-C   Brief Narrative:  Patient is 85 year old female with history of dementia, breast cancer currently on anastrozole who presented from home to the emergency department with a fall and headache.  She was complaining of headache and left knee pain on presentation.  She was hemodynamically stable on presentation.  Brain imaging showed 11 mm subdural hematoma on the right side with no acute neurological deficits.  Neurosurgery consulted after admission.  Repeat CT head on 08/24/18 showed stable changes.  PT/OT recommending skilled nursing facility placement. Patient is medically stable for discharge to skilled nursing facility as soon as the bed is available.  Assessment & Plan:   Active Problems:   SDH (subdural hematoma) (HCC)   Bilateral subdural hematoma: Right more than left with minimal midline shift.  No neurological deficits.  Neurosurgery following.  No need of acute surgical intervention.  Repeat CT head on 08/24/18 showed stable changes. Neurosurgery recommended to follow-up as an outpatient in 1-2 weeks.  Fall: Patient followed by PT/OT.  Recommended skilled nursing facility.  Social Development worker, community followinjg.  Advanced dementia: Lives with family at home.  Continue fall precautions.  Continue supportive care  Hypokalemia: Supplemented and corrected.  History of breast cancer: Currently on remission.  On anastrozole.         DVT prophylaxis: SCD Code Status: Full Family Communication: Called daughter on phone on 08/25/18,call not received Disposition Plan: SNF as soon as bed is available   Consultants: Neurosurgery  Procedures: None  Antimicrobials:  Anti-infectives (From admission, onward)   None      Subjective:  Patient seen and examined the bedside this morning.  Comfortable.  Hemodynamically stable.  Alert, awake but not oriented.  Stable for  discharge Objective: Vitals:   08/26/18 0035 08/26/18 0406 08/26/18 0844 08/26/18 0907  BP: (!) 145/76 (!) 141/88 (!) 158/89 111/86  Pulse: 98 (!) 114 99 (!) 106  Resp: 16 16 17 16   Temp: 98.4 F (36.9 C) 97.7 F (36.5 C) 98.2 F (36.8 C) 99.4 F (37.4 C)  TempSrc: Oral Oral Axillary Oral  SpO2: 100% 99% 99% 100%  Weight:      Height:        Intake/Output Summary (Last 24 hours) at 08/26/2018 1021 Last data filed at 08/26/2018 5397 Gross per 24 hour  Intake -  Output 800 ml  Net -800 ml   Filed Weights   08/21/18 1030  Weight: 81.2 kg    Examination:  General exam: Elderly debilitated female HEENT:PERRL,Oral mucosa moist, Ear/Nose normal on gross exam Respiratory system: Bilateral equal air entry, normal vesicular breath sounds, no wheezes or crackles  Cardiovascular system: S1 & S2 heard, RRR. No JVD, murmurs, rubs, gallops or clicks. Gastrointestinal system: Abdomen is nondistended, soft and nontender. No organomegaly or masses felt. Normal bowel sounds heard. Central nervous system: Alert and awake but not oriented  extremities: No edema, no clubbing ,no cyanosis, distal peripheral pulses palpable. Skin: No rashes, lesions or ulcers,no icterus ,no pallor Psychiatry: Judgement and insight appear impaired    Data Reviewed: I have personally reviewed following labs and imaging studies  CBC: Recent Labs  Lab 08/21/18 1048 08/22/18 0333  WBC 2.6* 2.9*  NEUTROABS 1.6*  --   HGB 12.3 11.6*  HCT 36.7 33.8*  MCV 103.7* 102.1*  PLT 177 673   Basic Metabolic Panel: Recent Labs  Lab 08/21/18 1048 08/22/18 0333  NA 139  139  K 3.5 3.4*  CL 105 103  CO2 25 26  GLUCOSE 141* 104*  BUN 9 12  CREATININE 0.76 0.89  CALCIUM 9.4 9.3   GFR: Estimated Creatinine Clearance: 47.9 mL/min (by C-G formula based on SCr of 0.89 mg/dL). Liver Function Tests: No results for input(s): AST, ALT, ALKPHOS, BILITOT, PROT, ALBUMIN in the last 168 hours. No results for input(s):  LIPASE, AMYLASE in the last 168 hours. No results for input(s): AMMONIA in the last 168 hours. Coagulation Profile: Recent Labs  Lab 08/21/18 1048  INR 1.1   Cardiac Enzymes: No results for input(s): CKTOTAL, CKMB, CKMBINDEX, TROPONINI in the last 168 hours. BNP (last 3 results) No results for input(s): PROBNP in the last 8760 hours. HbA1C: No results for input(s): HGBA1C in the last 72 hours. CBG: Recent Labs  Lab 08/21/18 1033  GLUCAP 124*   Lipid Profile: No results for input(s): CHOL, HDL, LDLCALC, TRIG, CHOLHDL, LDLDIRECT in the last 72 hours. Thyroid Function Tests: No results for input(s): TSH, T4TOTAL, FREET4, T3FREE, THYROIDAB in the last 72 hours. Anemia Panel: No results for input(s): VITAMINB12, FOLATE, FERRITIN, TIBC, IRON, RETICCTPCT in the last 72 hours. Sepsis Labs: No results for input(s): PROCALCITON, LATICACIDVEN in the last 168 hours.  Recent Results (from the past 240 hour(s))  SARS Coronavirus 2 [facility] order, Performed in [facility] hospital lab) Nasopharyngeal Nasopharyngeal Swab     Status: None   Collection Time: 08/21/18  1:47 PM   Specimen: Nasopharyngeal Swab  Result Value Ref Range Status   SARS Coronavirus 2 NEGATIVE NEGATIVE Final    Comment: (NOTE) If result is NEGATIVE SARS-CoV-2 target nucleic acids are NOT DETECTED. The SARS-CoV-2 RNA is generally detectable in upper and lower  respiratory specimens during the acute phase of infection. The lowest  concentration of SARS-CoV-2 viral copies this assay can detect is 250  copies / mL. A negative result does not preclude SARS-CoV-2 infection  and should not be used as the sole basis for treatment or other  patient management decisions.  A negative result may occur with  improper specimen collection / handling, submission of specimen other  than nasopharyngeal swab, presence of viral mutation(s) within the  areas targeted by this assay, and inadequate number of viral copies  (<250 copies  / mL). A negative result must be combined with clinical  observations, patient history, and epidemiological information. If result is POSITIVE SARS-CoV-2 target nucleic acids are DETECTED. The SARS-CoV-2 RNA is generally detectable in upper and lower  respiratory specimens dur ing the acute phase of infection.  Positive  results are indicative of active infection with SARS-CoV-2.  Clinical  correlation with patient history and other diagnostic information is  necessary to determine patient infection status.  Positive results do  not rule out bacterial infection or co-infection with other viruses. If result is PRESUMPTIVE POSTIVE SARS-CoV-2 nucleic acids MAY BE PRESENT.   A presumptive positive result was obtained on the submitted specimen  and confirmed on repeat testing.  While 2019 novel coronavirus  (SARS-CoV-2) nucleic acids may be present in the submitted sample  additional confirmatory testing may be necessary for epidemiological  and / or clinical management purposes  to differentiate between  SARS-CoV-2 and other Sarbecovirus currently known to infect humans.  If clinically indicated additional testing with an alternate test  methodology [id number]) is advised. The SARS-CoV-2 RNA is generally  detectable in upper and lower respiratory sp ecimens during the acute  phase of infection. The expected result is  Negative. Fact Sheet for Patients:  StrictlyIdeas.no Fact Sheet for Healthcare Providers: BankingDealers.co.za This test is not yet approved or cleared by the [place] FDA and has been authorized for detection and/or diagnosis of SARS-CoV-2 by FDA under an Emergency Use Authorization (EUA).  This EUA will remain in effect (meaning this test can be used) for the duration of the COVID-19 declaration under Section 564(b)(1) of the Act, 21 U.S.C. section 360bbb-3(b)(1), unless the authorization is terminated or revoked sooner.  Performed at [facility], [place] [address] [place], [place] [address]   SARS CORONAVIRUS 2 Nasal Swab Aptima Multi Swab     Status: None   Collection Time: 08/25/18 10:14 AM   Specimen: Aptima Multi Swab; Nasal Swab  Result Value Ref Range Status   SARS Coronavirus 2 NEGATIVE NEGATIVE Final    Comment: (NOTE) SARS-CoV-2 target nucleic acids are NOT DETECTED. The SARS-CoV-2 RNA is generally detectable in upper and lower respiratory specimens during the acute phase of infection. Negative results do not preclude SARS-CoV-2 infection, do not rule out co-infections with other pathogens, and should not be used as the sole basis for treatment or other patient management decisions. Negative results must be combined with clinical observations, patient history, and epidemiological information. The expected result is Negative. Fact Sheet for Patients: SugarRoll.be Fact Sheet for Healthcare Providers: https://www.woods-mathews.com/ This test is not yet approved or cleared by the [place] FDA and  has been authorized for detection and/or diagnosis of SARS-CoV-2 by FDA under an Emergency Use Authorization (EUA). This EUA will remain  in effect (meaning this test can be used) for the duration of the COVID-19 declaration under Section 56 4(b)(1) of the Act, 21 U.S.C. section 360bbb-3(b)(1), unless the authorization is terminated or revoked sooner. Performed at [facility], [place] [address], [place], [place] [address]          Radiology Studies: No results found.      Scheduled Meds: . anastrozole  1 mg Oral Daily  . docusate sodium  100 mg Oral BID  . QUEtiapine  25 mg Oral QHS  . sodium chloride flush  3 mL Intravenous Q12H   Continuous Infusions:   LOS: 4 days    Time spent: 25 min. More than 50% of that time was spent in counseling and/or coordination of care.      Shelly Coss, MD [facility] Pager  [phone]  If 7PM-7AM, please contact night-coverage [email or web address] Password [facility] 08/26/2018, 10:21 AM

## 2018-08-26 NOTE — TOC Transition Note (Signed)
Transition of Care [facility]) - CM/SW Discharge Note   Patient Details  Name: Jocelyn Mccann MRN: [id number] Date of Birth: 16-Oct-1928  Transition of Care [facility]) CM/SW Contact:  Gelene Mink, [place] Phone Number: 08/26/2018, 10:25 AM   Clinical Narrative:     Patient will DC to: [facility] Anticipated DC date: 08/26/2018 Family notified: Yes Transport by: Corey Harold   Per MD patient ready for DC to . RN, patient, patient's family, and facility notified of DC. Discharge Summary and FL2 sent to facility. RN to call report prior to discharge [phone]). The patient will go to room 310B. DC packet on chart. Ambulance transport requested for patient.   CSW will sign off for now as social work intervention is no longer needed. Please consult [place] again if new needs arise.  Maki Sweetser, LCSW-A [place]/Clinical Social Work Department Cell: [phone]    Final next level of care: [place] Barriers to Discharge: No Barriers Identified   Patient Goals and CMS Choice Patient states their goals for this hospitalization and ongoing recovery are:: Pt will go to [facility] for rehab CMS Medicare.gov Compare Post Acute Care list provided to:: Patient Choice offered to / list presented to : Patient  Discharge Placement   Existing PASRR number confirmed : 08/23/18          Patient chooses bed at: [place] Patient to be transferred to facility by: [place] Name of family member notified: [name] Patient and family notified of of transfer: 08/26/18  Discharge Plan and Services In-house Referral: Clinical Social Work Discharge Planning Services: [employer] Consult Post Acute Care Choice: [place]          DME Arranged: N/A DME Agency: NA       HH Arranged: NA HH Agency: NA        Social Determinants of Health (SDOH) Interventions     Readmission Risk Interventions No flowsheet data found.

## 2018-08-26 NOTE — Progress Notes (Signed)
Attempted to call report to [facility] in [place] to give report x 3 but unsuccessful. AVS discharge education given to pt daughter at bedside. Pt leaving unit via PTAR.

## 2018-08-27 DIAGNOSIS — S065X9D Traumatic subdural hemorrhage with loss of consciousness of unspecified duration, subsequent encounter: Secondary | ICD-10-CM | POA: Diagnosis not present

## 2018-08-27 DIAGNOSIS — D72819 Decreased white blood cell count, unspecified: Secondary | ICD-10-CM | POA: Diagnosis not present

## 2018-08-27 DIAGNOSIS — M170 Bilateral primary osteoarthritis of knee: Secondary | ICD-10-CM | POA: Diagnosis not present

## 2018-08-27 DIAGNOSIS — C50919 Malignant neoplasm of unspecified site of unspecified female breast: Secondary | ICD-10-CM | POA: Diagnosis not present

## 2018-08-27 DIAGNOSIS — G309 Alzheimer's disease, unspecified: Secondary | ICD-10-CM | POA: Diagnosis not present

## 2018-08-27 DIAGNOSIS — R2681 Unsteadiness on feet: Secondary | ICD-10-CM | POA: Diagnosis not present

## 2018-08-29 ENCOUNTER — Other Ambulatory Visit (HOSPITAL_COMMUNITY): Payer: Self-pay | Admitting: Internal Medicine

## 2018-08-29 ENCOUNTER — Other Ambulatory Visit: Payer: Self-pay | Admitting: Internal Medicine

## 2018-08-29 DIAGNOSIS — M1990 Unspecified osteoarthritis, unspecified site: Secondary | ICD-10-CM

## 2018-09-05 ENCOUNTER — Ambulatory Visit (HOSPITAL_COMMUNITY)
Admission: RE | Admit: 2018-09-05 | Discharge: 2018-09-05 | Disposition: A | Discharge status: Home / Self Care | Payer: Medicare HMO | Source: Ambulatory Visit | Attending: Internal Medicine | Admitting: Internal Medicine

## 2018-09-05 ENCOUNTER — Other Ambulatory Visit: Payer: Self-pay | Admitting: Neurosurgery

## 2018-09-05 ENCOUNTER — Other Ambulatory Visit (HOSPITAL_COMMUNITY): Payer: Self-pay | Admitting: Internal Medicine

## 2018-09-05 ENCOUNTER — Other Ambulatory Visit: Payer: Self-pay

## 2018-09-05 DIAGNOSIS — M170 Bilateral primary osteoarthritis of knee: Secondary | ICD-10-CM | POA: Insufficient documentation

## 2018-09-05 DIAGNOSIS — M1990 Unspecified osteoarthritis, unspecified site: Secondary | ICD-10-CM

## 2018-09-05 DIAGNOSIS — S065X9A Traumatic subdural hemorrhage with loss of consciousness of unspecified duration, initial encounter: Secondary | ICD-10-CM

## 2018-09-05 DIAGNOSIS — S065XAA Traumatic subdural hemorrhage with loss of consciousness status unknown, initial encounter: Secondary | ICD-10-CM

## 2018-09-05 MED ORDER — METHYLPREDNISOLONE ACETATE 80 MG/ML IJ SUSP
INTRAMUSCULAR | Status: AC
  Filled 2018-09-05: qty 1

## 2018-09-05 MED ORDER — SODIUM CHLORIDE (PF) 0.9 % IJ SOLN
INTRAMUSCULAR | Status: AC
  Filled 2018-09-05: qty 10

## 2018-09-05 MED ORDER — LIDOCAINE HCL (PF) 1 % IJ SOLN
INTRAMUSCULAR | Status: AC
  Filled 2018-09-05: qty 10

## 2018-09-05 MED ORDER — METHYLPREDNISOLONE ACETATE 40 MG/ML IJ SUSP
INTRAMUSCULAR | Status: AC
  Filled 2018-09-05: qty 1

## 2018-09-05 MED ORDER — BUPIVACAINE HCL (PF) 0.25 % IJ SOLN
30.0000 mL | Freq: Once | INTRAMUSCULAR | Status: AC
  Administered 2018-09-05: 10 mL

## 2018-09-05 MED ORDER — BUPIVACAINE HCL (PF) 0.25 % IJ SOLN
INTRAMUSCULAR | Status: AC
  Administered 2018-09-05: 10 mL via INTRAMUSCULAR
  Filled 2018-09-05: qty 30

## 2018-09-05 MED ORDER — METHYLPREDNISOLONE ACETATE 40 MG/ML INJ SUSP (RADIOLOG
120.0000 mg | Freq: Once | INTRAMUSCULAR | Status: AC
  Administered 2018-09-05: 120 mg via INTRA_ARTICULAR

## 2018-09-05 MED ORDER — LIDOCAINE HCL (PF) 1 % IJ SOLN
10.0000 mL | Freq: Once | INTRAMUSCULAR | Status: AC
  Administered 2018-09-05: 10 mL via INTRADERMAL

## 2018-09-05 NOTE — Procedures (Signed)
  Procedure: Bilat knee injection under fluoro 120mg  depomedrol in 37ml bupivicaine 0.25% divided between 2 sides EBL:   minimal Complications:  none immediate  See full dictation in [facility].  Dillard Cannon MD Main # [phone] Pager  [phone]

## 2018-09-12 ENCOUNTER — Other Ambulatory Visit: Payer: Self-pay

## 2018-09-12 ENCOUNTER — Ambulatory Visit
Admission: RE | Admit: 2018-09-12 | Discharge: 2018-09-12 | Disposition: A | Discharge status: Home / Self Care | Payer: Medicare HMO | Source: Ambulatory Visit | Attending: Neurosurgery | Admitting: Neurosurgery

## 2018-09-12 DIAGNOSIS — S065X9A Traumatic subdural hemorrhage with loss of consciousness of unspecified duration, initial encounter: Secondary | ICD-10-CM

## 2018-09-12 DIAGNOSIS — I6200 Nontraumatic subdural hemorrhage, unspecified: Secondary | ICD-10-CM | POA: Diagnosis not present

## 2018-09-12 DIAGNOSIS — S065XAA Traumatic subdural hemorrhage with loss of consciousness status unknown, initial encounter: Secondary | ICD-10-CM

## 2018-09-13 DIAGNOSIS — F0391 Unspecified dementia with behavioral disturbance: Secondary | ICD-10-CM | POA: Diagnosis not present

## 2018-09-13 DIAGNOSIS — R319 Hematuria, unspecified: Secondary | ICD-10-CM | POA: Diagnosis not present

## 2018-09-13 DIAGNOSIS — F29 Unspecified psychosis not due to a substance or known physiological condition: Secondary | ICD-10-CM | POA: Diagnosis not present

## 2018-09-13 DIAGNOSIS — G309 Alzheimer's disease, unspecified: Secondary | ICD-10-CM | POA: Diagnosis not present

## 2018-09-13 DIAGNOSIS — F419 Anxiety disorder, unspecified: Secondary | ICD-10-CM | POA: Diagnosis not present

## 2018-09-13 DIAGNOSIS — R6889 Other general symptoms and signs: Secondary | ICD-10-CM | POA: Diagnosis not present

## 2018-09-13 DIAGNOSIS — N390 Urinary tract infection, site not specified: Secondary | ICD-10-CM | POA: Diagnosis not present

## 2018-09-15 DIAGNOSIS — S065X9A Traumatic subdural hemorrhage with loss of consciousness of unspecified duration, initial encounter: Secondary | ICD-10-CM | POA: Diagnosis not present

## 2018-09-21 ENCOUNTER — Other Ambulatory Visit: Payer: Self-pay | Admitting: Oncology

## 2018-09-21 ENCOUNTER — Other Ambulatory Visit: Payer: Self-pay | Admitting: *Deleted

## 2018-09-21 ENCOUNTER — Encounter: Payer: Self-pay | Admitting: *Deleted

## 2018-09-21 DIAGNOSIS — F0391 Unspecified dementia with behavioral disturbance: Secondary | ICD-10-CM

## 2018-09-21 NOTE — Patient Outreach (Addendum)
Referral received from [facility], pt discharged from [facility] skilled nursing facility 09/17/18 after a stay for traumatic subdural hemorrhage with loss of consciousness, pt with diagnosis mild dementia, breast cancer, Bil knee pain, GAD, urge incontinence.  Outreach call to patient's daughter HCPOA [name] (due to dementia will speak with daughter) for transition of care/ screening, per daughter, pt did see primary care MD at [facility] but no longer sees anyone there and Dr. Lurline Del (oncology) is primary care MD.  Pt lives with daughter and son in law, per daughter, pt has had mild dementia for about 5 years but can get combative at times, does not see specialist for dementia but daughter may check into and wants to discuss with Dr. Jana Hakim, daughter feels pt has some depression and " has always had depression her entire life but refused to take medications in the past"  Pt does not walk much, is reclusive, and resistant to being told what to do regarding her health (such as getting toenails trimmed, etc)  Daughter states pt fell twice while nursing facility but "did not fall with subdural hemorrhage at home, she was sitting on commode and slumped over"  Daughter feels dementia is the biggest concern at present with falls being second. RN CM reviewed medications.  RN CM emailed EMMI educational materials to daughter's email related to dementia and falls.  RN CM faxed today's visit note to primary MD.  Outpatient Encounter Medications as of 09/21/2018  Medication Sig  . anastrozole (ARIMIDEX) 1 MG tablet TAKE 1 TABLET BY MOUTH EVERY DAY (Patient taking differently: Take 1 mg by mouth daily. )  . cholecalciferol (VITAMIN D3) 25 MCG (1000 UT) tablet Take 1 tablet (1,000 Units total) by mouth daily.  . divalproex (DEPAKOTE) 125 MG DR tablet Take 125 mg by mouth 2 (two) times daily. Pt to take 1/2 tablet  (1/2 of 125 mg) once daily  . QUEtiapine (SEROQUEL) 25 MG tablet  Take 1 tablet (25 mg total) by mouth at bedtime.  . sertraline (ZOLOFT) 25 MG tablet Take 25 mg by mouth daily.  . traMADol (ULTRAM) 50 MG tablet Take 1 tablet (50 mg total) by mouth every 6 (six) hours as needed for moderate pain.   No facility-administered encounter medications on file as of 09/21/2018.    THN CM Care Plan Problem One     Most Recent Value  Care Plan Problem One  Decreased quality of life related to dementia  Role Documenting the Problem One  Care Management Coordinator  Care Plan for Problem One  Active  THN Long Term Goal   Pt, CG will demonstrate/ verbalize improved self care related to dementia within 60 days  THN Long Term Goal Start Date  09/21/18  Interventions for Problem One Long Term Goal  RN CM established plan of care with patient's daughter HCPOA, mailed successful outreach letter to pt home with 24 hour nurse line magnet, pamphlet, consent form  THN CM Short Term Goal #1   CG will review EMMI information and verbalize increased understanding of dementia within 30 days  THN CM Short Term Goal #1 Start Date  09/21/18  Interventions for Short Term Goal #1  RN CM emailed EMMI education related to dementia to daughter's email address, placed order for CSW for additional resources, support    [facility] CM Care Plan Problem Two     Most Recent Value  Care Plan Problem Two  High risk for falls  Role Documenting the Problem  Two  Care Management Coordinator  Care Plan for Problem Two  Active  THN CM Short Term Goal #1   CG will verbalize safety precautions within 30 days  THN CM Short Term Goal #1 Start Date  09/21/18  Interventions for Short Term Goal #2   RN CM reviewed safety precautions with daughter, encouraged to use DME and assist pt as needed, work with home health PT and do exercises daily      PLAN Outreach weekly for transition of care Collaborate with CSW as needed  [name] [facility], Person Coordinator [phone]

## 2018-09-22 ENCOUNTER — Other Ambulatory Visit: Payer: Self-pay | Admitting: Oncology

## 2018-09-25 ENCOUNTER — Other Ambulatory Visit: Payer: Self-pay

## 2018-09-25 NOTE — Patient Outreach (Signed)
[place] [facility]) Care Management  09/25/2018  KATALEA RAJCHEL 22-Nov-1928 [id number]   Social work referral received from [facility], [name].  "Order for social work for additional support, resources dementia (pt has mild dementia but is combative with CG (daughter) at times) Lives [place] and son in [place]. Daughter has some CG stress." Successful outreach to daughter today but she requested call back tomorrow morning.  Ronn Melena, BSW Social Worker [phone]

## 2018-09-26 ENCOUNTER — Other Ambulatory Visit: Payer: Self-pay

## 2018-09-26 ENCOUNTER — Ambulatory Visit: Payer: Self-pay

## 2018-09-26 DIAGNOSIS — R443 Hallucinations, unspecified: Secondary | ICD-10-CM | POA: Diagnosis not present

## 2018-09-26 DIAGNOSIS — F0281 Dementia in other diseases classified elsewhere with behavioral disturbance: Secondary | ICD-10-CM | POA: Diagnosis not present

## 2018-09-26 DIAGNOSIS — C50912 Malignant neoplasm of unspecified site of left female breast: Secondary | ICD-10-CM | POA: Diagnosis not present

## 2018-09-26 DIAGNOSIS — S065X9A Traumatic subdural hemorrhage with loss of consciousness of unspecified duration, initial encounter: Secondary | ICD-10-CM | POA: Diagnosis not present

## 2018-09-26 DIAGNOSIS — I6523 Occlusion and stenosis of bilateral carotid arteries: Secondary | ICD-10-CM | POA: Diagnosis not present

## 2018-09-26 DIAGNOSIS — G309 Alzheimer's disease, unspecified: Secondary | ICD-10-CM | POA: Diagnosis not present

## 2018-09-26 DIAGNOSIS — R296 Repeated falls: Secondary | ICD-10-CM | POA: Diagnosis not present

## 2018-09-26 DIAGNOSIS — E876 Hypokalemia: Secondary | ICD-10-CM | POA: Diagnosis not present

## 2018-09-26 DIAGNOSIS — M170 Bilateral primary osteoarthritis of knee: Secondary | ICD-10-CM | POA: Diagnosis not present

## 2018-09-26 NOTE — Patient Outreach (Signed)
[place] [facility]) Care Management  09/26/2018  Jocelyn Mccann 05/27/1928 [id number]   Social work referral received from [facility], [name].  "Order for social work for additional support, resources dementia (pt has mild dementia but is combative with CG (daughter) at times) Lives [place] daughter and son in [place]. Daughter has some CG stress." Spoke with daughter briefly yesterday but she requested call back today.  Unsuccessful outreach today.  Voicemail box full on mobile number.  Left message on home number.  Will attempt to reach again within four business days.  Ronn Melena, BSW Social Worker [phone]

## 2018-09-27 ENCOUNTER — Telehealth: Payer: Self-pay | Admitting: Oncology

## 2018-09-27 ENCOUNTER — Telehealth: Payer: Self-pay

## 2018-09-27 NOTE — Telephone Encounter (Signed)
Spoke with pt's dtr, [name], and gave appt date, time for 9/24

## 2018-09-27 NOTE — Telephone Encounter (Signed)
Called daughter per 9/15 sch message- no answer - and no vmail (full) sent message to RN to let them know pt was not able to be reached .

## 2018-09-28 ENCOUNTER — Other Ambulatory Visit: Payer: Self-pay | Admitting: *Deleted

## 2018-09-28 NOTE — Patient Outreach (Signed)
Transition of care call #2. Spoke with pt's daughter, [name]. She says things just seem to be getting more difficult. Her mother has become more combative and is refusing to bath. She also does not like wearing the incontinent briefs and when they are soiled she hides them. She has had to put a bell on the door so she knows if her mother is moving about. She also says she is very depressed. It is very difficult to get her mother to take her medicatons. She is trying to crush them and hide them in her food.  She has not yet received the education materials that [name], RN, has sent her.  She is expecting the home health nurse and SW to come tomorrow.  Suggested she and her husband to leave her alone if she is combative. Try distraction to get her mind off the present task and try later. Do a task together, for example, lets take a wash cloth bath MOM, you wash yourself and I will wash myself (mirror image).  Discussed LTC placement, memory care unit will be most appropriate. Encouraged them to start looking and gave information on costs and help from Medicaid.  Advised [name], RN, will be checking on them next week.  Eulah Pont. Myrtie Neither, MSN, [facility] Gerontological Nurse Practitioner [facility] Care Management [phone]

## 2018-09-29 ENCOUNTER — Other Ambulatory Visit: Payer: Self-pay

## 2018-09-29 DIAGNOSIS — F0281 Dementia in other diseases classified elsewhere with behavioral disturbance: Secondary | ICD-10-CM | POA: Diagnosis not present

## 2018-09-29 DIAGNOSIS — R443 Hallucinations, unspecified: Secondary | ICD-10-CM | POA: Diagnosis not present

## 2018-09-29 DIAGNOSIS — C50912 Malignant neoplasm of unspecified site of left female breast: Secondary | ICD-10-CM | POA: Diagnosis not present

## 2018-09-29 DIAGNOSIS — E876 Hypokalemia: Secondary | ICD-10-CM | POA: Diagnosis not present

## 2018-09-29 DIAGNOSIS — M170 Bilateral primary osteoarthritis of knee: Secondary | ICD-10-CM | POA: Diagnosis not present

## 2018-09-29 DIAGNOSIS — S065X9A Traumatic subdural hemorrhage with loss of consciousness of unspecified duration, initial encounter: Secondary | ICD-10-CM | POA: Diagnosis not present

## 2018-09-29 DIAGNOSIS — R296 Repeated falls: Secondary | ICD-10-CM | POA: Diagnosis not present

## 2018-09-29 DIAGNOSIS — G309 Alzheimer's disease, unspecified: Secondary | ICD-10-CM | POA: Diagnosis not present

## 2018-09-29 DIAGNOSIS — I6523 Occlusion and stenosis of bilateral carotid arteries: Secondary | ICD-10-CM | POA: Diagnosis not present

## 2018-09-29 NOTE — Patient Outreach (Signed)
[place] [facility]) Care Management  09/29/2018  Jocelyn Mccann 12-04-1928 [id number]   Social work referral received from [facility], [name]. "Order for social work for additional support, resources dementia (pt has mild dementia but is combative with CG (daughter) at times) Lives [place] and son in [place]. Daughter has some CG stress." Was able to connect with daughter today, however, [facility] staff showed up for assessment just as we started conversation.  Requested that daughter get contact information for [employer] if possible so that we can collaborate on patient's care.  Daughter said she will call me back; will reach out to her again next week if no return call.  Ronn Melena, BSW Social Worker [phone]

## 2018-09-30 DIAGNOSIS — S065X9A Traumatic subdural hemorrhage with loss of consciousness of unspecified duration, initial encounter: Secondary | ICD-10-CM | POA: Diagnosis not present

## 2018-09-30 DIAGNOSIS — I6523 Occlusion and stenosis of bilateral carotid arteries: Secondary | ICD-10-CM | POA: Diagnosis not present

## 2018-09-30 DIAGNOSIS — R443 Hallucinations, unspecified: Secondary | ICD-10-CM | POA: Diagnosis not present

## 2018-09-30 DIAGNOSIS — E876 Hypokalemia: Secondary | ICD-10-CM | POA: Diagnosis not present

## 2018-09-30 DIAGNOSIS — C50912 Malignant neoplasm of unspecified site of left female breast: Secondary | ICD-10-CM | POA: Diagnosis not present

## 2018-09-30 DIAGNOSIS — M170 Bilateral primary osteoarthritis of knee: Secondary | ICD-10-CM | POA: Diagnosis not present

## 2018-09-30 DIAGNOSIS — G309 Alzheimer's disease, unspecified: Secondary | ICD-10-CM | POA: Diagnosis not present

## 2018-09-30 DIAGNOSIS — F0281 Dementia in other diseases classified elsewhere with behavioral disturbance: Secondary | ICD-10-CM | POA: Diagnosis not present

## 2018-09-30 DIAGNOSIS — R296 Repeated falls: Secondary | ICD-10-CM | POA: Diagnosis not present

## 2018-10-02 ENCOUNTER — Other Ambulatory Visit: Payer: Self-pay

## 2018-10-02 NOTE — Patient Outreach (Signed)
[facility]) Care Management  10/02/2018  Jocelyn Mccann 05-23-1928 [id number]   Successful outreach to daughter today regarding social work referral.  Original referral stated "Order for social work for additional support, resources dementia (pt has mild dementia but is combative with CG (daughter) at times) Lives [place] and son [place]. Daughter has some CG stress." Patient is receiving [place] services with Jocelyn Mccann.  [facility] Social Worker, Jocelyn Mccann, had home visit with patient's daughter on 09/29/18.  Daughter and Education officer, museum discussed various levels of care including in-home services and memory care.  She was informed by Education officer, museum that in-home aide services are only covered by Medicare if in conjunction with skilled service.  Daughter was provided with multiple caregiver resources.   Contacted Jocelyn Mccann after call with daughter to collaborate.   Jocelyn Mccann confirmed multiple resources provided.  Based on conversation during home visit, patient is over the income limit to qualify for Medicaid so she did have conversation with daughter about what is and is not covered by insurance.  Jocelyn Mccann stated that she plans to follow up with daughter via home visit or phone. Told daughter that I would send her a list of memory care facilities in the area in case the family decides to proceed with placement. Will follow up within the next two weeks to ensure receipt and to follow up on resources provided.   Jocelyn Mccann, BSW Social Worker [phone]

## 2018-10-03 DIAGNOSIS — R443 Hallucinations, unspecified: Secondary | ICD-10-CM | POA: Diagnosis not present

## 2018-10-03 DIAGNOSIS — G309 Alzheimer's disease, unspecified: Secondary | ICD-10-CM | POA: Diagnosis not present

## 2018-10-03 DIAGNOSIS — C50912 Malignant neoplasm of unspecified site of left female breast: Secondary | ICD-10-CM | POA: Diagnosis not present

## 2018-10-03 DIAGNOSIS — M170 Bilateral primary osteoarthritis of knee: Secondary | ICD-10-CM | POA: Diagnosis not present

## 2018-10-03 DIAGNOSIS — E876 Hypokalemia: Secondary | ICD-10-CM | POA: Diagnosis not present

## 2018-10-03 DIAGNOSIS — I6523 Occlusion and stenosis of bilateral carotid arteries: Secondary | ICD-10-CM | POA: Diagnosis not present

## 2018-10-03 DIAGNOSIS — F0281 Dementia in other diseases classified elsewhere with behavioral disturbance: Secondary | ICD-10-CM | POA: Diagnosis not present

## 2018-10-03 DIAGNOSIS — R296 Repeated falls: Secondary | ICD-10-CM | POA: Diagnosis not present

## 2018-10-03 DIAGNOSIS — S065X9A Traumatic subdural hemorrhage with loss of consciousness of unspecified duration, initial encounter: Secondary | ICD-10-CM | POA: Diagnosis not present

## 2018-10-04 ENCOUNTER — Telehealth: Payer: Self-pay | Admitting: *Deleted

## 2018-10-04 NOTE — Telephone Encounter (Signed)
This RN spoke with pt's daughter- [name] - per request of Previsit intake call with dtr stating concerns and need for in person visit.  [name] states she feels due to recent changes and concerns including increased dementia and falls - pt needs to see MD for better evaluation and plan.  " our last visit was by phone and since then there have been a lot of changes including a fall that resulted in a sub dural hematoma "  " just concerned and would prefer in person visit "  Note [name] was a bit frustrated per prior call due to - she received other calls stating appointment and discussion of valet parking - all that she " know one said it was a phone visit until today- why would they tell me about valet parking if it is a phone call ?"  This RN apologized for multiple calls and mis information. Informed her - we are happy to see her mother in person especially due to noted concerns.  This RN made note for [name] to accompany pt for safety and communication concerns.

## 2018-10-05 ENCOUNTER — Telehealth: Payer: Self-pay | Admitting: *Deleted

## 2018-10-05 ENCOUNTER — Other Ambulatory Visit: Payer: Self-pay | Admitting: *Deleted

## 2018-10-05 ENCOUNTER — Inpatient Hospital Stay: Payer: Medicare HMO | Attending: Oncology | Admitting: Oncology

## 2018-10-05 ENCOUNTER — Encounter: Payer: Self-pay | Admitting: Oncology

## 2018-10-05 DIAGNOSIS — C50411 Malignant neoplasm of upper-outer quadrant of right female breast: Secondary | ICD-10-CM

## 2018-10-05 DIAGNOSIS — Z170 Estrogen receptor positive status [ER+]: Secondary | ICD-10-CM

## 2018-10-05 NOTE — Progress Notes (Signed)
Jocelyn Mccann was scheduled to see me today but her daughter tells me she simply refused to come in "I cannot make her".  The plan is to continue anastrozole.  I will reschedule her for sometime next year hopefully after the pandemic has resolved

## 2018-10-05 NOTE — Patient Outreach (Signed)
[facility]) Care Management  10/05/2018  EL VANWINGERDEN 21-Jan-1928 [id number]  Transition of care call   Successful outreach call to patient daughter , [name]., introduced self and purpose of the call.  She states patient has some good days and some more difficult days with trying to help her with taking a bath. She reports yesterday being a spectacular day, but today is she is not cooperative.  Daughter reports cancelling in office  appointment with Oncology/PCP due to patient behavior she is planning to reschedule she prefers patient to have an in office visit if possible she will discuss again with staff.   Daughter discussed patient is cooperative when home health RN, participates with therapy , she states that she usually just leaves the room when home health arrives and allow them to work with patient . Suggested to attempt bath on those days that patient is more cooperative.  She denies patient having recurrent fall. She anticipates home PT visit on tomorrow.  She has called patient family out of state to try and speak with patient and she needs to hang up at this time.  She did verify receiving education material sent on Dementia and she has started to read it .    Plan Discussed with caregiver that [name] will follow up in the next week.    Joylene Draft, RN, [place] Management Coordinator  [phone]- Mobile [phone]- Toll Free Main Office

## 2018-10-05 NOTE — Telephone Encounter (Signed)
VM left by pt's daughter - [name]-stating " mom has dementia and has been very combative today and I cannot force her to come "  Return call number given as [phone].  This RN called [name] and discussed above. She states it is " luck of the draw " regarding her mother's dementia and how cooperative she will be.  [name] is very concerned due to pt's recent fall and subdural hematoma " and Dr Jana Hakim being her primary MD needs to see her " " and he needs to check on her breast "  This RN discussed above further explaining Dr Jannifer Rodney is her mother's primary oncologist- and pt may need to be seen an MD who specializes in [place] and dementia.  [name] stated " oh I wish Dr Jana Hakim would be her primary because he was able to speak with her in her language and got her to open up more then any doctor"  This RN validated her concerns- above situation will be given to MD for review and appropriate follow up.

## 2018-10-06 ENCOUNTER — Telehealth: Payer: Self-pay | Admitting: *Deleted

## 2018-10-06 DIAGNOSIS — R296 Repeated falls: Secondary | ICD-10-CM | POA: Diagnosis not present

## 2018-10-06 DIAGNOSIS — C50912 Malignant neoplasm of unspecified site of left female breast: Secondary | ICD-10-CM | POA: Diagnosis not present

## 2018-10-06 DIAGNOSIS — E876 Hypokalemia: Secondary | ICD-10-CM | POA: Diagnosis not present

## 2018-10-06 DIAGNOSIS — F0281 Dementia in other diseases classified elsewhere with behavioral disturbance: Secondary | ICD-10-CM | POA: Diagnosis not present

## 2018-10-06 DIAGNOSIS — R443 Hallucinations, unspecified: Secondary | ICD-10-CM | POA: Diagnosis not present

## 2018-10-06 DIAGNOSIS — G309 Alzheimer's disease, unspecified: Secondary | ICD-10-CM | POA: Diagnosis not present

## 2018-10-06 DIAGNOSIS — M170 Bilateral primary osteoarthritis of knee: Secondary | ICD-10-CM | POA: Diagnosis not present

## 2018-10-06 DIAGNOSIS — S065X9A Traumatic subdural hemorrhage with loss of consciousness of unspecified duration, initial encounter: Secondary | ICD-10-CM | POA: Diagnosis not present

## 2018-10-06 DIAGNOSIS — I6523 Occlusion and stenosis of bilateral carotid arteries: Secondary | ICD-10-CM | POA: Diagnosis not present

## 2018-10-06 MED ORDER — CIPROFLOXACIN HCL 500 MG PO TABS: 500.0000 mg | ORAL_TABLET | Freq: Every day | ORAL | 0 refills | Status: DC

## 2018-10-06 NOTE — Telephone Encounter (Signed)
This RN received VM from Mel PT with [facility]- who states per his visit today - pt is showing signs of UTI and is inquiring about getting an order for U/A.  Return call number given as [phone].  Returned call and discussed above- noting that Dr Jannifer Rodney is not covering her home care. Per Mel - pt was discharged from SNF with home care orders but not a primary MD named for other orders.  Presently Dr Jannifer Rodney is the only doctor the patient is seeing - daughter states Dr Jana Hakim is primary.  Per review with MD - prescription given for empiric coverage for UTI due to lateness in day and concern report will not be available for MD review prior to office closing.  This RN informed Mel of above and daughter - [name].  Note this RN reiterated need to get pt established with a primary MD for appropriate care.  Note appointment for follow up is being rescheduled with plan to coordinate date and time for when Mel is in home due "mom listens to what he says and does it "  [name] will call this RN with above for coordination of care.  Pharmacy verified and prescription sent per MD.

## 2018-10-07 DIAGNOSIS — M170 Bilateral primary osteoarthritis of knee: Secondary | ICD-10-CM | POA: Diagnosis not present

## 2018-10-07 DIAGNOSIS — C50912 Malignant neoplasm of unspecified site of left female breast: Secondary | ICD-10-CM | POA: Diagnosis not present

## 2018-10-07 DIAGNOSIS — I6523 Occlusion and stenosis of bilateral carotid arteries: Secondary | ICD-10-CM | POA: Diagnosis not present

## 2018-10-07 DIAGNOSIS — R296 Repeated falls: Secondary | ICD-10-CM | POA: Diagnosis not present

## 2018-10-07 DIAGNOSIS — S065X9A Traumatic subdural hemorrhage with loss of consciousness of unspecified duration, initial encounter: Secondary | ICD-10-CM | POA: Diagnosis not present

## 2018-10-07 DIAGNOSIS — R443 Hallucinations, unspecified: Secondary | ICD-10-CM | POA: Diagnosis not present

## 2018-10-07 DIAGNOSIS — G309 Alzheimer's disease, unspecified: Secondary | ICD-10-CM | POA: Diagnosis not present

## 2018-10-07 DIAGNOSIS — F0281 Dementia in other diseases classified elsewhere with behavioral disturbance: Secondary | ICD-10-CM | POA: Diagnosis not present

## 2018-10-07 DIAGNOSIS — E876 Hypokalemia: Secondary | ICD-10-CM | POA: Diagnosis not present

## 2018-10-10 DIAGNOSIS — E876 Hypokalemia: Secondary | ICD-10-CM | POA: Diagnosis not present

## 2018-10-10 DIAGNOSIS — F0281 Dementia in other diseases classified elsewhere with behavioral disturbance: Secondary | ICD-10-CM | POA: Diagnosis not present

## 2018-10-10 DIAGNOSIS — M170 Bilateral primary osteoarthritis of knee: Secondary | ICD-10-CM | POA: Diagnosis not present

## 2018-10-10 DIAGNOSIS — S065X9A Traumatic subdural hemorrhage with loss of consciousness of unspecified duration, initial encounter: Secondary | ICD-10-CM | POA: Diagnosis not present

## 2018-10-10 DIAGNOSIS — C50912 Malignant neoplasm of unspecified site of left female breast: Secondary | ICD-10-CM | POA: Diagnosis not present

## 2018-10-10 DIAGNOSIS — R296 Repeated falls: Secondary | ICD-10-CM | POA: Diagnosis not present

## 2018-10-10 DIAGNOSIS — R443 Hallucinations, unspecified: Secondary | ICD-10-CM | POA: Diagnosis not present

## 2018-10-10 DIAGNOSIS — G309 Alzheimer's disease, unspecified: Secondary | ICD-10-CM | POA: Diagnosis not present

## 2018-10-10 DIAGNOSIS — I6523 Occlusion and stenosis of bilateral carotid arteries: Secondary | ICD-10-CM | POA: Diagnosis not present

## 2018-10-11 DIAGNOSIS — R296 Repeated falls: Secondary | ICD-10-CM | POA: Diagnosis not present

## 2018-10-11 DIAGNOSIS — C50912 Malignant neoplasm of unspecified site of left female breast: Secondary | ICD-10-CM | POA: Diagnosis not present

## 2018-10-11 DIAGNOSIS — I6523 Occlusion and stenosis of bilateral carotid arteries: Secondary | ICD-10-CM | POA: Diagnosis not present

## 2018-10-11 DIAGNOSIS — S065X9A Traumatic subdural hemorrhage with loss of consciousness of unspecified duration, initial encounter: Secondary | ICD-10-CM | POA: Diagnosis not present

## 2018-10-11 DIAGNOSIS — R443 Hallucinations, unspecified: Secondary | ICD-10-CM | POA: Diagnosis not present

## 2018-10-11 DIAGNOSIS — E876 Hypokalemia: Secondary | ICD-10-CM | POA: Diagnosis not present

## 2018-10-11 DIAGNOSIS — F0281 Dementia in other diseases classified elsewhere with behavioral disturbance: Secondary | ICD-10-CM | POA: Diagnosis not present

## 2018-10-11 DIAGNOSIS — G309 Alzheimer's disease, unspecified: Secondary | ICD-10-CM | POA: Diagnosis not present

## 2018-10-11 DIAGNOSIS — M170 Bilateral primary osteoarthritis of knee: Secondary | ICD-10-CM | POA: Diagnosis not present

## 2018-10-12 ENCOUNTER — Other Ambulatory Visit: Payer: Self-pay | Admitting: *Deleted

## 2018-10-12 NOTE — Patient Outreach (Signed)
Outreach call to patient's daughter Jocelyn Mccann for transition of care week 4, spoke with Mrs. Jocelyn Mccann who reports she is on the way out the door and requests call back next week.  PLAN Outreach pt daughter next week for TOC week [facility], [place] Coordinator [phone]

## 2018-10-17 ENCOUNTER — Other Ambulatory Visit: Payer: Self-pay

## 2018-10-17 DIAGNOSIS — M170 Bilateral primary osteoarthritis of knee: Secondary | ICD-10-CM | POA: Diagnosis not present

## 2018-10-17 DIAGNOSIS — G309 Alzheimer's disease, unspecified: Secondary | ICD-10-CM | POA: Diagnosis not present

## 2018-10-17 DIAGNOSIS — E876 Hypokalemia: Secondary | ICD-10-CM | POA: Diagnosis not present

## 2018-10-17 DIAGNOSIS — S065X9A Traumatic subdural hemorrhage with loss of consciousness of unspecified duration, initial encounter: Secondary | ICD-10-CM | POA: Diagnosis not present

## 2018-10-17 DIAGNOSIS — R296 Repeated falls: Secondary | ICD-10-CM | POA: Diagnosis not present

## 2018-10-17 DIAGNOSIS — C50912 Malignant neoplasm of unspecified site of left female breast: Secondary | ICD-10-CM | POA: Diagnosis not present

## 2018-10-17 DIAGNOSIS — R443 Hallucinations, unspecified: Secondary | ICD-10-CM | POA: Diagnosis not present

## 2018-10-17 DIAGNOSIS — I6523 Occlusion and stenosis of bilateral carotid arteries: Secondary | ICD-10-CM | POA: Diagnosis not present

## 2018-10-17 DIAGNOSIS — F0281 Dementia in other diseases classified elsewhere with behavioral disturbance: Secondary | ICD-10-CM | POA: Diagnosis not present

## 2018-10-17 NOTE — Patient Outreach (Signed)
[place] [facility]) Care Management  10/17/2018  Jocelyn Mccann [birth year] [id number]   Attempted to follow up with patient's daughter today regarding social work referral.  [name] staff were just showing up during first outreach to she requested a call back.  Called back but she was assisting patient with shower.  Will call again tomorrow.  Ronn Melena, BSW Social Worker [phone]

## 2018-10-18 ENCOUNTER — Other Ambulatory Visit: Payer: Self-pay

## 2018-10-18 DIAGNOSIS — G309 Alzheimer's disease, unspecified: Secondary | ICD-10-CM | POA: Diagnosis not present

## 2018-10-18 DIAGNOSIS — I6523 Occlusion and stenosis of bilateral carotid arteries: Secondary | ICD-10-CM | POA: Diagnosis not present

## 2018-10-18 DIAGNOSIS — R443 Hallucinations, unspecified: Secondary | ICD-10-CM | POA: Diagnosis not present

## 2018-10-18 DIAGNOSIS — E876 Hypokalemia: Secondary | ICD-10-CM | POA: Diagnosis not present

## 2018-10-18 DIAGNOSIS — R296 Repeated falls: Secondary | ICD-10-CM | POA: Diagnosis not present

## 2018-10-18 DIAGNOSIS — C50912 Malignant neoplasm of unspecified site of left female breast: Secondary | ICD-10-CM | POA: Diagnosis not present

## 2018-10-18 DIAGNOSIS — F0281 Dementia in other diseases classified elsewhere with behavioral disturbance: Secondary | ICD-10-CM | POA: Diagnosis not present

## 2018-10-18 DIAGNOSIS — M170 Bilateral primary osteoarthritis of knee: Secondary | ICD-10-CM | POA: Diagnosis not present

## 2018-10-18 DIAGNOSIS — S065X9A Traumatic subdural hemorrhage with loss of consciousness of unspecified duration, initial encounter: Secondary | ICD-10-CM | POA: Diagnosis not present

## 2018-10-18 NOTE — Patient Outreach (Signed)
[facility]) Care Management  10/18/2018  Jocelyn Mccann 03-20-1928 [id number]   Successful follow up call to patient's daughter. She did receive list of Memory Care Facilities that was mailed to her on 10/02/18.   Collaborated with Jackelyn Poling, [facility] Social Worker, after previous outreach to daughter.  Debbie confirmed multiple resources provided during initial home visit.  Based on conversation during that home visit, patient is over the income limit to qualify for Medicaid so Jackelyn Poling did have conversation with daughter about what is and is not covered by patient's insurance.  Daughter reported that Jackelyn Poling recently conducted a follow up visit. Closing social work case at this time but did encourage daughter to call if additional needs arise.  Ronn Melena, BSW Social Worker [phone]

## 2018-10-20 ENCOUNTER — Other Ambulatory Visit: Payer: Self-pay | Admitting: *Deleted

## 2018-10-20 NOTE — Patient Outreach (Signed)
Outreach call to patient's daughter for transition of care week 4, patient's son in law answered the phone stating patient's daughter is not at home and he will have her to call RN CM, contact number given.  PLAN Will await call back from patient's daughter Or outreach in 3-4 business days  [name] [facility], [place] Coordinator [phone]

## 2018-10-26 ENCOUNTER — Other Ambulatory Visit: Payer: Self-pay | Admitting: *Deleted

## 2018-10-26 NOTE — Patient Outreach (Signed)
Outreach call to patient's daughter [name] at [phone], to complete transition of care week 4, spoke with [name] who reports "I'm rushing right out, can't talk, call me back next week or sometime later"  RN CM spoke with [place] for transfer of pt for [facility] delegated RN CM.  In basket sent with report.  Case transferred to [facility] delegated [facility] [facility], [place] Coordinator [phone]

## 2018-10-30 ENCOUNTER — Other Ambulatory Visit: Payer: Self-pay

## 2018-10-30 ENCOUNTER — Other Ambulatory Visit: Payer: Self-pay | Admitting: *Deleted

## 2018-10-30 ENCOUNTER — Encounter: Payer: Self-pay | Admitting: *Deleted

## 2018-10-30 NOTE — Patient Outreach (Signed)
[facility]) Care Management  [place]  10/30/2018   DEIDRE GREGOR 08-09-1928 [id number]  Subjective: Successful follow up with daughter [name].  She states that patient care is becoming overwhelming.  Patient recently had about with constipation and had to disimpact patient. She states that patient has been pulling off her brief making a mess. Discussed use of stool softener to help in order to keep patient from being constipated.  She verbalized understanding.   She is currently talking with her brother about placing patient as she cannot take care of her mother as she won't listen to her.  She continues to crush her medication and place in her tea in order for patient to take medication.  Discussed patient care and possible need for social work to reach back out to daughter to assist with placement.  Daughter is agreeable.     Objective:   Encounter Medications:  Outpatient Encounter Medications as of 10/30/2018  Medication Sig  . anastrozole (ARIMIDEX) 1 MG tablet TAKE 1 TABLET BY MOUTH EVERY DAY (Patient taking differently: Take 1 mg by mouth daily. )  . cholecalciferol (VITAMIN D3) 25 MCG (1000 UT) tablet Take 1 tablet (1,000 Units total) by mouth daily.  . ciprofloxacin (CIPRO) 500 MG tablet Take 1 tablet (500 mg total) by mouth daily with breakfast.  . divalproex (DEPAKOTE) 125 MG DR tablet Take 125 mg by mouth 2 (two) times daily. Pt to take 1/2 tablet  (1/2 of 125 mg) once daily  . QUEtiapine (SEROQUEL) 25 MG tablet Take 1 tablet (25 mg total) by mouth at bedtime.  . sertraline (ZOLOFT) 25 MG tablet Take 25 mg by mouth daily.  . traMADol (ULTRAM) 50 MG tablet Take 1 tablet (50 mg total) by mouth every 6 (six) hours as needed for moderate pain.   No facility-administered encounter medications on file as of 10/30/2018.     Functional Status:  In your present state of health, do you have any difficulty performing the following activities: 09/21/2018  09/21/2018  Hearing? - N  Vision? - N  Difficulty concentrating or making decisions? - Y  Comment - dementia  Walking or climbing stairs? - Y  Comment - walker  cg assist  Dressing or bathing? - Y  Comment - cg assist  Doing errands, shopping? - Y  Comment - cg [place] and eating ? (No Data) Y  Comment cg assists /  cooking -  Using the Toilet? (No Data) Y  Comment cg assists as needed -  In the past six months, have you accidently leaked urine? (No Data) Y  Comment wears adult diapers -  Do you have problems with loss of bowel control? - N  Managing your Medications? - Y  Comment - cg assist  Managing your Finances? - Y  Comment - cg assist  Housekeeping or managing your Housekeeping? - Y  Comment - cg assist  Some recent data might be hidden    Fall/Depression Screening: Fall Risk  09/21/2018 03/31/2017  Falls in the past year? 1 Yes  Number falls in past yr: 1 1  Injury with Fall? 0 No  Risk for fall due to : History of fall(s);Medication side effect -  Follow up Falls evaluation completed;Falls prevention discussed;Education provided -   PHQ 2/9 Scores 09/21/2018 08/08/2017 03/31/2017  PHQ - 2 Score 4 6 0  PHQ- 9 Score 9 15 -    Assessment: Daughter continues to struggle with patient care and really  considering placement.  Plan:    [facility] CM Care Plan Problem One     Most Recent Value  Care Plan Problem One  Decreased quality of life related to dementia  Role Documenting the Problem One  Care Management Coordinator  Care Plan for Problem One  Active  THN Long Term Goal   Pt, CG will demonstrate/ verbalize improved self care related to dementia within 60 days  THN Long Term Goal Start Date  09/21/18  Interventions for Problem One Long Term Goal  Discussed care for dementia patient.  Daughter attempting to care for patient.  she is now in discussion with brother for placement.  THN CM Short Term Goal #1   CG will review EMMI information and verbalize  increased understanding of dementia within 30 days  THN CM Short Term Goal #1 Start Date  09/21/18  Interventions for Short Term Goal #1  Daughter has read material and offers no concerns.    THN CM Care Plan Problem Two     Most Recent Value  Care Plan Problem Two  High risk for falls  Role Documenting the Problem Two  Care Management Coordinator  Care Plan for Problem Two  Active  THN CM Short Term Goal #1   CG will verbalize safety precautions within 30 days  THN CM Short Term Goal #1 Start Date  09/21/18  Interventions for Short Term Goal #2   Disscused with daughter patient safety and fall precautions.     RN CM will message social work to outreach daughter to assist with placement.  RN CM will contact again in two weeks and daughter agreeable.    Jone Baseman, RN, MSN [place] Management Care Management Coordinator Direct Line [phone] Cell [phone] Toll Free: [phone]  Fax: [phone]

## 2018-10-30 NOTE — Patient Outreach (Signed)
[facility]) Care Management  10/30/2018  Jocelyn Mccann [birth year] [id number]   CSW made an attempt to try and contact patient's daughter, Jocelyn Mccann today, per her request, to perform the initial phone assessment on patient, assess and assist with social work needs and services, as well as offer assistance with long-term care placement arrangements for patient.  However, Jocelyn Mccann was unavailable at the time of CSW's call.  CSW left a HIPAA compliant message for Jocelyn Mccann on her home phone, but was unable to leave a message for Jocelyn Mccann on her cell phone, receiving an automated recording indicating that her mailbox was full.  CSW is currently awaiting a return call.  CSW will make a second outreach attempt within the next 3-4 business days, if a return call is not received from Jocelyn Mccann in the meantime.  CSW will also mail an Outreach Letter to patient's home, requesting that Jocelyn Mccann contact [place] if she is interested in receiving social work services through [place] with Scientist, clinical (histocompatibility and immunogenetics).  Nat Christen, BSW, MSW, LCSW  Licensed Education officer, environmental Health System  Mailing [place] [address] [place], [place] [address] Physical Address-[address], [place], [place] [address] Toll Free Main # [phone] Fax # [phone] Cell # [phone]  Office # [phone] [email or web address]

## 2018-10-30 NOTE — Addendum Note (Signed)
Addended by: Jone Baseman on: 10/30/2018 10:12 AM   Modules accepted: Orders

## 2018-11-02 ENCOUNTER — Other Ambulatory Visit: Payer: Self-pay | Admitting: *Deleted

## 2018-11-02 NOTE — Patient Outreach (Signed)
[facility] [facility]) Care Management  11/02/2018  Jocelyn Mccann 1928-09-12 [id number]   CSW made a second attempt to try and contact patient's daughter, [name] on her home phone [phone]) today, per her request, to perform the initial phone assessment on patient, assess and assist with social work needs and services, as well as offer assistance with long-term care placement arrangements for patient.  However, [name] was unavailable at the time of CSW's call.  CSW was unable to leave a HIPAA compliant message on voicemail for [name], as her phone just continued to ring with no answer.  CSW also tried calling [name] on her mobile number [phone]), but again, [name] was unavailable at the time of CSW's call and CSW was unable to leave a message.  CSW received an automated recording indicating that [name]'s mailbox was full and unable to accept messages at this time.  CSW will continue to await a return call.  CSW will make a third and final outreach attempt within the next 3-4 business days, if a return call is not received from [name] in the meantime.    Nat Christen, BSW, MSW, LCSW  Licensed Education officer, environmental Health System  Mailing [place] [address] [place], [place] [address] Physical Address-[address], [place], [place] [address] Toll Free Main # [phone] Fax # [phone] Cell # [phone]  Office # [phone] [email or web address]

## 2018-11-08 ENCOUNTER — Other Ambulatory Visit: Payer: Self-pay | Admitting: *Deleted

## 2018-11-08 NOTE — Patient Outreach (Addendum)
[facility]) Care Management  11/08/2018  Jocelyn Mccann [birth year] [id number]    CSW made a third and final attempt to try and contact patient's daughter, [name] today to perform the phone assessment on patient, as well as assess and assist with social work needs and services, without success.  A HIPAA compliant message was left on voicemail for [name].  CSW is currently awaiting a return call.  CSW also tried calling [name] on her mobile number [phone]), but again, [name] was unavailable at the time of CSW's call and CSW was unable to leave a message.  CSW received an automated recording indicating that [name]'s mailbox was full and unable to accept messages at this time.  CSW will proceed with case closure in two business days, if a return call is not received from [name] in the meantime, as required number of phone attempts have been made and an outreach letter was mailed to patient's home allowing 10 business days for a response.  Nat Christen, BSW, MSW, LCSW  Licensed Education officer, environmental Health System  Mailing [place] [address] [place], [place] [address] Physical Address-[address], [place], [place] [address] Toll Free Main # [phone] Fax # [phone] Cell # [phone]  Office # [phone] [email or web address]

## 2018-11-10 ENCOUNTER — Encounter: Payer: Self-pay | Admitting: *Deleted

## 2018-11-10 ENCOUNTER — Other Ambulatory Visit: Payer: Self-pay | Admitting: *Deleted

## 2018-11-10 NOTE — Patient Outreach (Signed)
[facility]) Care Management  11/10/2018  Jocelyn Mccann 1928/12/06 [id number]   CSW will perform a case closure on patient, due to inability to establish initial phone contact with patient, despite required number of phone attempts made and outreach letter mailed to patient's home allowing 10 business days for a response if patient is interested in receiving social work services through [place] with [facility].  CSW will notify patient's RNCM with [place] Management, Jon Billings of CSW's plans to close patient's case.  CSW will fax an update to patient's Primary Care Physician, Dr. Sarajane Jews Magrinat to ensure that they are aware of CSW's involvement with patient's plan of care.    Nat Christen, BSW, MSW, LCSW  Licensed Education officer, environmental Health System  Mailing [place] [address] [place], [place] [address] Physical Address-[address], [place], [place] [address] Toll Free Main # [phone] Fax # [phone] Cell # [phone]  Office # [phone] [email or web address]

## 2018-11-13 ENCOUNTER — Other Ambulatory Visit: Payer: Self-pay

## 2018-11-13 NOTE — Patient Outreach (Signed)
[facility]) Care Management  11/13/2018  Jocelyn Mccann 05-31-1928 [id number]   Telephone call to caregiver daughter Jocelyn Mccann.  No answer.  HIPAA compliant voice message left.    Plan: RN CM will attempt again within 4 business days and send letter.  Jone Baseman, RN, MSN [place] Management Care Management Coordinator Direct Line [phone] Cell [phone] Toll Free: [phone]  Fax: [phone]

## 2018-11-15 ENCOUNTER — Other Ambulatory Visit: Payer: Self-pay

## 2018-11-15 NOTE — Patient Outreach (Signed)
[place]) Care Management  11/15/2018  Jocelyn Mccann May 16, 1928 [id number]   Telephone call to daughter [name].  She states she cannot talk right now as she is about to go out.  Advised daughter to call CM back later. Daughter provided with CM contact information.  Plan: RN CM will wait return call from daughter if no return call will attempt again within 4 business days.   Jone Baseman, RN, MSN [place] Management Care Management Coordinator Direct Line [phone] Cell [phone] Toll Free: [phone]  Fax: [phone]

## 2018-11-17 ENCOUNTER — Other Ambulatory Visit: Payer: Self-pay

## 2018-11-17 NOTE — Patient Outreach (Signed)
[facility]) Care Management  11/17/2018  CHYRA KEMPINSKI October 23, 1928 [id number]   Telephone call to caregiver daughter [name].  No answer.  HIPAA compliant voice message left.    Plan: RN CM will wait return call. If no return call will attempt again in the next 30 days.    Jone Baseman, RN, MSN [place] Management Care Management Coordinator Direct Line [phone] Cell [phone] Toll Free: [phone]  Fax: [phone]

## 2018-11-26 NOTE — Progress Notes (Signed)
No show

## 2018-11-27 ENCOUNTER — Encounter: Payer: Self-pay | Admitting: Oncology

## 2018-11-27 ENCOUNTER — Inpatient Hospital Stay: Payer: Medicare HMO

## 2018-11-27 ENCOUNTER — Inpatient Hospital Stay: Payer: Medicare HMO | Attending: Oncology

## 2018-11-27 ENCOUNTER — Inpatient Hospital Stay (HOSPITAL_BASED_OUTPATIENT_CLINIC_OR_DEPARTMENT_OTHER): Payer: Medicare HMO | Admitting: Oncology

## 2018-11-27 DIAGNOSIS — C50411 Malignant neoplasm of upper-outer quadrant of right female breast: Secondary | ICD-10-CM

## 2018-11-27 DIAGNOSIS — Z170 Estrogen receptor positive status [ER+]: Secondary | ICD-10-CM

## 2018-11-27 MED ORDER — ANASTROZOLE 1 MG PO TABS: 1.0000 mg | ORAL_TABLET | Freq: Every day | ORAL | 1 refills | Status: DC

## 2018-11-29 ENCOUNTER — Emergency Department (HOSPITAL_COMMUNITY): Payer: Medicare HMO

## 2018-11-29 ENCOUNTER — Emergency Department (HOSPITAL_COMMUNITY)
Admission: EM | Admit: 2018-11-29 | Discharge: 2018-11-29 | Disposition: A | Discharge status: Home / Self Care | Payer: Medicare HMO | Attending: Emergency Medicine | Admitting: Emergency Medicine

## 2018-11-29 ENCOUNTER — Encounter (HOSPITAL_COMMUNITY): Payer: Self-pay | Admitting: Radiology

## 2018-11-29 DIAGNOSIS — F0390 Unspecified dementia without behavioral disturbance: Secondary | ICD-10-CM | POA: Diagnosis not present

## 2018-11-29 DIAGNOSIS — R55 Syncope and collapse: Secondary | ICD-10-CM | POA: Diagnosis not present

## 2018-11-29 DIAGNOSIS — Z79899 Other long term (current) drug therapy: Secondary | ICD-10-CM | POA: Diagnosis not present

## 2018-11-29 DIAGNOSIS — Z853 Personal history of malignant neoplasm of breast: Secondary | ICD-10-CM | POA: Insufficient documentation

## 2018-11-29 DIAGNOSIS — R410 Disorientation, unspecified: Secondary | ICD-10-CM | POA: Diagnosis not present

## 2018-11-29 DIAGNOSIS — R404 Transient alteration of awareness: Secondary | ICD-10-CM | POA: Diagnosis not present

## 2018-11-29 DIAGNOSIS — R4020 Unspecified coma: Secondary | ICD-10-CM | POA: Diagnosis not present

## 2018-11-29 DIAGNOSIS — R58 Hemorrhage, not elsewhere classified: Secondary | ICD-10-CM | POA: Diagnosis not present

## 2018-11-29 LAB — URINALYSIS, COMPLETE (UACMP) WITH MICROSCOPIC
Bilirubin Urine: NEGATIVE
Glucose, UA: >=500 mg/dL — AB
Ketones, ur: NEGATIVE mg/dL
Leukocytes,Ua: NEGATIVE
Nitrite: NEGATIVE
Protein, ur: 30 mg/dL — AB
Specific Gravity, Urine: 1.015 (ref 1.005–1.030)
pH: 5 (ref 5.0–8.0)

## 2018-11-29 LAB — BASIC METABOLIC PANEL
Anion gap: 8 (ref 5–15)
BUN: 14 mg/dL (ref 8–23)
CO2: 26 mmol/L (ref 22–32)
Calcium: 9.7 mg/dL (ref 8.9–10.3)
Chloride: 103 mmol/L (ref 98–111)
Creatinine, Ser: 0.91 mg/dL (ref 0.44–1.00)
GFR calc Af Amer: >60 mL/min (ref >60)
GFR calc non Af Amer: 56 mL/min — ABNORMAL LOW (ref >60)
Glucose, Bld: 201 mg/dL — ABNORMAL HIGH (ref 70–99)
Potassium: 3.8 mmol/L (ref 3.5–5.1)
Sodium: 137 mmol/L (ref 135–145)

## 2018-11-29 LAB — CBG MONITORING, ED: Glucose-Capillary: 154 mg/dL — ABNORMAL HIGH (ref 70–99)

## 2018-11-29 LAB — CBC WITH DIFFERENTIAL/PLATELET
Abs Immature Granulocytes: 0 10*3/uL (ref 0.00–0.07)
Basophils Absolute: 0 10*3/uL (ref 0.0–0.1)
Basophils Relative: 1 %
Eosinophils Absolute: 0 10*3/uL (ref 0.0–0.5)
Eosinophils Relative: 0 %
HCT: 37.5 % (ref 36.0–46.0)
Hemoglobin: 12.8 g/dL (ref 12.0–15.0)
Immature Granulocytes: 0 %
Lymphocytes Relative: 23 %
Lymphs Abs: 0.6 10*3/uL — ABNORMAL LOW (ref 0.7–4.0)
MCH: 32.9 pg (ref 26.0–34.0)
MCHC: 34.1 g/dL (ref 30.0–36.0)
MCV: 96.4 fL (ref 80.0–100.0)
Monocytes Absolute: 0.2 10*3/uL (ref 0.1–1.0)
Monocytes Relative: 9 %
Neutro Abs: 1.8 10*3/uL (ref 1.7–7.7)
Neutrophils Relative %: 67 %
Platelets: 213 10*3/uL (ref 150–400)
RBC: 3.89 MIL/uL (ref 3.87–5.11)
RDW: 14.2 % (ref 11.5–15.5)
WBC: 2.7 10*3/uL — ABNORMAL LOW (ref 4.0–10.5)
nRBC: 0 % (ref 0.0–0.2)

## 2018-11-29 LAB — VALPROIC ACID LEVEL: Valproic Acid Lvl: <10 ug/mL — ABNORMAL LOW (ref 50.0–100.0)

## 2018-11-29 MED ORDER — LEVETIRACETAM 500 MG PO TABS: 500.0000 mg | ORAL_TABLET | Freq: Two times a day (BID) | ORAL | 1 refills | Status: DC

## 2018-11-29 MED ORDER — LEVETIRACETAM 500 MG PO TABS
500.0000 mg | ORAL_TABLET | Freq: Once | ORAL | Status: AC
  Administered 2018-11-29: 500 mg via ORAL
  Filled 2018-11-29: qty 1

## 2018-11-29 MED ORDER — SODIUM CHLORIDE 0.9 % IV BOLUS
1000.0000 mL | Freq: Once | INTRAVENOUS | Status: AC
  Administered 2018-11-29: 1000 mL via INTRAVENOUS

## 2018-11-29 NOTE — ED Triage Notes (Signed)
Pt came in [facility] with c/o of post syncopal episode. Coming from home, patient slumped forward at the dinner table and was unresponsive for about 42min. Pt had subdural bleed in august. Upon EMS arrival she had repeative questioning. Now following commands.  98/60BB, 260CBG, 97.3Temp

## 2018-11-29 NOTE — ED Provider Notes (Signed)
[facility] EMERGENCY DEPARTMENT Provider Note   CSN: [id number] Arrival date & time: 11/29/18  1937     History   Chief Complaint Chief Complaint  Patient presents with  . Syncopal Episode    HPI Jocelyn Mccann is a 85 y.o. female.  Level 5 caveat secondary to dementia.  She is brought in by ambulance after a syncopal event from home.  History given primarily by granddaughter who was there.  She said there was sitting at the dinner table when she acutely slumped forward and was unresponsive.  She said that she was drooling and not speaking.  She said this went on for about 20 minutes before EMS got there.  No observed seizure activity.  EMS initially found with repetitive speech and somewhat combative which is improved on arrival here.  Patient herself now just complains of feeling cold.  Granddaughter also states that they have noticed some increased urination and increased incontinence from baseline.  No reported fevers vomiting diarrhea recent falls.  She had a probable fall back in August in which she had a subdural.     The history is provided by the patient and a relative.  Loss of Consciousness Episode history:  Single Most recent episode:  Today Duration:  20 minutes Progression:  Resolved Chronicity:  New Context: normal activity and sitting down   Witnessed: yes   Relieved by:  Nothing Worsened by:  Nothing Ineffective treatments:  Lying down Associated symptoms: no chest pain, no difficulty breathing, no fever, no headaches, no nausea, no recent fall, no shortness of breath and no vomiting     Past Medical History:  Diagnosis Date  . Bilateral knee pain   . Breast cancer ([place])    diagnosed 11/2017  . Dementia ([place])   . Osteoporosis   . Urge incontinence of urine     Patient Active Problem List   Diagnosis Date Noted  . SDH (subdural hematoma) ([place]) 08/21/2018  . Malignant neoplasm of upper-outer quadrant of right breast in female,  estrogen receptor positive ([place]) 12/20/2017  . Osteoarthritis of knee 06/01/2017  . Bilateral knee pain 03/31/2017  . Urge incontinence of urine 03/31/2017  . Generalized anxiety disorder 03/31/2017  . Osteoporosis without current pathological fracture 03/31/2017  . Dementia ([place]) 03/31/2017    Past Surgical History:  Procedure Laterality Date  . BREAST LUMPECTOMY WITH SENTINEL LYMPH NODE BIOPSY Right 2001  . BREAST LUMPECTOMY WITH SENTINEL LYMPH NODE BIOPSY Right 12/30/2017   Procedure: RIGHT BREAST LUMPECTOMY WITH SENTINEL LYMPH NODE BIOPSY ERAS PATHWAY;  Surgeon: Stark Klein, MD;  Location: [facility];  Service: General;  Laterality: Right;  . DENTAL SURGERY       OB History   No obstetric history on file.      Home Medications    Prior to Admission medications   Medication Sig Start Date End Date Taking? Authorizing Provider  anastrozole (ARIMIDEX) 1 MG tablet Take 1 tablet (1 mg total) by mouth daily. 11/27/18   Magrinat, Virgie Dad, MD  cholecalciferol (VITAMIN D3) 25 MCG (1000 UT) tablet Take 1 tablet (1,000 Units total) by mouth daily. 02/02/18   Magrinat, Virgie Dad, MD  ciprofloxacin (CIPRO) 500 MG tablet Take 1 tablet (500 mg total) by mouth daily with breakfast. 10/06/18   Magrinat, Virgie Dad, MD  divalproex (DEPAKOTE) 125 MG DR tablet Take 125 mg by mouth 2 (two) times daily. Pt to take 1/2 tablet  (1/2 of 125 mg) once daily    [provider]  QUEtiapine (SEROQUEL) 25 MG tablet Take 1 tablet (25 mg total) by mouth at bedtime. 08/25/18   Shelly Coss, MD  sertraline (ZOLOFT) 25 MG tablet Take 25 mg by mouth daily.    [provider]  traMADol (ULTRAM) 50 MG tablet Take 1 tablet (50 mg total) by mouth every 6 (six) hours as needed for moderate pain. 08/25/18   Shelly Coss, MD    Family History Family History  Problem Relation Age of Onset  . Cancer Mother        breast    Social History Social History   Tobacco Use  . Smoking status: Never  Smoker  . Smokeless tobacco: Never Used  Substance Use Topics  . Alcohol use: Never    Frequency: Never  . Drug use: Never     Allergies   Patient has no known allergies.   Review of Systems Review of Systems  Unable to perform ROS: Dementia  Constitutional: Negative for fever.  Respiratory: Negative for shortness of breath.   Cardiovascular: Positive for syncope. Negative for chest pain.  Gastrointestinal: Negative for nausea and vomiting.  Neurological: Negative for headaches.     Physical Exam Updated Vital Signs BP 128/80 (BP Location: Right Arm)   Pulse 77   Temp 98 F (36.7 C) (Oral)   SpO2 100%   Physical Exam Vitals signs and nursing note reviewed.  Constitutional:      General: She is not in acute distress.    Appearance: She is well-developed.  HENT:     Head: Normocephalic and atraumatic.  Eyes:     Conjunctiva/sclera: Conjunctivae normal.  Neck:     Musculoskeletal: Neck supple.  Cardiovascular:     Rate and Rhythm: Normal rate and regular rhythm.     Pulses: Normal pulses.     Heart sounds: No murmur.  Pulmonary:     Effort: Pulmonary effort is normal. No respiratory distress.     Breath sounds: Normal breath sounds.  Abdominal:     Palpations: Abdomen is soft.     Tenderness: There is no abdominal tenderness.  Musculoskeletal: Normal range of motion.        General: Tenderness (knees bilat - chronic) present.  Skin:    General: Skin is warm and dry.     Capillary Refill: Capillary refill takes less than 2 seconds.  Neurological:     General: No focal deficit present.     Mental Status: She is alert. Mental status is at baseline. She is disoriented.     Cranial Nerves: No cranial nerve deficit.     Sensory: No sensory deficit.     Motor: No weakness.      ED Treatments / Results  Labs (all labs ordered are listed, but only abnormal results are displayed) Labs Reviewed  BASIC METABOLIC PANEL - Abnormal; Notable for the following  components:      Result Value   Glucose, Bld 201 (*)    GFR calc non Af Amer 56 (*)    All other components within normal limits  CBC WITH DIFFERENTIAL/PLATELET - Abnormal; Notable for the following components:   WBC 2.7 (*)    Lymphs Abs 0.6 (*)    All other components within normal limits  URINALYSIS, COMPLETE (UACMP) WITH MICROSCOPIC - Abnormal; Notable for the following components:   Glucose, UA >=500 (*)    Hgb urine dipstick SMALL (*)    Protein, ur 30 (*)    Bacteria, UA RARE (*)  All other components within normal limits  VALPROIC ACID LEVEL - Abnormal; Notable for the following components:   Valproic Acid Lvl <10 (*)    All other components within normal limits  CBG MONITORING, ED - Abnormal; Notable for the following components:   Glucose-Capillary 154 (*)    All other components within normal limits    EKG None  Radiology Dg Chest 2 View  Result Date: 11/29/2018 CLINICAL DATA:  Syncope, no history of heart or lung problems. Nonsmoker EXAM: CHEST - 2 VIEW COMPARISON:  None. FINDINGS: Surgical clips present in the right chest wall soft tissues and right axilla with asymmetry of the breast shadows, possibly related to prior breast surgery and axillary dissection. No consolidation, features of edema, pneumothorax, or effusion. Pulmonary vascularity is normally distributed. The aorta is calcified. The remaining cardiomediastinal contours are unremarkable. No acute osseous or soft tissue abnormality. Multilevel degenerative changes are present in the imaged portions of the spine. IMPRESSION: No acute cardiopulmonary abnormality. Postsurgical changes of the right breast and chest wall with asymmetry of the fresh shadows, correlate for prior mastectomy or lumpectomy. Electronically Signed   By: Lovena Le M.D.   On: 11/29/2018 20:39   Ct Head Wo Contrast  Result Date: 11/29/2018 CLINICAL DATA:  Syncope tonight. EXAM: CT HEAD WITHOUT CONTRAST TECHNIQUE: Contiguous axial  images were obtained from the base of the skull through the vertex without intravenous contrast. COMPARISON:  09/12/2018 FINDINGS: Brain: There is no acute intracranial hemorrhage or acute infarction. There is diffuse cerebral cortical atrophy, particularly severe in the temporal lobes. Prominent periventricular white matter lucency consistent with chronic small vessel ischemic disease. Small lacunar infarcts in the anterior limbs of the internal capsules and in the left caudate nucleus, all unchanged. Vascular: No hyperdense vessel or unexpected calcification. Skull: Normal. Negative for fracture or focal lesion. Sinuses/Orbits: No acute finding. Other: None IMPRESSION: No acute intracranial abnormality. Atrophy with chronic small vessel ischemic changes. Old lacunar infarcts. Electronically Signed   By: Lorriane Shire M.D.   On: 11/29/2018 20:50    Procedures Procedures (including critical care time)  Medications Ordered in ED Medications  sodium chloride 0.9 % bolus 1,000 mL (0 mLs Intravenous Stopped 11/29/18 2210)  levETIRAcetam (KEPPRA) tablet 500 mg (500 mg Oral Given 11/29/18 2218)     Initial Impression / Assessment and Plan / ED Course  I have reviewed the triage vital signs and the nursing notes.  Pertinent labs & imaging results that were available during my care of the patient were reviewed by me and considered in my medical decision making (see chart for details).  Clinical Course as of Nov 30 939  Wed Nov 29, 2018  2147 Patient here after an unresponsive spell at home.  She has a history of dementia.  History by granddaughter, who is concerned that the last time she had an episode like this she had a subdural.  Differential includes vasovagal syncope, arrhythmia, seizure, head bleed, metabolic derangement.  Her work-up so far has been unremarkable with an head CT showing no acute changes, negative chest x-ray negative urinalysis normal labs other than a mildly low white count of  2.7.  This is similar to her prior.   [MB]  2148 Reviewed with neurology on-call Dr. Malen Gauze who recommended that the patient be placed on Keppra due to all the volume loss on her head CT and that she follow-up with neurology as an outpatient.   [MB]  2152 ECG showing poor baseline sinus rhythm rate of  92 LVH nonspecific ST-T's.  Similar to 8/20.   [MB]    Clinical Course User Index [MB] Hayden Rasmussen, MD        Final Clinical Impressions(s) / ED Diagnoses   Final diagnoses:  Syncope, unspecified syncope type    ED Discharge Orders         Ordered    levETIRAcetam (KEPPRA) 500 MG tablet  2 times daily     11/29/18 2238    Ambulatory referral to Neurology    Comments: An appointment is requested in approximately: 2 weeks   11/29/18 2238           Hayden Rasmussen, MD 11/30/18 [phone]

## 2018-11-29 NOTE — ED Notes (Signed)
Patient verbalizes understanding of discharge instructions. Opportunity for questioning and answers were provided. Armband removed by staff, pt discharged from ED. Pt. In wheelchair and discharged home.

## 2018-11-29 NOTE — Discharge Instructions (Addendum)
You were seen in the emergency department for an unresponsive spell.  You had blood work urinalysis chest x-ray and CAT scan of your head that did not show any significant findings.  We reviewed your case with neurology and they are recommending starting you on Keppra twice a day for possible seizure.  We have placed a referral into neurology for an outpatient follow-up.  Please continue your regular medicines and return to the emergency department if any concerning symptoms.

## 2018-12-05 ENCOUNTER — Encounter: Payer: Self-pay | Admitting: Neurology

## 2018-12-05 ENCOUNTER — Other Ambulatory Visit: Payer: Self-pay

## 2018-12-05 ENCOUNTER — Ambulatory Visit (INDEPENDENT_AMBULATORY_CARE_PROVIDER_SITE_OTHER): Payer: Medicare HMO | Admitting: Neurology

## 2018-12-05 VITALS — BP 155/80 | HR 92 | Temp 97.6°F | Wt 135.0 lb

## 2018-12-05 DIAGNOSIS — C50411 Malignant neoplasm of upper-outer quadrant of right female breast: Secondary | ICD-10-CM | POA: Diagnosis not present

## 2018-12-05 DIAGNOSIS — Z170 Estrogen receptor positive status [ER+]: Secondary | ICD-10-CM

## 2018-12-05 DIAGNOSIS — G8929 Other chronic pain: Secondary | ICD-10-CM

## 2018-12-05 DIAGNOSIS — S065X9A Traumatic subdural hemorrhage with loss of consciousness of unspecified duration, initial encounter: Secondary | ICD-10-CM

## 2018-12-05 DIAGNOSIS — M25562 Pain in left knee: Secondary | ICD-10-CM | POA: Diagnosis not present

## 2018-12-05 DIAGNOSIS — G301 Alzheimer's disease with late onset: Secondary | ICD-10-CM | POA: Diagnosis not present

## 2018-12-05 DIAGNOSIS — R55 Syncope and collapse: Secondary | ICD-10-CM | POA: Diagnosis not present

## 2018-12-05 DIAGNOSIS — M25561 Pain in right knee: Secondary | ICD-10-CM

## 2018-12-05 DIAGNOSIS — F02818 Dementia in other diseases classified elsewhere, unspecified severity, with other behavioral disturbance: Secondary | ICD-10-CM

## 2018-12-05 DIAGNOSIS — F0281 Dementia in other diseases classified elsewhere with behavioral disturbance: Secondary | ICD-10-CM

## 2018-12-05 DIAGNOSIS — N3941 Urge incontinence: Secondary | ICD-10-CM | POA: Diagnosis not present

## 2018-12-05 DIAGNOSIS — S065XAA Traumatic subdural hemorrhage with loss of consciousness status unknown, initial encounter: Secondary | ICD-10-CM

## 2018-12-05 MED ORDER — SERTRALINE HCL 25 MG PO TABS: 25.0000 mg | ORAL_TABLET | Freq: Every day | ORAL | 11 refills | Status: DC

## 2018-12-05 MED ORDER — HYDROXYZINE HCL 25 MG PO TABS: ORAL_TABLET | ORAL | 5 refills | Status: DC

## 2018-12-05 MED ORDER — DONEPEZIL HCL 5 MG PO TABS: 5.0000 mg | ORAL_TABLET | Freq: Every day | ORAL | 0 refills | Status: DC

## 2018-12-05 NOTE — Progress Notes (Signed)
[facility]  PATIENT: Jocelyn Mccann DOB: 01/16/1928  REFERRING DOCTOR OR PCP:  Lurline Del, MD SOURCE: patient,notes from PCP  _________________________________   HISTORICAL  CHIEF COMPLAINT:  Chief Complaint  Patient presents with  . New Patient (Initial Visit)    Rm 13 referred by [facility] ED for syncopal episode- episdoe took place on 11/29/2018    HISTORY OF PRESENT ILLNESS:  I had the pleasure seeing your patient, Jocelyn Mccann, at [facility] for neurologic consultation regarding her episode of syncope last week and also cognitive changes over the last couple of years.  She is an 85 year old woman who had an episode of syncope 11/29/2018.  She was siting at the table for dinner when she slumped forward and was unresponsive.    She was drooling and not speaking.    There was no focal or generalized seizure activity.  There was no incontinence.    When EMS arrived, about 20 minutes after symptom onset, she was initially unresponsive and then she was repeating herself and was combative for a few minutes.   She was close to baseline by the time she went in the ambulance and completely baseline by the time she got to the ED.    Keppra was prescribed for possible seizure disorder but she has not taken it yet.  She has had progressive cognitive issues including poor memory, apathy and confusion.  She has not been noted to have difficulties with her speech.  She is able to do all of her independent ADLs.  She does not do more complicated tasks.  She is able to play bingo.    She has not done financial tasks x years.    She does not shop.  She wanders in the middle of the night within the home and sometimes calls out for her mother when this occurs.   She had sundowning when hospitalized in August for the subdural hematoma.   She was placed on Depakote for behavioral reasons during the hospitalized but she stopped after a week back home as she has much  less behavioral issues at home.  The daughter does report that the wandering at night can be an issue.  The daughter does not think that her mother has depression.  She has a history of a recent right convexity subdural hematoma.  She had a bad headache and went to the ED in August and was found to have a SDH on CT scan.  There was no known fall.    A decision was made to follow the hemorrhage and a follow-up CT scan showed improvement.  The most recent CT scan performed last week for her syncope showed complete resolution.    At baseline, she walks poorly and uses a walker.   She has a history of knee DJD and TKR has been discussed  She has breast cancer and has had a lumpectomy.   The cancer was discovered 09/2017 and surgery was a couple months later.      She is reportedly doing well with this.     I personally reviewed several CT scans from 08/21/2018, 09/12/2018 and 11/29/2018.  The earlier CT scan show a right convexity subdural hematoma improved on 09/12/2018 versus 08/21/2018.  It was resolved by 11/29/2018.  She has moderate generalized cortical atrophy but more severe mesial temporal lobe atrophy.  There are some calcification of the vertebral and internal carotid arteries.  Chronic microvascular ischemic changes are noted.  REVIEW OF  SYSTEMS: Constitutional: No fevers, chills, sweats, or change in appetite Eyes: No visual changes, double vision, eye pain Ear, nose and throat: No hearing loss, ear pain, nasal congestion, sore throat Cardiovascular: No chest pain, palpitations Respiratory: No shortness of breath at rest or with exertion.   No wheezes GastrointestinaI: No nausea, vomiting, diarrhea, abdominal pain, fecal incontinence Genitourinary: No dysuria, urinary retention or frequency.  No nocturia. Musculoskeletal:Bilateral knee pain  integumentary: No rash, pruritus, skin lesions Neurological: as above Psychiatric: See above Endocrine: No palpitations, diaphoresis, change in  appetite, change in weigh or increased thirst Hematologic/Lymphatic: No anemia, purpura, petechiae. Allergic/Immunologic: No itchy/runny eyes, nasal congestion, recent allergic reactions, rashes  ALLERGIES: No Known Allergies  HOME MEDICATIONS:  Current Outpatient Medications:  .  cholecalciferol (VITAMIN D3) 25 MCG (1000 UT) tablet, Take 1 tablet (1,000 Units total) by mouth daily., Disp: 90 tablet, Rfl: 4 .  traMADol (ULTRAM) 50 MG tablet, Take 1 tablet (50 mg total) by mouth every 6 (six) hours as needed for moderate pain., Disp: 15 tablet, Rfl: 0 .  anastrozole (ARIMIDEX) 1 MG tablet, Take 1 tablet (1 mg total) by mouth daily. (Patient not taking: Reported on 12/05/2018), Disp: 90 tablet, Rfl: 1 .  donepezil (ARICEPT) 5 MG tablet, Take 1 tablet (5 mg total) by mouth at bedtime. For Alzheimer's disease, Disp: 30 tablet, Rfl: 0 .  hydrOXYzine (ATARAX/VISTARIL) 25 MG tablet, One po at night for sleep and anxiety, Disp: 30 tablet, Rfl: 5 .  levETIRAcetam (KEPPRA) 500 MG tablet, Take 1 tablet (500 mg total) by mouth 2 (two) times daily. (Patient not taking: Reported on 12/05/2018), Disp: 60 tablet, Rfl: 1 .  sertraline (ZOLOFT) 25 MG tablet, Take 1 tablet (25 mg total) by mouth daily., Disp: 30 tablet, Rfl: 11  PAST MEDICAL HISTORY: Past Medical History:  Diagnosis Date  . Bilateral knee pain   . Breast cancer ([place])    diagnosed 11/2017  . Dementia ([place])   . Osteoporosis   . Urge incontinence of urine     PAST SURGICAL HISTORY: Past Surgical History:  Procedure Laterality Date  . BREAST LUMPECTOMY WITH SENTINEL LYMPH NODE BIOPSY Right 2001  . BREAST LUMPECTOMY WITH SENTINEL LYMPH NODE BIOPSY Right 12/30/2017   Procedure: RIGHT BREAST LUMPECTOMY WITH SENTINEL LYMPH NODE BIOPSY ERAS PATHWAY;  Surgeon: Stark Klein, MD;  Location: [facility];  Service: General;  Laterality: Right;  . DENTAL SURGERY      FAMILY HISTORY: Family History  Problem Relation Age of Onset  . Cancer Mother         breast    SOCIAL HISTORY:  Social History   Socioeconomic History  . Marital status: Widowed    Spouse name: Not on file  . Number of children: 3  . Years of education: Not on file  . Highest education level: Not on file  Occupational History  . Not on file  Social Needs  . Financial resource strain: Not on file  . Food insecurity    Worry: Not on file    Inability: Not on file  . Transportation needs    Medical: Not on file    Non-medical: Not on file  Tobacco Use  . Smoking status: Never Smoker  . Smokeless tobacco: Never Used  Substance and Sexual Activity  . Alcohol use: Never    Frequency: Never  . Drug use: Never  . Sexual activity: Not on file  Lifestyle  . Physical activity    Days per week: Not on file  Minutes per session: Not on file  . Stress: Not on file  Relationships  . Social Herbalist on phone: Not on file    Gets together: Not on file    Attends religious service: Not on file    Active member of club or organization: Not on file    Attends meetings of clubs or organizations: Not on file    Relationship status: Not on file  . Intimate partner violence    Fear of current or ex partner: Not on file    Emotionally abused: Not on file    Physically abused: Not on file    Forced sexual activity: Not on file  Other Topics Concern  . Not on file  Social History Narrative  . Not on file     PHYSICAL EXAM  Vitals:   12/05/18 1521  BP: (!) 155/80  Pulse: 92  Temp: 97.6 F (36.4 C)  TempSrc: Temporal  Weight: 135 lb (61.2 kg)    Body mass index is 21.79 kg/m.   General: The patient is well-developed and well-nourished and in no acute distress  HEENT:  Head is [place]/AT.  Sclera are anicteric.    Neck: No carotid bruits are noted.  The neck is nontender.  Cardiovascular: The heart has a regular rate and rhythm with a normal S1 and S2. There were no murmurs, gallops or rubs.    Skin: Extremities are without rash or   edema.  Musculoskeletal:  Back is nontender.  She has tenderness at the knees.  She has mild reduced range of motion in the back and hips.  Neurologic Exam  Mental status: The patient is alert and oriented x1 (does not know place or date) at the time of the examination. The patient has poor recent memory getting 0 out of 3 items without a prompt and 1 out of 3 items with a prompt.   Speech is normal.  She had difficulty drawing a clock taking several minutes.  Numbers were fairly well space but she could not determine how to put the hands in the clock.  She cannot copy a geometric figure.  Cranial nerves: Extraocular movements are full. Pupils are equal, round, and reactive to light and accomodation.  Visual fields are full.  Facial symmetry is present. There is good facial sensation to soft touch bilaterally.Facial strength is normal.  Trapezius and sternocleidomastoid strength is normal. No dysarthria is noted.   No obvious hearing deficits are noted.  Motor:  Muscle bulk is normal.   Tone is normal. Strength is  5 / 5 in all 4 extremities.   Sensory: Sensory testing is intact to pinprick, soft touch and vibration sensation in all 4 extremities.  Coordination: Cerebellar testing reveals good finger-nose-finger and mildly reduced heel-to-shin bilaterally (probably more due to arthritis than neurologic issues)  Gait and station: Station is normal.   Gait is arthritic and she has a small stride.  She takes 5 steps to turn 180 degrees.  She cannot tandem walk.. Romberg is negative.   Reflexes: Deep tendon reflexes are normal.  Plantar responses are flexor.     DIAGNOSTIC DATA (LABS, IMAGING, TESTING) - I reviewed patient records, labs, notes, testing and imaging myself where available.  Lab Results  Component Value Date   WBC 2.7 (L) 11/29/2018   HGB 12.8 11/29/2018   HCT 37.5 11/29/2018   MCV 96.4 11/29/2018   PLT 213 11/29/2018      Component Value Date/Time   NA  137 11/29/2018  1947   K 3.8 11/29/2018 1947   CL 103 11/29/2018 1947   CO2 26 11/29/2018 1947   GLUCOSE 201 (H) 11/29/2018 1947   BUN 14 11/29/2018 1947   CREATININE 0.91 11/29/2018 1947   CREATININE 0.85 03/31/2017 1144   CALCIUM 9.7 11/29/2018 1947   PROT 8.0 02/01/2018 1423   ALBUMIN 3.6 02/01/2018 1423   AST 21 02/01/2018 1423   ALT 13 02/01/2018 1423   ALKPHOS 114 02/01/2018 1423   BILITOT 0.5 02/01/2018 1423   GFRNONAA 56 (L) 11/29/2018 1947   GFRNONAA 62 03/31/2017 1144   GFRAA >60 11/29/2018 1947   GFRAA 71 03/31/2017 1144   No results found for: CHOL, HDL, LDLCALC, LDLDIRECT, TRIG, CHOLHDL Lab Results  Component Value Date   HGBA1C 5.7 (H) 03/31/2017   No results found for: VITAMINB12 Lab Results  Component Value Date   TSH 0.43 04/14/2017       ASSESSMENT AND PLAN  Late onset Alzheimer's disease with behavioral disturbance (HCC)  SDH (subdural hematoma) (HCC)  Chronic pain of both knees  Urge incontinence of urine  Malignant neoplasm of upper-outer quadrant of right breast in female, estrogen receptor positive ([place])  Syncope, unspecified syncope type - Plan: EEG adult   In summary, Ms. Tandon is an 85 year old woman with one episode of syncope last week.  The etiology is uncertain.  She did not have witnessed generalized tonic-clonic activity and her granddaughter was with her at the time.  However, there was confusion upon arousal 20 minutes later which could be consistent with a postictal state.  Levetiracetam was prescribed but she has not taken any yet.  We will check an EEG to determine if there is any epileptiform activity.  If present, she should stay on levetiracetam or another antiepileptic long-term.  She has progressive cognitive changes consistent with dementia.  Her performance today would be consistent with moderate Alzheimer's disease.  Atrophy in the medial temporal lobe is also consistent with Alzheimer's disease.  I will place her on donepezil 5 mg and  that can be increased to 10 mg if well tolerated.  Additionally, for her sundowning and insomnia I will have her take hydroxyzine at night.  They will return to see [place] in about 4 months or sooner if there are new or worsening neurologic symptoms.   Richard A. Felecia Shelling, MD, [facility] [id number], [id number] PM Certified in Neurology, Clinical Neurophysiology, Sleep Medicine and Neuroimaging  [facility] [address] [place], [place] [address] [phone]

## 2018-12-14 ENCOUNTER — Other Ambulatory Visit: Payer: Self-pay

## 2018-12-14 NOTE — Patient Outreach (Signed)
[facility] ([facility]) Care Management  [facility] Care Manager  12/14/2018   Jocelyn Mccann 04/20/1928 [id number]  Subjective: Spoke with daughter [name].  She reports being very busy with her mothers care.  She states that patient gets up during the night and has frequent times of being uncooperative.  Discussed alzheimer's and disease process and how to handle patient when she is uncooperative.  She verbalized understanding and denies any needs.  Objective:   Encounter Medications:  Outpatient Encounter Medications as of 12/14/2018  Medication Sig  . anastrozole (ARIMIDEX) 1 MG tablet Take 1 tablet (1 mg total) by mouth daily. (Patient not taking: Reported on 12/05/2018)  . cholecalciferol (VITAMIN D3) 25 MCG (1000 UT) tablet Take 1 tablet (1,000 Units total) by mouth daily.  . donepezil (ARICEPT) 5 MG tablet Take 1 tablet (5 mg total) by mouth at bedtime. For Alzheimer's disease  . hydrOXYzine (ATARAX/VISTARIL) 25 MG tablet One po at night for sleep and anxiety  . levETIRAcetam (KEPPRA) 500 MG tablet Take 1 tablet (500 mg total) by mouth 2 (two) times daily. (Patient not taking: Reported on 12/05/2018)  . sertraline (ZOLOFT) 25 MG tablet Take 1 tablet (25 mg total) by mouth daily.  . traMADol (ULTRAM) 50 MG tablet Take 1 tablet (50 mg total) by mouth every 6 (six) hours as needed for moderate pain.   No facility-administered encounter medications on file as of 12/14/2018.     Functional Status:  In your present state of health, do you have any difficulty performing the following activities: 09/21/2018 09/21/2018  Hearing? - N  Vision? - N  Difficulty concentrating or making decisions? - Y  Comment - dementia  Walking or climbing stairs? - Y  Comment - walker  cg assist  Dressing or bathing? - Y  Comment - cg assist  Doing errands, shopping? - Y  Comment - cg assist  Preparing Food and eating ? (No Data) Y  Comment cg assists /  cooking -  Using the Toilet? (No Data) Y   Comment cg assists as needed -  In the past six months, have you accidently leaked urine? (No Data) Y  Comment wears adult diapers -  Do you have problems with loss of bowel control? - N  Managing your Medications? - Y  Comment - cg assist  Managing your Finances? - Y  Comment - cg assist  Housekeeping or managing your Housekeeping? - Y  Comment - cg assist  Some recent data might be hidden    Fall/Depression Screening: Fall Risk  09/21/2018 03/31/2017  Falls in the past year? 1 Yes  Number falls in past yr: 1 1  Injury with Fall? 0 No  Risk for fall due to : History of fall(s);Medication side effect -  Follow up Falls evaluation completed;Falls prevention discussed;Education provided -   PHQ 2/9 Scores 09/21/2018 08/08/2017 03/31/2017  PHQ - 2 Score 4 6 0  PHQ- 9 Score 9 15 -    Assessment: Daughter continues to care and manage mother with alzheimer's  Plan:  [facility] CM Care Plan Problem One     Most Recent Value  Care Plan Problem One  Decreased quality of life related to dementia  Role Documenting the Problem One  Care Management Telephonic Coordinator  Care Plan for Problem One  Active  [facility] Long Term Goal   Pt, CG will demonstrate/ verbalize improved self care related to dementia within 60 days  [facility] Long Term Goal Start Date  12/14/18  Interventions   for Problem One Long Term Goal  Discussed patient overall care and condition.  Discussed care techniques when patient is uncooperative.    [facility] CM Short Term Goal #1   CG will review EMMI information and verbalize increased understanding of dementia within 30 days  [facility] CM Short Term Goal #1 Start Date  09/21/18  [facility] CM Short Term Goal #1 Met Date  12/14/18  Interventions for Short Term Goal #1  No questions about information.      [facility] CM Care Plan Problem Two     Most Recent Value  Care Plan Problem Two  High risk for falls  Role Documenting the Problem Two  Care Management Telephonic Coordinator  Care Plan for Problem Two   Active  [facility] CM Short Term Goal #1   CG will verbalize safety precautions within 30 days  [facility] CM Short Term Goal #1 Start Date  09/21/18  [facility] CM Short Term Goal #1 Met Date   12/14/18     RN CM will attempt again in the month of January and daughter agreeable.     J , RN, MSN [facility] Care Management Care Management Coordinator Direct Line [phone] Cell [phone] Toll Free: [phone]  Fax: [phone]   

## 2018-12-19 ENCOUNTER — Encounter: Payer: Self-pay | Admitting: *Deleted

## 2018-12-27 ENCOUNTER — Ambulatory Visit (INDEPENDENT_AMBULATORY_CARE_PROVIDER_SITE_OTHER): Payer: Medicare HMO | Admitting: Neurology

## 2018-12-27 ENCOUNTER — Other Ambulatory Visit: Payer: Self-pay | Admitting: Neurology

## 2018-12-27 ENCOUNTER — Other Ambulatory Visit: Payer: Self-pay

## 2018-12-27 DIAGNOSIS — R55 Syncope and collapse: Secondary | ICD-10-CM | POA: Diagnosis not present

## 2019-01-06 NOTE — Progress Notes (Signed)
   [facility]  EEG (ELECTROENCEPHALOGRAM) REPORT   STUDY DATE: 12/27/2018 PATIENT NAME: Jocelyn Mccann DOB: 1928-04-22 MRN: [id number]  ORDERING CLINICIAN: Avrohom Mckelvin A. Felecia Shelling, MD. PhD  TECHNOLOGIST: Nonda Lou, RPSGT TECHNIQUE: Electroencephalogram was recorded utilizing standard 10-20 system of lead placement and reformatted into average and bipolar montages.  RECORDING TIME: 27 minutes 11 seconds  CLINICAL INFORMATION: 85 year old woman with an episode of syncope  FINDINGS: A digital EEG was performed while the patient was awake and drowsy. While awake and most alert there was a 8 hz posterior dominant rhythm.  There was asymmetric left greater than right slowing in the temporal and frontal lobes.  Slowly worsen with hyperventilation.  There is no actual seizures seen though some frontotemporal spikes were seen on the left, increased with photic stimulation.Marland Kitchen  Photic stimulation had a normal driving response. Hyperventilation and recovery did not change the underlying rhythms. EKG channel shows normal sinus rhythm.  Sleep was recorded..  IMPRESSION: This was an abnormal EEG while the patient was awake and asleep.  It showed left greater than right frontotemporal slowing with some left frontotemporal spikes.  No actual seizures were recorded but these findings would be consistent with a lowered seizure threshold.   INTERPRETING PHYSICIAN:   Neeko Pharo A. Felecia Shelling, MD, PhD, [facility] Certified in Neurology, Clinical Neurophysiology, Sleep Medicine, Pain Medicine and Neuroimaging  [facility] [address] [place], [place] [address] [phone]

## 2019-01-08 ENCOUNTER — Telehealth: Payer: Self-pay | Admitting: *Deleted

## 2019-01-08 MED ORDER — LEVETIRACETAM 250 MG PO TABS: 250.0000 mg | ORAL_TABLET | Freq: Two times a day (BID) | ORAL | 5 refills | Status: DC

## 2019-01-08 MED ORDER — QUETIAPINE FUMARATE 25 MG PO TABS: 25.0000 mg | ORAL_TABLET | Freq: Every day | ORAL | 5 refills | Status: DC

## 2019-01-08 NOTE — Telephone Encounter (Signed)
Called and spoke with pt daughter. Relayed Dr. Garth Bigness recommendation. She is agreeable to try this. I escribed to [facility] on file. Asked that hydroxyzine be d/c'd.

## 2019-01-08 NOTE — Telephone Encounter (Signed)
Called and spoke with daughter, on [place]. Relayed EEG results per Dr. Felecia Shelling note. She verbalized understanding. I escribed rx to [place], [place].  She is requesting EEG results. Advised I will send message to medical records to f/u with her about getting a copy.   She also reported hydroxyzine not helping her mother sleep. She is still not sleeping at night. Wanting to know alternative for her. Advised I will send message to MD and f/u with her.

## 2019-01-08 NOTE — Telephone Encounter (Signed)
We can do Seroquel 25 mg at night (if she tolerates it well but it is not effective she can double the dose to 50 mg).  Some people do get a calming effect from levetiracetam as well which we  just started.  #30 #5

## 2019-01-08 NOTE — Telephone Encounter (Signed)
-----   Message from Britt Bottom, MD sent at 01/06/2019  6:40 PM EST ----- Please let the family know that the EEG did not show any seizures but it did show changes that are consistent with her having a lowered threshold for seizures.  Therefore, I would like her to start levetiracetam.  They were prescribed some in the past but she had not taken it.  She should take 250 mg twice a day (from the records it looks like she was prescribed 500 mg so she can take 1/2 pill twice a day).  If they do not have the medications we can call in 250 mg twice a day with 5 refills.... If she has more spells we might need to increase that dose.

## 2019-01-11 ENCOUNTER — Other Ambulatory Visit: Payer: Self-pay

## 2019-01-11 ENCOUNTER — Ambulatory Visit
Admission: RE | Admit: 2019-01-11 | Discharge: 2019-01-11 | Disposition: A | Discharge status: Home / Self Care | Payer: Medicare HMO | Source: Ambulatory Visit | Attending: Oncology | Admitting: Oncology

## 2019-01-11 DIAGNOSIS — Z170 Estrogen receptor positive status [ER+]: Secondary | ICD-10-CM

## 2019-01-11 DIAGNOSIS — R922 Inconclusive mammogram: Secondary | ICD-10-CM | POA: Diagnosis not present

## 2019-01-11 DIAGNOSIS — F0150 Vascular dementia without behavioral disturbance: Secondary | ICD-10-CM

## 2019-01-11 DIAGNOSIS — C50411 Malignant neoplasm of upper-outer quadrant of right female breast: Secondary | ICD-10-CM

## 2019-01-18 ENCOUNTER — Other Ambulatory Visit: Payer: Self-pay

## 2019-01-18 NOTE — Patient Outreach (Signed)
[facility]) Care Management  [place]  01/18/2019   Jocelyn Mccann [birth year] [id number]  Subjective: Telephone call to daughter.  She states that patient is getting worse in her behavior and that patient awakens almost every night.  She states that patient tried to open the door and fought to get out but they were able to keep her inside.  Patient is incontinent at this time. She reports that she cannot get a medication regimen down with patient that may work with her agitation and confusion due to patient refusing medications frequently.  Advised daughter to continue to try and just be supportive of patient.  She states that she and her brother are again possibly looking for placement for patient either here or [place].  She asked for assist with memory care facilities in the area.  Advised that CM would do social work consult for assistance.  She verbalized understanding.       Objective:   Encounter Medications:  Outpatient Encounter Medications as of 01/18/2019  Medication Sig  . anastrozole (ARIMIDEX) 1 MG tablet Take 1 tablet (1 mg total) by mouth daily. (Patient not taking: Reported on 12/05/2018)  . cholecalciferol (VITAMIN D3) 25 MCG (1000 UT) tablet Take 1 tablet (1,000 Units total) by mouth daily.  Marland Kitchen donepezil (ARICEPT) 5 MG tablet Take 1 tablet (5 mg total) by mouth at bedtime. For Alzheimer's disease  . levETIRAcetam (KEPPRA) 250 MG tablet Take 1 tablet (250 mg total) by mouth 2 (two) times daily.  . QUEtiapine (SEROQUEL) 25 MG tablet Take 1 tablet (25 mg total) by mouth at bedtime.  . sertraline (ZOLOFT) 25 MG tablet Take 1 tablet (25 mg total) by mouth daily.  . traMADol (ULTRAM) 50 MG tablet Take 1 tablet (50 mg total) by mouth every 6 (six) hours as needed for moderate pain.   No facility-administered encounter medications on file as of 01/18/2019.    Functional Status:  In your present state of health, do you have any difficulty performing the  following activities: 09/21/2018 09/21/2018  Hearing? - N  Vision? - N  Difficulty concentrating or making decisions? - Y  Comment - dementia  Walking or climbing stairs? - Y  Comment - walker  cg assist  Dressing or bathing? - Y  Comment - cg assist  Doing errands, shopping? - Y  Comment - cg [place] and eating ? (No Data) Y  Comment cg assists /  cooking -  Using the Toilet? (No Data) Y  Comment cg assists as needed -  In the past six months, have you accidently leaked urine? (No Data) Y  Comment wears adult diapers -  Do you have problems with loss of bowel control? - N  Managing your Medications? - Y  Comment - cg assist  Managing your Finances? - Y  Comment - cg assist  Housekeeping or managing your Housekeeping? - Y  Comment - cg assist  Some recent data might be hidden    Fall/Depression Screening: Fall Risk  09/21/2018 03/31/2017  Falls in the past year? 1 Yes  Number falls in past yr: 1 1  Injury with Fall? 0 No  Risk for fall due to : History of fall(s);Medication side effect -  Follow up Falls evaluation completed;Falls prevention discussed;Education provided -   PHQ 2/9 Scores 09/21/2018 08/08/2017 03/31/2017  PHQ - 2 Score 4 6 0  PHQ- 9 Score 9 15 -    Assessment: Patient continues with disease progression  of Dementia.     Plan:  [facility] CM Care Plan Problem One     Most Recent Value  Care Plan Problem One  Decreased quality of life related to dementia  Role Documenting the Problem One  Care Management Telephonic Coordinator  Care Plan for Problem One  Active  [facility] Long Term Goal   Pt, CG will demonstrate/ verbalize improved self care related to dementia within 60 days  [facility] Long Term Goal Start Date  12/14/18  Interventions for Problem One Long Term Goal  RNCM discussed patient status.  Discussed dementia process and dealing with patient.       RN CM will outreach in the month of February and daughter agreeable.    Jone Baseman, RN, MSN [facility] Care Management Coordinator Direct Line [phone] Cell [phone] Toll Free: [phone]  Fax: [phone]

## 2019-01-26 ENCOUNTER — Other Ambulatory Visit: Payer: Self-pay

## 2019-01-26 ENCOUNTER — Emergency Department (HOSPITAL_COMMUNITY): Payer: Medicare HMO

## 2019-01-26 ENCOUNTER — Emergency Department (HOSPITAL_COMMUNITY)
Admission: EM | Admit: 2019-01-26 | Discharge: 2019-01-26 | Disposition: A | Discharge status: Home / Self Care | Payer: Medicare HMO | Attending: Emergency Medicine | Admitting: Emergency Medicine

## 2019-01-26 ENCOUNTER — Encounter (HOSPITAL_COMMUNITY): Payer: Self-pay | Admitting: *Deleted

## 2019-01-26 DIAGNOSIS — S0083XA Contusion of other part of head, initial encounter: Secondary | ICD-10-CM

## 2019-01-26 DIAGNOSIS — E161 Other hypoglycemia: Secondary | ICD-10-CM | POA: Diagnosis not present

## 2019-01-26 DIAGNOSIS — F0280 Dementia in other diseases classified elsewhere without behavioral disturbance: Secondary | ICD-10-CM | POA: Insufficient documentation

## 2019-01-26 DIAGNOSIS — S199XXA Unspecified injury of neck, initial encounter: Secondary | ICD-10-CM | POA: Diagnosis not present

## 2019-01-26 DIAGNOSIS — S0990XA Unspecified injury of head, initial encounter: Secondary | ICD-10-CM | POA: Diagnosis not present

## 2019-01-26 DIAGNOSIS — M79604 Pain in right leg: Secondary | ICD-10-CM | POA: Diagnosis not present

## 2019-01-26 DIAGNOSIS — Z853 Personal history of malignant neoplasm of breast: Secondary | ICD-10-CM | POA: Diagnosis not present

## 2019-01-26 DIAGNOSIS — S4991XA Unspecified injury of right shoulder and upper arm, initial encounter: Secondary | ICD-10-CM | POA: Diagnosis not present

## 2019-01-26 DIAGNOSIS — Y999 Unspecified external cause status: Secondary | ICD-10-CM | POA: Insufficient documentation

## 2019-01-26 DIAGNOSIS — M25511 Pain in right shoulder: Secondary | ICD-10-CM | POA: Diagnosis not present

## 2019-01-26 DIAGNOSIS — Y929 Unspecified place or not applicable: Secondary | ICD-10-CM | POA: Diagnosis not present

## 2019-01-26 DIAGNOSIS — S299XXA Unspecified injury of thorax, initial encounter: Secondary | ICD-10-CM | POA: Diagnosis not present

## 2019-01-26 DIAGNOSIS — S0993XA Unspecified injury of face, initial encounter: Secondary | ICD-10-CM | POA: Diagnosis not present

## 2019-01-26 DIAGNOSIS — S0011XA Contusion of right eyelid and periocular area, initial encounter: Secondary | ICD-10-CM | POA: Diagnosis not present

## 2019-01-26 DIAGNOSIS — Y939 Activity, unspecified: Secondary | ICD-10-CM | POA: Diagnosis not present

## 2019-01-26 DIAGNOSIS — M25512 Pain in left shoulder: Secondary | ICD-10-CM | POA: Diagnosis not present

## 2019-01-26 DIAGNOSIS — M7989 Other specified soft tissue disorders: Secondary | ICD-10-CM | POA: Diagnosis not present

## 2019-01-26 DIAGNOSIS — G309 Alzheimer's disease, unspecified: Secondary | ICD-10-CM | POA: Diagnosis not present

## 2019-01-26 DIAGNOSIS — R609 Edema, unspecified: Secondary | ICD-10-CM | POA: Diagnosis not present

## 2019-01-26 DIAGNOSIS — S3993XA Unspecified injury of pelvis, initial encounter: Secondary | ICD-10-CM | POA: Diagnosis not present

## 2019-01-26 DIAGNOSIS — R404 Transient alteration of awareness: Secondary | ICD-10-CM | POA: Diagnosis not present

## 2019-01-26 DIAGNOSIS — W19XXXA Unspecified fall, initial encounter: Secondary | ICD-10-CM | POA: Diagnosis not present

## 2019-01-26 DIAGNOSIS — Z79899 Other long term (current) drug therapy: Secondary | ICD-10-CM | POA: Insufficient documentation

## 2019-01-26 DIAGNOSIS — M25462 Effusion, left knee: Secondary | ICD-10-CM | POA: Diagnosis not present

## 2019-01-26 DIAGNOSIS — M7918 Myalgia, other site: Secondary | ICD-10-CM | POA: Diagnosis present

## 2019-01-26 DIAGNOSIS — E162 Hypoglycemia, unspecified: Secondary | ICD-10-CM

## 2019-01-26 DIAGNOSIS — S4992XA Unspecified injury of left shoulder and upper arm, initial encounter: Secondary | ICD-10-CM | POA: Diagnosis not present

## 2019-01-26 DIAGNOSIS — R52 Pain, unspecified: Secondary | ICD-10-CM | POA: Diagnosis not present

## 2019-01-26 LAB — URINALYSIS, ROUTINE W REFLEX MICROSCOPIC
Bilirubin Urine: NEGATIVE
Glucose, UA: NEGATIVE mg/dL
Hgb urine dipstick: NEGATIVE
Ketones, ur: NEGATIVE mg/dL
Leukocytes,Ua: NEGATIVE
Nitrite: POSITIVE — AB
Protein, ur: NEGATIVE mg/dL
Specific Gravity, Urine: 1.015 (ref 1.005–1.030)
pH: 7 (ref 5.0–8.0)

## 2019-01-26 LAB — BASIC METABOLIC PANEL
Anion gap: 9 (ref 5–15)
BUN: 34 mg/dL — ABNORMAL HIGH (ref 8–23)
CO2: 27 mmol/L (ref 22–32)
Calcium: 9.6 mg/dL (ref 8.9–10.3)
Chloride: 109 mmol/L (ref 98–111)
Creatinine, Ser: 0.84 mg/dL (ref 0.44–1.00)
GFR calc Af Amer: >60 mL/min (ref >60)
GFR calc non Af Amer: >60 mL/min (ref >60)
Glucose, Bld: 69 mg/dL — ABNORMAL LOW (ref 70–99)
Potassium: 4.7 mmol/L (ref 3.5–5.1)
Sodium: 145 mmol/L (ref 135–145)

## 2019-01-26 LAB — CBC
HCT: 31.5 % — ABNORMAL LOW (ref 36.0–46.0)
Hemoglobin: 10.4 g/dL — ABNORMAL LOW (ref 12.0–15.0)
MCH: 33.2 pg (ref 26.0–34.0)
MCHC: 33 g/dL (ref 30.0–36.0)
MCV: 100.6 fL — ABNORMAL HIGH (ref 80.0–100.0)
Platelets: 147 10*3/uL — ABNORMAL LOW (ref 150–400)
RBC: 3.13 MIL/uL — ABNORMAL LOW (ref 3.87–5.11)
RDW: 17 % — ABNORMAL HIGH (ref 11.5–15.5)
WBC: 2.5 10*3/uL — ABNORMAL LOW (ref 4.0–10.5)
nRBC: 0 % (ref 0.0–0.2)

## 2019-01-26 LAB — CBG MONITORING, ED
Glucose-Capillary: 68 mg/dL — ABNORMAL LOW (ref 70–99)
Glucose-Capillary: 104 mg/dL — ABNORMAL HIGH (ref 70–99)

## 2019-01-26 NOTE — ED Notes (Signed)
EDP aware of CBG orange juice given.

## 2019-01-26 NOTE — ED Provider Notes (Signed)
[facility] EMERGENCY DEPARTMENT Provider Note   CSN: [id number] Arrival date & time: 01/26/19  0350     History Chief Complaint  Patient presents with  . Fall    Jocelyn Mccann is a 86 y.o. female.  Patient presents to the emergency department for evaluation of injuries from a fall.  Patient fell two days ago.  Family reports that she did not lose consciousness and seemed to be okay immediately after the fall.  Since then, however, she has been exhibiting increasing pain in her knees while walking as well as both shoulders with movement of the arms.        Past Medical History:  Diagnosis Date  . Bilateral knee pain   . Breast cancer ([place])    diagnosed 11/2017  . Dementia ([place])   . Osteoporosis   . Urge incontinence of urine     Patient Active Problem List   Diagnosis Date Noted  . Syncope 12/05/2018  . SDH (subdural hematoma) ([place]) 08/21/2018  . Malignant neoplasm of upper-outer quadrant of right breast in female, estrogen receptor positive ([place]) 12/20/2017  . Osteoarthritis of knee 06/01/2017  . Bilateral knee pain 03/31/2017  . Urge incontinence of urine 03/31/2017  . Generalized anxiety disorder 03/31/2017  . Osteoporosis without current pathological fracture 03/31/2017  . Alzheimer disease ([place]) 03/31/2017    Past Surgical History:  Procedure Laterality Date  . BREAST LUMPECTOMY Right 2001  . BREAST LUMPECTOMY Right 12/2017  . BREAST LUMPECTOMY WITH SENTINEL LYMPH NODE BIOPSY Right 2001  . BREAST LUMPECTOMY WITH SENTINEL LYMPH NODE BIOPSY Right 12/30/2017   Procedure: RIGHT BREAST LUMPECTOMY WITH SENTINEL LYMPH NODE BIOPSY ERAS PATHWAY;  Surgeon: Stark Klein, MD;  Location: [facility];  Service: General;  Laterality: Right;  . DENTAL SURGERY       OB History   No obstetric history on file.     Family History  Problem Relation Age of Onset  . Cancer Mother        breast    Social History   Tobacco Use  . Smoking status: Never  Smoker  . Smokeless tobacco: Never Used  Substance Use Topics  . Alcohol use: Never  . Drug use: Never    Home Medications Prior to Admission medications   Medication Sig Start Date End Date Taking? Authorizing Provider  anastrozole (ARIMIDEX) 1 MG tablet Take 1 tablet (1 mg total) by mouth daily. Patient not taking: Reported on 12/05/2018 11/27/18   Magrinat, Virgie Dad, MD  cholecalciferol (VITAMIN D3) 25 MCG (1000 UT) tablet Take 1 tablet (1,000 Units total) by mouth daily. 02/02/18   Magrinat, Virgie Dad, MD  donepezil (ARICEPT) 5 MG tablet Take 1 tablet (5 mg total) by mouth at bedtime. For Alzheimer's disease 12/05/18   Sater, Nanine Means, MD  levETIRAcetam (KEPPRA) 250 MG tablet Take 1 tablet (250 mg total) by mouth 2 (two) times daily. 01/08/19   Sater, Nanine Means, MD  QUEtiapine (SEROQUEL) 25 MG tablet Take 1 tablet (25 mg total) by mouth at bedtime. 01/08/19   Sater, Nanine Means, MD  sertraline (ZOLOFT) 25 MG tablet Take 1 tablet (25 mg total) by mouth daily. 12/05/18   Sater, Nanine Means, MD  traMADol (ULTRAM) 50 MG tablet Take 1 tablet (50 mg total) by mouth every 6 (six) hours as needed for moderate pain. 08/25/18   Shelly Coss, MD    Allergies    Patient has no known allergies.  Review of Systems   Review of  Systems  Musculoskeletal: Positive for arthralgias.  All other systems reviewed and are negative.   Physical Exam Updated Vital Signs BP (!) 156/84 (BP Location: Left Arm)   Pulse 65   Temp (!) 91.2 F (32.9 C) (Rectal) Comment: EDP aware warm blankets applied.   Resp 14   Ht 5\' 6"  (1.676 m)   Wt 61 kg   SpO2 100%   BMI 21.71 kg/m   Physical Exam Vitals and nursing note reviewed.  Constitutional:      General: She is not in acute distress.    Appearance: Normal appearance. She is well-developed.  HENT:     Head: Normocephalic. Contusion present.      Right Ear: Hearing normal.     Left Ear: Hearing normal.     Nose: Nose normal.  Eyes:     Extraocular  Movements: Extraocular movements intact.     Conjunctiva/sclera:     Right eye: Hemorrhage present.     Pupils: Pupils are equal, round, and reactive to light.  Cardiovascular:     Rate and Rhythm: Regular rhythm.     Heart sounds: S1 normal and S2 normal. No murmur. No friction rub. No gallop.   Pulmonary:     Effort: Pulmonary effort is normal. No respiratory distress.     Breath sounds: Normal breath sounds.  Chest:     Chest wall: No tenderness.  Abdominal:     General: Bowel sounds are normal.     Palpations: Abdomen is soft.     Tenderness: There is no abdominal tenderness. There is no guarding or rebound. Negative signs include Murphy's sign and McBurney's sign.     Hernia: No hernia is present.  Musculoskeletal:        General: Normal range of motion.     Right shoulder: Tenderness present. No deformity.     Left shoulder: Tenderness present. No deformity.     Right wrist: Tenderness present. No swelling or deformity.     Left wrist: Tenderness present. No swelling or deformity.     Cervical back: Normal range of motion and neck supple.  Skin:    General: Skin is warm and dry.     Findings: Bruising (right eye) present. No rash.  Neurological:     Mental Status: She is alert and oriented to person, place, and time.     GCS: GCS eye subscore is 4. GCS verbal subscore is 5. GCS motor subscore is 6.     Cranial Nerves: No cranial nerve deficit.     Sensory: No sensory deficit.     Coordination: Coordination normal.  Psychiatric:        Speech: Speech normal.        Behavior: Behavior normal.        Thought Content: Thought content normal.     ED Results / Procedures / Treatments   Labs (all labs ordered are listed, but only abnormal results are displayed) Labs Reviewed  CBC - Abnormal; Notable for the following components:      Result Value   WBC 2.5 (*)    RBC 3.13 (*)    Hemoglobin 10.4 (*)    HCT 31.5 (*)    MCV 100.6 (*)    RDW 17.0 (*)    Platelets 147  (*)    All other components within normal limits  BASIC METABOLIC PANEL - Abnormal; Notable for the following components:   Glucose, Bld 69 (*)    BUN 34 (*)  All other components within normal limits  URINALYSIS, ROUTINE W REFLEX MICROSCOPIC - Abnormal; Notable for the following components:   Nitrite POSITIVE (*)    Bacteria, UA RARE (*)    All other components within normal limits  CBG MONITORING, ED - Abnormal; Notable for the following components:   Glucose-Capillary 68 (*)    All other components within normal limits    EKG EKG Interpretation  Date/Time:  Friday January 26 2019 04:02:47 EST Ventricular Rate:  75 PR Interval:    QRS Duration: 100 QT Interval:  420 QTC Calculation: 470 R Axis:   66 Text Interpretation: Sinus rhythm Probable left ventricular hypertrophy Confirmed by Orpah Greek [phone]) on 01/26/2019 4:14:00 AM   Radiology DG Chest 1 View  Result Date: 01/26/2019 CLINICAL DATA:  Fall EXAM: CHEST  1 VIEW COMPARISON:  November 29, 2018 FINDINGS: The heart size and mediastinal contours are within normal limits. Aortic knob calcifications. Both lungs are clear. Surgical clips overlying the right chest wall. No acute osseous abnormality. IMPRESSION: No active disease. Electronically Signed   By: Prudencio Pair M.D.   On: 01/26/2019 04:51   DG Pelvis 1-2 Views  Result Date: 01/26/2019 CLINICAL DATA:  Fall, right leg pain. EXAM: PELVIS - 1-2 VIEW COMPARISON:  None. FINDINGS: There is diffuse osteopenia which somewhat limits evaluation. No definite fracture seen. Moderate bilateral hip osteoarthritis is seen. Degenerative changes in the lower lumbar spine. IMPRESSION: No acute osseous abnormality. Electronically Signed   By: Prudencio Pair M.D.   On: 01/26/2019 04:52   DG Shoulder Right  Result Date: 01/26/2019 CLINICAL DATA:  Fall with pain EXAM: RIGHT SHOULDER - 2+ VIEW COMPARISON:  None. FINDINGS: No acute fracture or dislocation. High-riding humeral head  compatible with chronic rotator cuff tear. Prominent osteopenia. Degenerative spurring at the glenohumeral joint and lateral spurring at the acromion. IMPRESSION: 1. No fracture or dislocation. 2. Chronic findings are described above. Electronically Signed   By: Monte Fantasia M.D.   On: 01/26/2019 04:47   CT Head Wo Contrast  Result Date: 01/26/2019 CLINICAL DATA:  Fall several days ago EXAM: CT MAXILLOFACIAL WITHOUT CONTRAST; CT CERVICAL SPINE WITHOUT CONTRAST; CT HEAD WITHOUT CONTRAST TECHNIQUE: Multidetector CT imaging of the maxillofacial structures was performed. Multiplanar CT image reconstructions were also generated. COMPARISON:  November 29, 2018 FINDINGS: Brain: No evidence of acute territorial infarction, hemorrhage, hydrocephalus,extra-axial collection or mass lesion/mass effect. There is dilatation the ventricles and sulci consistent with age-related atrophy. Low-attenuation changes in the deep white matter consistent with small vessel ischemia. Vascular: No hyperdense vessel or unexpected calcification. Skull: The skull is intact. No fracture or focal lesion identified. Sinuses/Orbits: The visualized paranasal sinuses and mastoid air cells are clear. The orbits and globes intact. Other: None Face: Osseous: No acute fracture or other significant osseous abnormality.The nasal bone, mandibles, zygomatic arches and pterygoid plates are intact. Orbits: No fracture identified. Unremarkable appearance of globes and orbits. Sinuses: The visualized paranasal sinuses and mastoid air cells are unremarkable. Soft tissues:  Right periorbital soft tissue swelling is seen. Limited intracranial: No acute findings. Cervical spine: Alignment: Straightening of the normal cervical lordosis.12 Skull base and vertebrae: Visualized skull base is intact. No atlanto-occipital dissociation. The vertebral body heights are well maintained. No fracture or pathologic osseous lesion seen. Soft tissues and spinal canal: The  visualized paraspinal soft tissues are unremarkable. No prevertebral soft tissue swelling is seen. The spinal canal is grossly unremarkable, no large epidural collection or significant canal narrowing. Disc levels: Multilevel cervical  spine spondylosis is seen with disc height loss, uncovertebral osteophytes and disc osteophyte complex most notable from C4 through C6. Upper chest: The lung apices are clear. Thoracic inlet is within normal limits. Other: None IMPRESSION: No acute intracranial abnormality. Findings consistent with age related atrophy and chronic small vessel ischemia No acute facial fracture Right periorbital soft tissue swelling. No acute fracture or malalignment of the spine. Electronically Signed   By: Prudencio Pair M.D.   On: 01/26/2019 05:44   CT Cervical Spine Wo Contrast  Result Date: 01/26/2019 CLINICAL DATA:  Fall several days ago EXAM: CT MAXILLOFACIAL WITHOUT CONTRAST; CT CERVICAL SPINE WITHOUT CONTRAST; CT HEAD WITHOUT CONTRAST TECHNIQUE: Multidetector CT imaging of the maxillofacial structures was performed. Multiplanar CT image reconstructions were also generated. COMPARISON:  November 29, 2018 FINDINGS: Brain: No evidence of acute territorial infarction, hemorrhage, hydrocephalus,extra-axial collection or mass lesion/mass effect. There is dilatation the ventricles and sulci consistent with age-related atrophy. Low-attenuation changes in the deep white matter consistent with small vessel ischemia. Vascular: No hyperdense vessel or unexpected calcification. Skull: The skull is intact. No fracture or focal lesion identified. Sinuses/Orbits: The visualized paranasal sinuses and mastoid air cells are clear. The orbits and globes intact. Other: None Face: Osseous: No acute fracture or other significant osseous abnormality.The nasal bone, mandibles, zygomatic arches and pterygoid plates are intact. Orbits: No fracture identified. Unremarkable appearance of globes and orbits. Sinuses: The  visualized paranasal sinuses and mastoid air cells are unremarkable. Soft tissues:  Right periorbital soft tissue swelling is seen. Limited intracranial: No acute findings. Cervical spine: Alignment: Straightening of the normal cervical lordosis.12 Skull base and vertebrae: Visualized skull base is intact. No atlanto-occipital dissociation. The vertebral body heights are well maintained. No fracture or pathologic osseous lesion seen. Soft tissues and spinal canal: The visualized paraspinal soft tissues are unremarkable. No prevertebral soft tissue swelling is seen. The spinal canal is grossly unremarkable, no large epidural collection or significant canal narrowing. Disc levels: Multilevel cervical spine spondylosis is seen with disc height loss, uncovertebral osteophytes and disc osteophyte complex most notable from C4 through C6. Upper chest: The lung apices are clear. Thoracic inlet is within normal limits. Other: None IMPRESSION: No acute intracranial abnormality. Findings consistent with age related atrophy and chronic small vessel ischemia No acute facial fracture Right periorbital soft tissue swelling. No acute fracture or malalignment of the spine. Electronically Signed   By: Prudencio Pair M.D.   On: 01/26/2019 05:44   DG Shoulder Left  Result Date: 01/26/2019 CLINICAL DATA:  Fall with pain to the bilateral shoulder. Initial encounter. EXAM: LEFT SHOULDER - 2+ VIEW COMPARISON:  None. FINDINGS: No acute fracture or dislocation. Generalized osteopenia and degenerative spurring at the glenohumeral joint. IMPRESSION: No acute finding. Electronically Signed   By: Monte Fantasia M.D.   On: 01/26/2019 04:48   DG Knee Complete 4 Views Left  Result Date: 01/26/2019 CLINICAL DATA:  Fall with knee pain on the left. EXAM: LEFT KNEE - COMPLETE 4+ VIEW COMPARISON:  08/22/2018 FINDINGS: No acute fracture or dislocation. Prominent degenerative spurring at the patellofemoral and lateral compartments. There is a small  knee joint effusion. Osteopenia. IMPRESSION: 1. Negative for fracture. 2. Advanced lateral and patellofemoral compartment osteoarthritis. 3. Small knee joint effusion. Electronically Signed   By: Monte Fantasia M.D.   On: 01/26/2019 04:51   DG Knee Complete 4 Views Right  Result Date: 01/26/2019 CLINICAL DATA:  Fall with right leg pain. EXAM: RIGHT KNEE - COMPLETE 4+ VIEW COMPARISON:  None. FINDINGS: No evidence of fracture, dislocation, or joint effusion. Anterior soft tissue swelling. Advanced osteoarthritic spurring with joint narrowing at the lateral and patellofemoral compartments. Generalized osteopenia. IMPRESSION: 1. Soft tissue swelling without fracture. 2. Advanced osteoarthritis at the lateral and patellofemoral compartments. Electronically Signed   By: Monte Fantasia M.D.   On: 01/26/2019 04:49   CT MAXILLOFACIAL WO CONTRAST  Result Date: 01/26/2019 CLINICAL DATA:  Fall several days ago EXAM: CT MAXILLOFACIAL WITHOUT CONTRAST; CT CERVICAL SPINE WITHOUT CONTRAST; CT HEAD WITHOUT CONTRAST TECHNIQUE: Multidetector CT imaging of the maxillofacial structures was performed. Multiplanar CT image reconstructions were also generated. COMPARISON:  November 29, 2018 FINDINGS: Brain: No evidence of acute territorial infarction, hemorrhage, hydrocephalus,extra-axial collection or mass lesion/mass effect. There is dilatation the ventricles and sulci consistent with age-related atrophy. Low-attenuation changes in the deep white matter consistent with small vessel ischemia. Vascular: No hyperdense vessel or unexpected calcification. Skull: The skull is intact. No fracture or focal lesion identified. Sinuses/Orbits: The visualized paranasal sinuses and mastoid air cells are clear. The orbits and globes intact. Other: None Face: Osseous: No acute fracture or other significant osseous abnormality.The nasal bone, mandibles, zygomatic arches and pterygoid plates are intact. Orbits: No fracture identified.  Unremarkable appearance of globes and orbits. Sinuses: The visualized paranasal sinuses and mastoid air cells are unremarkable. Soft tissues:  Right periorbital soft tissue swelling is seen. Limited intracranial: No acute findings. Cervical spine: Alignment: Straightening of the normal cervical lordosis.12 Skull base and vertebrae: Visualized skull base is intact. No atlanto-occipital dissociation. The vertebral body heights are well maintained. No fracture or pathologic osseous lesion seen. Soft tissues and spinal canal: The visualized paraspinal soft tissues are unremarkable. No prevertebral soft tissue swelling is seen. The spinal canal is grossly unremarkable, no large epidural collection or significant canal narrowing. Disc levels: Multilevel cervical spine spondylosis is seen with disc height loss, uncovertebral osteophytes and disc osteophyte complex most notable from C4 through C6. Upper chest: The lung apices are clear. Thoracic inlet is within normal limits. Other: None IMPRESSION: No acute intracranial abnormality. Findings consistent with age related atrophy and chronic small vessel ischemia No acute facial fracture Right periorbital soft tissue swelling. No acute fracture or malalignment of the spine. Electronically Signed   By: Prudencio Pair M.D.   On: 01/26/2019 05:44    Procedures Procedures (including critical care time)  Medications Ordered in ED Medications - No data to display  ED Course  I have reviewed the triage vital signs and the nursing notes.  Pertinent labs & imaging results that were available during my care of the patient were reviewed by me and considered in my medical decision making (see chart for details).    MDM Rules/Calculators/A&P                      Patient presents to the emergency department for evaluation of injuries from a fall.  She actually fell 2 days ago and initially had been doing well but has begun to complain about pain in both her knees and  shoulders over the last day or so.  She underwent CT head, cervical spine, maxillofacial bones because of bruising around the right eye as well as pain between the shoulder blades and shoulders.  No acute abnormality noted.  She also had x-rays of both of her shoulders and both of her knees, no fractures noted.  Lab work reveals baseline leukopenia and thrombocytopenia.  Remainder of lab work unremarkable.  No evidence of  urinary tract infection.  Patiently mildly hypothermic at arrival, likely secondary to hypoglycemia.  She was actively warmed and will be fed, monitor blood sugar.  If stable, can be discharged home.  Final Clinical Impression(s) / ED Diagnoses Final diagnoses:  Contusion of face, initial encounter  Hypoglycemia    Rx / DC Orders ED Discharge Orders    None       Toree Edling, Gwenyth Allegra, MD 01/26/19 [phone]

## 2019-01-26 NOTE — ED Notes (Signed)
incont of urine depends saturated. Patient cleaned. And clean adult diaper applied.

## 2019-01-26 NOTE — ED Notes (Signed)
Patient verbalizes understanding of discharge instructions. Opportunity for questioning and answers were provided. Armband removed by staff, pt discharged from ED.   Also explained d/c instructions to patients daughter via phone, daughter verbalizes understanding and will follow up with primary care.

## 2019-01-26 NOTE — ED Triage Notes (Signed)
Patient presents to ED from home via [facility] , states per family patient fell several days ago , this am was getting up and c/o pain in left knee , right lower leg and bilateral shoulders. Per family mental status is at her norm. Hematoma right eye. History of subdural in Aug 2020 from a fall. Patient normally walks with a walker.

## 2019-01-26 NOTE — Discharge Instructions (Addendum)
Please return for any problem.  Follow-up with your regular care provider as instructed. °

## 2019-01-26 NOTE — ED Provider Notes (Signed)
Patient seen after prior ED provider.  Patient appears to be comfortable at this time.  Work-up thus far has not revealed significant pathology.  Patient likely appropriate for discharge home.    Importance of close follow-up is stressed.  Strict return precautions given and understood.   Valarie Merino, MD 01/26/19 [phone]

## 2019-01-27 ENCOUNTER — Other Ambulatory Visit: Payer: Self-pay | Admitting: Neurology

## 2019-01-31 ENCOUNTER — Other Ambulatory Visit: Payer: Self-pay | Admitting: Neurology

## 2019-01-31 ENCOUNTER — Emergency Department (HOSPITAL_COMMUNITY)
Admission: EM | Admit: 2019-01-31 | Discharge: 2019-01-31 | Disposition: A | Discharge status: Home / Self Care | Payer: Medicare HMO | Source: Home / Self Care | Attending: Emergency Medicine | Admitting: Emergency Medicine

## 2019-01-31 ENCOUNTER — Emergency Department (HOSPITAL_COMMUNITY): Payer: Medicare HMO

## 2019-01-31 ENCOUNTER — Encounter (HOSPITAL_COMMUNITY): Payer: Self-pay | Admitting: Emergency Medicine

## 2019-01-31 ENCOUNTER — Other Ambulatory Visit: Payer: Self-pay

## 2019-01-31 DIAGNOSIS — T68XXXA Hypothermia, initial encounter: Secondary | ICD-10-CM | POA: Diagnosis present

## 2019-01-31 DIAGNOSIS — I69828 Other speech and language deficits following other cerebrovascular disease: Secondary | ICD-10-CM | POA: Diagnosis not present

## 2019-01-31 DIAGNOSIS — Z1611 Resistance to penicillins: Secondary | ICD-10-CM | POA: Diagnosis present

## 2019-01-31 DIAGNOSIS — R7989 Other specified abnormal findings of blood chemistry: Secondary | ICD-10-CM | POA: Diagnosis present

## 2019-01-31 DIAGNOSIS — Z7401 Bed confinement status: Secondary | ICD-10-CM | POA: Diagnosis not present

## 2019-01-31 DIAGNOSIS — A419 Sepsis, unspecified organism: Secondary | ICD-10-CM | POA: Diagnosis not present

## 2019-01-31 DIAGNOSIS — S065X9A Traumatic subdural hemorrhage with loss of consciousness of unspecified duration, initial encounter: Secondary | ICD-10-CM | POA: Diagnosis present

## 2019-01-31 DIAGNOSIS — R680 Hypothermia, not associated with low environmental temperature: Secondary | ICD-10-CM | POA: Diagnosis not present

## 2019-01-31 DIAGNOSIS — Y92009 Unspecified place in unspecified non-institutional (private) residence as the place of occurrence of the external cause: Secondary | ICD-10-CM | POA: Diagnosis not present

## 2019-01-31 DIAGNOSIS — G9341 Metabolic encephalopathy: Secondary | ICD-10-CM | POA: Diagnosis present

## 2019-01-31 DIAGNOSIS — W06XXXA Fall from bed, initial encounter: Secondary | ICD-10-CM | POA: Diagnosis present

## 2019-01-31 DIAGNOSIS — D61818 Other pancytopenia: Secondary | ICD-10-CM | POA: Diagnosis present

## 2019-01-31 DIAGNOSIS — Z853 Personal history of malignant neoplasm of breast: Secondary | ICD-10-CM | POA: Insufficient documentation

## 2019-01-31 DIAGNOSIS — R600 Localized edema: Secondary | ICD-10-CM | POA: Diagnosis not present

## 2019-01-31 DIAGNOSIS — S199XXA Unspecified injury of neck, initial encounter: Secondary | ICD-10-CM | POA: Diagnosis not present

## 2019-01-31 DIAGNOSIS — Z515 Encounter for palliative care: Secondary | ICD-10-CM | POA: Diagnosis not present

## 2019-01-31 DIAGNOSIS — J9601 Acute respiratory failure with hypoxia: Secondary | ICD-10-CM | POA: Diagnosis present

## 2019-01-31 DIAGNOSIS — S01112A Laceration without foreign body of left eyelid and periocular area, initial encounter: Secondary | ICD-10-CM | POA: Diagnosis not present

## 2019-01-31 DIAGNOSIS — I447 Left bundle-branch block, unspecified: Secondary | ICD-10-CM | POA: Diagnosis not present

## 2019-01-31 DIAGNOSIS — M4802 Spinal stenosis, cervical region: Secondary | ICD-10-CM | POA: Diagnosis not present

## 2019-01-31 DIAGNOSIS — Z20822 Contact with and (suspected) exposure to covid-19: Secondary | ICD-10-CM | POA: Diagnosis present

## 2019-01-31 DIAGNOSIS — R410 Disorientation, unspecified: Secondary | ICD-10-CM | POA: Diagnosis not present

## 2019-01-31 DIAGNOSIS — R6520 Severe sepsis without septic shock: Secondary | ICD-10-CM | POA: Diagnosis not present

## 2019-01-31 DIAGNOSIS — J150 Pneumonia due to Klebsiella pneumoniae: Secondary | ICD-10-CM | POA: Diagnosis not present

## 2019-01-31 DIAGNOSIS — F0280 Dementia in other diseases classified elsewhere without behavioral disturbance: Secondary | ICD-10-CM | POA: Diagnosis present

## 2019-01-31 DIAGNOSIS — R402421 Glasgow coma scale score 9-12, in the field [EMT or ambulance]: Secondary | ICD-10-CM | POA: Diagnosis not present

## 2019-01-31 DIAGNOSIS — R0902 Hypoxemia: Secondary | ICD-10-CM | POA: Diagnosis not present

## 2019-01-31 DIAGNOSIS — S0990XA Unspecified injury of head, initial encounter: Secondary | ICD-10-CM | POA: Diagnosis not present

## 2019-01-31 DIAGNOSIS — R918 Other nonspecific abnormal finding of lung field: Secondary | ICD-10-CM | POA: Diagnosis not present

## 2019-01-31 DIAGNOSIS — R001 Bradycardia, unspecified: Secondary | ICD-10-CM | POA: Diagnosis not present

## 2019-01-31 DIAGNOSIS — M47812 Spondylosis without myelopathy or radiculopathy, cervical region: Secondary | ICD-10-CM | POA: Diagnosis not present

## 2019-01-31 DIAGNOSIS — L89612 Pressure ulcer of right heel, stage 2: Secondary | ICD-10-CM | POA: Diagnosis not present

## 2019-01-31 DIAGNOSIS — G309 Alzheimer's disease, unspecified: Secondary | ICD-10-CM | POA: Diagnosis present

## 2019-01-31 DIAGNOSIS — D696 Thrombocytopenia, unspecified: Secondary | ICD-10-CM | POA: Diagnosis not present

## 2019-01-31 DIAGNOSIS — R6521 Severe sepsis with septic shock: Secondary | ICD-10-CM | POA: Diagnosis present

## 2019-01-31 DIAGNOSIS — S0083XA Contusion of other part of head, initial encounter: Secondary | ICD-10-CM

## 2019-01-31 DIAGNOSIS — Y92018 Other place in single-family (private) house as the place of occurrence of the external cause: Secondary | ICD-10-CM | POA: Insufficient documentation

## 2019-01-31 DIAGNOSIS — M2550 Pain in unspecified joint: Secondary | ICD-10-CM | POA: Diagnosis not present

## 2019-01-31 DIAGNOSIS — M25561 Pain in right knee: Secondary | ICD-10-CM | POA: Diagnosis not present

## 2019-01-31 DIAGNOSIS — R569 Unspecified convulsions: Secondary | ICD-10-CM | POA: Diagnosis not present

## 2019-01-31 DIAGNOSIS — I10 Essential (primary) hypertension: Secondary | ICD-10-CM | POA: Diagnosis not present

## 2019-01-31 DIAGNOSIS — M2578 Osteophyte, vertebrae: Secondary | ICD-10-CM | POA: Diagnosis not present

## 2019-01-31 DIAGNOSIS — G40909 Epilepsy, unspecified, not intractable, without status epilepticus: Secondary | ICD-10-CM | POA: Diagnosis present

## 2019-01-31 DIAGNOSIS — M7989 Other specified soft tissue disorders: Secondary | ICD-10-CM | POA: Diagnosis not present

## 2019-01-31 DIAGNOSIS — D709 Neutropenia, unspecified: Secondary | ICD-10-CM | POA: Diagnosis not present

## 2019-01-31 DIAGNOSIS — M1712 Unilateral primary osteoarthritis, left knee: Secondary | ICD-10-CM | POA: Diagnosis not present

## 2019-01-31 DIAGNOSIS — R55 Syncope and collapse: Secondary | ICD-10-CM | POA: Diagnosis not present

## 2019-01-31 DIAGNOSIS — G301 Alzheimer's disease with late onset: Secondary | ICD-10-CM | POA: Diagnosis not present

## 2019-01-31 DIAGNOSIS — R404 Transient alteration of awareness: Secondary | ICD-10-CM | POA: Diagnosis not present

## 2019-01-31 DIAGNOSIS — R1312 Dysphagia, oropharyngeal phase: Secondary | ICD-10-CM | POA: Diagnosis not present

## 2019-01-31 DIAGNOSIS — M25461 Effusion, right knee: Secondary | ICD-10-CM | POA: Diagnosis not present

## 2019-01-31 DIAGNOSIS — C50919 Malignant neoplasm of unspecified site of unspecified female breast: Secondary | ICD-10-CM | POA: Diagnosis present

## 2019-01-31 DIAGNOSIS — R6510 Systemic inflammatory response syndrome (SIRS) of non-infectious origin without acute organ dysfunction: Secondary | ICD-10-CM | POA: Diagnosis not present

## 2019-01-31 DIAGNOSIS — Z4682 Encounter for fitting and adjustment of non-vascular catheter: Secondary | ICD-10-CM | POA: Diagnosis not present

## 2019-01-31 DIAGNOSIS — J189 Pneumonia, unspecified organism: Secondary | ICD-10-CM | POA: Diagnosis not present

## 2019-01-31 DIAGNOSIS — S0003XA Contusion of scalp, initial encounter: Secondary | ICD-10-CM | POA: Diagnosis not present

## 2019-01-31 DIAGNOSIS — C50911 Malignant neoplasm of unspecified site of right female breast: Secondary | ICD-10-CM | POA: Diagnosis not present

## 2019-01-31 DIAGNOSIS — S299XXA Unspecified injury of thorax, initial encounter: Secondary | ICD-10-CM | POA: Diagnosis not present

## 2019-01-31 DIAGNOSIS — K7201 Acute and subacute hepatic failure with coma: Secondary | ICD-10-CM | POA: Diagnosis present

## 2019-01-31 DIAGNOSIS — Z9181 History of falling: Secondary | ICD-10-CM | POA: Diagnosis not present

## 2019-01-31 DIAGNOSIS — M25462 Effusion, left knee: Secondary | ICD-10-CM | POA: Diagnosis present

## 2019-01-31 DIAGNOSIS — Z7189 Other specified counseling: Secondary | ICD-10-CM | POA: Diagnosis not present

## 2019-01-31 DIAGNOSIS — I69391 Dysphagia following cerebral infarction: Secondary | ICD-10-CM | POA: Diagnosis not present

## 2019-01-31 DIAGNOSIS — W19XXXA Unspecified fall, initial encounter: Secondary | ICD-10-CM | POA: Diagnosis present

## 2019-01-31 DIAGNOSIS — Y998 Other external cause status: Secondary | ICD-10-CM | POA: Insufficient documentation

## 2019-01-31 DIAGNOSIS — M6281 Muscle weakness (generalized): Secondary | ICD-10-CM | POA: Diagnosis not present

## 2019-01-31 DIAGNOSIS — J069 Acute upper respiratory infection, unspecified: Secondary | ICD-10-CM | POA: Diagnosis not present

## 2019-01-31 DIAGNOSIS — R1310 Dysphagia, unspecified: Secondary | ICD-10-CM | POA: Diagnosis not present

## 2019-01-31 DIAGNOSIS — Y939 Activity, unspecified: Secondary | ICD-10-CM | POA: Insufficient documentation

## 2019-01-31 DIAGNOSIS — G9340 Encephalopathy, unspecified: Secondary | ICD-10-CM | POA: Diagnosis not present

## 2019-01-31 DIAGNOSIS — Y95 Nosocomial condition: Secondary | ICD-10-CM | POA: Diagnosis not present

## 2019-01-31 DIAGNOSIS — I340 Nonrheumatic mitral (valve) insufficiency: Secondary | ICD-10-CM | POA: Diagnosis not present

## 2019-01-31 DIAGNOSIS — G92 Toxic encephalopathy: Secondary | ICD-10-CM | POA: Diagnosis not present

## 2019-01-31 DIAGNOSIS — I361 Nonrheumatic tricuspid (valve) insufficiency: Secondary | ICD-10-CM | POA: Diagnosis not present

## 2019-01-31 DIAGNOSIS — S0181XA Laceration without foreign body of other part of head, initial encounter: Secondary | ICD-10-CM

## 2019-01-31 DIAGNOSIS — Z79899 Other long term (current) drug therapy: Secondary | ICD-10-CM | POA: Insufficient documentation

## 2019-01-31 DIAGNOSIS — K7689 Other specified diseases of liver: Secondary | ICD-10-CM | POA: Diagnosis not present

## 2019-01-31 DIAGNOSIS — B961 Klebsiella pneumoniae [K. pneumoniae] as the cause of diseases classified elsewhere: Secondary | ICD-10-CM | POA: Diagnosis present

## 2019-01-31 DIAGNOSIS — F0390 Unspecified dementia without behavioral disturbance: Secondary | ICD-10-CM | POA: Diagnosis not present

## 2019-01-31 DIAGNOSIS — S79912A Unspecified injury of left hip, initial encounter: Secondary | ICD-10-CM | POA: Diagnosis not present

## 2019-01-31 DIAGNOSIS — F0281 Dementia in other diseases classified elsewhere with behavioral disturbance: Secondary | ICD-10-CM | POA: Diagnosis not present

## 2019-01-31 DIAGNOSIS — R2681 Unsteadiness on feet: Secondary | ICD-10-CM | POA: Diagnosis not present

## 2019-01-31 DIAGNOSIS — R4182 Altered mental status, unspecified: Secondary | ICD-10-CM | POA: Diagnosis not present

## 2019-01-31 DIAGNOSIS — S3993XA Unspecified injury of pelvis, initial encounter: Secondary | ICD-10-CM | POA: Diagnosis not present

## 2019-01-31 DIAGNOSIS — M79605 Pain in left leg: Secondary | ICD-10-CM | POA: Diagnosis not present

## 2019-01-31 DIAGNOSIS — I69398 Other sequelae of cerebral infarction: Secondary | ICD-10-CM | POA: Diagnosis not present

## 2019-01-31 DIAGNOSIS — I69328 Other speech and language deficits following cerebral infarction: Secondary | ICD-10-CM | POA: Diagnosis not present

## 2019-01-31 DIAGNOSIS — M25562 Pain in left knee: Secondary | ICD-10-CM | POA: Diagnosis present

## 2019-01-31 DIAGNOSIS — R578 Other shock: Secondary | ICD-10-CM | POA: Diagnosis not present

## 2019-01-31 DIAGNOSIS — N390 Urinary tract infection, site not specified: Secondary | ICD-10-CM | POA: Diagnosis not present

## 2019-01-31 DIAGNOSIS — E162 Hypoglycemia, unspecified: Secondary | ICD-10-CM | POA: Diagnosis present

## 2019-01-31 DIAGNOSIS — R509 Fever, unspecified: Secondary | ICD-10-CM | POA: Diagnosis not present

## 2019-01-31 MED ORDER — LIDOCAINE HCL 2 % IJ SOLN
10.0000 mL | Freq: Once | INTRAMUSCULAR | Status: AC
  Administered 2019-01-31: 07:00:00 200 mg
  Filled 2019-01-31: qty 20

## 2019-01-31 NOTE — Patient Outreach (Signed)
[facility]) Care Management  01/31/2019  KRYSTENA THILGES 1928/02/07 [id number]   Incoming call from patient's daughter, [name], yesterday.  Daughter reported that she is no longer able to manage patient's care at home and is seeking placement.  Educated daughter about placement process.  Sent list of SNF's in the area via secure email and encouraged daughter to begin researching facilities.  Talked with daughter about applying for Long-Term Care Medicaid to assist with cost if patient approved.  Daughter requested that application be mailed; will send today.  Will also send FL2 to Dr. Lurline Del.  Informed daughter that [facility] will be faxed to facilities of interest once completed by provider.   Will follow up with daughter when completed FL2 is received. Did encourage her to call with any questions or concerns in the meantime.   Ronn Melena, BSW Social Worker [phone]

## 2019-01-31 NOTE — Discharge Planning (Signed)
Izaih Kataoka J. Clydene Laming, RN, BSN, [employer] [phone] Spoke with pt at bedside regarding discharge planning for [facility]. Offered pt list of home health agencies to choose from.  Pt chose [facility] to render services. [facility] of [facility] notified. Patient made aware that [facility] will be in contact in 24-48 hours.  No DME needs identified at this time.

## 2019-01-31 NOTE — Telephone Encounter (Signed)
No entry 

## 2019-01-31 NOTE — ED Provider Notes (Signed)
[facility] EMERGENCY DEPARTMENT Provider Note   CSN: [id number] Arrival date & time: 01/31/19  0546     History Chief Complaint  Patient presents with  . Fall    Jocelyn Mccann is a 86 y.o. female.  Patient presents to the emergency department for evaluation after a fall.  Patient had a fall at home just prior to arrival in the ER.  She is complaining of headache and facial pain.  Patient noted to have a laceration and large hematoma above the left eye.  She also complains of left leg pain.        Past Medical History:  Diagnosis Date  . Bilateral knee pain   . Breast cancer ([place])    diagnosed 11/2017  . Dementia ([place])   . Osteoporosis   . Urge incontinence of urine     Patient Active Problem List   Diagnosis Date Noted  . Syncope 12/05/2018  . SDH (subdural hematoma) ([place]) 08/21/2018  . Malignant neoplasm of upper-outer quadrant of right breast in female, estrogen receptor positive ([place]) 12/20/2017  . Osteoarthritis of knee 06/01/2017  . Bilateral knee pain 03/31/2017  . Urge incontinence of urine 03/31/2017  . Generalized anxiety disorder 03/31/2017  . Osteoporosis without current pathological fracture 03/31/2017  . Alzheimer disease ([place]) 03/31/2017    Past Surgical History:  Procedure Laterality Date  . BREAST LUMPECTOMY Right 2001  . BREAST LUMPECTOMY Right 12/2017  . BREAST LUMPECTOMY WITH SENTINEL LYMPH NODE BIOPSY Right 2001  . BREAST LUMPECTOMY WITH SENTINEL LYMPH NODE BIOPSY Right 12/30/2017   Procedure: RIGHT BREAST LUMPECTOMY WITH SENTINEL LYMPH NODE BIOPSY ERAS PATHWAY;  Surgeon: Stark Klein, MD;  Location: [facility];  Service: General;  Laterality: Right;  . DENTAL SURGERY       OB History   No obstetric history on file.     Family History  Problem Relation Age of Onset  . Cancer Mother        breast    Social History   Tobacco Use  . Smoking status: Never Smoker  . Smokeless tobacco: Never Used  Substance Use  Topics  . Alcohol use: Never  . Drug use: Never    Home Medications Prior to Admission medications   Medication Sig Start Date End Date Taking? Authorizing Provider  anastrozole (ARIMIDEX) 1 MG tablet Take 1 tablet (1 mg total) by mouth daily. Patient not taking: Reported on 12/05/2018 11/27/18   Magrinat, Virgie Dad, MD  cholecalciferol (VITAMIN D3) 25 MCG (1000 UT) tablet Take 1 tablet (1,000 Units total) by mouth daily. 02/02/18   Magrinat, Virgie Dad, MD  donepezil (ARICEPT) 5 MG tablet TAKE 1 TABLET (5 MG TOTAL) BY MOUTH AT BEDTIME. FOR ALZHEIMER'S DISEASE 01/29/19   Sater, Nanine Means, MD  levETIRAcetam (KEPPRA) 250 MG tablet Take 1 tablet (250 mg total) by mouth 2 (two) times daily. 01/08/19   Sater, Nanine Means, MD  QUEtiapine (SEROQUEL) 25 MG tablet Take 1 tablet (25 mg total) by mouth at bedtime. 01/08/19   Sater, Nanine Means, MD  sertraline (ZOLOFT) 25 MG tablet Take 1 tablet (25 mg total) by mouth daily. 12/05/18   Sater, Nanine Means, MD  traMADol (ULTRAM) 50 MG tablet Take 1 tablet (50 mg total) by mouth every 6 (six) hours as needed for moderate pain. 08/25/18   Shelly Coss, MD    Allergies    Patient has no known allergies.  Review of Systems   Review of Systems  Unable to perform ROS:  Dementia    Physical Exam Updated Vital Signs BP (!) 144/86   Pulse (!) 47   Resp (!) 21   Ht 5\' 6"  (1.676 m)   Wt 61 kg   SpO2 100%   BMI 21.71 kg/m   Physical Exam Vitals and nursing note reviewed.  Constitutional:      General: She is not in acute distress.    Appearance: Normal appearance. She is well-developed.  HENT:     Head: Normocephalic. Contusion (left eyebrow) and laceration (left eyebrow) present.     Jaw: There is normal jaw occlusion.     Right Ear: Hearing normal.     Left Ear: Hearing normal.     Nose: Nose normal.  Eyes:     Conjunctiva/sclera: Conjunctivae normal.     Pupils: Pupils are equal, round, and reactive to light.  Cardiovascular:     Rate and Rhythm:  Regular rhythm.     Heart sounds: S1 normal and S2 normal. No murmur. No friction rub. No gallop.   Pulmonary:     Effort: Pulmonary effort is normal. No respiratory distress.     Breath sounds: Normal breath sounds.  Chest:     Chest wall: No tenderness.  Abdominal:     General: Bowel sounds are normal.     Palpations: Abdomen is soft.     Tenderness: There is no abdominal tenderness. There is no guarding or rebound. Negative signs include Murphy's sign and McBurney's sign.     Hernia: No hernia is present.  Musculoskeletal:        General: Normal range of motion.     Cervical back: Normal range of motion and neck supple.  Skin:    General: Skin is warm and dry.     Findings: Laceration (left eyebrow) present. No rash.  Neurological:     Mental Status: She is alert. Mental status is at baseline. She is disoriented.     Cranial Nerves: No cranial nerve deficit.     Sensory: No sensory deficit.     Coordination: Coordination normal.  Psychiatric:        Speech: Speech normal.        Behavior: Behavior normal.        Thought Content: Thought content normal.     ED Results / Procedures / Treatments   Labs (all labs ordered are listed, but only abnormal results are displayed) Labs Reviewed - No data to display  EKG None  Radiology CT HEAD WO CONTRAST  Result Date: 01/31/2019 CLINICAL DATA:  Fall. EXAM: CT HEAD WITHOUT CONTRAST CT CERVICAL SPINE WITHOUT CONTRAST TECHNIQUE: Multidetector CT imaging of the head and cervical spine was performed following the standard protocol without intravenous contrast. Multiplanar CT image reconstructions of the cervical spine were also generated. COMPARISON:  Head, maxillofacial, and cervical spine CTs 01/26/2019 FINDINGS: CT HEAD FINDINGS Brain: There is no evidence of acute infarct, intracranial hemorrhage, mass, midline shift, or extra-axial fluid collection. Hypodensities in the cerebral white matter bilaterally are unchanged and nonspecific  but compatible with moderate chronic small vessel ischemic disease. 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 There is severe bilateral mesial temporal lobe atrophy. Vascular: Calcified atherosclerosis at the skull base. No hyperdense vessel. Skull: No fracture or suspicious osseous lesion. Sinuses/Orbits: Visualized paranasal sinuses and mastoid air cells are clear intact globes. Moderate-sized hematoma superior and lateral to the left orbit. Other: None. CT CERVICAL SPINE FINDINGS Alignment: Straightening of the normal cervical lordosis with unchanged minimal anterolisthesis of  C3 on C4. Skull base and vertebrae: No acute fracture identified within limitations of mild motion artifact. Soft tissues and spinal canal: No prevertebral fluid or swelling. No visible canal hematoma. Disc levels: Unchanged cervical disc and facet degeneration with moderate right neural foraminal stenosis at C6-7 due to uncovertebral and facet spurring. Upper chest: Clear lung apices. Other: Mild diffuse thyroid heterogeneity without a dominant nodule. IMPRESSION: 1. No evidence of acute intracranial abnormality. 2. Moderate chronic small vessel ischemic disease. 3. Left periorbital hematoma. 4. No acute fracture or traumatic subluxation identified in the cervical spine. Electronically Signed   By: Logan Bores M.D.   On: 01/31/2019 07:37   CT CERVICAL SPINE WO CONTRAST  Result Date: 01/31/2019 CLINICAL DATA:  Fall. EXAM: CT HEAD WITHOUT CONTRAST CT CERVICAL SPINE WITHOUT CONTRAST TECHNIQUE: Multidetector CT imaging of the head and cervical spine was performed following the standard protocol without intravenous contrast. Multiplanar CT image reconstructions of the cervical spine were also generated. COMPARISON:  Head, maxillofacial, and cervical spine CTs 01/26/2019 FINDINGS: CT HEAD FINDINGS Brain: There is no evidence of acute infarct, intracranial hemorrhage, mass, midline shift, or extra-axial fluid collection.  Hypodensities in the cerebral white matter bilaterally are unchanged and nonspecific but compatible with moderate chronic small vessel ischemic disease. 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 There is severe bilateral mesial temporal lobe atrophy. Vascular: Calcified atherosclerosis at the skull base. No hyperdense vessel. Skull: No fracture or suspicious osseous lesion. Sinuses/Orbits: Visualized paranasal sinuses and mastoid air cells are clear intact globes. Moderate-sized hematoma superior and lateral to the left orbit. Other: None. CT CERVICAL SPINE FINDINGS Alignment: Straightening of the normal cervical lordosis with unchanged minimal anterolisthesis of C3 on C4. Skull base and vertebrae: No acute fracture identified within limitations of mild motion artifact. Soft tissues and spinal canal: No prevertebral fluid or swelling. No visible canal hematoma. Disc levels: Unchanged cervical disc and facet degeneration with moderate right neural foraminal stenosis at C6-7 due to uncovertebral and facet spurring. Upper chest: Clear lung apices. Other: Mild diffuse thyroid heterogeneity without a dominant nodule. IMPRESSION: 1. No evidence of acute intracranial abnormality. 2. Moderate chronic small vessel ischemic disease. 3. Left periorbital hematoma. 4. No acute fracture or traumatic subluxation identified in the cervical spine. Electronically Signed   By: Logan Bores M.D.   On: 01/31/2019 07:37   DG Knee Complete 4 Views Left  Result Date: 01/31/2019 CLINICAL DATA:  Fall. Leg pain. EXAM: LEFT KNEE - COMPLETE 4+ VIEW COMPARISON:  01/26/2019 FINDINGS: No acute fracture or dislocation is identified. Osteoarthrosis is again noted including prominent lateral compartment osteophytosis. There is likely a small persistent knee joint effusion. Mild soft tissue swelling is noted anterior to the knee. The bones are diffusely osteopenic. IMPRESSION: 1. No acute osseous abnormality identified. 2. Small knee  joint effusion. 3. Advanced lateral compartment osteoarthrosis. Electronically Signed   By: Logan Bores M.D.   On: 01/31/2019 07:29   DG Hip Unilat W or Wo Pelvis 2-3 Views Left  Result Date: 01/31/2019 CLINICAL DATA:  Fall. Leg pain. EXAM: DG HIP (WITH OR WITHOUT PELVIS) 2-3V LEFT COMPARISON:  Pelvis radiograph 01/26/2019 FINDINGS: No acute fracture or hip dislocation is identified. A 1.5 cm oval calcification projects medial to the right femoral head, in a different position than on the prior study and extending beyond the hip joint, possibly within the overlying soft tissues. Lower lumbar spondylosis is noted. IMPRESSION: No evidence of acute osseous abnormality. Electronically Signed   By: Zenia Resides  Jeralyn Ruths M.D.   On: 01/31/2019 07:27    Procedures .Marland KitchenLaceration Repair  Date/Time: 01/31/2019 7:26 AM Performed by: Orpah Greek, MD Authorized by: Orpah Greek, MD   Consent:    Consent obtained:  Emergent situation Universal protocol:    Site/side marked: yes     Immediately prior to procedure, a time out was called: yes     Patient identity confirmed:  Hospital-assigned identification number Anesthesia (see MAR for exact dosages):    Anesthesia method:  Local infiltration   Local anesthetic:  Lidocaine 2% w/o epi Laceration details:    Location:  Face   Face location:  L eyebrow   Length (cm):  1.3 Repair type:    Repair type:  Simple Pre-procedure details:    Preparation:  Patient was prepped and draped in usual sterile fashion and imaging obtained to evaluate for foreign bodies Exploration:    Hemostasis achieved with:  Direct pressure   Contaminated: no   Treatment:    Area cleansed with:  Hibiclens   Amount of cleaning:  Standard   Irrigation solution:  Sterile saline   Irrigation method:  Syringe Skin repair:    Repair method:  Sutures   Suture size:  5-0   Suture material:  Fast-absorbing gut   Number of sutures:  3 Approximation:    Approximation:   Close Post-procedure details:    Dressing:  Open (no dressing)   Patient tolerance of procedure:  Tolerated well, no immediate complications   (including critical care time)  Medications Ordered in ED Medications  lidocaine (XYLOCAINE) 2 % (with pres) injection 200 mg (200 mg Infiltration Given 01/31/19 [id number])    ED Course  I have reviewed the triage vital signs and the nursing notes.  Pertinent labs & imaging results that were available during my care of the patient were reviewed by me and considered in my medical decision making (see chart for details).    MDM Rules/Calculators/A&P                      Patient presented to the emergency department for evaluation after fall.  Patient complaining primarily of left sided headache and facial pain.  She has a large hematoma of the left eyebrow with a small laceration.  This was repaired with fast-absorbing gut, will not require suture removal.  CT head and cervical spine performed, no acute abnormality seen.  Patient felt to be okay for discharge.  Family, however, reports that they are having trouble managing her at home.  Consult placed to case management/social work.  Final Clinical Impression(s) / ED Diagnoses Final diagnoses:  Contusion of face, initial encounter  Facial laceration, initial encounter    Rx / DC Orders ED Discharge Orders    None       Marquon Alcala, Gwenyth Allegra, MD 01/31/19 [id number]

## 2019-01-31 NOTE — Discharge Instructions (Addendum)
Sutures (stitches) were placed in the left eyebrow.  These will dissolve, do not require removal.  Watch the area for swelling, redness, drainage that would indicate infection.  If this occurs return to the ER or see primary doctor.

## 2019-01-31 NOTE — TOC Initial Note (Signed)
Transition of Care [facility]) - Initial/Assessment Note    Patient Details  Name: Jocelyn Mccann MRN: [id number] Date of Birth: 1928/09/23  [facility] of [facility]) CM/SW Contact:    Janace Hoard, LCSW Phone Number: 01/31/2019, 09:52 AM  Clinical Narrative:                 CSW spoke with patient's daughter [name] about patient's medical needs at home. [name] reports patient has been living with her and her husband for the past 2 years. She reports since then patient has experienced several falls with 2 resulting in patient hitting her head and sustaining injury. She reports patient is in the ED today for trying to get to the bathroom, falling, and hitting her head on the floor. [name] reports she has put alarms and bells around the home for when patient gets up. Patient has dementia and get confused and wonders at times. [name] reports patient has a walker. [name] was open to patient coming home with [facility] and then working to get her her placed into SNF. CSW also explained the process of getting patient into long-term care like an ALF if needed.   CSW contacted RN CM Camell[place] for assistance with finding HH PT, OT, RN, nurse aid, and SW for patient as well as patient could benefit from a bedside commode due to several falls trying to go to the bathroom. [name] reports she will come to the hospital to pick up patient. CSW will continue to follow up for discharge needs.   [place], LCSW Transitions of Care Department [facility] [phone]  Expected Discharge Plan: [place] Barriers to Discharge: No Barriers Identified   Patient Goals and CMS Choice Patient states their goals for this hospitalization and ongoing recovery are:: Per daughter, to better care for patient CMS Medicare.gov Compare Post Acute Care list provided to:: (Daughter)    Expected Discharge Plan and Services Expected Discharge Plan: [place] In-house  Referral: Clinical Social Work Discharge Planning Services: CM Consult Post Acute Care Choice: [place] arrangements for the past 2 months: [place]                                      Prior Living Arrangements/Services Living arrangements for the past 2 months: Single Family Home Lives with:: Adult Children Patient language and need for interpreter reviewed:: Yes Do you feel safe going back to the place where you live?: Yes      Need for Family Participation in Patient Care: Yes (Comment) Care giver support system in place?: Yes (comment) Current home services: DME Criminal Activity/Legal Involvement Pertinent to Current Situation/Hospitalization: No - Comment as needed  Activities of Daily Living      Permission Sought/Granted Permission sought to share information with : Facility Art therapist granted to share information with : Yes, Verbal Permission Granted  Share Information with NAME: [name]  Permission granted to share info w AGENCY: [place] granted to share info w Relationship: daughter  Permission granted to share info w Contact Information: [phone]  Emotional Assessment Appearance:: (Unable to assess) Attitude/Demeanor/Rapport: Unable to Assess Affect (typically observed): Unable to Assess Orientation: : Fluctuating Orientation (Suspected and/or reported Sundowners) Alcohol / Substance Use: Never Used Psych Involvement: No (comment)  Admission diagnosis:  fall Patient Active Problem List   Diagnosis Date Noted  .  Syncope 12/05/2018  . SDH (subdural hematoma) ([place]) 08/21/2018  . Malignant neoplasm of upper-outer quadrant of right breast in female, estrogen receptor positive ([place]) 12/20/2017  . Osteoarthritis of knee 06/01/2017  . Bilateral knee pain 03/31/2017  . Urge incontinence of urine 03/31/2017  . Generalized anxiety disorder 03/31/2017  . Osteoporosis without current  pathological fracture 03/31/2017  . Alzheimer disease ([place]) 03/31/2017   PCP:  Alycia Rossetti, MD Pharmacy:   [id number] - [place], [place] [address] [place] [address] Phone: [phone] Fax: [phone]     Social Determinants of Health (SDOH) Interventions    Readmission Risk Interventions No flowsheet data found.

## 2019-01-31 NOTE — ED Triage Notes (Signed)
Pt BIB [facility] from home c/o fall this am after getting up to use walker. Last fall 1/15. Presents with hematoma to right temporal, c-collar, and A&Ox2 oriented to self and place. HX alzheimer. Ambulatory with walker. No blood thinners. No LOC per daughter.

## 2019-01-31 NOTE — ED Notes (Signed)
Transported to xray 

## 2019-02-01 ENCOUNTER — Telehealth: Payer: Self-pay

## 2019-02-01 ENCOUNTER — Telehealth: Payer: Self-pay | Admitting: *Deleted

## 2019-02-01 ENCOUNTER — Inpatient Hospital Stay (HOSPITAL_COMMUNITY)
Admission: EM | Admit: 2019-02-01 | Discharge: 2019-02-13 | DRG: 871 | Disposition: A | Discharge status: Skilled Nursing Facility | Payer: Medicare HMO | Attending: Internal Medicine | Admitting: Internal Medicine

## 2019-02-01 ENCOUNTER — Emergency Department (HOSPITAL_COMMUNITY): Payer: Medicare HMO

## 2019-02-01 ENCOUNTER — Other Ambulatory Visit: Payer: Self-pay

## 2019-02-01 ENCOUNTER — Telehealth: Payer: Self-pay | Admitting: Neurology

## 2019-02-01 DIAGNOSIS — R402332 Coma scale, best motor response, abnormal, at arrival to emergency department: Secondary | ICD-10-CM | POA: Diagnosis present

## 2019-02-01 DIAGNOSIS — Z79899 Other long term (current) drug therapy: Secondary | ICD-10-CM

## 2019-02-01 DIAGNOSIS — W19XXXA Unspecified fall, initial encounter: Secondary | ICD-10-CM | POA: Diagnosis present

## 2019-02-01 DIAGNOSIS — K769 Liver disease, unspecified: Secondary | ICD-10-CM

## 2019-02-01 DIAGNOSIS — M25462 Effusion, left knee: Secondary | ICD-10-CM | POA: Diagnosis present

## 2019-02-01 DIAGNOSIS — J189 Pneumonia, unspecified organism: Secondary | ICD-10-CM | POA: Diagnosis not present

## 2019-02-01 DIAGNOSIS — K7689 Other specified diseases of liver: Secondary | ICD-10-CM | POA: Diagnosis present

## 2019-02-01 DIAGNOSIS — Z91018 Allergy to other foods: Secondary | ICD-10-CM

## 2019-02-01 DIAGNOSIS — R001 Bradycardia, unspecified: Secondary | ICD-10-CM | POA: Diagnosis present

## 2019-02-01 DIAGNOSIS — R1310 Dysphagia, unspecified: Secondary | ICD-10-CM | POA: Diagnosis not present

## 2019-02-01 DIAGNOSIS — L89612 Pressure ulcer of right heel, stage 2: Secondary | ICD-10-CM | POA: Diagnosis not present

## 2019-02-01 DIAGNOSIS — S0003XA Contusion of scalp, initial encounter: Secondary | ICD-10-CM | POA: Diagnosis present

## 2019-02-01 DIAGNOSIS — A419 Sepsis, unspecified organism: Principal | ICD-10-CM | POA: Diagnosis present

## 2019-02-01 DIAGNOSIS — M25562 Pain in left knee: Secondary | ICD-10-CM | POA: Diagnosis present

## 2019-02-01 DIAGNOSIS — Z1611 Resistance to penicillins: Secondary | ICD-10-CM | POA: Diagnosis present

## 2019-02-01 DIAGNOSIS — I071 Rheumatic tricuspid insufficiency: Secondary | ICD-10-CM | POA: Diagnosis present

## 2019-02-01 DIAGNOSIS — S0181XA Laceration without foreign body of other part of head, initial encounter: Secondary | ICD-10-CM | POA: Diagnosis present

## 2019-02-01 DIAGNOSIS — J9601 Acute respiratory failure with hypoxia: Secondary | ICD-10-CM | POA: Diagnosis present

## 2019-02-01 DIAGNOSIS — I340 Nonrheumatic mitral (valve) insufficiency: Secondary | ICD-10-CM | POA: Diagnosis not present

## 2019-02-01 DIAGNOSIS — G309 Alzheimer's disease, unspecified: Secondary | ICD-10-CM | POA: Diagnosis present

## 2019-02-01 DIAGNOSIS — C50919 Malignant neoplasm of unspecified site of unspecified female breast: Secondary | ICD-10-CM | POA: Diagnosis present

## 2019-02-01 DIAGNOSIS — F411 Generalized anxiety disorder: Secondary | ICD-10-CM | POA: Diagnosis present

## 2019-02-01 DIAGNOSIS — R7989 Other specified abnormal findings of blood chemistry: Secondary | ICD-10-CM

## 2019-02-01 DIAGNOSIS — Z8673 Personal history of transient ischemic attack (TIA), and cerebral infarction without residual deficits: Secondary | ICD-10-CM

## 2019-02-01 DIAGNOSIS — D709 Neutropenia, unspecified: Secondary | ICD-10-CM | POA: Diagnosis present

## 2019-02-01 DIAGNOSIS — S065X9A Traumatic subdural hemorrhage with loss of consciousness of unspecified duration, initial encounter: Secondary | ICD-10-CM | POA: Diagnosis present

## 2019-02-01 DIAGNOSIS — R509 Fever, unspecified: Secondary | ICD-10-CM

## 2019-02-01 DIAGNOSIS — N390 Urinary tract infection, site not specified: Secondary | ICD-10-CM | POA: Diagnosis present

## 2019-02-01 DIAGNOSIS — R6510 Systemic inflammatory response syndrome (SIRS) of non-infectious origin without acute organ dysfunction: Secondary | ICD-10-CM | POA: Insufficient documentation

## 2019-02-01 DIAGNOSIS — J150 Pneumonia due to Klebsiella pneumoniae: Secondary | ICD-10-CM | POA: Diagnosis not present

## 2019-02-01 DIAGNOSIS — S065XAA Traumatic subdural hemorrhage with loss of consciousness status unknown, initial encounter: Secondary | ICD-10-CM | POA: Diagnosis present

## 2019-02-01 DIAGNOSIS — G9340 Encephalopathy, unspecified: Secondary | ICD-10-CM | POA: Diagnosis present

## 2019-02-01 DIAGNOSIS — Z20822 Contact with and (suspected) exposure to covid-19: Secondary | ICD-10-CM | POA: Diagnosis present

## 2019-02-01 DIAGNOSIS — Z91011 Allergy to milk products: Secondary | ICD-10-CM

## 2019-02-01 DIAGNOSIS — M170 Bilateral primary osteoarthritis of knee: Secondary | ICD-10-CM | POA: Diagnosis present

## 2019-02-01 DIAGNOSIS — F0280 Dementia in other diseases classified elsewhere without behavioral disturbance: Secondary | ICD-10-CM | POA: Diagnosis present

## 2019-02-01 DIAGNOSIS — K7201 Acute and subacute hepatic failure with coma: Secondary | ICD-10-CM | POA: Diagnosis present

## 2019-02-01 DIAGNOSIS — B961 Klebsiella pneumoniae [K. pneumoniae] as the cause of diseases classified elsewhere: Secondary | ICD-10-CM | POA: Diagnosis present

## 2019-02-01 DIAGNOSIS — G301 Alzheimer's disease with late onset: Secondary | ICD-10-CM | POA: Diagnosis not present

## 2019-02-01 DIAGNOSIS — Y95 Nosocomial condition: Secondary | ICD-10-CM | POA: Diagnosis not present

## 2019-02-01 DIAGNOSIS — E162 Hypoglycemia, unspecified: Secondary | ICD-10-CM | POA: Diagnosis present

## 2019-02-01 DIAGNOSIS — G9341 Metabolic encephalopathy: Secondary | ICD-10-CM | POA: Diagnosis present

## 2019-02-01 DIAGNOSIS — C50911 Malignant neoplasm of unspecified site of right female breast: Secondary | ICD-10-CM | POA: Diagnosis not present

## 2019-02-01 DIAGNOSIS — M25461 Effusion, right knee: Secondary | ICD-10-CM | POA: Diagnosis present

## 2019-02-01 DIAGNOSIS — T68XXXA Hypothermia, initial encounter: Secondary | ICD-10-CM | POA: Diagnosis present

## 2019-02-01 DIAGNOSIS — R296 Repeated falls: Secondary | ICD-10-CM | POA: Diagnosis present

## 2019-02-01 DIAGNOSIS — D61818 Other pancytopenia: Secondary | ICD-10-CM | POA: Diagnosis present

## 2019-02-01 DIAGNOSIS — R6521 Severe sepsis with septic shock: Secondary | ICD-10-CM | POA: Diagnosis present

## 2019-02-01 DIAGNOSIS — R680 Hypothermia, not associated with low environmental temperature: Secondary | ICD-10-CM | POA: Diagnosis not present

## 2019-02-01 DIAGNOSIS — H5500 Unspecified nystagmus: Secondary | ICD-10-CM | POA: Diagnosis present

## 2019-02-01 DIAGNOSIS — Z01818 Encounter for other preprocedural examination: Secondary | ICD-10-CM

## 2019-02-01 DIAGNOSIS — E538 Deficiency of other specified B group vitamins: Secondary | ICD-10-CM | POA: Diagnosis not present

## 2019-02-01 DIAGNOSIS — R569 Unspecified convulsions: Secondary | ICD-10-CM

## 2019-02-01 DIAGNOSIS — R402232 Coma scale, best verbal response, inappropriate words, at arrival to emergency department: Secondary | ICD-10-CM | POA: Diagnosis present

## 2019-02-01 DIAGNOSIS — R29810 Facial weakness: Secondary | ICD-10-CM | POA: Diagnosis present

## 2019-02-01 DIAGNOSIS — M810 Age-related osteoporosis without current pathological fracture: Secondary | ICD-10-CM | POA: Diagnosis present

## 2019-02-01 DIAGNOSIS — W06XXXA Fall from bed, initial encounter: Secondary | ICD-10-CM | POA: Diagnosis present

## 2019-02-01 DIAGNOSIS — M47812 Spondylosis without myelopathy or radiculopathy, cervical region: Secondary | ICD-10-CM | POA: Diagnosis present

## 2019-02-01 DIAGNOSIS — G40909 Epilepsy, unspecified, not intractable, without status epilepticus: Secondary | ICD-10-CM | POA: Diagnosis present

## 2019-02-01 DIAGNOSIS — Z9181 History of falling: Secondary | ICD-10-CM

## 2019-02-01 DIAGNOSIS — S01112A Laceration without foreign body of left eyelid and periocular area, initial encounter: Secondary | ICD-10-CM | POA: Diagnosis present

## 2019-02-01 DIAGNOSIS — Y92009 Unspecified place in unspecified non-institutional (private) residence as the place of occurrence of the external cause: Secondary | ICD-10-CM

## 2019-02-01 DIAGNOSIS — M25561 Pain in right knee: Secondary | ICD-10-CM | POA: Diagnosis not present

## 2019-02-01 DIAGNOSIS — F0390 Unspecified dementia without behavioral disturbance: Secondary | ICD-10-CM | POA: Diagnosis not present

## 2019-02-01 DIAGNOSIS — G92 Toxic encephalopathy: Secondary | ICD-10-CM | POA: Diagnosis not present

## 2019-02-01 DIAGNOSIS — R6520 Severe sepsis without septic shock: Secondary | ICD-10-CM | POA: Diagnosis not present

## 2019-02-01 DIAGNOSIS — Z853 Personal history of malignant neoplasm of breast: Secondary | ICD-10-CM

## 2019-02-01 DIAGNOSIS — Z515 Encounter for palliative care: Secondary | ICD-10-CM | POA: Diagnosis not present

## 2019-02-01 DIAGNOSIS — I459 Conduction disorder, unspecified: Secondary | ICD-10-CM | POA: Diagnosis present

## 2019-02-01 DIAGNOSIS — R578 Other shock: Secondary | ICD-10-CM | POA: Diagnosis not present

## 2019-02-01 DIAGNOSIS — R402132 Coma scale, eyes open, to sound, at arrival to emergency department: Secondary | ICD-10-CM | POA: Diagnosis present

## 2019-02-01 DIAGNOSIS — Z0189 Encounter for other specified special examinations: Secondary | ICD-10-CM

## 2019-02-01 DIAGNOSIS — N3941 Urge incontinence: Secondary | ICD-10-CM | POA: Diagnosis present

## 2019-02-01 DIAGNOSIS — R5081 Fever presenting with conditions classified elsewhere: Secondary | ICD-10-CM

## 2019-02-01 DIAGNOSIS — F0281 Dementia in other diseases classified elsewhere with behavioral disturbance: Secondary | ICD-10-CM | POA: Diagnosis not present

## 2019-02-01 DIAGNOSIS — Z7189 Other specified counseling: Secondary | ICD-10-CM | POA: Diagnosis not present

## 2019-02-01 DIAGNOSIS — R609 Edema, unspecified: Secondary | ICD-10-CM

## 2019-02-01 DIAGNOSIS — I361 Nonrheumatic tricuspid (valve) insufficiency: Secondary | ICD-10-CM | POA: Diagnosis not present

## 2019-02-01 DIAGNOSIS — L8990 Pressure ulcer of unspecified site, unspecified stage: Secondary | ICD-10-CM | POA: Insufficient documentation

## 2019-02-01 DIAGNOSIS — L89611 Pressure ulcer of right heel, stage 1: Secondary | ICD-10-CM | POA: Diagnosis present

## 2019-02-01 DIAGNOSIS — Z803 Family history of malignant neoplasm of breast: Secondary | ICD-10-CM

## 2019-02-01 DIAGNOSIS — Z79811 Long term (current) use of aromatase inhibitors: Secondary | ICD-10-CM

## 2019-02-01 DIAGNOSIS — I10 Essential (primary) hypertension: Secondary | ICD-10-CM | POA: Diagnosis present

## 2019-02-01 DIAGNOSIS — R402414 Glasgow coma scale score 13-15, 24 hours or more after hospital admission: Secondary | ICD-10-CM | POA: Diagnosis present

## 2019-02-01 DIAGNOSIS — R5381 Other malaise: Secondary | ICD-10-CM | POA: Diagnosis present

## 2019-02-01 LAB — CBC WITH DIFFERENTIAL/PLATELET
Abs Immature Granulocytes: 0 10*3/uL (ref 0.00–0.07)
Basophils Absolute: 0 10*3/uL (ref 0.0–0.1)
Basophils Relative: 1 %
Eosinophils Absolute: 0 10*3/uL (ref 0.0–0.5)
Eosinophils Relative: 1 %
HCT: 31.6 % — ABNORMAL LOW (ref 36.0–46.0)
Hemoglobin: 10.4 g/dL — ABNORMAL LOW (ref 12.0–15.0)
Lymphocytes Relative: 55 %
Lymphs Abs: 0.6 10*3/uL — ABNORMAL LOW (ref 0.7–4.0)
MCH: 32.8 pg (ref 26.0–34.0)
MCHC: 32.9 g/dL (ref 30.0–36.0)
MCV: 99.7 fL (ref 80.0–100.0)
Monocytes Absolute: 0.1 10*3/uL (ref 0.1–1.0)
Monocytes Relative: 5 %
Neutro Abs: 0.4 10*3/uL — ABNORMAL LOW (ref 1.7–7.7)
Neutrophils Relative %: 38 %
Platelets: 102 10*3/uL — ABNORMAL LOW (ref 150–400)
RBC: 3.17 MIL/uL — ABNORMAL LOW (ref 3.87–5.11)
RDW: 16.5 % — ABNORMAL HIGH (ref 11.5–15.5)
WBC: 1.1 10*3/uL — CL (ref 4.0–10.5)
nRBC: 0 % (ref 0.0–0.2)
nRBC: 1 /100 WBC — ABNORMAL HIGH

## 2019-02-01 LAB — POCT I-STAT 7, (LYTES, BLD GAS, ICA,H+H)
Acid-Base Excess: 2 mmol/L (ref 0.0–2.0)
Bicarbonate: 24.9 mmol/L (ref 20.0–28.0)
Calcium, Ion: 1.33 mmol/L (ref 1.15–1.40)
HCT: 27 % — ABNORMAL LOW (ref 36.0–46.0)
Hemoglobin: 9.2 g/dL — ABNORMAL LOW (ref 12.0–15.0)
O2 Saturation: 96 %
Patient temperature: 97.7
Potassium: 4.4 mmol/L (ref 3.5–5.1)
Sodium: 143 mmol/L (ref 135–145)
TCO2: 26 mmol/L (ref 22–32)
pCO2 arterial: 32.6 mmHg (ref 32.0–48.0)
pH, Arterial: 7.49 — ABNORMAL HIGH (ref 7.350–7.450)
pO2, Arterial: 76 mmHg — ABNORMAL LOW (ref 83.0–108.0)

## 2019-02-01 LAB — URINALYSIS, ROUTINE W REFLEX MICROSCOPIC
Bacteria, UA: NONE SEEN
Bilirubin Urine: NEGATIVE
Glucose, UA: 50 mg/dL — AB
Hgb urine dipstick: NEGATIVE
Ketones, ur: NEGATIVE mg/dL
Nitrite: NEGATIVE
Protein, ur: NEGATIVE mg/dL
Specific Gravity, Urine: 1.016 (ref 1.005–1.030)
pH: 5 (ref 5.0–8.0)

## 2019-02-01 LAB — COMPREHENSIVE METABOLIC PANEL
ALT: 185 U/L — ABNORMAL HIGH (ref 0–44)
AST: 179 U/L — ABNORMAL HIGH (ref 15–41)
Albumin: 2.9 g/dL — ABNORMAL LOW (ref 3.5–5.0)
Alkaline Phosphatase: 139 U/L — ABNORMAL HIGH (ref 38–126)
Anion gap: 8 (ref 5–15)
BUN: 29 mg/dL — ABNORMAL HIGH (ref 8–23)
CO2: 27 mmol/L (ref 22–32)
Calcium: 9.5 mg/dL (ref 8.9–10.3)
Chloride: 104 mmol/L (ref 98–111)
Creatinine, Ser: 0.77 mg/dL (ref 0.44–1.00)
GFR calc Af Amer: >60 mL/min (ref >60)
GFR calc non Af Amer: >60 mL/min (ref >60)
Glucose, Bld: 173 mg/dL — ABNORMAL HIGH (ref 70–99)
Potassium: 4.4 mmol/L (ref 3.5–5.1)
Sodium: 139 mmol/L (ref 135–145)
Total Bilirubin: 0.6 mg/dL (ref 0.3–1.2)
Total Protein: 5.8 g/dL — ABNORMAL LOW (ref 6.5–8.1)

## 2019-02-01 LAB — CBG MONITORING, ED
Glucose-Capillary: 120 mg/dL — ABNORMAL HIGH (ref 70–99)
Glucose-Capillary: 23 mg/dL — CL (ref 70–99)
Glucose-Capillary: 80 mg/dL (ref 70–99)

## 2019-02-01 LAB — CK: Total CK: 132 U/L (ref 38–234)

## 2019-02-01 LAB — LACTIC ACID, PLASMA: Lactic Acid, Venous: 1.4 mmol/L (ref 0.5–1.9)

## 2019-02-01 LAB — TSH: TSH: 1.204 u[IU]/mL (ref 0.350–4.500)

## 2019-02-01 LAB — RESPIRATORY PANEL BY RT PCR (FLU A&B, COVID)
Influenza A by PCR: NEGATIVE
Influenza B by PCR: NEGATIVE
SARS Coronavirus 2 by RT PCR: NEGATIVE

## 2019-02-01 MED ORDER — ACETAMINOPHEN 325 MG PO TABS
650.0000 mg | ORAL_TABLET | Freq: Four times a day (QID) | ORAL | Status: DC | PRN
  Administered 2019-02-07 – 2019-02-13 (×4): 650 mg via ORAL
  Filled 2019-02-01 (×4): qty 2

## 2019-02-01 MED ORDER — SODIUM CHLORIDE 0.9 % IV SOLN
2.0000 g | Freq: Two times a day (BID) | INTRAVENOUS | Status: DC
  Administered 2019-02-02 – 2019-02-04 (×5): 2 g via INTRAVENOUS
  Filled 2019-02-01 (×5): qty 2

## 2019-02-01 MED ORDER — QUETIAPINE FUMARATE 50 MG PO TABS
25.0000 mg | ORAL_TABLET | Freq: Every day | ORAL | Status: DC
  Administered 2019-02-02: 25 mg via ORAL
  Administered 2019-02-03 – 2019-02-04 (×2): 50 mg via ORAL
  Filled 2019-02-01 (×4): qty 1

## 2019-02-01 MED ORDER — VANCOMYCIN HCL IN DEXTROSE 1-5 GM/200ML-% IV SOLN
1000.0000 mg | Freq: Once | INTRAVENOUS | Status: AC
  Administered 2019-02-01: 1000 mg via INTRAVENOUS
  Filled 2019-02-01: qty 200

## 2019-02-01 MED ORDER — ONDANSETRON HCL 4 MG PO TABS: 4.0000 mg | ORAL_TABLET | Freq: Four times a day (QID) | ORAL | Status: DC | PRN

## 2019-02-01 MED ORDER — SODIUM CHLORIDE 0.9 % IV SOLN
1.0000 g | Freq: Once | INTRAVENOUS | Status: AC
  Administered 2019-02-01: 1 g via INTRAVENOUS
  Filled 2019-02-01: qty 10

## 2019-02-01 MED ORDER — SODIUM CHLORIDE 0.9 % IV SOLN: INTRAVENOUS | Status: DC

## 2019-02-01 MED ORDER — NOREPINEPHRINE 4 MG/250ML-% IV SOLN
INTRAVENOUS | Status: AC
  Administered 2019-02-01: 4 mg via INTRAVENOUS
  Filled 2019-02-01: qty 250

## 2019-02-01 MED ORDER — ENOXAPARIN SODIUM 40 MG/0.4ML ~~LOC~~ SOLN
40.0000 mg | SUBCUTANEOUS | Status: DC
  Administered 2019-02-01: 40 mg via SUBCUTANEOUS
  Filled 2019-02-01: qty 0.4

## 2019-02-01 MED ORDER — SODIUM CHLORIDE 0.9 % IV SOLN
2.0000 g | Freq: Once | INTRAVENOUS | Status: AC
  Administered 2019-02-01: 2 g via INTRAVENOUS
  Filled 2019-02-01: qty 2

## 2019-02-01 MED ORDER — NOREPINEPHRINE 4 MG/250ML-% IV SOLN
0.0000 ug/min | INTRAVENOUS | Status: DC
  Administered 2019-02-02: 9 ug/min via INTRAVENOUS
  Administered 2019-02-02: 8 ug/min via INTRAVENOUS
  Filled 2019-02-01 (×2): qty 250

## 2019-02-01 MED ORDER — DEXTROSE 50 % IV SOLN
INTRAVENOUS | Status: AC
  Filled 2019-02-01: qty 50

## 2019-02-01 MED ORDER — SERTRALINE HCL 50 MG PO TABS
25.0000 mg | ORAL_TABLET | Freq: Every day | ORAL | Status: DC
  Administered 2019-02-03 – 2019-02-04 (×2): 25 mg via ORAL
  Filled 2019-02-01 (×2): qty 1

## 2019-02-01 MED ORDER — VANCOMYCIN HCL IN DEXTROSE 1-5 GM/200ML-% IV SOLN
1000.0000 mg | INTRAVENOUS | Status: DC
  Administered 2019-02-02: 1000 mg via INTRAVENOUS
  Filled 2019-02-01: qty 200

## 2019-02-01 MED ORDER — LEVETIRACETAM 250 MG PO TABS
250.0000 mg | ORAL_TABLET | Freq: Two times a day (BID) | ORAL | Status: DC
  Filled 2019-02-01: qty 1

## 2019-02-01 MED ORDER — SODIUM CHLORIDE 0.9 % IV SOLN
250.0000 mg | Freq: Two times a day (BID) | INTRAVENOUS | Status: DC
  Filled 2019-02-01 (×2): qty 2.5

## 2019-02-01 MED ORDER — DONEPEZIL HCL 5 MG PO TABS
5.0000 mg | ORAL_TABLET | Freq: Every day | ORAL | Status: DC
  Filled 2019-02-01: qty 1

## 2019-02-01 MED ORDER — ONDANSETRON HCL 4 MG/2ML IJ SOLN: 4.0000 mg | Freq: Four times a day (QID) | INTRAMUSCULAR | Status: DC | PRN

## 2019-02-01 MED ORDER — SODIUM CHLORIDE 0.9 % IV SOLN
1000.0000 mL | INTRAVENOUS | Status: DC
  Administered 2019-02-01: 1000 mL via INTRAVENOUS

## 2019-02-01 MED ORDER — MAGNESIUM HYDROXIDE 400 MG/5ML PO SUSP: 30.0000 mL | Freq: Every day | ORAL | Status: DC | PRN

## 2019-02-01 MED ORDER — ACETAMINOPHEN 650 MG RE SUPP
650.0000 mg | Freq: Four times a day (QID) | RECTAL | Status: DC | PRN
  Administered 2019-02-09: 650 mg via RECTAL
  Filled 2019-02-01: qty 1

## 2019-02-01 NOTE — ED Provider Notes (Signed)
[facility] EMERGENCY DEPARTMENT Provider Note   CSN: [id number] Arrival date & time: 02/01/19  1334     History Chief Complaint  Patient presents with  . Code Sepsis  . Fall    Jocelyn Mccann is a 86 y.o. female.  Patient with history of significant dementia, family currently looking into nursing home/hospice options, recent falls and seen for a fall and laceration repair yesterday presents with unwitnessed fall out of bed, worsening confusion.  Family reports patient has been more confused the past week.  At baseline patient is able to ask for help but never oriented.  Unsure if patient hit her head.  Can get details from patient due to dementia and confusion.        Past Medical History:  Diagnosis Date  . Bilateral knee pain   . Breast cancer ([place])    diagnosed 11/2017  . Dementia ([place])   . Osteoporosis   . Urge incontinence of urine     Patient Active Problem List   Diagnosis Date Noted  . Syncope 12/05/2018  . SDH (subdural hematoma) ([place]) 08/21/2018  . Malignant neoplasm of upper-outer quadrant of right breast in female, estrogen receptor positive ([place]) 12/20/2017  . Osteoarthritis of knee 06/01/2017  . Bilateral knee pain 03/31/2017  . Urge incontinence of urine 03/31/2017  . Generalized anxiety disorder 03/31/2017  . Osteoporosis without current pathological fracture 03/31/2017  . Alzheimer disease ([place]) 03/31/2017    Past Surgical History:  Procedure Laterality Date  . BREAST LUMPECTOMY Right 2001  . BREAST LUMPECTOMY Right 12/2017  . BREAST LUMPECTOMY WITH SENTINEL LYMPH NODE BIOPSY Right 2001  . BREAST LUMPECTOMY WITH SENTINEL LYMPH NODE BIOPSY Right 12/30/2017   Procedure: RIGHT BREAST LUMPECTOMY WITH SENTINEL LYMPH NODE BIOPSY ERAS PATHWAY;  Surgeon: Stark Klein, MD;  Location: [facility];  Service: General;  Laterality: Right;  . DENTAL SURGERY       OB History   No obstetric history on file.     Family History  Problem  Relation Age of Onset  . Cancer Mother        breast    Social History   Tobacco Use  . Smoking status: Never Smoker  . Smokeless tobacco: Never Used  Substance Use Topics  . Alcohol use: Never  . Drug use: Never    Home Medications Prior to Admission medications   Medication Sig Start Date End Date Taking? Authorizing Provider  anastrozole (ARIMIDEX) 1 MG tablet Take 1 tablet (1 mg total) by mouth daily. Patient not taking: Reported on 12/05/2018 11/27/18   Magrinat, Virgie Dad, MD  cholecalciferol (VITAMIN D3) 25 MCG (1000 UT) tablet Take 1 tablet (1,000 Units total) by mouth daily. 02/02/18   Magrinat, Virgie Dad, MD  donepezil (ARICEPT) 5 MG tablet TAKE 1 TABLET (5 MG TOTAL) BY MOUTH AT BEDTIME. FOR ALZHEIMER'S DISEASE 01/29/19   Sater, Nanine Means, MD  levETIRAcetam (KEPPRA) 250 MG tablet Take 1 tablet (250 mg total) by mouth 2 (two) times daily. 01/08/19   Sater, Nanine Means, MD  QUEtiapine (SEROQUEL) 25 MG tablet TAKE 1 TABLET BY MOUTH EVERYDAY AT BEDTIME 01/31/19   Sater, Nanine Means, MD  sertraline (ZOLOFT) 25 MG tablet Take 1 tablet (25 mg total) by mouth daily. 12/05/18   Sater, Nanine Means, MD  traMADol (ULTRAM) 50 MG tablet Take 1 tablet (50 mg total) by mouth every 6 (six) hours as needed for moderate pain. 08/25/18   Shelly Coss, MD    Allergies  Patient has no known allergies.  Review of Systems   Review of Systems  Unable to perform ROS: Dementia    Physical Exam Updated Vital Signs BP 126/81   Pulse (!) 53   Temp (!) 87 F (30.6 C) (Rectal)   Resp 11   Ht 5\' 6"  (1.676 m)   Wt 61 kg   SpO2 100%   BMI 21.71 kg/m   Physical Exam Constitutional:      Appearance: She is ill-appearing.  HENT:     Head: Normocephalic.     Comments: Laceration repaired above left eyebrow no sign of active infection. Eyes:     Pupils: Pupils are equal, round, and reactive to light.  Cardiovascular:     Rate and Rhythm: Bradycardia present.  Pulmonary:     Effort: Pulmonary  effort is normal.  Abdominal:     General: There is no distension.     Tenderness: There is no abdominal tenderness.  Musculoskeletal:     Cervical back: No rigidity or tenderness.  Skin:    Capillary Refill: Capillary refill takes 2 to 3 seconds.     Comments: Cool skin  Neurological:     GCS: GCS eye subscore is 3. GCS verbal subscore is 3. GCS motor subscore is 5.     Comments: Patient has dementia history, clinically encephalopathic, difficult neuro exam as patient will not follow commands.  No discernible verbal response.  Pupils equal bilateral.  Psychiatric:     Comments: Dementia history, acute encephalopathy, nonverbal     ED Results / Procedures / Treatments   Labs (all labs ordered are listed, but only abnormal results are displayed) Labs Reviewed  COMPREHENSIVE METABOLIC PANEL - Abnormal; Notable for the following components:      Result Value   Glucose, Bld 173 (*)    BUN 29 (*)    Total Protein 5.8 (*)    Albumin 2.9 (*)    AST 179 (*)    ALT 185 (*)    Alkaline Phosphatase 139 (*)    All other components within normal limits  CBC WITH DIFFERENTIAL/PLATELET - Abnormal; Notable for the following components:   WBC 1.1 (*)    RBC 3.17 (*)    Hemoglobin 10.4 (*)    HCT 31.6 (*)    RDW 16.5 (*)    Platelets 102 (*)    Neutro Abs 0.4 (*)    Lymphs Abs 0.6 (*)    nRBC 1 (*)    All other components within normal limits  CULTURE, BLOOD (ROUTINE X 2)  CULTURE, BLOOD (ROUTINE X 2)  URINE CULTURE  RESPIRATORY PANEL BY RT PCR (FLU A&B, COVID)  LACTIC ACID, PLASMA  CK  TSH  URINALYSIS, ROUTINE W REFLEX MICROSCOPIC  T4  PATHOLOGIST SMEAR REVIEW    EKG EKG Interpretation  Date/Time:  Thursday February 01 2019 14:04:59 EST Ventricular Rate:  51 PR Interval:    QRS Duration: 138 QT Interval:  545 QTC Calculation: 502 R Axis:   67 Text Interpretation: sinus bradycardia Probable left ventricular hypertrophy Prolonged QT interval Confirmed by Elnora Morrison  [phone]) on 02/01/2019 4:52:55 PM   Radiology CT Head Wo Contrast  Result Date: 02/01/2019 CLINICAL DATA:  Head and neck injury. EXAM: CT HEAD WITHOUT CONTRAST CT CERVICAL SPINE WITHOUT CONTRAST TECHNIQUE: Multidetector CT imaging of the head and cervical spine was performed following the standard protocol without intravenous contrast. Multiplanar CT image reconstructions of the cervical spine were also generated. COMPARISON:  Same examinations yesterday. FINDINGS:  CT HEAD FINDINGS Brain: Generalized atrophy, with some temporal lobe predominance. No sign of acute infarction of the brainstem or cerebellum. Chronic small-vessel ischemic changes of the cerebral hemispheric white matter. Old small vessel basal ganglia infarctions. No mass lesion, hemorrhage, hydrocephalus or extra-axial collection. Vascular: There is atherosclerotic calcification of the major vessels at the base of the brain. Skull: Negative.  No skull fracture. Sinuses/Orbits: Clear/normal Other: None CT CERVICAL SPINE FINDINGS Alignment: No traumatic malalignment. Skull base and vertebrae: No fracture in the cervical or upper thoracic region. Soft tissues and spinal canal: Negative Disc levels: Chronic degenerative spondylosis from C2-3 through C6-7. Facet osteoarthritis on the right at C2-3, C3-4 and C4-5 and on the left at C3-4, C4-5 and C7-T1. Upper chest: Negative Other: None IMPRESSION: No change since yesterday. Head CT: No acute or traumatic finding. Advanced atrophy, temporal lobe predominant. Chronic small-vessel changes. Cervical spine CT: No acute or traumatic finding. Chronic degenerative cervical spondylosis and facet osteoarthritis. Electronically Signed   By: Nelson Chimes M.D.   On: 02/01/2019 16:33   CT HEAD WO CONTRAST  Result Date: 01/31/2019 CLINICAL DATA:  Fall. EXAM: CT HEAD WITHOUT CONTRAST CT CERVICAL SPINE WITHOUT CONTRAST TECHNIQUE: Multidetector CT imaging of the head and cervical spine was performed following the  standard protocol without intravenous contrast. Multiplanar CT image reconstructions of the cervical spine were also generated. COMPARISON:  Head, maxillofacial, and cervical spine CTs 01/26/2019 FINDINGS: CT HEAD FINDINGS Brain: There is no evidence of acute infarct, intracranial hemorrhage, mass, midline shift, or extra-axial fluid collection. Hypodensities in the cerebral white matter bilaterally are unchanged and nonspecific but compatible with moderate chronic small vessel ischemic disease. 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 There is severe bilateral mesial temporal lobe atrophy. Vascular: Calcified atherosclerosis at the skull base. No hyperdense vessel. Skull: No fracture or suspicious osseous lesion. Sinuses/Orbits: Visualized paranasal sinuses and mastoid air cells are clear intact globes. Moderate-sized hematoma superior and lateral to the left orbit. Other: None. CT CERVICAL SPINE FINDINGS Alignment: Straightening of the normal cervical lordosis with unchanged minimal anterolisthesis of C3 on C4. Skull base and vertebrae: No acute fracture identified within limitations of mild motion artifact. Soft tissues and spinal canal: No prevertebral fluid or swelling. No visible canal hematoma. Disc levels: Unchanged cervical disc and facet degeneration with moderate right neural foraminal stenosis at C6-7 due to uncovertebral and facet spurring. Upper chest: Clear lung apices. Other: Mild diffuse thyroid heterogeneity without a dominant nodule. IMPRESSION: 1. No evidence of acute intracranial abnormality. 2. Moderate chronic small vessel ischemic disease. 3. Left periorbital hematoma. 4. No acute fracture or traumatic subluxation identified in the cervical spine. Electronically Signed   By: Logan Bores M.D.   On: 01/31/2019 07:37   CT Cervical Spine Wo Contrast  Result Date: 02/01/2019 CLINICAL DATA:  Head and neck injury. EXAM: CT HEAD WITHOUT CONTRAST CT CERVICAL SPINE WITHOUT CONTRAST  TECHNIQUE: Multidetector CT imaging of the head and cervical spine was performed following the standard protocol without intravenous contrast. Multiplanar CT image reconstructions of the cervical spine were also generated. COMPARISON:  Same examinations yesterday. FINDINGS: CT HEAD FINDINGS Brain: Generalized atrophy, with some temporal lobe predominance. No sign of acute infarction of the brainstem or cerebellum. Chronic small-vessel ischemic changes of the cerebral hemispheric white matter. Old small vessel basal ganglia infarctions. No mass lesion, hemorrhage, hydrocephalus or extra-axial collection. Vascular: There is atherosclerotic calcification of the major vessels at the base of the brain. Skull: Negative.  No skull fracture. Sinuses/Orbits:  Clear/normal Other: None CT CERVICAL SPINE FINDINGS Alignment: No traumatic malalignment. Skull base and vertebrae: No fracture in the cervical or upper thoracic region. Soft tissues and spinal canal: Negative Disc levels: Chronic degenerative spondylosis from C2-3 through C6-7. Facet osteoarthritis on the right at C2-3, C3-4 and C4-5 and on the left at C3-4, C4-5 and C7-T1. Upper chest: Negative Other: None IMPRESSION: No change since yesterday. Head CT: No acute or traumatic finding. Advanced atrophy, temporal lobe predominant. Chronic small-vessel changes. Cervical spine CT: No acute or traumatic finding. Chronic degenerative cervical spondylosis and facet osteoarthritis. Electronically Signed   By: Nelson Chimes M.D.   On: 02/01/2019 16:33   CT CERVICAL SPINE WO CONTRAST  Result Date: 01/31/2019 CLINICAL DATA:  Fall. EXAM: CT HEAD WITHOUT CONTRAST CT CERVICAL SPINE WITHOUT CONTRAST TECHNIQUE: Multidetector CT imaging of the head and cervical spine was performed following the standard protocol without intravenous contrast. Multiplanar CT image reconstructions of the cervical spine were also generated. COMPARISON:  Head, maxillofacial, and cervical spine CTs  01/26/2019 FINDINGS: CT HEAD FINDINGS Brain: There is no evidence of acute infarct, intracranial hemorrhage, mass, midline shift, or extra-axial fluid collection. Hypodensities in the cerebral white matter bilaterally are unchanged and nonspecific but compatible with moderate chronic small vessel ischemic disease. 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 There is severe bilateral mesial temporal lobe atrophy. Vascular: Calcified atherosclerosis at the skull base. No hyperdense vessel. Skull: No fracture or suspicious osseous lesion. Sinuses/Orbits: Visualized paranasal sinuses and mastoid air cells are clear intact globes. Moderate-sized hematoma superior and lateral to the left orbit. Other: None. CT CERVICAL SPINE FINDINGS Alignment: Straightening of the normal cervical lordosis with unchanged minimal anterolisthesis of C3 on C4. Skull base and vertebrae: No acute fracture identified within limitations of mild motion artifact. Soft tissues and spinal canal: No prevertebral fluid or swelling. No visible canal hematoma. Disc levels: Unchanged cervical disc and facet degeneration with moderate right neural foraminal stenosis at C6-7 due to uncovertebral and facet spurring. Upper chest: Clear lung apices. Other: Mild diffuse thyroid heterogeneity without a dominant nodule. IMPRESSION: 1. No evidence of acute intracranial abnormality. 2. Moderate chronic small vessel ischemic disease. 3. Left periorbital hematoma. 4. No acute fracture or traumatic subluxation identified in the cervical spine. Electronically Signed   By: Logan Bores M.D.   On: 01/31/2019 07:37   DG Pelvis Portable  Result Date: 02/01/2019 CLINICAL DATA:  Fall. EXAM: PORTABLE PELVIS 1-2 VIEWS COMPARISON:  01/26/2019. FINDINGS: Diffuse osteopenia. Degenerative changes lumbar spine and both hips. No acute bony abnormality identified. No evidence of fracture. IMPRESSION: Diffuse osteopenia. Degenerative changes lumbar spine and both hips. No  acute abnormality identified. Electronically Signed   By: Marcello Moores  Register   On: 02/01/2019 15:04   DG Chest Port 1 View  Result Date: 02/01/2019 CLINICAL DATA:  Fall. EXAM: PORTABLE CHEST 1 VIEW COMPARISON:  01/26/2019. FINDINGS: Cardiac silhouette is normal in size. No mediastinal or hilar masses. Lungs are clear.  No pleural effusion or pneumothorax. Stable changes from right breast surgery. Skeletal structures are demineralized but grossly intact. IMPRESSION: 1. No acute cardiopulmonary disease. Electronically Signed   By: Lajean Manes M.D.   On: 02/01/2019 15:05   DG Knee Complete 4 Views Left  Result Date: 01/31/2019 CLINICAL DATA:  Fall. Leg pain. EXAM: LEFT KNEE - COMPLETE 4+ VIEW COMPARISON:  01/26/2019 FINDINGS: No acute fracture or dislocation is identified. Osteoarthrosis is again noted including prominent lateral compartment osteophytosis. There is likely a small persistent knee joint  effusion. Mild soft tissue swelling is noted anterior to the knee. The bones are diffusely osteopenic. IMPRESSION: 1. No acute osseous abnormality identified. 2. Small knee joint effusion. 3. Advanced lateral compartment osteoarthrosis. Electronically Signed   By: Logan Bores M.D.   On: 01/31/2019 07:29   DG Hip Unilat W or Wo Pelvis 2-3 Views Left  Result Date: 01/31/2019 CLINICAL DATA:  Fall. Leg pain. EXAM: DG HIP (WITH OR WITHOUT PELVIS) 2-3V LEFT COMPARISON:  Pelvis radiograph 01/26/2019 FINDINGS: No acute fracture or hip dislocation is identified. A 1.5 cm oval calcification projects medial to the right femoral head, in a different position than on the prior study and extending beyond the hip joint, possibly within the overlying soft tissues. Lower lumbar spondylosis is noted. IMPRESSION: No evidence of acute osseous abnormality. Electronically Signed   By: Logan Bores M.D.   On: 01/31/2019 07:27    Procedures Procedures (including critical care time)  Medications Ordered in ED Medications  0.9  %  sodium chloride infusion (1,000 mLs Intravenous New Bag/Given 02/01/19 1450)  vancomycin (VANCOCIN) IVPB 1000 mg/200 mL premix (has no administration in time range)  ceFEPIme (MAXIPIME) 2 g in sodium chloride 0.9 % 100 mL IVPB (has no administration in time range)  cefTRIAXone (ROCEPHIN) 1 g in sodium chloride 0.9 % 100 mL IVPB (0 g Intravenous Stopped 02/01/19 1533)    ED Course  I have reviewed the triage vital signs and the nursing notes.  Pertinent labs & imaging results that were available during my care of the patient were reviewed by me and considered in my medical decision making (see chart for details).    MDM Rules/Calculators/A&P                     Patient with significant dementia history, recent falls and evaluation presents with acute encephalopathy, frequent falls and hypothermia.  Code sepsis initiated with altered mental status, temperature 85 degrees and concern for possible urine or other infection.  Broad antibiotics ordered, IV fluids started.  Lactate pending.  Urinalysis pending.  CT scan of the head no acute bleeding or new fractures noted. Paged palliative care however the hospice provider called back.  Paged unassigned for admission and patient will need coordination with palliative care as well.  Patient care be signed out to follow-up remainder of results and finalize admission and monitoring.  EKG sinus bradycardia.  Thyroid testing normal.  Covid testing pending.  Blood work reviewed and noted to be neutropenia.  Antibiotics broadened.  HUDSON POOT was evaluated in Emergency Department on 02/01/2019 for the symptoms described in the history of present illness. She was evaluated in the context of the global COVID-19 pandemic, which necessitated consideration that the patient might be at risk for infection with the SARS-CoV-2 virus that causes COVID-19. Institutional protocols and algorithms that pertain to the evaluation of patients at risk for COVID-19 are in a  state of rapid change based on information released by regulatory bodies including the CDC and federal and state organizations. These policies and algorithms were followed during the patient's care in the ED.  Final Clinical Impression(s) / ED Diagnoses Final diagnoses:  Acute encephalopathy  Hypothermia, initial encounter  Neutropenic fever [facility])    Rx / DC Orders ED Discharge Orders    None       Elnora Morrison, MD 02/01/19 1654

## 2019-02-01 NOTE — H&P (Signed)
[facility] History and Physical  Jocelyn Mccann [id number] DOB: 08-Jan-1929 DOA: 02/01/2019  Referring physician: ED provider PCP: Alycia Rossetti, MD   Chief Complaint: Fall at home this morning  HPI: Jocelyn Mccann is a 86 y.o. female with PMH of breast cancer on treatment with anastrozole, followed by Dr. Jana Hakim, dementia followed by Neurology, with recurrent syncope, presenting from home due to fall this morning. Due to the patient's clinical status with confusion, most of the information in this HPI comes from chart review and from her daughter [name], who I spoke to over the phone.  The patient had been seen at the ED on 1/20 for a fall at home with laceration repair aboe the left eyebrown but had another unwitnessed fall at home. Her family had contacted TOC and had discussed potential Rehab/memory care placement. The patient has had decline in mentation for the past week but used to be able to communicate her needs.   In the ED She was found to have hypothermia with 19F temperature, she had neutropenia with WBC of 1.1. Blood cultures were obtained, she was started on Cefepime and Vancomycin, received Rocephin x1 dose. She was also found to have sinus bradycardia. Warming therapy was initiated and her bradycardia and hypothermia improved. She was started on IV fluids, her BP remained stable. COVID-19 and influenza were negative. UA was negative for UTI. CXR showed no consolidation. CT head and neck showed no acute findings. Her daughter confirmed that she wanted the patient to remain Full code. The hospitalist was called for her admission to the hospital .    Review of Systems:  Unable to fully evaluate due to the patient's clinical condition/confusion.   Past Medical History:  Diagnosis Date   Bilateral knee pain    Breast cancer ([place])    diagnosed 11/2017   Dementia ([place])    Osteoporosis    Urge incontinence of urine    Past Surgical History:  Procedure  Laterality Date   BREAST LUMPECTOMY Right 2001   BREAST LUMPECTOMY Right 12/2017   BREAST LUMPECTOMY WITH SENTINEL LYMPH NODE BIOPSY Right 2001   BREAST LUMPECTOMY WITH SENTINEL LYMPH NODE BIOPSY Right 12/30/2017   Procedure: RIGHT BREAST LUMPECTOMY WITH SENTINEL LYMPH NODE BIOPSY ERAS PATHWAY;  Surgeon: Stark Klein, MD;  Location: [facility];  Service: General;  Laterality: Right;   DENTAL SURGERY     Social History:  reports that she has never smoked. She has never used smokeless tobacco. She reports that she does not drink alcohol or use drugs.  No Known Allergies  Family History  Problem Relation Age of Onset   Cancer Mother        breast     Prior to Admission medications   Medication Sig Start Date End Date Taking? Authorizing Provider  anastrozole (ARIMIDEX) 1 MG tablet Take 1 tablet (1 mg total) by mouth daily. Patient not taking: Reported on 12/05/2018 11/27/18   Magrinat, Virgie Dad, MD  cholecalciferol (VITAMIN D3) 25 MCG (1000 UT) tablet Take 1 tablet (1,000 Units total) by mouth daily. 02/02/18   Magrinat, Virgie Dad, MD  donepezil (ARICEPT) 5 MG tablet TAKE 1 TABLET (5 MG TOTAL) BY MOUTH AT BEDTIME. FOR ALZHEIMER'S DISEASE 01/29/19   Sater, Nanine Means, MD  levETIRAcetam (KEPPRA) 250 MG tablet Take 1 tablet (250 mg total) by mouth 2 (two) times daily. 01/08/19   Sater, Nanine Means, MD  QUEtiapine (SEROQUEL) 25 MG tablet TAKE 1 TABLET BY MOUTH EVERYDAY AT BEDTIME 01/31/19  Sater, Nanine Means, MD  sertraline (ZOLOFT) 25 MG tablet Take 1 tablet (25 mg total) by mouth daily. 12/05/18   Sater, Nanine Means, MD  traMADol (ULTRAM) 50 MG tablet Take 1 tablet (50 mg total) by mouth every 6 (six) hours as needed for moderate pain. 08/25/18   Shelly Coss, MD   Physical Exam: Vitals:   02/01/19 1530 02/01/19 1630 02/01/19 1651 02/01/19 1700  BP: 135/76 126/81  110/68  Pulse: (!) 52 (!) 53  (!) 53  Resp: 13 11  13   Temp:   (!) 87 F (30.6 C)   TempSrc:   Rectal   SpO2: 100% 100%  98%    Weight:      Height:        Wt Readings from Last 3 Encounters:  02/01/19 61 kg  01/31/19 61 kg  01/26/19 61 kg    General:  Appears calm and comfortable, in NAD Eyes: PERRL, normal lids, irises & conjunctiva ENT: dry MM, nl tongue Neck: supple, nl ROM Cardiovascular: bradycardia, no LE edema Respiratory:  Normal respiratory effort. No wheezing or rhonchi.  Abdomen: soft, ntnd Skin: no rash or induration seen on limited exam. Laceration repair above left eyebrow with no erythema, bleeding, or discharge.  Musculoskeletal: grossly normal tone BUE/BLE Psychiatric:  Guarded mood and affect Neurologic: alert, oriented to self, no facial droop. GU: No CVA tenderness, no suprapubic tenderness.           Labs on Admission:  Basic Metabolic Panel: Recent Labs  Lab 01/26/19 0450 02/01/19 1450  NA 145 139  K 4.7 4.4  CL 109 104  CO2 27 27  GLUCOSE 69* 173*  BUN 34* 29*  CREATININE 0.84 0.77  CALCIUM 9.6 9.5   Liver Function Tests: Recent Labs  Lab 02/01/19 1450  AST 179*  ALT 185*  ALKPHOS 139*  BILITOT 0.6  PROT 5.8*  ALBUMIN 2.9*   No results for input(s): LIPASE, AMYLASE in the last 168 hours. No results for input(s): AMMONIA in the last 168 hours. CBC: Recent Labs  Lab 01/26/19 0450 02/01/19 1450  WBC 2.5* 1.1*  NEUTROABS  --  0.4*  HGB 10.4* 10.4*  HCT 31.5* 31.6*  MCV 100.6* 99.7  PLT 147* 102*   Cardiac Enzymes: Recent Labs  Lab 02/01/19 1450  CKTOTAL 132    BNP (last 3 results) No results for input(s): BNP in the last 8760 hours.  ProBNP (last 3 results) No results for input(s): PROBNP in the last 8760 hours.  CBG: Recent Labs  Lab 01/26/19 0638 01/26/19 0756  GLUCAP 68* 104*    Radiological Exams on Admission: CT Head Wo Contrast  Result Date: 02/01/2019 CLINICAL DATA:  Head and neck injury. EXAM: CT HEAD WITHOUT CONTRAST CT CERVICAL SPINE WITHOUT CONTRAST TECHNIQUE: Multidetector CT imaging of the head and cervical spine was  performed following the standard protocol without intravenous contrast. Multiplanar CT image reconstructions of the cervical spine were also generated. COMPARISON:  Same examinations yesterday. FINDINGS: CT HEAD FINDINGS Brain: Generalized atrophy, with some temporal lobe predominance. No sign of acute infarction of the brainstem or cerebellum. Chronic small-vessel ischemic changes of the cerebral hemispheric white matter. Old small vessel basal ganglia infarctions. No mass lesion, hemorrhage, hydrocephalus or extra-axial collection. Vascular: There is atherosclerotic calcification of the major vessels at the base of the brain. Skull: Negative.  No skull fracture. Sinuses/Orbits: Clear/normal Other: None CT CERVICAL SPINE FINDINGS Alignment: No traumatic malalignment. Skull base and vertebrae: No fracture in the cervical or  upper thoracic region. Soft tissues and spinal canal: Negative Disc levels: Chronic degenerative spondylosis from C2-3 through C6-7. Facet osteoarthritis on the right at C2-3, C3-4 and C4-5 and on the left at C3-4, C4-5 and C7-T1. Upper chest: Negative Other: None IMPRESSION: No change since yesterday. Head CT: No acute or traumatic finding. Advanced atrophy, temporal lobe predominant. Chronic small-vessel changes. Cervical spine CT: No acute or traumatic finding. Chronic degenerative cervical spondylosis and facet osteoarthritis. Electronically Signed   By: Nelson Chimes M.D.   On: 02/01/2019 16:33   CT HEAD WO CONTRAST  Result Date: 01/31/2019 CLINICAL DATA:  Fall. EXAM: CT HEAD WITHOUT CONTRAST CT CERVICAL SPINE WITHOUT CONTRAST TECHNIQUE: Multidetector CT imaging of the head and cervical spine was performed following the standard protocol without intravenous contrast. Multiplanar CT image reconstructions of the cervical spine were also generated. COMPARISON:  Head, maxillofacial, and cervical spine CTs 01/26/2019 FINDINGS: CT HEAD FINDINGS Brain: There is no evidence of acute infarct,  intracranial hemorrhage, mass, midline shift, or extra-axial fluid collection. Hypodensities in the cerebral white matter bilaterally are unchanged and nonspecific but compatible with moderate chronic small vessel ischemic disease. 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 There is severe bilateral mesial temporal lobe atrophy. Vascular: Calcified atherosclerosis at the skull base. No hyperdense vessel. Skull: No fracture or suspicious osseous lesion. Sinuses/Orbits: Visualized paranasal sinuses and mastoid air cells are clear intact globes. Moderate-sized hematoma superior and lateral to the left orbit. Other: None. CT CERVICAL SPINE FINDINGS Alignment: Straightening of the normal cervical lordosis with unchanged minimal anterolisthesis of C3 on C4. Skull base and vertebrae: No acute fracture identified within limitations of mild motion artifact. Soft tissues and spinal canal: No prevertebral fluid or swelling. No visible canal hematoma. Disc levels: Unchanged cervical disc and facet degeneration with moderate right neural foraminal stenosis at C6-7 due to uncovertebral and facet spurring. Upper chest: Clear lung apices. Other: Mild diffuse thyroid heterogeneity without a dominant nodule. IMPRESSION: 1. No evidence of acute intracranial abnormality. 2. Moderate chronic small vessel ischemic disease. 3. Left periorbital hematoma. 4. No acute fracture or traumatic subluxation identified in the cervical spine. Electronically Signed   By: Logan Bores M.D.   On: 01/31/2019 07:37   CT Cervical Spine Wo Contrast  Result Date: 02/01/2019 CLINICAL DATA:  Head and neck injury. EXAM: CT HEAD WITHOUT CONTRAST CT CERVICAL SPINE WITHOUT CONTRAST TECHNIQUE: Multidetector CT imaging of the head and cervical spine was performed following the standard protocol without intravenous contrast. Multiplanar CT image reconstructions of the cervical spine were also generated. COMPARISON:  Same examinations yesterday. FINDINGS:  CT HEAD FINDINGS Brain: Generalized atrophy, with some temporal lobe predominance. No sign of acute infarction of the brainstem or cerebellum. Chronic small-vessel ischemic changes of the cerebral hemispheric white matter. Old small vessel basal ganglia infarctions. No mass lesion, hemorrhage, hydrocephalus or extra-axial collection. Vascular: There is atherosclerotic calcification of the major vessels at the base of the brain. Skull: Negative.  No skull fracture. Sinuses/Orbits: Clear/normal Other: None CT CERVICAL SPINE FINDINGS Alignment: No traumatic malalignment. Skull base and vertebrae: No fracture in the cervical or upper thoracic region. Soft tissues and spinal canal: Negative Disc levels: Chronic degenerative spondylosis from C2-3 through C6-7. Facet osteoarthritis on the right at C2-3, C3-4 and C4-5 and on the left at C3-4, C4-5 and C7-T1. Upper chest: Negative Other: None IMPRESSION: No change since yesterday. Head CT: No acute or traumatic finding. Advanced atrophy, temporal lobe predominant. Chronic small-vessel changes. Cervical spine CT: No acute  or traumatic finding. Chronic degenerative cervical spondylosis and facet osteoarthritis. Electronically Signed   By: Nelson Chimes M.D.   On: 02/01/2019 16:33   CT CERVICAL SPINE WO CONTRAST  Result Date: 01/31/2019 CLINICAL DATA:  Fall. EXAM: CT HEAD WITHOUT CONTRAST CT CERVICAL SPINE WITHOUT CONTRAST TECHNIQUE: Multidetector CT imaging of the head and cervical spine was performed following the standard protocol without intravenous contrast. Multiplanar CT image reconstructions of the cervical spine were also generated. COMPARISON:  Head, maxillofacial, and cervical spine CTs 01/26/2019 FINDINGS: CT HEAD FINDINGS Brain: There is no evidence of acute infarct, intracranial hemorrhage, mass, midline shift, or extra-axial fluid collection. Hypodensities in the cerebral white matter bilaterally are unchanged and nonspecific but compatible with moderate  chronic small vessel ischemic disease. 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 There is severe bilateral mesial temporal lobe atrophy. Vascular: Calcified atherosclerosis at the skull base. No hyperdense vessel. Skull: No fracture or suspicious osseous lesion. Sinuses/Orbits: Visualized paranasal sinuses and mastoid air cells are clear intact globes. Moderate-sized hematoma superior and lateral to the left orbit. Other: None. CT CERVICAL SPINE FINDINGS Alignment: Straightening of the normal cervical lordosis with unchanged minimal anterolisthesis of C3 on C4. Skull base and vertebrae: No acute fracture identified within limitations of mild motion artifact. Soft tissues and spinal canal: No prevertebral fluid or swelling. No visible canal hematoma. Disc levels: Unchanged cervical disc and facet degeneration with moderate right neural foraminal stenosis at C6-7 due to uncovertebral and facet spurring. Upper chest: Clear lung apices. Other: Mild diffuse thyroid heterogeneity without a dominant nodule. IMPRESSION: 1. No evidence of acute intracranial abnormality. 2. Moderate chronic small vessel ischemic disease. 3. Left periorbital hematoma. 4. No acute fracture or traumatic subluxation identified in the cervical spine. Electronically Signed   By: Logan Bores M.D.   On: 01/31/2019 07:37   DG Pelvis Portable  Result Date: 02/01/2019 CLINICAL DATA:  Fall. EXAM: PORTABLE PELVIS 1-2 VIEWS COMPARISON:  01/26/2019. FINDINGS: Diffuse osteopenia. Degenerative changes lumbar spine and both hips. No acute bony abnormality identified. No evidence of fracture. IMPRESSION: Diffuse osteopenia. Degenerative changes lumbar spine and both hips. No acute abnormality identified. Electronically Signed   By: Marcello Moores  Register   On: 02/01/2019 15:04   DG Chest Port 1 View  Result Date: 02/01/2019 CLINICAL DATA:  Fall. EXAM: PORTABLE CHEST 1 VIEW COMPARISON:  01/26/2019. FINDINGS: Cardiac silhouette is normal in size. No  mediastinal or hilar masses. Lungs are clear.  No pleural effusion or pneumothorax. Stable changes from right breast surgery. Skeletal structures are demineralized but grossly intact. IMPRESSION: 1. No acute cardiopulmonary disease. Electronically Signed   By: Lajean Manes M.D.   On: 02/01/2019 15:05   DG Knee Complete 4 Views Left  Result Date: 01/31/2019 CLINICAL DATA:  Fall. Leg pain. EXAM: LEFT KNEE - COMPLETE 4+ VIEW COMPARISON:  01/26/2019 FINDINGS: No acute fracture or dislocation is identified. Osteoarthrosis is again noted including prominent lateral compartment osteophytosis. There is likely a small persistent knee joint effusion. Mild soft tissue swelling is noted anterior to the knee. The bones are diffusely osteopenic. IMPRESSION: 1. No acute osseous abnormality identified. 2. Small knee joint effusion. 3. Advanced lateral compartment osteoarthrosis. Electronically Signed   By: Logan Bores M.D.   On: 01/31/2019 07:29   DG Hip Unilat W or Wo Pelvis 2-3 Views Left  Result Date: 01/31/2019 CLINICAL DATA:  Fall. Leg pain. EXAM: DG HIP (WITH OR WITHOUT PELVIS) 2-3V LEFT COMPARISON:  Pelvis radiograph 01/26/2019 FINDINGS: No acute fracture or  hip dislocation is identified. A 1.5 cm oval calcification projects medial to the right femoral head, in a different position than on the prior study and extending beyond the hip joint, possibly within the overlying soft tissues. Lower lumbar spondylosis is noted. IMPRESSION: No evidence of acute osseous abnormality. Electronically Signed   By: Logan Bores M.D.   On: 01/31/2019 07:27    EKG: Sinus bradycardia with HR of 51, no ST elevated, QTC of 502  Assessment/Plan Active Problems:   SIRS (systemic inflammatory response syndrome) ([place])  SIRS Patient presented with hypothermia and neutropenia with no current source of infection. Question possible pneumonia with no consolidation on CXR due to dehydration. Lactic acid level was normal.  Admit to  Stepdown Continue broad spectrum antibiotics  Follow up on blood cultures Consider repeating chest imaging   Acute metabolic encephalopathy  Likely secondary to infectious process and perhaps delirium form dementia Patient appears calm, continue redirection, resume home meds if patient safe for po intake with improvement in mentation.   Hypothermia Improved.  Likely due to infectious process Continue broad spectrum antibiotic  Dementia With no agitation at this time May need sitter if becomes agitated Resume home meds if tolerating po meds safely SLP evaluation  Physical debility with fall at home PT/OT ordered Consult CM  Neutropenia Likely due to infectious process in question Repeat labs in am Continue broad spectrum antibiotics  Normocytic anemia.  Appears to be stable from 1/15, Hgb has fluctuated in the past.  No signs of bleeding. Repeat labs in am.  Ordered anemia panel, stool for occult blood.   Thrombocytopenia.  Platelets of 102 with no active bleeding.  Could be reactive, due to infectious process.  Monitor closely. Dc Lovenox if platelets decreasing.  Consider Hem/Onc consultation.   Prolonged QTc Mild, at 502.  Replete electrolytes as needed Repeat EKG in am   Hypoglycemia.  Had hypoglycemia at the ED yesterday. Nl blood sugar at this time.  Will check CBG x4h x4.  Start D5 infusion if hypoglycemia recurrent.    FEN. NPO except with sips with meds if safe per RN bedside eval Continue IV fluids x 1 day, monitor UOP and volume staus  Code Status: : Full code, confirmed with her daughter DVT Prophylaxis: Lovenox [place] Family Communication: Daughter over the phone Disposition Plan: Admit to inpatient with anticipated at least 2MN stay.  Time spent: 70 minutes  Blain Pais, MD [facility]

## 2019-02-01 NOTE — Progress Notes (Addendum)
Pharmacy Antibiotic Note  Jocelyn Mccann is a 86 y.o. female admitted on 02/01/2019 with sepsis.  Patient presented with unwitnessed fall out of bed this morning. Per family patient has had altered mental status x1 week with history of dementia. WBC is 1.1, ANC 0.4, Scr 0.77 (Estimated CrCl 45.5 mL/min).  Pharmacy has been consulted for cefepime and vancomycin dosing.  Plan: Cefepime 2 gm IV q12hr Vancomycin 1000 mg IV q24hr   - Goal AUC 400-550.  - Expected AUC: 539.9  - SCr used: 0.77  - Obtain vancomycin levels as needed  Monitor CBC, renal function and clinical improvement Narrow if possible, pending cultures    Height: 5\' 6"  (167.6 cm) Weight: 134 lb 7.7 oz (61 kg) IBW/kg (Calculated) : 59.3  Temp (24hrs), Avg:86.1 F (30.1 C), Min:85.2 F (29.6 C), Max:87 F (30.6 C)  Recent Labs  Lab 01/26/19 0450 02/01/19 1433 02/01/19 1450  WBC 2.5*  --  1.1*  CREATININE 0.84  --  0.77  LATICACIDVEN  --  1.4  --     Estimated Creatinine Clearance: 45.5 mL/min (by C-G formula based on SCr of 0.77 mg/dL).    No Known Allergies  Antimicrobials this admission: 1/21 Ceftriaxone x1 1/21 Cefepime >>  1/21 Vancomycin   Microbiology results: 1/21 BCx:  1/21 UCx:    Thank you for allowing pharmacy to be a part of this patient's care.  Acey Lav, PharmD  PGY1 Acute Care Pharmacy Resident 02/01/2019 5:07 PM

## 2019-02-01 NOTE — Care Management (Signed)
ED TOC received call from Hospitalist Dr. Hayes Ludwig concerning inpatient hospice tonight, CM explained that it may not happen tonight but once Palliative has discussed level of care and goals of care  with family, TOC team is available to discuss recommended level of care

## 2019-02-01 NOTE — Telephone Encounter (Signed)
The patient apparently is in the emergency room, I tried to call the daughter, unable to reach her, she did not answer and cannot leave a message.  CT of the head did not show any acute changes but blood work shows significant issues with elevation in liver enzymes and there is evidence of a pancytopenia with a white blood count of 1.1.  The patient may need further evaluation to determine the etiology of the above changes.  This clearly could be a source of the recent alteration in cognitive status.

## 2019-02-01 NOTE — ED Notes (Signed)
Notified MD about BP and received verbal order to open a N/S bolus

## 2019-02-01 NOTE — ED Notes (Addendum)
Rectal temperature 90.12F. Bair hugger continued.

## 2019-02-01 NOTE — Telephone Encounter (Signed)
Pt daughter [name] called regarding mom recent discharge from ED on 01/31/19 and she how she has gotten weaker (can not walk) and now has a slurred speech , she states she fell again out of the bed today and is back in ED at the moment.  CB# [phone]

## 2019-02-01 NOTE — ED Notes (Signed)
Bair hugger continued. Rectal temp of 59F.

## 2019-02-01 NOTE — Telephone Encounter (Signed)
Attempted to return phone call from patient's daughter. Unable to leave VM because it is full.  Patient is in the hospital and she has some questions.

## 2019-02-01 NOTE — Telephone Encounter (Signed)
Pt daughter called regarding mom recent discharge from ED and she how she has gotten weaker (can not walk) and now has a slurred speech.  EDCM advised daughter to call EMS to have mom return to an ED.  Daughter states she will get mom in car and take her to hospital.

## 2019-02-01 NOTE — ED Notes (Signed)
Bair Hugger applied to patient.

## 2019-02-01 NOTE — ED Triage Notes (Signed)
Per EMS unwitnessed fall out of bed this AM. Family reports AMS for x1 week. Hx of dementia. Family looking into hospice or SNF placement. Stitches on L eyebrow from previous fall. Pt nonverbal on arrival. Baseline pt is able to ask for help but never oriented.

## 2019-02-02 ENCOUNTER — Inpatient Hospital Stay (HOSPITAL_COMMUNITY): Payer: Medicare HMO

## 2019-02-02 DIAGNOSIS — I340 Nonrheumatic mitral (valve) insufficiency: Secondary | ICD-10-CM

## 2019-02-02 DIAGNOSIS — G9340 Encephalopathy, unspecified: Secondary | ICD-10-CM

## 2019-02-02 DIAGNOSIS — C50911 Malignant neoplasm of unspecified site of right female breast: Secondary | ICD-10-CM

## 2019-02-02 DIAGNOSIS — R569 Unspecified convulsions: Secondary | ICD-10-CM

## 2019-02-02 DIAGNOSIS — E162 Hypoglycemia, unspecified: Secondary | ICD-10-CM

## 2019-02-02 DIAGNOSIS — J9601 Acute respiratory failure with hypoxia: Secondary | ICD-10-CM

## 2019-02-02 DIAGNOSIS — I361 Nonrheumatic tricuspid (valve) insufficiency: Secondary | ICD-10-CM

## 2019-02-02 DIAGNOSIS — A419 Sepsis, unspecified organism: Secondary | ICD-10-CM | POA: Diagnosis present

## 2019-02-02 DIAGNOSIS — R6521 Severe sepsis with septic shock: Secondary | ICD-10-CM | POA: Diagnosis present

## 2019-02-02 LAB — CBC
HCT: 29.7 % — ABNORMAL LOW (ref 36.0–46.0)
Hemoglobin: 10 g/dL — ABNORMAL LOW (ref 12.0–15.0)
MCH: 33.2 pg (ref 26.0–34.0)
MCHC: 33.7 g/dL (ref 30.0–36.0)
MCV: 98.7 fL (ref 80.0–100.0)
Platelets: 108 10*3/uL — ABNORMAL LOW (ref 150–400)
RBC: 3.01 MIL/uL — ABNORMAL LOW (ref 3.87–5.11)
RDW: 16.7 % — ABNORMAL HIGH (ref 11.5–15.5)
WBC: 2.5 10*3/uL — ABNORMAL LOW (ref 4.0–10.5)
nRBC: 2 % — ABNORMAL HIGH (ref 0.0–0.2)

## 2019-02-02 LAB — COMPREHENSIVE METABOLIC PANEL
ALT: 171 U/L — ABNORMAL HIGH (ref 0–44)
AST: 180 U/L — ABNORMAL HIGH (ref 15–41)
Albumin: 2.8 g/dL — ABNORMAL LOW (ref 3.5–5.0)
Alkaline Phosphatase: 123 U/L (ref 38–126)
Anion gap: 7 (ref 5–15)
BUN: 31 mg/dL — ABNORMAL HIGH (ref 8–23)
CO2: 23 mmol/L (ref 22–32)
Calcium: 8.9 mg/dL (ref 8.9–10.3)
Chloride: 111 mmol/L (ref 98–111)
Creatinine, Ser: 0.98 mg/dL (ref 0.44–1.00)
GFR calc Af Amer: 60 mL/min — ABNORMAL LOW (ref >60)
GFR calc non Af Amer: 52 mL/min — ABNORMAL LOW (ref >60)
Glucose, Bld: 107 mg/dL — ABNORMAL HIGH (ref 70–99)
Potassium: 4.4 mmol/L (ref 3.5–5.1)
Sodium: 141 mmol/L (ref 135–145)
Total Bilirubin: 0.6 mg/dL (ref 0.3–1.2)
Total Protein: 5.5 g/dL — ABNORMAL LOW (ref 6.5–8.1)

## 2019-02-02 LAB — HEPATITIS PANEL, ACUTE
HCV Ab: NONREACTIVE
Hep A IgM: NONREACTIVE
Hep B C IgM: NONREACTIVE
Hepatitis B Surface Ag: NONREACTIVE

## 2019-02-02 LAB — SEDIMENTATION RATE: Sed Rate: 28 mm/hr — ABNORMAL HIGH (ref 0–22)

## 2019-02-02 LAB — CK: Total CK: 111 U/L (ref 38–234)

## 2019-02-02 LAB — MRSA PCR SCREENING: MRSA by PCR: NEGATIVE

## 2019-02-02 LAB — AMMONIA: Ammonia: 28 umol/L (ref 9–35)

## 2019-02-02 LAB — ECHOCARDIOGRAM COMPLETE
Height: 62 in
Weight: 2250.46 oz

## 2019-02-02 LAB — POCT I-STAT 7, (LYTES, BLD GAS, ICA,H+H)
Acid-Base Excess: 4 mmol/L — ABNORMAL HIGH (ref 0.0–2.0)
Bicarbonate: 26.8 mmol/L (ref 20.0–28.0)
Calcium, Ion: 1.23 mmol/L (ref 1.15–1.40)
HCT: 30 % — ABNORMAL LOW (ref 36.0–46.0)
Hemoglobin: 10.2 g/dL — ABNORMAL LOW (ref 12.0–15.0)
O2 Saturation: 100 %
Patient temperature: 97.7
Potassium: 4.7 mmol/L (ref 3.5–5.1)
Sodium: 142 mmol/L (ref 135–145)
TCO2: 28 mmol/L (ref 22–32)
pCO2 arterial: 32.2 mmHg (ref 32.0–48.0)
pH, Arterial: 7.526 — ABNORMAL HIGH (ref 7.350–7.450)
pO2, Arterial: 373 mmHg — ABNORMAL HIGH (ref 83.0–108.0)

## 2019-02-02 LAB — GLUCOSE, CAPILLARY
Glucose-Capillary: 103 mg/dL — ABNORMAL HIGH (ref 70–99)
Glucose-Capillary: 113 mg/dL — ABNORMAL HIGH (ref 70–99)
Glucose-Capillary: 116 mg/dL — ABNORMAL HIGH (ref 70–99)
Glucose-Capillary: 82 mg/dL (ref 70–99)
Glucose-Capillary: 91 mg/dL (ref 70–99)
Glucose-Capillary: 99 mg/dL (ref 70–99)

## 2019-02-02 LAB — PHOSPHORUS: Phosphorus: 2.6 mg/dL (ref 2.5–4.6)

## 2019-02-02 LAB — HEMOGLOBIN A1C
Hgb A1c MFr Bld: 5.6 % (ref 4.8–5.6)
Mean Plasma Glucose: 114.02 mg/dL

## 2019-02-02 LAB — CBG MONITORING, ED
Glucose-Capillary: 78 mg/dL (ref 70–99)
Glucose-Capillary: 89 mg/dL (ref 70–99)

## 2019-02-02 LAB — T4: T4, Total: 8.5 ug/dL (ref 4.5–12.0)

## 2019-02-02 LAB — IRON AND TIBC
Iron: 113 ug/dL (ref 28–170)
Saturation Ratios: 43 % — ABNORMAL HIGH (ref 10.4–31.8)
TIBC: 262 ug/dL (ref 250–450)
UIBC: 149 ug/dL

## 2019-02-02 LAB — LACTIC ACID, PLASMA
Lactic Acid, Venous: 1.1 mmol/L (ref 0.5–1.9)
Lactic Acid, Venous: 1.9 mmol/L (ref 0.5–1.9)

## 2019-02-02 LAB — FERRITIN: Ferritin: 266 ng/mL (ref 11–307)

## 2019-02-02 LAB — C-REACTIVE PROTEIN: CRP: 0.7 mg/dL (ref <1.0)

## 2019-02-02 LAB — MAGNESIUM: Magnesium: 1.6 mg/dL — ABNORMAL LOW (ref 1.7–2.4)

## 2019-02-02 LAB — TRIGLYCERIDES: Triglycerides: 45 mg/dL (ref <150)

## 2019-02-02 LAB — VITAMIN B12: Vitamin B-12: 81 pg/mL — ABNORMAL LOW (ref 180–914)

## 2019-02-02 MED ORDER — LORAZEPAM 2 MG/ML IJ SOLN: 2.0000 mg | Freq: Once | INTRAMUSCULAR | Status: AC

## 2019-02-02 MED ORDER — LORAZEPAM 2 MG/ML IJ SOLN: 2.0000 mg | INTRAMUSCULAR | Status: DC | PRN

## 2019-02-02 MED ORDER — LORAZEPAM 2 MG/ML IJ SOLN
INTRAMUSCULAR | Status: AC
  Administered 2019-02-02: 2 mg via INTRAVENOUS
  Filled 2019-02-02: qty 1

## 2019-02-02 MED ORDER — ORAL CARE MOUTH RINSE
15.0000 mL | OROMUCOSAL | Status: DC
  Administered 2019-02-02 – 2019-02-05 (×30): 15 mL via OROMUCOSAL

## 2019-02-02 MED ORDER — LEVETIRACETAM IN NACL 500 MG/100ML IV SOLN
500.0000 mg | Freq: Two times a day (BID) | INTRAVENOUS | Status: DC
  Administered 2019-02-02 – 2019-02-05 (×8): 500 mg via INTRAVENOUS
  Filled 2019-02-02 (×9): qty 100

## 2019-02-02 MED ORDER — CHLORHEXIDINE GLUCONATE CLOTH 2 % EX PADS
6.0000 | MEDICATED_PAD | Freq: Every day | CUTANEOUS | Status: DC
  Administered 2019-02-02 – 2019-02-05 (×4): 6 via TOPICAL

## 2019-02-02 MED ORDER — PRO-STAT SUGAR FREE PO LIQD
30.0000 mL | Freq: Every day | ORAL | Status: DC
  Administered 2019-02-02: 30 mL via ORAL
  Filled 2019-02-02: qty 30

## 2019-02-02 MED ORDER — FAMOTIDINE 40 MG/5ML PO SUSR
20.0000 mg | Freq: Two times a day (BID) | ORAL | Status: DC
  Administered 2019-02-02 – 2019-02-05 (×7): 20 mg
  Filled 2019-02-02 (×7): qty 2.5

## 2019-02-02 MED ORDER — PRO-STAT SUGAR FREE PO LIQD
30.0000 mL | Freq: Every day | ORAL | Status: DC
  Administered 2019-02-03 – 2019-02-05 (×3): 30 mL
  Filled 2019-02-02 (×3): qty 30

## 2019-02-02 MED ORDER — INSULIN ASPART 100 UNIT/ML ~~LOC~~ SOLN
0.0000 [IU] | SUBCUTANEOUS | Status: DC
  Administered 2019-02-03 – 2019-02-04 (×2): 1 [IU] via SUBCUTANEOUS

## 2019-02-02 MED ORDER — MAGNESIUM SULFATE 2 GM/50ML IV SOLN
2.0000 g | Freq: Once | INTRAVENOUS | Status: AC
  Administered 2019-02-02: 2 g via INTRAVENOUS
  Filled 2019-02-02: qty 50

## 2019-02-02 MED ORDER — VITAL HIGH PROTEIN PO LIQD
1000.0000 mL | ORAL | Status: DC
  Administered 2019-02-02 – 2019-02-03 (×2): 1000 mL

## 2019-02-02 MED ORDER — ENOXAPARIN SODIUM 40 MG/0.4ML ~~LOC~~ SOLN
30.0000 mg | SUBCUTANEOUS | Status: DC
  Administered 2019-02-02: 30 mg via SUBCUTANEOUS
  Filled 2019-02-02 (×2): qty 0.4

## 2019-02-02 MED ORDER — LACTATED RINGERS IV SOLN: INTRAVENOUS | Status: DC

## 2019-02-02 MED ORDER — LEVETIRACETAM IN NACL 1000 MG/100ML IV SOLN
1000.0000 mg | Freq: Once | INTRAVENOUS | Status: AC
  Administered 2019-02-02: 1000 mg via INTRAVENOUS

## 2019-02-02 MED ORDER — CYANOCOBALAMIN 1000 MCG/ML IJ SOLN
1000.0000 ug | Freq: Once | INTRAMUSCULAR | Status: AC
  Administered 2019-02-02: 1000 ug via INTRAMUSCULAR
  Filled 2019-02-02: qty 1

## 2019-02-02 MED ORDER — LACTATED RINGERS IV BOLUS
1000.0000 mL | Freq: Once | INTRAVENOUS | Status: AC
  Administered 2019-02-02: 1000 mL via INTRAVENOUS

## 2019-02-02 MED ORDER — DEXTROSE-NACL 5-0.45 % IV SOLN: INTRAVENOUS | Status: DC

## 2019-02-02 MED ORDER — FENTANYL CITRATE (PF) 100 MCG/2ML IJ SOLN: 25.0000 ug | INTRAMUSCULAR | Status: DC | PRN

## 2019-02-02 MED ORDER — BISACODYL 10 MG RE SUPP
10.0000 mg | Freq: Every day | RECTAL | Status: DC | PRN
  Administered 2019-02-09: 10 mg via RECTAL
  Filled 2019-02-02: qty 1

## 2019-02-02 MED ORDER — PROPOFOL 1000 MG/100ML IV EMUL
0.0000 ug/kg/min | INTRAVENOUS | Status: DC
  Administered 2019-02-02: 40 ug/kg/min via INTRAVENOUS
  Administered 2019-02-02: 10 ug/kg/min via INTRAVENOUS
  Administered 2019-02-02: 30 ug/kg/min via INTRAVENOUS
  Filled 2019-02-02 (×2): qty 100

## 2019-02-02 MED ORDER — SUCCINYLCHOLINE CHLORIDE 20 MG/ML IJ SOLN
INTRAMUSCULAR | Status: AC | PRN
  Administered 2019-02-02: 100 mg via INTRAVENOUS

## 2019-02-02 MED ORDER — CHLORHEXIDINE GLUCONATE 0.12% ORAL RINSE (MEDLINE KIT)
15.0000 mL | Freq: Two times a day (BID) | OROMUCOSAL | Status: DC
  Administered 2019-02-02 – 2019-02-05 (×6): 15 mL via OROMUCOSAL

## 2019-02-02 MED ORDER — FENTANYL CITRATE (PF) 100 MCG/2ML IJ SOLN
25.0000 ug | INTRAMUSCULAR | Status: DC | PRN
  Administered 2019-02-03: 100 ug via INTRAVENOUS
  Filled 2019-02-02: qty 2

## 2019-02-02 MED ORDER — DOCUSATE SODIUM 50 MG/5ML PO LIQD: 100.0000 mg | Freq: Two times a day (BID) | ORAL | Status: DC | PRN

## 2019-02-02 MED ORDER — ETOMIDATE 2 MG/ML IV SOLN
INTRAVENOUS | Status: AC | PRN
  Administered 2019-02-02: 20 mg via INTRAVENOUS

## 2019-02-02 NOTE — Progress Notes (Signed)
Pt transported from ED TRAC to CT and to 2M08 from CT with no complications.

## 2019-02-02 NOTE — Consult Note (Signed)
   [facility] Inpatient Consult   02/02/2019  LILLAH WITTMANN [birth year] [id number]   Patient is currently active with [facility] for chronic disease management services.  Patient has been engaged by a [facility] and the [facility] Social worker.  Our community based plan of care has focused on disease management and community resource support just recently. Follow up with patient's daughter by [facility] staff.   Patient in the [facility]. Dr. Vic Blackbird, primary care provider per note.  Chart review of brief history from MD notes as follows: Brief History   Jocelyn Mccann is a 86 y.o. female with h/o right breast ca on anastrozole, Alzheimer's dementia, seizure disorder and recent history of falls who presented with an unwitnessed fall and worsening confusion. She was initially triaged to [facility] but became obtunded during her ED stay after a witnessed seizure episode, and subsequently required intubation.   Plan:  Follow patient's progress and for disposition needs, if and when appropriate, to follow up with [facility] staff.    Of note, [facility] Care Management services does not replace or interfere with any services that are needed or arranged by inpatient [facility] care management team.  For additional questions or referrals please contact:  Natividad Brood, RN BSN [facility] Liaison  [phone] business mobile phone Toll free office [phone]  Fax number: [phone] [email or web address]

## 2019-02-02 NOTE — Procedures (Signed)
Patient Name: Jocelyn Mccann  MRN: [id number]  Epilepsy Attending: Lora Havens  Referring Physician/Provider: Dr. Kathrynn Speed Date: 02/02/2019 Duration: 24.05 minutes  Patient history: 86 year old female who initially presented to the emergency room after a fall and was noted to be in sepsis on arrival.  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  EEG to evaluate for seizures.  Level of alertness: Comatose  AEDs during EEG study: Keppra, propofol  Technical aspects: This EEG study was done with scalp electrodes positioned according to the 10-20 International system of electrode placement. Electrical activity was acquired at a sampling rate of 500Hz  and reviewed with a high frequency filter of 70Hz  and a low frequency filter of 1Hz . EEG data were recorded continuously and digitally stored.   Description: EEG showed continuous generalized 3 to 6 Hz low voltage theta-delta slowing.  During the study, noxious stimuli was applied to patient's left shoulder and left hand after which patient was noted to have left hand tremor like movement lasting for few seconds.  This was reproducible on the left hand after stimulation and again when stimulation was applied to the right hand.  Concomitant EEG showed 2 to 5 Hz rhythmic theta-delta slowing without any evolution.  Hyperventilation and photic stimulation were not performed.  Abnormalities -Continuous slow, generalized  IMPRESSION: This study is suggestive of severe diffuse encephalopathy, nonspecific to etiology but could be secondary to sedation.  Episodes of left and right hand tremor-like movement on stimulation was recorded during the study without concomitant EEG change to suggest seizure.  No seizures or epileptiform discharges were seen throughout the recording.

## 2019-02-02 NOTE — ED Provider Notes (Signed)
12:45 AM  Called to evaluate admitted patient.  Patient admitted for altered mental status and found to be septic.  Critical care nurse practitioner covering for hospitalist service has seen patient.  Patient initially completely unresponsive with blood glucose of 23.  Given D50 and blood glucose improved but no improvement in altered mental status.  Patient getting broad-spectrum antibiotics and now on IV Levophed for hypotension.  Patient had rhythmic movements of her eyes.  Question if patient is in status epilepticus.  Receiving IV Keppra.  Neurology has seen patient.  Critical care NP had lengthy conversation with patient's daughter who wanted aggressive treatment including intubation, pressors.  Patient is a full code.  I was asked to intubate patient for airway protection secondary to altered mental status.  On exam, patient does not open eyes, answer questions or follow commands.  She does localize to painful stimuli with the left upper extremity and withdraws with the right upper extremity.  Has some intermittent movements of the bilateral feet.  Pupils approximately 4 mm and sluggishly reactive.  Appears to have left facial droop.  Previous head CT yesterday showed no acute abnormality.  May benefit from repeat head CT.   Critical care team at bedside for further management of patient.   Procedure Name: Intubation Date/Time: 02/02/2019 12:30 AM Performed by: Nakota Ackert, Delice Bison, DO Pre-anesthesia Checklist: Patient identified, Patient being monitored, Emergency Drugs available, Timeout performed and Suction available Oxygen Delivery Method: Non-rebreather mask Preoxygenation: Pre-oxygenation with 100% oxygen Induction Type: Rapid sequence Ventilation: Mask ventilation without difficulty Laryngoscope Size: Glidescope and 3 Grade View: Grade I Tube size: 7.5 mm Number of attempts: 1 Placement Confirmation: ETT inserted through vocal cords under direct vision,  CO2 detector and Breath sounds  checked- equal and bilateral Secured at: 20 cm Tube secured with: ETT holder      CRITICAL CARE Performed by: Cyril Mourning Aloysuis Ribaudo   Total critical care time: 30 minutes  Critical care time was exclusive of separately billable procedures and treating other patients.  Critical care was necessary to treat or prevent imminent or life-threatening deterioration.  Critical care was time spent personally by me on the following activities: development of treatment plan with patient and/or surrogate as well as nursing, discussions with consultants, evaluation of patient's response to treatment, examination of patient, obtaining history from patient or surrogate, ordering and performing treatments and interventions, ordering and review of laboratory studies, ordering and review of radiographic studies, pulse oximetry and re-evaluation of patient's condition.     Rosalea Withrow, Delice Bison, DO 02/02/19 [phone]

## 2019-02-02 NOTE — Progress Notes (Signed)
Initial Nutrition Assessment  DOCUMENTATION CODES:   Not applicable  INTERVENTION:   When ready to begin TF, recommend:   Vital High Protein at 40 ml/h (960 ml per day)   Pro-stat 30 ml once daily   Provides 1060 kcal (1168 total kcal with propofol), 99 gm protein, 803 ml free water daily  NUTRITION DIAGNOSIS:   Inadequate oral intake related to inability to eat as evidenced by NPO status.  GOAL:   Patient will meet greater than or equal to 90% of their needs  MONITOR:   Vent status, TF tolerance, Labs, I & O's  REASON FOR ASSESSMENT:   Ventilator    ASSESSMENT:   86 yo female admitted with AMS and hypothermia S/P fall; had hypoglycemia (glucose 23) and a seizure and required intubation. PMH includes breast cancer, Alzheimer's dementia, seizure D/O, recent falls.   NG tube in place. Per MD note, plans to start TF today.   Patient is currently intubated on ventilator support MV: 5.9 L/min Temp (24hrs), Avg:93.3 F (34.1 C), Min:85.2 F (29.6 C), Max:97.7 F (36.5 C)  Propofol: 4.1 ml/hr providing 108 kcal from lipid  Labs reviewed. Mag 1.6 (L) CBG's: 82-91-116  Medications reviewed and include novolog, levophed, mag sulfate, propofol.   Weight encounters reviewed. No significant weight changes noted.  NUTRITION - FOCUSED PHYSICAL EXAM:  unable to complete  Diet Order:   Diet Order            Diet NPO time specified  Diet effective now              EDUCATION NEEDS:   No education needs have been identified at this time  Skin:  Skin Assessment: Reviewed RN Assessment  Last BM:  no BM documented  Height:   Ht Readings from Last 1 Encounters:  02/02/19 5\' 2"  (1.575 m)    Weight:   Wt Readings from Last 1 Encounters:  02/02/19 63.8 kg    Ideal Body Weight:  50 kg  BMI:  Body mass index is 25.73 kg/m.  Estimated Nutritional Needs:   Kcal:  1140  Protein:  90-100 gm  Fluid:  >/= 1.4 L    Molli Barrows, RD, LDN,  [place] Pager [phone] After Hours Pager [phone]

## 2019-02-02 NOTE — Progress Notes (Signed)
NAME:  Jocelyn Mccann, MRN:  [id number], DOB:  August 28, 1928, LOS: 1 ADMISSION DATE:  02/01/2019, CONSULTATION DATE:  02/02/19  REFERRING MD:  Dr. Hayes Ludwig, CHIEF COMPLAINT:  Intubation for airway protection   Brief History   Jocelyn Mccann is a 86 y.o. female with h/o right breast ca on anastrozole, Alzheimer's dementia, seizure disorder and recent history of falls who presented with an unwitnessed fall and worsening confusion. She was initially triaged to [facility] but became obtunded during her ED stay after a witnessed seizure episode, and subsequently required intubation.   History of present illness   Jocelyn Mccann is a 86 y.o. female with h/o right breast ca on anastrozole, Alzheimer's dementia, seizure disorder and recent history of falls who presented to the ED on 1/21 after an unwitnessed fall and worsening confusion over the last week. This is her third ED visit in the last 7 days for a fall. She was previously seen on 1/20 for a fall and required sutures above her left eye. On presentation to the ED, she was hypothermic and bradycardic with recorded temp of 9F on arrival. Bair hugger was placed and cultures were obtained. She was then started on broad spectrum empiric antibiotics. Labs revealed pancytopenia with WBC 1.1 (400 neutrophils), Hgb 10.4 and plt 102. Mild elevation in LFTs also seen with AST 179 and ALT 185 on arrival. She has no known history of liver disease. Urinalysis was largely unremarkable. CXR showed no evidence for pneumonia.  While in the ED, she became more hypotensive and obtunded. She was found to have a glucose of 23 and had apparent seizure activity with rhythmic eye movements. She was given 2 mg Ativan and Neurology was consulted. She was then loaded with 1 g Keppra and the decision was made to proceed with intubation after discussion with the family. Etomidate and succinylcholine given for RSI and patient was successfully intubated by ED attending prior to  transfer.  Past Medical History  As above  Significant Hospital Events   Intubation 1/22  Consults:  Neurology  Procedures:  Intubation 1/22  Significant Diagnostic Tests:  CT HEAD WITHOUT CONTRAST & CT CERVICAL SPINE WITHOUT CONTRAST 02/01/2019 IMPRESSION: Head CT: No acute or traumatic finding. Advanced atrophy, temporal lobe predominant. Chronic small-vessel changes. Cervical spine CT: No acute or traumatic finding. Chronic degenerative cervical spondylosis and facet osteoarthritis.  PORTABLE CHEST 1 VIEW 02/01/2019 IMPRESSION: 1. No acute cardiopulmonary disease.  PORTABLE PELVIS 1-2 VIEWS 02/01/2019 IMPRESSION: Diffuse osteopenia. Degenerative changes lumbar spine and both hips. No acute abnormality identified.  Micro Data:  SARS CoV-2/Influenza PCR negative Blood cx pending Urine cx pending Trach aspirate pending  Antimicrobials:  Vancomycin 1/21 >> Cefepime 1/21 >>  Objective   Blood pressure (!) 99/50, pulse 70, temperature (!) 97.5 F (36.4 C), temperature source Oral, resp. rate 19, height 5\' 2"  (1.575 m), weight 63.8 kg, SpO2 100 %.    Vent Mode: PRVC FiO2 (%):  [40 %-100 %] 40 % Set Rate:  [16 bmp-20 bmp] 16 bmp Vt Set:  [400 mL] 400 mL PEEP:  [5 cmH20] 5 cmH20 Plateau Pressure:  [6 cmH20-14 cmH20] 6 cmH20   Intake/Output Summary (Last 24 hours) at 02/02/2019 0700 Last data filed at 02/02/2019 0600 Gross per 24 hour  Intake 972.07 ml  Output --  Net 972.07 ml   Filed Weights   02/01/19 1340 02/02/19 0240  Weight: 61 kg 63.8 kg   Examination: General: Intubated, sedated, in no acute distress, afebrile, nondiaphoretic HEENT: Pupils  unresponsive, small sutured hematoma over the left eye Cardio: RRR, no mrg's  Pulmonary: CTA bilaterally, no wheezing or crackles  Abdomen: Bowel sounds normal, soft MSK: BLE nontender, +1 edema BLE GU: Foley in place Neuro: Sedated on propofol, withdrawals to pain absent localization  I/O's: In 1L, Out  200cc  Assessment & Plan:  Septic shock, unknown source Pancytopenia Hypothermia Patient with distributive shock requiring pressors. Lactate 1.1. Etiology uncertain but initially to be sepsis given her pancytopenia and hypothermia. Anemia workup notable for B12 of 81.  Plan -Continue peripheral Levophed for now for MAP goal >65 -Wean propofol today for SBT -Consider precedex gtt if sedation needed (will await neurology recommendations) -Volume resuscitate as needed -Continue broad spectrum antibiotics -Follow up culture data -Replete Vit B12 -If mentation not improving, consider LP, MRI brain  Intubation for airway protection Toxic-metabolic encephalopathy Etiology uncertain. Possibly status epilepticus with prolonged post ictal state 2/2 to hypoglycemia vs sepsis. TSH and ammonia well WNL's.  -Maintain mechanical ventilation, target 6-8 cc/kg IBW -Daily SAT/SBT -Propofol for sedation per Neurology given seizure activity  Seizure activity Hypoglycemia Neurology on board and agree that seizure is possible in the setting of acute infection, hypothermia and hypoglycemia. The patient is s/p Keppra Load in ED. Neurology following and will order EEG. Repeat head CT absent intracranial abnormality.  - PRN Ativan for seizure activity - Continue D5-1/2NS  - Appreciate neurologies recommendations  Elevated LFTs Agree that this is a hepatocellular injury pattern that is likely related to underlying infectious illness vs hypotension.  - Obtain RUQ U/S - F/up hepatitis panel - Continue to trend LFTs  Best practice:  Diet: NPO, NGT in position, will start tube fees if she remains unable to wean from vent Pain/Anxiety/Delirium protocol (if indicated): Propofol gtt, fentanyl PRN VAP protocol (if indicated): Chlorhexidine DVT prophylaxis: Lovenox GI prophylaxis: H2 blocker Glucose control: SSI Mobility: N/A Code Status: Full Family Communication: Daughter, [name] Disposition: ICU  Labs   CBC: Recent Labs  Lab 02/01/19 1450 02/01/19 2329 02/01/19 2346 02/02/19 0115  WBC 1.1*  --  2.5*  --   NEUTROABS 0.4*  --   --   --   HGB 10.4* 9.2* 10.0* 10.2*  HCT 31.6* 27.0* 29.7* 30.0*  MCV 99.7  --  98.7  --   PLT 102*  --  108*  --    Basic Metabolic Panel: Recent Labs  Lab 02/01/19 1450 02/01/19 2329 02/01/19 2346 02/02/19 0115 02/02/19 0302  NA 139 143 141 142  --   K 4.4 4.4 4.4 4.7  --   CL 104  --  111  --   --   CO2 27  --  23  --   --   GLUCOSE 173*  --  107*  --   --   BUN 29*  --  31*  --   --   CREATININE 0.77  --  0.98  --   --   CALCIUM 9.5  --  8.9  --   --   MG  --   --   --   --  1.6*  PHOS  --   --   --   --  2.6   GFR: Estimated Creatinine Clearance: 34.8 mL/min (by C-G formula based on SCr of 0.98 mg/dL). Recent Labs  Lab 02/01/19 1433 02/01/19 1450 02/01/19 2346 02/02/19 0302  WBC  --  1.1* 2.5*  --   LATICACIDVEN 1.4  --  1.9 1.1   Liver Function  Tests: Recent Labs  Lab 02/01/19 1450 02/01/19 2346  AST 179* 180*  ALT 185* 171*  ALKPHOS 139* 123  BILITOT 0.6 0.6  PROT 5.8* 5.5*  ALBUMIN 2.9* 2.8*   No results for input(s): LIPASE, AMYLASE in the last 168 hours. Recent Labs  Lab 02/01/19 2348  AMMONIA 28   ABG    Component Value Date/Time   PHART 7.526 (H) 02/02/2019 0115   PCO2ART 32.2 02/02/2019 0115   PO2ART 373.0 (H) 02/02/2019 0115   HCO3 26.8 02/02/2019 0115   TCO2 28 02/02/2019 0115   O2SAT 100.0 02/02/2019 0115    Coagulation Profile: No results for input(s): INR, PROTIME in the last 168 hours.  Cardiac Enzymes: Recent Labs  Lab 02/01/19 1450 02/01/19 2346  CKTOTAL 132 111   HbA1C: Hgb A1c MFr Bld  Date/Time Value Ref Range Status  02/02/2019 03:02 AM 5.6 4.8 - 5.6 % Final    Comment:    (NOTE) Pre diabetes:          5.7%-6.4% Diabetes:              >6.4% Glycemic control for   <7.0% adults with diabetes   03/31/2017 11:44 AM 5.7 (H) <5.7 % of total Hgb  Final    Comment:    For someone without known diabetes, a hemoglobin  A1c value between 5.7% and 6.4% is consistent with prediabetes and should be confirmed with a  follow-up test. . For someone with known diabetes, a value <7% indicates that their diabetes is well controlled. A1c targets should be individualized based on duration of diabetes, age, comorbid conditions, and other considerations. . This assay result is consistent with an increased risk of diabetes. . Currently, no consensus exists regarding use of hemoglobin A1c for diagnosis of diabetes for children. .     CBG: Recent Labs  Lab 02/01/19 2319 02/01/19 2342 02/02/19 0025 02/02/19 0100 02/02/19 0245  GLUCAP 23* 120* 89 78 82   Review of Systems:   Unable to assess due to patient condition.  Past Medical History  She,  has a past medical history of Bilateral knee pain, Breast cancer ([place]), Dementia ([place]), Osteoporosis, and Urge incontinence of urine.   Surgical History    Past Surgical History:  Procedure Laterality Date  . BREAST LUMPECTOMY Right 2001  . BREAST LUMPECTOMY Right 12/2017  . BREAST LUMPECTOMY WITH SENTINEL LYMPH NODE BIOPSY Right 2001  . BREAST LUMPECTOMY WITH SENTINEL LYMPH NODE BIOPSY Right 12/30/2017   Procedure: RIGHT BREAST LUMPECTOMY WITH SENTINEL LYMPH NODE BIOPSY ERAS PATHWAY;  Surgeon: Stark Klein, MD;  Location: [facility];  Service: General;  Laterality: Right;  . DENTAL SURGERY      Social History   reports that she has never smoked. She has never used smokeless tobacco. She reports that she does not drink alcohol or use drugs.   Family History   Her family history includes Cancer in her mother.   Allergies Allergies  Allergen Reactions  . Milk-Related Compounds Diarrhea  . Pork-Derived Products Other (See Comments)    PATIENT DOES NOT EAT PORK    Home Medications  Prior to Admission medications   Medication Sig Start Date End Date Taking? Authorizing Provider   anastrozole (ARIMIDEX) 1 MG tablet Take 1 tablet (1 mg total) by mouth daily. 11/27/18  Yes Magrinat, Virgie Dad, MD  cholecalciferol (VITAMIN D3) 25 MCG (1000 UT) tablet Take 1 tablet (1,000 Units total) by mouth daily. 02/02/18  Yes Magrinat, Virgie Dad, MD  diclofenac  Sodium (VOLTAREN) 1 % GEL Apply 2-4 g topically See admin instructions. Apply 2-4 grams to both knees once daily for pain   Yes [provider]  donepezil (ARICEPT) 5 MG tablet TAKE 1 TABLET (5 MG TOTAL) BY MOUTH AT BEDTIME. FOR ALZHEIMER'S DISEASE Patient taking differently: Take 5 mg by mouth daily with breakfast. For Alzheimer's disease 01/29/19  Yes Sater, Nanine Means, MD  levETIRAcetam (KEPPRA) 250 MG tablet Take 1 tablet (250 mg total) by mouth 2 (two) times daily. Patient taking differently: Take 250 mg by mouth every morning.  01/08/19  Yes Sater, Nanine Means, MD  MELATONIN PO Take 1 tablet by mouth at bedtime as needed (for sleep).   Yes [provider]  Phenyleph-Doxylamine-DM-APAP (NYQUIL SEVERE COLD/FLU) 5-6.25-10-325 MG/15ML LIQD Take 5-10 mLs by mouth at bedtime as needed (for rhinitis).   Yes [provider]  QUEtiapine (SEROQUEL) 25 MG tablet TAKE 1 TABLET BY MOUTH EVERYDAY AT BEDTIME Patient taking differently: Take 25-50 mg by mouth at bedtime as needed (for sleep).  01/31/19  Yes Sater, Nanine Means, MD  sertraline (ZOLOFT) 25 MG tablet Take 1 tablet (25 mg total) by mouth daily. 12/05/18  Yes Sater, Nanine Means, MD  traMADol (ULTRAM) 50 MG tablet Take 1 tablet (50 mg total) by mouth every 6 (six) hours as needed for moderate pain. 08/25/18   Shelly Coss, MD     Critical care time:    Kathi Ludwig, MD [facility] Internal Medicine, PGY-3  Please see Attending A/P and/or Addendum for final recommendations.

## 2019-02-02 NOTE — Significant Event (Addendum)
Called to patient bedside for progressive hypotension. On assessment patient is obtunded. Glucose 23. Given amp dextrose. Systolic 60. Given 1L bolus. Without improvement. ABG obtained. 7.490/32.6/76. Patient appears to have rhythmic movement of eyes. Given 2 mg Ativan and 1 gram Keppra. Patient not protecting airway. ED Physician to intubate. CT Head ordered. Neurology consulted. Critical Care consulted.    Spoke with family. Confirms full code status.

## 2019-02-02 NOTE — Progress Notes (Signed)
Called and updated family on patients condition. Per patients daughter, her son is to visit her in the hospital today. All questions answered. We are to continue full scope of care.   Kathi Ludwig, MD [facility] Internal Medicine, PGY-3

## 2019-02-02 NOTE — Progress Notes (Signed)
Subjective: No further seizure like activity.  Exam: Vitals:   02/02/19 1000 02/02/19 1100  BP: 118/69 139/84  Pulse: 65 65  Resp: 16 16  Temp:    SpO2: 100% 100%   Gen: In bed, NAD Resp: non-labored breathing, no acute distress Abd: soft, nt  Neuro: Limited by sedation MS: Does not open eyes or follow commands CN: Pupils reactive bilaterally, corneals intact Motor: She minimally withdraws bilaterally in her upper extremities, better withdrawal lower extremities, she has prominent tremor bilaterally which is suppressible with repositioning Sensory: As above  Pertinent Labs: ESR 28 - unlikely to be significant, CRP - normal B12 81 - severe deficiency.  Ammonia 28 UA - negative TSH and T4, total  - nml Glucose reportedly 23 overnight Temp on arrival 85   Impression:  86 yo F with AMS of unclear etiology, possible sepsis. EEG with no ongoing seizure activity. I am not sure what caused her progressive decline for the past week. With neutropenia, hypoglycemia, hypotension, and hypothermia, I do not think that seizure is her only issue. She does have a history of seizure and I would continue keppra, and agree with increasing to '500mg'$  BID.   Recommendations: 1) Continue keppra '500mg'$  BID 2) Continue supportive measures per CCM.  3) Sedation only as needed for vent management.   Roland Rack, MD [facility] [phone]  If 7pm- 7am, please page neurology on call as listed in [place].

## 2019-02-02 NOTE — Consult Note (Signed)
NAME:  Jocelyn Mccann, MRN:  [id number], DOB:  [birth year], LOS: 1 ADMISSION DATE:  02/01/2019, CONSULTATION DATE:  02/02/19  REFERRING MD:  Dr. Hayes Mccann, CHIEF COMPLAINT:  Intubation for airway protection   Brief History   Jocelyn Mccann is a 86 y.o. female with h/o right breast ca on anastrozole, Alzheimer's dementia, seizure disorder and recent history of falls who presented with an unwitnessed fall and worsening confusion. She was initially triaged to [facility] but became obtunded during her ED stay after a witnessed seizure episode, and subsequently required intubation.  History of present illness   Jocelyn Mccann is a 86 y.o. female with h/o right breast ca on anastrozole, Alzheimer's dementia, seizure disorder and recent history of falls who presented to the ED on 1/21 after an unwitnessed fall and worsening confusion over the last week. This is her third ED visit in the last 7 days for a fall. She was previously seen on 1/20 for a fall and required sutures above her left eye. On presentation to the ED, she was hypothermic and bradycardic with recorded temp of 45F on arrival. Bair hugger was placed and cultures were obtained. She was then started on broad spectrum empiric antibiotics. Labs revealed pancytopenia with WBC 1.1 (400 neutrophils), Hgb 10.4 and plt 102. Mild elevation in LFTs also seen with AST 179 and ALT 185 on arrival. She has no known history of liver disease. Urinalysis was largely unremarkable. CXR showed no evidence for pneumonia.  While in the ED, she became more hypotensive and obtunded. She was found to have a glucose of 23 and had apparent seizure activity with rhythmic eye movements. She was given 2 mg Ativan and Neurology was consulted. She was then loaded with 1 g Keppra and the decision was made to proceed with intubation after discussion with the family. Etomidate and succinylcholine given for RSI and patient was successfully intubated by ED attending prior to  transfer.  Past Medical History  As above  Significant Hospital Events   Intubation 1/22  Consults:  Neurology  Procedures:  Intubation 1/22  Significant Diagnostic Tests:  CT HEAD WITHOUT CONTRAST & CT CERVICAL SPINE WITHOUT CONTRAST 02/01/2019 IMPRESSION: No change since yesterday.  Head CT: No acute or traumatic finding. Advanced atrophy, temporal lobe predominant. Chronic small-vessel changes.  Cervical spine CT: No acute or traumatic finding. Chronic degenerative cervical spondylosis and facet osteoarthritis.  PORTABLE CHEST 1 VIEW 02/01/2019 IMPRESSION: 1. No acute cardiopulmonary disease.  PORTABLE PELVIS 1-2 VIEWS 02/01/2019 IMPRESSION: Diffuse osteopenia. Degenerative changes lumbar spine and both hips. No acute abnormality identified.  Micro Data:  SARS CoV-2/Influenza PCR negative Blood cx pending Urine cx pending Trach aspirate pending  Antimicrobials:  Vancomycin, cefepime 1/21-   Objective   Blood pressure 131/73, pulse 82, temperature 97.7 F (36.5 C), temperature source Rectal, resp. rate 18, height 5\' 2"  (1.575 m), weight 61 kg, SpO2 100 %.    Vent Mode: PRVC FiO2 (%):  [100 %] 100 % Set Rate:  [20 bmp] 20 bmp Vt Set:  [400 mL] 400 mL PEEP:  [5 cmH20] 5 cmH20 Plateau Pressure:  [14 cmH20] 14 cmH20   Intake/Output Summary (Last 24 hours) at 02/02/2019 0125 Last data filed at 02/02/2019 0053 Gross per 24 hour  Intake 500 ml  Output --  Net 500 ml   Filed Weights   02/01/19 1340  Weight: 61 kg    Examination: General: intubated, sedated, elderly woman HENT: laceration above left eye with bruising Lungs: CTA b/l, no  WCR Cardiovascular: RRR, no MRG Abdomen: soft, NTND, NABS Extremities: no C/C/E, 2+ pedal pulses Neuro: sedated, not following commands, localizes to pain, moving all extremities3 GU: Foley in place  Assessment & Plan:  Ms. Jocelyn Mccann presents with septic shock of uncertain etiology in the setting of pancytopenia that  appears chronic. No evidence for pulmonary or urinary source of infection. Patient unable to relay additional symptoms. Given her neutropenia, she is at high risk for infection and is appropriately on broad spectrum antibiotics. Seizure provoked by hypoglycemia. Meningoencephalitis is possible but not high on the differential at this time. Pancytopenia appears to be a chronic issue at this point although etiology is not well established (?MDS?). Chronic neutropenia is worsened in the setting of acute infection. Low suspicion for tick-borne illness given season. Mild elevation in LFTs may also be in the setting of sepsis.  Septic shock, unknown source Pancytopenia Hypothermia - Continue peripheral Levophed for now for MAP goal >65 - Volume resuscitate - If pressor requirement increases further, will place CVC - Continue broad spectrum antibiotics - Follow up culture data - Follow up peripheral smear, anemia workup - If mentation not improving, consider LP, MRI brain - Consider Hematology consult  Intubation for airway protection Toxic-metabolic encephalopathy - Maintain mechanical ventilation, target 6-8 cc/kg IBW - Obtain post-intubation ABG - Daily SAT/SBT - Propofol for sedation per Neurology given seizure activity - TSH wnl, f/u ammonia, B12 levels  Seizure activity Hypoglycemia - Seizure activity provoked in setting of acute infection, hypoglycemia, hypothermia - s/p Keppra load in ED - Neurology following, plan for continuous EEG monitoring - Increase Keppra dose to 500 mg BID per Neurology - Neuro checks - Repeat CT head to ensure no interval change since last imaging given recent falls and seizure activity - PRN Ativan for seizure activity - Start D5-1/2NS drip for now; can consider trickle feeds if still hypoglycemic  Elevated LFTs - Hepatocellular injury pattern that is likely related to underlying infectious illness - Obtain RUQ U/S - Obtain hepatitis panel - Trend  LFTs  Best practice:  Diet: NPO Pain/Anxiety/Delirium protocol (if indicated): Propofol gtt, fentanyl PRN VAP protocol (if indicated): Chlorhexidine DVT prophylaxis: Lovenox GI prophylaxis: H2 blocker Glucose control: SSI Mobility: N/A Code Status: Full Family Communication: Daughter, Ibrahim,[name] Disposition: ICU  Labs   CBC: Recent Labs  Lab 01/26/19 0450 02/01/19 1450 02/01/19 2329 02/01/19 2346 02/02/19 0115  WBC 2.5* 1.1*  --  2.5*  --   NEUTROABS  --  0.4*  --   --   --   HGB 10.4* 10.4* 9.2* 10.0* 10.2*  HCT 31.5* 31.6* 27.0* 29.7* 30.0*  MCV 100.6* 99.7  --  98.7  --   PLT 147* 102*  --  108*  --     Basic Metabolic Panel: Recent Labs  Lab 01/26/19 0450 02/01/19 1450 02/01/19 2329 02/01/19 2346 02/02/19 0115  NA 145 139 143 141 142  K 4.7 4.4 4.4 4.4 4.7  CL 109 104  --  111  --   CO2 27 27  --  23  --   GLUCOSE 69* 173*  --  107*  --   BUN 34* 29*  --  31*  --   CREATININE 0.84 0.77  --  0.98  --   CALCIUM 9.6 9.5  --  8.9  --    GFR: Estimated Creatinine Clearance: 34.1 mL/min (by C-G formula based on SCr of 0.98 mg/dL). Recent Labs  Lab 01/26/19 0450 02/01/19 1433 02/01/19 1450 02/01/19 2346  WBC 2.5*  --  1.1* 2.5*  LATICACIDVEN  --  1.4  --  1.9    Liver Function Tests: Recent Labs  Lab 02/01/19 1450 02/01/19 2346  AST 179* 180*  ALT 185* 171*  ALKPHOS 139* 123  BILITOT 0.6 0.6  PROT 5.8* 5.5*  ALBUMIN 2.9* 2.8*   No results for input(s): LIPASE, AMYLASE in the last 168 hours. Recent Labs  Lab 02/01/19 2348  AMMONIA 28    ABG    Component Value Date/Time   PHART 7.526 (H) 02/02/2019 0115   PCO2ART 32.2 02/02/2019 0115   PO2ART 373.0 (H) 02/02/2019 0115   HCO3 26.8 02/02/2019 0115   TCO2 28 02/02/2019 0115   O2SAT 100.0 02/02/2019 0115     Coagulation Profile: No results for input(s): INR, PROTIME in the last 168 hours.  Cardiac Enzymes: Recent Labs  Lab 02/01/19 1450 02/01/19 2346  CKTOTAL 132 111     HbA1C: Hgb A1c MFr Bld  Date/Time Value Ref Range Status  03/31/2017 11:44 AM 5.7 (H) <5.7 % of total Hgb Final    Comment:    For someone without known diabetes, a hemoglobin  A1c value between 5.7% and 6.4% is consistent with prediabetes and should be confirmed with a  follow-up test. . For someone with known diabetes, a value <7% indicates that their diabetes is well controlled. A1c targets should be individualized based on duration of diabetes, age, comorbid conditions, and other considerations. . This assay result is consistent with an increased risk of diabetes. . Currently, no consensus exists regarding use of hemoglobin A1c for diagnosis of diabetes for children. .     CBG: Recent Labs  Lab 02/01/19 1947 02/01/19 2319 02/01/19 2342 02/02/19 0025 02/02/19 0100  GLUCAP 80 23* 120* 89 78    Review of Systems:   Unable to assess due to patient condition.  Past Medical History  She,  has a past medical history of Bilateral knee pain, Breast cancer ([place]), Dementia ([place]), Osteoporosis, and Urge incontinence of urine.   Surgical History    Past Surgical History:  Procedure Laterality Date  . BREAST LUMPECTOMY Right 2001  . BREAST LUMPECTOMY Right 12/2017  . BREAST LUMPECTOMY WITH SENTINEL LYMPH NODE BIOPSY Right 2001  . BREAST LUMPECTOMY WITH SENTINEL LYMPH NODE BIOPSY Right 12/30/2017   Procedure: RIGHT BREAST LUMPECTOMY WITH SENTINEL LYMPH NODE BIOPSY ERAS PATHWAY;  Surgeon: Stark Klein, MD;  Location: [facility];  Service: General;  Laterality: Right;  . DENTAL SURGERY       Social History   reports that she has never smoked. She has never used smokeless tobacco. She reports that she does not drink alcohol or use drugs.   Family History   Her family history includes Cancer in her mother.   Allergies Allergies  Allergen Reactions  . Milk-Related Compounds Diarrhea  . Pork-Derived Products Other (See Comments)    PATIENT DOES NOT EAT PORK      Home Medications  Prior to Admission medications   Medication Sig Start Date End Date Taking? Authorizing Provider  anastrozole (ARIMIDEX) 1 MG tablet Take 1 tablet (1 mg total) by mouth daily. 11/27/18  Yes Magrinat, Virgie Dad, MD  cholecalciferol (VITAMIN D3) 25 MCG (1000 UT) tablet Take 1 tablet (1,000 Units total) by mouth daily. 02/02/18  Yes Magrinat, Virgie Dad, MD  diclofenac Sodium (VOLTAREN) 1 % GEL Apply 2-4 g topically See admin instructions. Apply 2-4 grams to both knees once daily for pain   Yes [provider]  donepezil (ARICEPT) 5 MG tablet TAKE 1 TABLET (5 MG TOTAL) BY MOUTH AT BEDTIME. FOR ALZHEIMER'S DISEASE Patient taking differently: Take 5 mg by mouth daily with breakfast. For Alzheimer's disease 01/29/19  Yes Sater, Nanine Means, MD  levETIRAcetam (KEPPRA) 250 MG tablet Take 1 tablet (250 mg total) by mouth 2 (two) times daily. Patient taking differently: Take 250 mg by mouth every morning.  01/08/19  Yes Sater, Nanine Means, MD  MELATONIN PO Take 1 tablet by mouth at bedtime as needed (for sleep).   Yes [provider]  Phenyleph-Doxylamine-DM-APAP (NYQUIL SEVERE COLD/FLU) 5-6.25-10-325 MG/15ML LIQD Take 5-10 mLs by mouth at bedtime as needed (for rhinitis).   Yes [provider]  QUEtiapine (SEROQUEL) 25 MG tablet TAKE 1 TABLET BY MOUTH EVERYDAY AT BEDTIME Patient taking differently: Take 25-50 mg by mouth at bedtime as needed (for sleep).  01/31/19  Yes Sater, Nanine Means, MD  sertraline (ZOLOFT) 25 MG tablet Take 1 tablet (25 mg total) by mouth daily. 12/05/18  Yes Sater, Nanine Means, MD  traMADol (ULTRAM) 50 MG tablet Take 1 tablet (50 mg total) by mouth every 6 (six) hours as needed for moderate pain. 08/25/18   Shelly Coss, MD     Critical care time: 60 minutes

## 2019-02-02 NOTE — Progress Notes (Signed)
EEG complete - results pending 

## 2019-02-02 NOTE — Progress Notes (Signed)
  Echocardiogram 2D Echocardiogram has been performed.  Jocelyn Mccann 02/02/2019, 11:55 AM

## 2019-02-02 NOTE — Consult Note (Addendum)
Requesting Physician: Dr. Jonnie Finner    Chief Complaint: Seizure like activity  History obtained from: Chart     HPI:                                                                                                                                       Jocelyn Mccann is a 86 y.o. female with past medical history of breast cancer on treatment with anastrozole, dementia, seizures today emergency department earlier today after a fall. Patient also has been having worsening declining mentation for the past week but able to communicate her needs.  Work-up in the emergency department revealed patient was hypothermic noted to have a count of 1.1.  Patient was started on cefepime and vancomycin started on IV fluids.  UA negative for UTI and chest x-ray showed no consolidation.  Around midnight, patient noted to be getting hypotensive.  On assessment by nurse practitioner,  patient was obtunded.  Glucose was 23.  SBP was 60.  Patient noted to have rhythmic twitching  of the eyes along with nystagmus.  There is also some concern for rhythmic movements in bilateral upper extremities.  As patient was obtunded and not protecting her airway, patient was intubated by the ED physician.    On my assessment just prior to intubation, patient unresponsive, had right facial droop as well as appeared to be withdrawing the left side greater than the right.  No rhythmic twitching movements noted.  No forced gaze deviation.    Past Medical History:  Diagnosis Date  . Bilateral knee pain   . Breast cancer ([place])    diagnosed 11/2017  . Dementia ([place])   . Osteoporosis   . Urge incontinence of urine     Past Surgical History:  Procedure Laterality Date  . BREAST LUMPECTOMY Right 2001  . BREAST LUMPECTOMY Right 12/2017  . BREAST LUMPECTOMY WITH SENTINEL LYMPH NODE BIOPSY Right 2001  . BREAST LUMPECTOMY WITH SENTINEL LYMPH NODE BIOPSY Right 12/30/2017   Procedure: RIGHT BREAST LUMPECTOMY WITH SENTINEL LYMPH NODE  BIOPSY ERAS PATHWAY;  Surgeon: Stark Klein, MD;  Location: [facility];  Service: General;  Laterality: Right;  . DENTAL SURGERY      Family History  Problem Relation Age of Onset  . Cancer Mother        breast   Social History:  reports that she has never smoked. She has never used smokeless tobacco. She reports that she does not drink alcohol or use drugs.  Allergies:  Allergies  Allergen Reactions  . Milk-Related Compounds Diarrhea  . Pork-Derived Products Other (See Comments)    PATIENT DOES NOT EAT PORK    Medications:  I reviewed home medications   ROS:                                                                                                                                     Unable to obtain due to patient's mental status   Examination:                                                                                                      General: Appears well-developed and well-nourished.  Psych: Affect appropriate to situation Eyes: No scleral injection HENT: No OP obstrucion Head: Normocephalic.  Cardiovascular: Normal rate and regular rhythm.  Respiratory: Effort normal and breath sounds normal to anterior ascultation GI: Soft.  No distension. There is no tenderness.  Skin: WDI    Neurological Examination  Mental Status:Does not follow commands.  No verbalizations noted.  Patient does not respond to verbal stimuli.  Does not respond to deep sternal rub.   Cranial Nerves: II: Pupils are 4 mm bilaterally, reactive III,IV,VI: doll's response present.  No forced gaze deviation or nystagmus noted VII: Right facial droop seen VIII: patient does not respond to verbal stimuli IX,X: gag reflex present XI: trapezius strength unable to test bilaterally XII: tongue midline Motor: Localizes with left upper extremity, withdraws in the right upper  extremity Plantars: downgoing  Cerebellar: Unable to perform   Lab Results: Basic Metabolic Panel: Recent Labs  Lab 01/26/19 0450 02/01/19 1450 02/01/19 2329 02/01/19 2346 02/02/19 0115  NA 145 139 143 141 142  K 4.7 4.4 4.4 4.4 4.7  CL 109 104  --  111  --   CO2 27 27  --  23  --   GLUCOSE 69* 173*  --  107*  --   BUN 34* 29*  --  31*  --   CREATININE 0.84 0.77  --  0.98  --   CALCIUM 9.6 9.5  --  8.9  --     CBC: Recent Labs  Lab 01/26/19 0450 02/01/19 1450 02/01/19 2329 02/01/19 2346 02/02/19 0115  WBC 2.5* 1.1*  --  2.5*  --   NEUTROABS  --  0.4*  --   --   --   HGB 10.4* 10.4* 9.2* 10.0* 10.2*  HCT 31.5* 31.6* 27.0* 29.7* 30.0*  MCV 100.6* 99.7  --  98.7  --   PLT 147* 102*  --  108*  --     Coagulation Studies: No results for input(s): LABPROT, INR in the last 72 hours.  Imaging: CT Head Wo Contrast  Result Date: 02/01/2019 CLINICAL DATA:  Head and neck injury. EXAM: CT HEAD WITHOUT CONTRAST CT CERVICAL SPINE WITHOUT CONTRAST TECHNIQUE: Multidetector CT imaging of the head and cervical spine was performed following the standard protocol without intravenous contrast. Multiplanar CT image reconstructions of the cervical spine were also generated. COMPARISON:  Same examinations yesterday. FINDINGS: CT HEAD FINDINGS Brain: Generalized atrophy, with some temporal lobe predominance. No sign of acute infarction of the brainstem or cerebellum. Chronic small-vessel ischemic changes of the cerebral hemispheric white matter. Old small vessel basal ganglia infarctions. No mass lesion, hemorrhage, hydrocephalus or extra-axial collection. Vascular: There is atherosclerotic calcification of the major vessels at the base of the brain. Skull: Negative.  No skull fracture. Sinuses/Orbits: Clear/normal Other: None CT CERVICAL SPINE FINDINGS Alignment: No traumatic malalignment. Skull base and vertebrae: No fracture in the cervical or upper thoracic region. Soft tissues and spinal  canal: Negative Disc levels: Chronic degenerative spondylosis from C2-3 through C6-7. Facet osteoarthritis on the right at C2-3, C3-4 and C4-5 and on the left at C3-4, C4-5 and C7-T1. Upper chest: Negative Other: None IMPRESSION: No change since yesterday. Head CT: No acute or traumatic finding. Advanced atrophy, temporal lobe predominant. Chronic small-vessel changes. Cervical spine CT: No acute or traumatic finding. Chronic degenerative cervical spondylosis and facet osteoarthritis. Electronically Signed   By: Nelson Chimes M.D.   On: 02/01/2019 16:33   CT HEAD WO CONTRAST  Result Date: 01/31/2019 CLINICAL DATA:  Fall. EXAM: CT HEAD WITHOUT CONTRAST CT CERVICAL SPINE WITHOUT CONTRAST TECHNIQUE: Multidetector CT imaging of the head and cervical spine was performed following the standard protocol without intravenous contrast. Multiplanar CT image reconstructions of the cervical spine were also generated. COMPARISON:  Head, maxillofacial, and cervical spine CTs 01/26/2019 FINDINGS: CT HEAD FINDINGS Brain: There is no evidence of acute infarct, intracranial hemorrhage, mass, midline shift, or extra-axial fluid collection. Hypodensities in the cerebral white matter bilaterally are unchanged and nonspecific but compatible with moderate chronic small vessel ischemic disease. 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 There is severe bilateral mesial temporal lobe atrophy. Vascular: Calcified atherosclerosis at the skull base. No hyperdense vessel. Skull: No fracture or suspicious osseous lesion. Sinuses/Orbits: Visualized paranasal sinuses and mastoid air cells are clear intact globes. Moderate-sized hematoma superior and lateral to the left orbit. Other: None. CT CERVICAL SPINE FINDINGS Alignment: Straightening of the normal cervical lordosis with unchanged minimal anterolisthesis of C3 on C4. Skull base and vertebrae: No acute fracture identified within limitations of mild motion artifact. Soft tissues and  spinal canal: No prevertebral fluid or swelling. No visible canal hematoma. Disc levels: Unchanged cervical disc and facet degeneration with moderate right neural foraminal stenosis at C6-7 due to uncovertebral and facet spurring. Upper chest: Clear lung apices. Other: Mild diffuse thyroid heterogeneity without a dominant nodule. IMPRESSION: 1. No evidence of acute intracranial abnormality. 2. Moderate chronic small vessel ischemic disease. 3. Left periorbital hematoma. 4. No acute fracture or traumatic subluxation identified in the cervical spine. Electronically Signed   By: Logan Bores M.D.   On: 01/31/2019 07:37   CT Cervical Spine Wo Contrast  Result Date: 02/01/2019 CLINICAL DATA:  Head and neck injury. EXAM: CT HEAD WITHOUT CONTRAST CT CERVICAL SPINE WITHOUT CONTRAST TECHNIQUE: Multidetector CT imaging of the head and cervical spine was performed following the standard protocol without intravenous contrast. Multiplanar CT image reconstructions of the cervical spine were also generated. COMPARISON:  Same examinations yesterday. FINDINGS: CT HEAD FINDINGS Brain: Generalized  atrophy, with some temporal lobe predominance. No sign of acute infarction of the brainstem or cerebellum. Chronic small-vessel ischemic changes of the cerebral hemispheric white matter. Old small vessel basal ganglia infarctions. No mass lesion, hemorrhage, hydrocephalus or extra-axial collection. Vascular: There is atherosclerotic calcification of the major vessels at the base of the brain. Skull: Negative.  No skull fracture. Sinuses/Orbits: Clear/normal Other: None CT CERVICAL SPINE FINDINGS Alignment: No traumatic malalignment. Skull base and vertebrae: No fracture in the cervical or upper thoracic region. Soft tissues and spinal canal: Negative Disc levels: Chronic degenerative spondylosis from C2-3 through C6-7. Facet osteoarthritis on the right at C2-3, C3-4 and C4-5 and on the left at C3-4, C4-5 and C7-T1. Upper chest: Negative  Other: None IMPRESSION: No change since yesterday. Head CT: No acute or traumatic finding. Advanced atrophy, temporal lobe predominant. Chronic small-vessel changes. Cervical spine CT: No acute or traumatic finding. Chronic degenerative cervical spondylosis and facet osteoarthritis. Electronically Signed   By: Nelson Chimes M.D.   On: 02/01/2019 16:33   CT CERVICAL SPINE WO CONTRAST  Result Date: 01/31/2019 CLINICAL DATA:  Fall. EXAM: CT HEAD WITHOUT CONTRAST CT CERVICAL SPINE WITHOUT CONTRAST TECHNIQUE: Multidetector CT imaging of the head and cervical spine was performed following the standard protocol without intravenous contrast. Multiplanar CT image reconstructions of the cervical spine were also generated. COMPARISON:  Head, maxillofacial, and cervical spine CTs 01/26/2019 FINDINGS: CT HEAD FINDINGS Brain: There is no evidence of acute infarct, intracranial hemorrhage, mass, midline shift, or extra-axial fluid collection. Hypodensities in the cerebral white matter bilaterally are unchanged and nonspecific but compatible with moderate chronic small vessel ischemic disease. 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 There is severe bilateral mesial temporal lobe atrophy. Vascular: Calcified atherosclerosis at the skull base. No hyperdense vessel. Skull: No fracture or suspicious osseous lesion. Sinuses/Orbits: Visualized paranasal sinuses and mastoid air cells are clear intact globes. Moderate-sized hematoma superior and lateral to the left orbit. Other: None. CT CERVICAL SPINE FINDINGS Alignment: Straightening of the normal cervical lordosis with unchanged minimal anterolisthesis of C3 on C4. Skull base and vertebrae: No acute fracture identified within limitations of mild motion artifact. Soft tissues and spinal canal: No prevertebral fluid or swelling. No visible canal hematoma. Disc levels: Unchanged cervical disc and facet degeneration with moderate right neural foraminal stenosis at C6-7 due to  uncovertebral and facet spurring. Upper chest: Clear lung apices. Other: Mild diffuse thyroid heterogeneity without a dominant nodule. IMPRESSION: 1. No evidence of acute intracranial abnormality. 2. Moderate chronic small vessel ischemic disease. 3. Left periorbital hematoma. 4. No acute fracture or traumatic subluxation identified in the cervical spine. Electronically Signed   By: Logan Bores M.D.   On: 01/31/2019 07:37   DG Pelvis Portable  Result Date: 02/01/2019 CLINICAL DATA:  Fall. EXAM: PORTABLE PELVIS 1-2 VIEWS COMPARISON:  01/26/2019. FINDINGS: Diffuse osteopenia. Degenerative changes lumbar spine and both hips. No acute bony abnormality identified. No evidence of fracture. IMPRESSION: Diffuse osteopenia. Degenerative changes lumbar spine and both hips. No acute abnormality identified. Electronically Signed   By: Marcello Moores  Register   On: 02/01/2019 15:04   DG CHEST PORT 1 VIEW  Result Date: 02/02/2019 CLINICAL DATA:  Post intubation. EXAM: PORTABLE CHEST 1 VIEW COMPARISON:  Radiograph yesterday. FINDINGS: Endotracheal tube tip 2 cm from the carina. Tip and side port of the enteric tube below the diaphragm in the stomach. Right costophrenic angle is not included in the field of view, and there is overlying artifact in this region. Unchanged  heart size and mediastinal contours allowing for rotation. Slight increase in streaky bibasilar opacities, favoring atelectasis. No evidence of pneumothorax or large pleural effusion. Bones diffusely under mineralized. IMPRESSION: 1. Endotracheal tube tip 2 cm from the carina. Tip and side port of the enteric tube below the diaphragm in the stomach. 2. Increasing streaky bibasilar opacities, favoring atelectasis. 3. Right lateral aspect of the thorax not included in the field of view. Electronically Signed   By: Keith Rake M.D.   On: 02/02/2019 01:00   DG Chest Port 1 View  Result Date: 02/01/2019 CLINICAL DATA:  Fall. EXAM: PORTABLE CHEST 1 VIEW  COMPARISON:  01/26/2019. FINDINGS: Cardiac silhouette is normal in size. No mediastinal or hilar masses. Lungs are clear.  No pleural effusion or pneumothorax. Stable changes from right breast surgery. Skeletal structures are demineralized but grossly intact. IMPRESSION: 1. No acute cardiopulmonary disease. Electronically Signed   By: Lajean Manes M.D.   On: 02/01/2019 15:05   DG Knee Complete 4 Views Left  Result Date: 01/31/2019 CLINICAL DATA:  Fall. Leg pain. EXAM: LEFT KNEE - COMPLETE 4+ VIEW COMPARISON:  01/26/2019 FINDINGS: No acute fracture or dislocation is identified. Osteoarthrosis is again noted including prominent lateral compartment osteophytosis. There is likely a small persistent knee joint effusion. Mild soft tissue swelling is noted anterior to the knee. The bones are diffusely osteopenic. IMPRESSION: 1. No acute osseous abnormality identified. 2. Small knee joint effusion. 3. Advanced lateral compartment osteoarthrosis. Electronically Signed   By: Logan Bores M.D.   On: 01/31/2019 07:29   DG Hip Unilat W or Wo Pelvis 2-3 Views Left  Result Date: 01/31/2019 CLINICAL DATA:  Fall. Leg pain. EXAM: DG HIP (WITH OR WITHOUT PELVIS) 2-3V LEFT COMPARISON:  Pelvis radiograph 01/26/2019 FINDINGS: No acute fracture or hip dislocation is identified. A 1.5 cm oval calcification projects medial to the right femoral head, in a different position than on the prior study and extending beyond the hip joint, possibly within the overlying soft tissues. Lower lumbar spondylosis is noted. IMPRESSION: No evidence of acute osseous abnormality. Electronically Signed   By: Logan Bores M.D.   On: 01/31/2019 07:27     I have reviewed the above imaging : CT head from 4.30 pm.    ASSESSMENT AND PLAN  86 y.o. female with past medical history of breast cancer on treatment with anastrozole, dementia, seizures today emergency department earlier today after a fall.  Noted to be in sepsis on arrival, subsequently  developed hypoglycemia and hypotension with worsening mental status and clinical activity concerning for nonconvulsive status epilepticus.  On assessment patient has right facial droop, withdraws left side more than the right. Outside the window for TPA due to unclear last known if this were stroke.  Acute metabolic encephalopathy Possible status epilepticus Possible hypoglycemia induced seizures  Recommendations Repeat CT head Loaded 1 g of IV Keppra, start with 500 mg twice daily EEG in the morning : does not appear to be clinically seizing currently,  Continue treatment of sepsis, hypoglycemia per CCM MRI brian when stable Seizure precautions   CRITICAL CARE Performed by: Lanice Schwab Kahlee Metivier   Total critical care time: 35  minutes  Critical care time was exclusive of separately billable procedures and treating other patients.  Critical care was necessary to treat or prevent imminent or life-threatening deterioration.  Critical care was time spent personally by me on the following activities: development of treatment plan with patient and/or surrogate as well as nursing, discussions with consultants, evaluation  of patient's response to treatment, examination of patient, obtaining history from patient or surrogate, ordering and performing treatments and interventions, ordering and review of laboratory studies, ordering and review of radiographic studies, pulse oximetry and re-evaluation of patient's condition.   Judia Arnott [facility] Pager Number [phone]

## 2019-02-03 ENCOUNTER — Inpatient Hospital Stay (HOSPITAL_COMMUNITY): Payer: Medicare HMO

## 2019-02-03 DIAGNOSIS — A419 Sepsis, unspecified organism: Principal | ICD-10-CM

## 2019-02-03 DIAGNOSIS — R6521 Severe sepsis with septic shock: Secondary | ICD-10-CM

## 2019-02-03 LAB — COMPREHENSIVE METABOLIC PANEL
ALT: 148 U/L — ABNORMAL HIGH (ref 0–44)
AST: 143 U/L — ABNORMAL HIGH (ref 15–41)
Albumin: 2.2 g/dL — ABNORMAL LOW (ref 3.5–5.0)
Alkaline Phosphatase: 117 U/L (ref 38–126)
Anion gap: 6 (ref 5–15)
BUN: 29 mg/dL — ABNORMAL HIGH (ref 8–23)
CO2: 24 mmol/L (ref 22–32)
Calcium: 8.6 mg/dL — ABNORMAL LOW (ref 8.9–10.3)
Chloride: 112 mmol/L — ABNORMAL HIGH (ref 98–111)
Creatinine, Ser: 1.05 mg/dL — ABNORMAL HIGH (ref 0.44–1.00)
GFR calc Af Amer: 55 mL/min — ABNORMAL LOW (ref >60)
GFR calc non Af Amer: 47 mL/min — ABNORMAL LOW (ref >60)
Glucose, Bld: 119 mg/dL — ABNORMAL HIGH (ref 70–99)
Potassium: 4.3 mmol/L (ref 3.5–5.1)
Sodium: 142 mmol/L (ref 135–145)
Total Bilirubin: 0.6 mg/dL (ref 0.3–1.2)
Total Protein: 5.2 g/dL — ABNORMAL LOW (ref 6.5–8.1)

## 2019-02-03 LAB — CBC WITH DIFFERENTIAL/PLATELET
Abs Immature Granulocytes: 0.02 10*3/uL (ref 0.00–0.07)
Basophils Absolute: 0 10*3/uL (ref 0.0–0.1)
Basophils Relative: 0 %
Eosinophils Absolute: 0 10*3/uL (ref 0.0–0.5)
Eosinophils Relative: 0 %
HCT: 28.3 % — ABNORMAL LOW (ref 36.0–46.0)
Hemoglobin: 9.3 g/dL — ABNORMAL LOW (ref 12.0–15.0)
Immature Granulocytes: 1 %
Lymphocytes Relative: 13 %
Lymphs Abs: 0.5 10*3/uL — ABNORMAL LOW (ref 0.7–4.0)
MCH: 32.5 pg (ref 26.0–34.0)
MCHC: 32.9 g/dL (ref 30.0–36.0)
MCV: 99 fL (ref 80.0–100.0)
Monocytes Absolute: 0.4 10*3/uL (ref 0.1–1.0)
Monocytes Relative: 9 %
Neutro Abs: 3.2 10*3/uL (ref 1.7–7.7)
Neutrophils Relative %: 77 %
Platelets: 81 10*3/uL — ABNORMAL LOW (ref 150–400)
RBC: 2.86 MIL/uL — ABNORMAL LOW (ref 3.87–5.11)
RDW: 17.1 % — ABNORMAL HIGH (ref 11.5–15.5)
WBC: 4.1 10*3/uL (ref 4.0–10.5)
nRBC: 1 % — ABNORMAL HIGH (ref 0.0–0.2)

## 2019-02-03 LAB — TRIGLYCERIDES: Triglycerides: 23 mg/dL (ref <150)

## 2019-02-03 LAB — GLUCOSE, CAPILLARY
Glucose-Capillary: 108 mg/dL — ABNORMAL HIGH (ref 70–99)
Glucose-Capillary: 119 mg/dL — ABNORMAL HIGH (ref 70–99)
Glucose-Capillary: 107 mg/dL — ABNORMAL HIGH (ref 70–99)
Glucose-Capillary: 120 mg/dL — ABNORMAL HIGH (ref 70–99)
Glucose-Capillary: 132 mg/dL — ABNORMAL HIGH (ref 70–99)

## 2019-02-03 LAB — MAGNESIUM: Magnesium: 2.1 mg/dL (ref 1.7–2.4)

## 2019-02-03 MED ORDER — NOREPINEPHRINE 4 MG/250ML-% IV SOLN
INTRAVENOUS | Status: AC
  Administered 2019-02-03: 8 ug/min via INTRAVENOUS
  Filled 2019-02-03: qty 250

## 2019-02-03 NOTE — Progress Notes (Signed)
[facility] Progress Note Patient Name: Jocelyn Mccann DOB: September 13, 1928 MRN: [id number]   Date of Service  02/03/2019  HPI/Events of Note  ASked to review MRI : 1. Punctate acute/early subacute infarcts within the right thalamus, right thalamocapsular junction, right external capsule and right cerebellum. 2. Mild chronic small vessel ischemic disease. Chronic lacunar infarcts within the left caudate and left cerebellum. 3. 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 4. Additional microhemorrhages within the cerebral hemispheres in a more peripheral distribution, which may reflect sequela of cerebral amyloid angiopathy. 5. Small volume chronic subarachnoid blood products along the rightparietal lobe. 6. Moderate generalized parenchymal atrophy.  In addition bedside nurse states that L knee and l lower leg is bruised and swollen.    eICU Interventions  Will order: 1. D/C Lovenox 2. SCD to R leg. 3. X-ray L knee and L Fib-Tib.     Intervention Category Major Interventions: Other:  Lysle Dingwall 02/03/2019, 11:09 PM

## 2019-02-03 NOTE — Progress Notes (Addendum)
NAME:  Jocelyn Mccann, MRN:  [id number], DOB:  18-Jul-1928, LOS: 2 ADMISSION DATE:  02/01/2019, CONSULTATION DATE:  02/03/19  REFERRING MD:  Dr. Hayes Ludwig, CHIEF COMPLAINT:  Intubation for airway protection   Brief History   Jocelyn Mccann is a 86 y.o. female with h/o right breast ca on anastrozole, Alzheimer's dementia, seizure disorder and recent history of falls who presented with an unwitnessed fall and worsening confusion. She was initially triaged to [facility] but became obtunded during her ED stay after a witnessed seizure episode, and subsequently required intubation.   History of present illness   Jocelyn Mccann is a 86 y.o. female with h/o right breast ca on anastrozole, Alzheimer's dementia, seizure disorder and recent history of falls who presented to the ED on 1/21 after an unwitnessed fall and worsening confusion over the last week. This is her third ED visit in the last 7 days for a fall. She was previously seen on 1/20 for a fall and required sutures above her left eye. On presentation to the ED, she was hypothermic and bradycardic with recorded temp of 26F on arrival. Bair hugger was placed and cultures were obtained. She was then started on broad spectrum empiric antibiotics. Labs revealed pancytopenia with WBC 1.1 (400 neutrophils), Hgb 10.4 and plt 102. Mild elevation in LFTs also seen with AST 179 and ALT 185 on arrival. She has no known history of liver disease. Urinalysis was largely unremarkable. CXR showed no evidence for pneumonia.  While in the ED, she became more hypotensive and obtunded. She was found to have a glucose of 23 and had apparent seizure activity with rhythmic eye movements. She was given 2 mg Ativan and Neurology was consulted. She was then loaded with 1 g Keppra and the decision was made to proceed with intubation after discussion with the family. Etomidate and succinylcholine given for RSI and patient was successfully intubated by ED attending prior to  transfer.  Past Medical History   Past Medical History:  Diagnosis Date  . Bilateral knee pain   . Breast cancer ([place])    diagnosed 11/2017  . Dementia ([place])   . Osteoporosis   . Urge incontinence of urine    Significant Hospital Events   1/22: admit to ICU for sepsis; intubated and started on broad spectrum antibiotics 1/23: no acute overnight events, urine cx with klebsiella; MRSA PCR negative; discontinuing vancomycin; continues on vent support  Consults:  Neurology PCCM  Procedures:  Intubation 1/22  Significant Diagnostic Tests:  CT HEAD WITHOUT CONTRAST & CT CERVICAL SPINE WITHOUT CONTRAST 02/01/2019 IMPRESSION: Head CT: No acute or traumatic finding. Advanced atrophy, temporal lobe predominant. Chronic small-vessel changes. Cervical spine CT: No acute or traumatic finding. Chronic degenerative cervical spondylosis and facet osteoarthritis.  PORTABLE PELVIS 1-2 VIEWS 02/01/2019 IMPRESSION: Diffuse osteopenia. Degenerative changes lumbar spine and both hips. No acute abnormality identified.  CT HEAD WO CONTRAST 02/02/2019:  IMPRESSION: Right frontal scalp hematoma. No acute intracranial abnormality. Stable atrophy and chronic small vessel ischemia.  RUQ [place] 02/02/2019: IMPRESSION: 1. Normal sonographic appearance of the gallbladder. 2. Question of mild biliary dilatation for age, common bile duct measures 9 mm. Please note the common bile duct is not well-defined sonographically. 3. Simple cyst in the right lobe of the liver. Borderline steatosis.  EEG 1//22/2021: Severe diffuse encephalopathy; no seizures or epileptiform discharges seen throughout recording.   ECHO 02/02/2019:  LVEF 60-65%, no wall LV motion abnormalities; grade I diastolic dysfunction; moderate-severe tricuspid regurgitation  Micro Data:  1/21: SARS CoV-2/Influenza PCR negative 1/21: Blood cx> no growth x2days 1/21: Urine cx> Klebsiella pneumoniae  Trach aspirate pending 1/22: MRSA PCR  negative  Antimicrobials:  Vancomycin 1/21 >> Cefepime 1/21 >>  Objective   Blood pressure (!) 110/58, pulse 81, temperature 98.3 F (36.8 C), temperature source Oral, resp. rate 18, height 5\' 2"  (1.575 m), weight 67.6 kg, SpO2 100 %.    Vent Mode: PRVC FiO2 (%):  [40 %] 40 % Set Rate:  [16 bmp] 16 bmp Vt Set:  [400 mL] 400 mL PEEP:  [5 cmH20] 5 cmH20 Plateau Pressure:  [12 cmH20-15 cmH20] 12 cmH20   Intake/Output Summary (Last 24 hours) at 02/03/2019 0708 Last data filed at 02/03/2019 0500 Gross per 24 hour  Intake 1818.7 ml  Output 1100 ml  Net 718.7 ml   Filed Weights   02/01/19 1340 02/02/19 0240 02/03/19 0434  Weight: 61 kg 63.8 kg 67.6 kg   Examination: General: frail appearing elderly female, Intubated, sedated, in no acute distress, afebrile, nondiaphoretic HEENT: Pupils unresponsive, small sutured hematoma over the left eye Cardio: RRR, holosystolic murmur at left lower border  Pulmonary: CTA bilaterally, no wheezing or crackles  Abdomen: Bowel sounds normal, soft MSK: BLE nontender, generalized edema  GU: Foley in place Neuro: Sedated on propofol, withdraws to pain absent localization  Assessment & Plan:  Septic shock, likely secondary to UTI Pancytopenia Hypothermia Patient with distributive shock requiring pressors. Lactate 1.1.Sepsis given her pancytopenia and hypothermia. Anemia workup notable for B12 of 81. Urine culture with klebsiella spp. Blood cultures negative to date. At this time, can discontinue vancomycin as MRSA negative. Patient continues to require pressor support. Plan -Continue peripheral Levophed for now for MAP goal >65 -Volume resuscitate as needed -Continue cefepime  -If mentation not improving, consider LP, MRI brain  Intubation for airway protection Toxic-metabolic encephalopathy Possibly status epilepticus with prolonged post ictal state 2/2 to hypoglycemia vs sepsis. TSH and ammonia levels WNL's. Patient is off sedation but  minimally responsive to voice; grimacing to pain.  Plan:  -Maintain mechanical ventilation, target 6-8 cc/kg IBW -Daily SBT  Seizure activity Hypoglycemia The patient is s/p Keppra Load in ED. CT head negative for acute intracranial abnormalities. EEG with no ongoing seizure activity.  Plan:  - Neurology following, appreciate their recommendations - Continue keppra 500mg  bid - PRN Ativan for seizure activity - Continue D5-1/2NS   Elevated LFTs Hepatocellular injury pattern that is likely related to underlying infectious illness vs hypotension. Hepatitis panel negative. RUQ [place] without any acute findings. Patient's transaminitis likely secondary to shock liver in setting of shock.  - Continue to trend LFTs  Best practice:  Diet: NPO, NGT in position, will start tube fees if she remains unable to wean from vent Pain/Anxiety/Delirium protocol (if indicated): Propofol gtt, fentanyl PRN VAP protocol (if indicated): Chlorhexidine DVT prophylaxis: Lovenox GI prophylaxis: H2 blocker Glucose control: SSI Mobility: N/A Code Status: Full Family Communication: will update daughter, [name] Disposition: ICU  Labs   CBC: Recent Labs  Lab 02/01/19 1450 02/01/19 2329 02/01/19 2346 02/02/19 0115 02/03/19 0312  WBC 1.1*  --  2.5*  --  4.1  NEUTROABS 0.4*  --   --   --  3.2  HGB 10.4* 9.2* 10.0* 10.2* 9.3*  HCT 31.6* 27.0* 29.7* 30.0* 28.3*  MCV 99.7  --  98.7  --  99.0  PLT 102*  --  108*  --  81*   Basic Metabolic Panel: Recent Labs  Lab 02/01/19 1450 02/01/19 2329  02/01/19 2346 02/02/19 0115 02/02/19 0302 02/03/19 0312  NA 139 143 141 142  --  142  K 4.4 4.4 4.4 4.7  --  4.3  CL 104  --  111  --   --  112*  CO2 27  --  23  --   --  24  GLUCOSE 173*  --  107*  --   --  119*  BUN 29*  --  31*  --   --  29*  CREATININE 0.77  --  0.98  --   --  1.05*  CALCIUM 9.5  --  8.9  --   --  8.6*  MG  --   --   --   --  1.6* 2.1  PHOS  --   --   --   --  2.6  --     GFR: Estimated Creatinine Clearance: 33.4 mL/min (A) (by C-G formula based on SCr of 1.05 mg/dL (H)). Recent Labs  Lab 02/01/19 1433 02/01/19 1450 02/01/19 2346 02/02/19 0302 02/03/19 0312  WBC  --  1.1* 2.5*  --  4.1  LATICACIDVEN 1.4  --  1.9 1.1  --    Liver Function Tests: Recent Labs  Lab 02/01/19 1450 02/01/19 2346 02/03/19 0312  AST 179* 180* 143*  ALT 185* 171* 148*  ALKPHOS 139* 123 117  BILITOT 0.6 0.6 0.6  PROT 5.8* 5.5* 5.2*  ALBUMIN 2.9* 2.8* 2.2*   No results for input(s): LIPASE, AMYLASE in the last 168 hours. Recent Labs  Lab 02/01/19 2348  AMMONIA 28   ABG    Component Value Date/Time   PHART 7.526 (H) 02/02/2019 0115   PCO2ART 32.2 02/02/2019 0115   PO2ART 373.0 (H) 02/02/2019 0115   HCO3 26.8 02/02/2019 0115   TCO2 28 02/02/2019 0115   O2SAT 100.0 02/02/2019 0115    Coagulation Profile: No results for input(s): INR, PROTIME in the last 168 hours.  Cardiac Enzymes: Recent Labs  Lab 02/01/19 1450 02/01/19 2346  CKTOTAL 132 111   HbA1C: Hgb A1c MFr Bld  Date/Time Value Ref Range Status  02/02/2019 03:02 AM 5.6 4.8 - 5.6 % Final    Comment:    (NOTE) Pre diabetes:          5.7%-6.4% Diabetes:              >6.4% Glycemic control for   <7.0% adults with diabetes   03/31/2017 11:44 AM 5.7 (H) <5.7 % of total Hgb Final    Comment:    For someone without known diabetes, a hemoglobin  A1c value between 5.7% and 6.4% is consistent with prediabetes and should be confirmed with a  follow-up test. . For someone with known diabetes, a value <7% indicates that their diabetes is well controlled. A1c targets should be individualized based on duration of diabetes, age, comorbid conditions, and other considerations. . This assay result is consistent with an increased risk of diabetes. . Currently, no consensus exists regarding use of hemoglobin A1c for diagnosis of diabetes for children. .     CBG: Recent Labs  Lab  02/02/19 1124 02/02/19 1542 02/02/19 1956 02/02/19 2330 02/03/19 0415  GLUCAP 116* 103* 113* 99 108*    Critical care time:    Harvie Heck, MD Internal Medicine, PGY-1 Pager # [phone] 02/03/19   7:09 AM  Please see Attending A/P and/or Addendum for final recommendations.

## 2019-02-03 NOTE — Progress Notes (Signed)
Subjective: No further seizure like activity.  Exam: Vitals:   02/03/19 1512 02/03/19 1600  BP: 124/73   Pulse: 63 (!) 56  Resp: 18 17  Temp:    SpO2: 100% 100%   Gen: In bed, NAD Resp: non-labored breathing, no acute distress Abd: soft, nt  Neuro: Limited by sedation MS: Does not open eyes or follow commands CN: Pupils reactive bilaterally, corneals intact, eyes slightly dysconjugate.  Motor: She minimally withdraws bilaterally in her upper extremities, better withdrawal lower extremities, she has prominent tremor bilaterally which is suppressible with repositioning Sensory: As above  Pertinent Labs: ESR 28 - unlikely to be significant, CRP - normal B12 81 - severe deficiency.  Ammonia 28 UA - negative TSH and T4, total  - nml Glucose reportedly 23 overnight Temp on arrival 85   Impression:  86 yo F with AMS of unclear etiology, possible sepsis. EEG with no ongoing seizure activity. I am not sure what caused her progressive decline for the past week. With neutropenia, hypoglycemia, hypotension, and hypothermia, I do not think that seizure is her only issue. She does have a history of seizure and I would continue keppra, and agree with increasing to 519m BID. She is moving her arms less than legs, and I question a central cord syndrome. She is going for brain MRI, will get C-spine as well.   Recommendations: 1) Continue keppra 5025mBID 2) Continue supportive measures per CCM.  3) MRI  C-spine 4) will follow.   McRoland RackMD [facility] [phone] 7pm- 7am, please page neurology on call as [place] in [place]

## 2019-02-04 LAB — CBC WITH DIFFERENTIAL/PLATELET
Abs Immature Granulocytes: 0.02 10*3/uL (ref 0.00–0.07)
Basophils Absolute: 0 10*3/uL (ref 0.0–0.1)
Basophils Relative: 0 %
Eosinophils Absolute: 0 10*3/uL (ref 0.0–0.5)
Eosinophils Relative: 1 %
HCT: 26 % — ABNORMAL LOW (ref 36.0–46.0)
Hemoglobin: 8.7 g/dL — ABNORMAL LOW (ref 12.0–15.0)
Immature Granulocytes: 1 %
Lymphocytes Relative: 9 %
Lymphs Abs: 0.4 10*3/uL — ABNORMAL LOW (ref 0.7–4.0)
MCH: 33.1 pg (ref 26.0–34.0)
MCHC: 33.5 g/dL (ref 30.0–36.0)
MCV: 98.9 fL (ref 80.0–100.0)
Monocytes Absolute: 0.5 10*3/uL (ref 0.1–1.0)
Monocytes Relative: 12 %
Neutro Abs: 3.1 10*3/uL (ref 1.7–7.7)
Neutrophils Relative %: 77 %
Platelets: 59 10*3/uL — ABNORMAL LOW (ref 150–400)
RBC: 2.63 MIL/uL — ABNORMAL LOW (ref 3.87–5.11)
RDW: 17.1 % — ABNORMAL HIGH (ref 11.5–15.5)
WBC: 3.9 10*3/uL — ABNORMAL LOW (ref 4.0–10.5)
nRBC: 0.8 % — ABNORMAL HIGH (ref 0.0–0.2)

## 2019-02-04 LAB — URINE CULTURE: Culture: 50000 — AB

## 2019-02-04 LAB — COMPREHENSIVE METABOLIC PANEL
ALT: 129 U/L — ABNORMAL HIGH (ref 0–44)
AST: 111 U/L — ABNORMAL HIGH (ref 15–41)
Albumin: 2.1 g/dL — ABNORMAL LOW (ref 3.5–5.0)
Alkaline Phosphatase: 106 U/L (ref 38–126)
Anion gap: 6 (ref 5–15)
BUN: 35 mg/dL — ABNORMAL HIGH (ref 8–23)
CO2: 22 mmol/L (ref 22–32)
Calcium: 8.6 mg/dL — ABNORMAL LOW (ref 8.9–10.3)
Chloride: 112 mmol/L — ABNORMAL HIGH (ref 98–111)
Creatinine, Ser: 0.8 mg/dL (ref 0.44–1.00)
GFR calc Af Amer: >60 mL/min (ref >60)
GFR calc non Af Amer: >60 mL/min (ref >60)
Glucose, Bld: 103 mg/dL — ABNORMAL HIGH (ref 70–99)
Potassium: 3.8 mmol/L (ref 3.5–5.1)
Sodium: 140 mmol/L (ref 135–145)
Total Bilirubin: 0.9 mg/dL (ref 0.3–1.2)
Total Protein: 4.9 g/dL — ABNORMAL LOW (ref 6.5–8.1)

## 2019-02-04 LAB — FOLATE RBC
Folate, Hemolysate: 214 ng/mL
Folate, RBC: 740 ng/mL (ref >498)
Hematocrit: 28.9 % — ABNORMAL LOW (ref 34.0–46.6)

## 2019-02-04 LAB — HEPATITIS B SURFACE ANTIGEN: Hepatitis B Surface Ag: NONREACTIVE

## 2019-02-04 LAB — GLUCOSE, CAPILLARY
Glucose-Capillary: 104 mg/dL — ABNORMAL HIGH (ref 70–99)
Glucose-Capillary: 107 mg/dL — ABNORMAL HIGH (ref 70–99)
Glucose-Capillary: 115 mg/dL — ABNORMAL HIGH (ref 70–99)
Glucose-Capillary: 123 mg/dL — ABNORMAL HIGH (ref 70–99)
Glucose-Capillary: 92 mg/dL (ref 70–99)
Glucose-Capillary: 93 mg/dL (ref 70–99)
Glucose-Capillary: 96 mg/dL (ref 70–99)

## 2019-02-04 LAB — TRIGLYCERIDES: Triglycerides: 15 mg/dL (ref <150)

## 2019-02-04 LAB — HIV ANTIBODY (ROUTINE TESTING W REFLEX): HIV Screen 4th Generation wRfx: NONREACTIVE

## 2019-02-04 MED ORDER — WHITE PETROLATUM EX OINT
TOPICAL_OINTMENT | CUTANEOUS | Status: AC
  Filled 2019-02-04: qty 28.35

## 2019-02-04 MED ORDER — SODIUM CHLORIDE 0.9 % IV SOLN
1.0000 g | INTRAVENOUS | Status: AC
  Administered 2019-02-04 – 2019-02-06 (×3): 1 g via INTRAVENOUS
  Filled 2019-02-04: qty 10
  Filled 2019-02-04 (×2): qty 1

## 2019-02-04 MED ORDER — LACTATED RINGERS IV BOLUS
500.0000 mL | Freq: Once | INTRAVENOUS | Status: AC
  Administered 2019-02-04: 500 mL via INTRAVENOUS

## 2019-02-04 NOTE — Progress Notes (Addendum)
Patient seen and examined with resident. -No events overnight, doing well with PS -Lower ext move briskly to pain, UE don't move much at all -In MRI yesterday she was apparently moving everything and combative -Blood sugars have been improved -EEG shows no signs of seizures -Head CT unchanged old insults -MRI with a number of abnormalities: some old subarachnoid blood, tiny punctate restriction defects on R thalamus/internal capsule/cerebellum, sequelae of longstanding uncontrolled hypertension, discussed with neurology: not enough to explain current clinical status -Basic chemistries notable for a transaminitis, improving -CBC notable for pancytopenia, this appears to be an old issue without a clear explanation -Urine culture growing ceftriaxone-sensistive Klebsiella, urinalysis barely positive  Assessment/Plan # Metabolic vs. Anoxic encephalopathy- Initially had an unwitnessed fall and confusion as well as hypoglycemia and hypotension.  Urinalysis was slightly positive.  There is been no evidence of seizures.  MRI brain with evidence of longstanding hypertensive brain injuries with associated atrophy.  Current level of encephalopathy and neuro exam remains unexplained by imaging at this time (MRI neck also unrevealing).  Ammonia normal.  Sugars now normal.  Awaiting guidance from neurology but would just give more time from my standpoint.  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 # Respiratory failure- related to inability to protect airway.  Does have good gag and cough, may consider extubation trial but want to touch base with daughter first. # Pancytopenia- she has leukopenia dating back over a year but now worsening thrombocytopenia.  Iron labs look okay.  Sed rate WNL.  This may warrant outpatient referral to hematology for further workup.  Will check HIV and hep panel here. # Transaminitis- improving, suspected related to hypotension, f/u hep panel.  RUQ [place]  unrevealing. # History of breast cancer- 2001 resected w/o adjuvant chemo/radiation/anti-estrogens, on anastrozole for localized recurrence # Dementia- likely from chronic uncontrolled hypertension, may make insults like hypoglycemia take longer to recover from.  43 minutes critical care time

## 2019-02-04 NOTE — Progress Notes (Signed)
Neurology Progress Note   S:// Patient seen and examined   O:// Current vital signs: BP (!) 98/50   Pulse 77   Temp 98.7 F (37.1 C) (Oral)   Resp 16   Ht '5\' 2"'$  (1.575 m)   Wt 67.6 kg   SpO2 100%   BMI 27.26 kg/m  Vital signs in last 24 hours: Temp:  [94.2 F (34.6 C)-98.7 F (37.1 C)] 98.7 F (37.1 C) (01/24 0745) Pulse Rate:  [56-85] 77 (01/24 0800) Resp:  [14-41] 16 (01/24 0800) BP: (75-135)/(46-87) 98/50 (01/24 0800) SpO2:  [100 %] 100 % (01/24 0800) FiO2 (%):  [40 %] 40 % (01/24 0710) Neurological exam Mental status: There is no open eyes to voice or noxious stimulation Nerves: Pupils equal round reactive light, corneals bilaterally intact. Motor exam: She has withdrawal versus triple flexion in both lower extremities that is pretty strong but no response to noxious stimulation in upper extremities. Sensory exam as above. Remains intubated-no sedation-was weaning this morning.  Medications  Current Facility-Administered Medications:  .  acetaminophen (TYLENOL) tablet 650 mg, 650 mg, Oral, Q6H PRN **OR** acetaminophen (TYLENOL) suppository 650 mg, 650 mg, Rectal, Q6H PRN, Schertz, Michele Mcalpine, MD .  bisacodyl (DULCOLAX) suppository 10 mg, 10 mg, Rectal, Daily PRN, Schertz, Michele Mcalpine, MD .  ceFEPIme (MAXIPIME) 2 g in sodium chloride 0.9 % 100 mL IVPB, 2 g, Intravenous, Q12H, Bennie Pierini, MD, Last Rate: 200 mL/hr at 02/04/19 0704, 2 g at 02/04/19 0704 .  chlorhexidine gluconate (MEDLINE KIT) (PERIDEX) 0.12 % solution 15 mL, 15 mL, Mouth Rinse, BID, Jonnie Finner, Michele Mcalpine, MD, 15 mL at 02/04/19 6073 .  Chlorhexidine Gluconate Cloth 2 % PADS 6 each, 6 each, Topical, Q0600, Bennie Pierini, MD, 6 each at 02/03/19 [phone] .  dextrose 5 %-0.45 % sodium chloride infusion, , Intravenous, Continuous, Harbrecht, Lawrence, MD, Last Rate: 75 mL/hr at 02/04/19 0600, Rate Verify at 02/04/19 0600 .  docusate (COLACE) 50 MG/5ML liquid 100 mg, 100 mg, Per Tube, BID PRN, Bennie Pierini, MD .  famotidine (PEPCID) 40 MG/5ML suspension 20 mg, 20 mg, Per Tube, BID, Jonnie Finner Michele Mcalpine, MD, 20 mg at 02/03/19 2103 .  feeding supplement (PRO-STAT SUGAR FREE 64) liquid 30 mL, 30 mL, Per Tube, Daily, Spero Geralds, MD, 30 mL at 02/03/19 0945 .  feeding supplement (VITAL HIGH PROTEIN) liquid 1,000 mL, 1,000 mL, Per Tube, Q24H, Spero Geralds, MD, 1,000 mL at 02/03/19 1511 .  fentaNYL (SUBLIMAZE) injection 25 mcg, 25 mcg, Intravenous, Q15 min PRN, Schertz, Michele Mcalpine, MD .  fentaNYL (SUBLIMAZE) injection 25-100 mcg, 25-100 mcg, Intravenous, Q30 min PRN, Bennie Pierini, MD, 100 mcg at 02/03/19 1620 .  insulin aspart (novoLOG) injection 0-9 Units, 0-9 Units, Subcutaneous, Q4H, Schertz, Michele Mcalpine, MD, 1 Units at 02/04/19 0024 .  levETIRAcetam (KEPPRA) IVPB 500 mg/100 mL premix, 500 mg, Intravenous, Q12H, Schertz, Michele Mcalpine, MD, Stopped at 02/04/19 0031 .  LORazepam (ATIVAN) injection 2 mg, 2 mg, Intravenous, Q1H PRN, Bennie Pierini, MD .  MEDLINE mouth rinse, 15 mL, Mouth Rinse, 10 times per day, Bennie Pierini, MD, 15 mL at 02/04/19 [phone] .  norepinephrine (LEVOPHED) '4mg'$  in 262m premix infusion, 0-40 mcg/min, Intravenous, Continuous, SBennie Pierini MD, Last Rate: 3.75 mL/hr at 02/04/19 0615, 1 mcg/min at 02/04/19 0615 .  ondansetron (ZOFRAN) tablet 4 mg, 4 mg, Oral, Q6H PRN **OR** ondansetron (ZOFRAN) injection 4 mg, 4 mg, Intravenous, Q6H PRN, SJonnie Finner AMichele Mcalpine MD .  propofol (DIPRIVAN) 1000 MG/100ML infusion, 0-50 mcg/kg/min, Intravenous, Continuous, Schertz, Michele Mcalpine, MD, Stopped at 02/02/19 1043 .  QUEtiapine (SEROQUEL) tablet 25 mg, 25 mg, Oral, QHS, Bennie Pierini, MD, 50 mg at 02/03/19 2103 .  sertraline (ZOLOFT) tablet 25 mg, 25 mg, Oral, Daily, Bennie Pierini, MD, 25 mg at 02/03/19 0945 Labs CBC    Component Value Date/Time   WBC 3.9 (L) 02/04/2019 0230   RBC 2.63 (L) 02/04/2019 0230   HGB 8.7 (L) 02/04/2019 0230   HCT 26.0 (L) 02/04/2019 0230    PLT 59 (L) 02/04/2019 0230   MCV 98.9 02/04/2019 0230   MCH 33.1 02/04/2019 0230   MCHC 33.5 02/04/2019 0230   RDW 17.1 (H) 02/04/2019 0230   LYMPHSABS 0.4 (L) 02/04/2019 0230   MONOABS 0.5 02/04/2019 0230   EOSABS 0.0 02/04/2019 0230   BASOSABS 0.0 02/04/2019 0230    CMP     Component Value Date/Time   NA 140 02/04/2019 0230   K 3.8 02/04/2019 0230   CL 112 (H) 02/04/2019 0230   CO2 22 02/04/2019 0230   GLUCOSE 103 (H) 02/04/2019 0230   BUN 35 (H) 02/04/2019 0230   CREATININE 0.80 02/04/2019 0230   CREATININE 0.85 03/31/2017 1144   CALCIUM 8.6 (L) 02/04/2019 0230   PROT 4.9 (L) 02/04/2019 0230   ALBUMIN 2.1 (L) 02/04/2019 0230   AST 111 (H) 02/04/2019 0230   ALT 129 (H) 02/04/2019 0230   ALKPHOS 106 02/04/2019 0230   BILITOT 0.9 02/04/2019 0230   GFRNONAA >60 02/04/2019 0230   GFRNONAA 62 03/31/2017 1144   GFRAA >60 02/04/2019 0230   GFRAA 71 03/31/2017 1144    glycosylated hemoglobin  Lipid Panel     Component Value Date/Time   TRIG 15 02/04/2019 0230     Imaging I have reviewed images in epic and the results pertinent to this consultation are: MRI of the brain reviewed.  Punctate acute early subacute infarcts in the right thalamus right thalamocapsular junction right external capsule and right cerebellum.  Mild chronic small vessel disease.  Chronic lacunar infarcts in the left caudate and left cerebellum.  Numerous microhemorrhages in the posterior fossa and central predominant likely reflecting sequela of hypertensive angiopathy.  Small volume chronic subarachnoid products in the right parietal lobe. MRI of the cervical spine is a motion degraded exam but no definitive intrinsic cord abnormality.  Multilevel degenerative disc disease with greatest stenoses at C3-4 and C4-5 and some central disc protrusion at C4-5 without any evidence of myelomalacia or myelopathy.  Assessment: 86 year old with altered mental status of unclear etiology, possibly related to sepsis  with an EEG that did not show seizures.  According to history she has had progressive decline over the past 1 week, presented with neutropenia, hypoglycemia, hypothermia and hypotension. It is unlikely that seizure alone would have done this-I suspect seizure is probably a sequela of the hypoglycemia.   Impression: Multifactorial toxic metabolic encephalopathy  Recommendations: Keppra 500 twice daily Supportive care per CCM Sedation only for vent management if needed We will continue to follow with you.  I think she would need time before she starts showing improvement given multiple derangements that were present on presentation.  Discussed my plan with Dr. Tamala Julian.   -- Amie Portland, MD [facility] Pager: [phone] If 7pm to 7am, please call on call as listed on [facility].  CRITICAL CARE ATTESTATION Performed by: Amie Portland, MD Total critical care time:30 minutes Critical care time was exclusive of separately billable procedures  and treating other patients and/or supervising APPs/Residents/Students Critical care was necessary to treat or prevent imminent or life-threatening deterioration due to toxic metabolic encephalopathy This patient is critically ill and at significant risk for neurological worsening and/or death and care requires constant monitoring. Critical care was time spent personally by me on the following activities: development of treatment plan with patient and/or surrogate as well as nursing, discussions with consultants, evaluation of patient's response to treatment, examination of patient, obtaining history from patient or surrogate, ordering and performing treatments and interventions, ordering and review of laboratory studies, ordering and review of radiographic studies, pulse oximetry, re-evaluation of patient's condition, participation in multidisciplinary rounds and medical decision making of high complexity in the care of this patient.

## 2019-02-04 NOTE — Procedures (Signed)
Extubation Procedure Note  Patient Details:   Name: JERNI DARWIN DOB: 02-Sep-1928 MRN: [id number]   Airway Documentation:    Vent end date: 02/04/19 Vent end time: 1400   Evaluation  O2 sats: stable throughout Complications: No apparent complications Patient did tolerate procedure well. Bilateral Breath Sounds: Diminished, Clear   No   PT was extubated to a 3L [place]   Jadda Hunsucker, Leonie Douglas 02/04/2019, 3:11 PM

## 2019-02-04 NOTE — Progress Notes (Signed)
NAME:  Jocelyn Mccann, MRN:  [id number], DOB:  1928/05/17, LOS: 3 ADMISSION DATE:  02/01/2019, CONSULTATION DATE:  02/04/19  REFERRING MD:  Dr. Hayes Ludwig, CHIEF COMPLAINT:  Intubation for airway protection   Brief History   Jocelyn Mccann is a 86 y.o. female with h/o right breast ca on anastrozole, Alzheimer's dementia, seizure disorder and recent history of falls who presented with an unwitnessed fall and worsening confusion. She was initially triaged to [facility] but became obtunded during her ED stay after a witnessed seizure episode, and subsequently required intubation.   History of present illness   Jocelyn Mccann is a 86 y.o. female with h/o right breast ca on anastrozole, Alzheimer's dementia, seizure disorder and recent history of falls who presented to the ED on 1/21 after an unwitnessed fall and worsening confusion over the last week. This is her third ED visit in the last 7 days for a fall. She was previously seen on 1/20 for a fall and required sutures above her left eye. On presentation to the ED, she was hypothermic and bradycardic with recorded temp of 65F on arrival. Bair hugger was placed and cultures were obtained. She was then started on broad spectrum empiric antibiotics. Labs revealed pancytopenia with WBC 1.1 (400 neutrophils), Hgb 10.4 and plt 102. Mild elevation in LFTs also seen with AST 179 and ALT 185 on arrival. She has no known history of liver disease. Urinalysis was largely unremarkable. CXR showed no evidence for pneumonia.  While in the ED, she became more hypotensive and obtunded. She was found to have a glucose of 23 and had apparent seizure activity with rhythmic eye movements. She was given 2 mg Ativan and Neurology was consulted. She was then loaded with 1 g Keppra and the decision was made to proceed with intubation after discussion with the family. Etomidate and succinylcholine given for RSI and patient was successfully intubated by ED attending prior to  transfer.  Past Medical History   Past Medical History:  Diagnosis Date  . Bilateral knee pain   . Breast cancer ([place])    diagnosed 11/2017  . Dementia ([place])   . Osteoporosis   . Urge incontinence of urine    Significant Hospital Events   1/22: admit to ICU for sepsis; intubated and started on broad spectrum antibiotics 1/23: no acute overnight events, urine cx with klebsiella; MRSA PCR negative; discontinuing vancomycin; continues on vent support  Consults:  Neurology PCCM  Procedures:  Intubation 1/22  Significant Diagnostic Tests:  CT HEAD WITHOUT CONTRAST & CT CERVICAL SPINE WITHOUT CONTRAST 02/01/2019 IMPRESSION: Head CT: No acute or traumatic finding. Advanced atrophy, temporal lobe predominant. Chronic small-vessel changes. Cervical spine CT: No acute or traumatic finding. Chronic degenerative cervical spondylosis and facet osteoarthritis.  PORTABLE PELVIS 1-2 VIEWS 02/01/2019 IMPRESSION: Diffuse osteopenia. Degenerative changes lumbar spine and both hips. No acute abnormality identified.  CT HEAD WO CONTRAST 02/02/2019:  IMPRESSION: Right frontal scalp hematoma. No acute intracranial abnormality. Stable atrophy and chronic small vessel ischemia.  RUQ [place] 02/02/2019: IMPRESSION: 1. Normal sonographic appearance of the gallbladder. 2. Question of mild biliary dilatation for age, common bile duct measures 9 mm. Please note the common bile duct is not well-defined sonographically. 3. Simple cyst in the right lobe of the liver. Borderline steatosis.  EEG 1//22/2021: Severe diffuse encephalopathy; no seizures or epileptiform discharges seen throughout recording.   ECHO 02/02/2019:  LVEF 60-65%, no wall LV motion abnormalities; grade I diastolic dysfunction; moderate-severe tricuspid regurgitation  Micro Data:  1/21: SARS CoV-2/Influenza PCR negative 1/21: Blood cx> no growth x2days 1/21: Urine cx> Klebsiella pneumoniae  Trach aspirate pending 1/22: MRSA PCR  negative  Antimicrobials:  Vancomycin 1/21 >> 1/23 Cefepime 1/21 >>  Objective   Blood pressure (!) 104/52, pulse 61, temperature (!) 94.4 F (34.7 C), temperature source Oral, resp. rate 17, height 5\' 2"  (1.575 m), weight 67.6 kg, SpO2 100 %.    Vent Mode: PRVC FiO2 (%):  [40 %] 40 % Set Rate:  [16 bmp] 16 bmp Vt Set:  [400 mL] 400 mL PEEP:  [5 cmH20] 5 cmH20 Pressure Support:  [10 cmH20] 10 cmH20 Plateau Pressure:  [13 cmH20-17 cmH20] 15 cmH20   Intake/Output Summary (Last 24 hours) at 02/04/2019 0707 Last data filed at 02/04/2019 [id number] Gross per 24 hour  Intake 2874.88 ml  Output 700 ml  Net 2174.88 ml   Filed Weights   02/01/19 1340 02/02/19 0240 02/03/19 0434  Weight: 61 kg 63.8 kg 67.6 kg   Examination: General: No response to verbal stimuli, intubated, not sedated, in no acute distress, afebrile, nondiaphoretic HEENT: Atraumatic, PERRL Cardio: RRR, no mrg's Pulmonary: CTA bilaterally, no wheezing or crackles  Abdomen: Bowel sounds normal, soft, nontender  MSK: BLE nontender, nonedematous Neuro: Patient with minimal response to stimuli including withdrawal to painful stimuli Psych: Unable to assess Skin: Warm to touch  Assessment & Plan:  Septic shock, likely secondary to UTI Pancytopenia Hypothermia Patient with distributive shock requiring pressors. Lactate 1.1. Sepsis most likely given her pancytopenia and hypothermia. Anemia workup notable for B12 of 81. Urine culture with klebsiella spp. Blood cultures negative to date. Patient continues to require pressor support. Plan -Continue peripheral Levophed for now for MAP goal >65 -Volume resuscitate as needed -Continue cefepime  -Discontinued Vanc -If mentation not improving will need to have [place] discussion with daughter  CVA On brain MRI. Read as acute or recent subacute. Neurology following. Appreciate their recommendations.  Intubation for airway protection Acute encephalopathy Possibly status epilepticus  with prolonged post ictal state 2/2 to hypoglycemia vs sepsis vs CVA. 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 TSH and ammonia levels WNL's. Patient is off sedation but minimally responsive to voice; grimacing to pain.  Plan:  -Maintain mechanical ventilation, target 6-8 cc/kg IBW -Daily SBT -Discontinue sedation  Seizure activity Hypoglycemia The patient is s/p Keppra Load in ED. CT head negative for acute intracranial abnormalities. EEG with no ongoing seizure activity.  Plan:  - Neurology following, appreciate their recommendations - Continue keppra 500mg  bid - PRN Ativan for seizure activity - Continue D5-1/2NS   Elevated LFTs Hepatocellular injury pattern that is likely related to underlying infectious illness vs hypotension. Hepatitis panel negative. RUQ [place] without any acute findings. Patient's transaminitis likely secondary to shock liver in setting of shock.  - Continue to trend LFTs  Best practice:  Diet: NPO, NGT in position, will start tube fees if she remains unable to wean from vent Pain/Anxiety/Delirium protocol (if indicated): Propofol gtt, fentanyl PRN VAP protocol (if indicated): Chlorhexidine DVT prophylaxis: Lovenox GI prophylaxis: H2 blocker Glucose control: SSI Mobility: N/A Code Status: Full Family Communication: will update daughter, [name] Disposition: ICU  Labs   CBC: Recent Labs  Lab 02/01/19 1450 02/01/19 1450 02/01/19 2329 02/01/19 2346 02/02/19 0115 02/03/19 0312 02/04/19 0230  WBC 1.1*  --   --  2.5*  --  4.1 3.9*  NEUTROABS 0.4*  --   --   --   --  3.2 3.1  HGB 10.4*   < >  9.2* 10.0* 10.2* 9.3* 8.7*  HCT 31.6*   < > 27.0* 29.7* 30.0* 28.3* 26.0*  MCV 99.7  --   --  98.7  --  99.0 98.9  PLT 102*  --   --  108*  --  81* 59*   < > = values in this interval not displayed.   Basic Metabolic Panel: Recent Labs  Lab 02/01/19 1450 02/01/19 1450 02/01/19 2329 02/01/19 2346 02/02/19 0115 02/02/19 0302 02/03/19 0312 02/04/19 0230   NA 139   < > 143 141 142  --  142 140  K 4.4   < > 4.4 4.4 4.7  --  4.3 3.8  CL 104  --   --  111  --   --  112* 112*  CO2 27  --   --  23  --   --  24 22  GLUCOSE 173*  --   --  107*  --   --  119* 103*  BUN 29*  --   --  31*  --   --  29* 35*  CREATININE 0.77  --   --  0.98  --   --  1.05* 0.80  CALCIUM 9.5  --   --  8.9  --   --  8.6* 8.6*  MG  --   --   --   --   --  1.6* 2.1  --   PHOS  --   --   --   --   --  2.6  --   --    < > = values in this interval not displayed.   GFR: Estimated Creatinine Clearance: 43.8 mL/min (by C-G formula based on SCr of 0.8 mg/dL). Recent Labs  Lab 02/01/19 1433 02/01/19 1450 02/01/19 2346 02/02/19 0302 02/03/19 0312 02/04/19 0230  WBC  --  1.1* 2.5*  --  4.1 3.9*  LATICACIDVEN 1.4  --  1.9 1.1  --   --    Liver Function Tests: Recent Labs  Lab 02/01/19 1450 02/01/19 2346 02/03/19 0312 02/04/19 0230  AST 179* 180* 143* 111*  ALT 185* 171* 148* 129*  ALKPHOS 139* 123 117 106  BILITOT 0.6 0.6 0.6 0.9  PROT 5.8* 5.5* 5.2* 4.9*  ALBUMIN 2.9* 2.8* 2.2* 2.1*   No results for input(s): LIPASE, AMYLASE in the last 168 hours. Recent Labs  Lab 02/01/19 2348  AMMONIA 28   ABG    Component Value Date/Time   PHART 7.526 (H) 02/02/2019 0115   PCO2ART 32.2 02/02/2019 0115   PO2ART 373.0 (H) 02/02/2019 0115   HCO3 26.8 02/02/2019 0115   TCO2 28 02/02/2019 0115   O2SAT 100.0 02/02/2019 0115    Coagulation Profile: No results for input(s): INR, PROTIME in the last 168 hours.  Cardiac Enzymes: Recent Labs  Lab 02/01/19 1450 02/01/19 2346  CKTOTAL 132 111   HbA1C: Hgb A1c MFr Bld  Date/Time Value Ref Range Status  02/02/2019 03:02 AM 5.6 4.8 - 5.6 % Final    Comment:    (NOTE) Pre diabetes:          5.7%-6.4% Diabetes:              >6.4% Glycemic control for   <7.0% adults with diabetes   03/31/2017 11:44 AM 5.7 (H) <5.7 % of total Hgb Final    Comment:    For someone without known diabetes, a hemoglobin  A1c value  between 5.7% and 6.4% is consistent with prediabetes and should be  confirmed with a  follow-up test. . For someone with known diabetes, a value <7% indicates that their diabetes is well controlled. A1c targets should be individualized based on duration of diabetes, age, comorbid conditions, and other considerations. . This assay result is consistent with an increased risk of diabetes. . Currently, no consensus exists regarding use of hemoglobin A1c for diagnosis of diabetes for children. .     CBG: Recent Labs  Lab 02/03/19 1113 02/03/19 1519 02/03/19 2006 02/04/19 0012 02/04/19 0400  GLUCAP 107* 120* 132* 123* 115*    Critical care time:    Kathi Ludwig, MD [facility], PGY-3  Please see Attending A/P and/or Addendum for final recommendations.

## 2019-02-04 NOTE — Progress Notes (Signed)
PMT consult received and chart reviewed. Discussed with Dr. Tamala Julian who recommends I hold on consult today. He plans to speak with daughter this afternoon. Feels the patient needs more time. PMT provider will continue to follow chart and discuss with attending. Thank you.   NO CHARGE  Ihor Dow, [place], FNP-C Palliative Medicine Team  Phone: [phone] Fax: [phone]

## 2019-02-05 LAB — CBC WITH DIFFERENTIAL/PLATELET
Abs Immature Granulocytes: 0.02 10*3/uL (ref 0.00–0.07)
Basophils Absolute: 0 10*3/uL (ref 0.0–0.1)
Basophils Relative: 0 %
Eosinophils Absolute: 0 10*3/uL (ref 0.0–0.5)
Eosinophils Relative: 1 %
HCT: 27.9 % — ABNORMAL LOW (ref 36.0–46.0)
Hemoglobin: 9 g/dL — ABNORMAL LOW (ref 12.0–15.0)
Immature Granulocytes: 1 %
Lymphocytes Relative: 18 %
Lymphs Abs: 0.7 10*3/uL (ref 0.7–4.0)
MCH: 33 pg (ref 26.0–34.0)
MCHC: 32.3 g/dL (ref 30.0–36.0)
MCV: 102.2 fL — ABNORMAL HIGH (ref 80.0–100.0)
Monocytes Absolute: 0.6 10*3/uL (ref 0.1–1.0)
Monocytes Relative: 15 %
Neutro Abs: 2.5 10*3/uL (ref 1.7–7.7)
Neutrophils Relative %: 65 %
Platelets: 59 10*3/uL — ABNORMAL LOW (ref 150–400)
RBC: 2.73 MIL/uL — ABNORMAL LOW (ref 3.87–5.11)
RDW: 17.4 % — ABNORMAL HIGH (ref 11.5–15.5)
WBC: 3.9 10*3/uL — ABNORMAL LOW (ref 4.0–10.5)
nRBC: 1 % — ABNORMAL HIGH (ref 0.0–0.2)

## 2019-02-05 LAB — CBC
HCT: 25.1 % — ABNORMAL LOW (ref 36.0–46.0)
Hemoglobin: 8.2 g/dL — ABNORMAL LOW (ref 12.0–15.0)
MCH: 32.8 pg (ref 26.0–34.0)
MCHC: 32.7 g/dL (ref 30.0–36.0)
MCV: 100.4 fL — ABNORMAL HIGH (ref 80.0–100.0)
Platelets: 58 10*3/uL — ABNORMAL LOW (ref 150–400)
RBC: 2.5 MIL/uL — ABNORMAL LOW (ref 3.87–5.11)
RDW: 17.3 % — ABNORMAL HIGH (ref 11.5–15.5)
WBC: 3.6 10*3/uL — ABNORMAL LOW (ref 4.0–10.5)
nRBC: 1.1 % — ABNORMAL HIGH (ref 0.0–0.2)

## 2019-02-05 LAB — COMPREHENSIVE METABOLIC PANEL
ALT: 111 U/L — ABNORMAL HIGH (ref 0–44)
AST: 87 U/L — ABNORMAL HIGH (ref 15–41)
Albumin: 2 g/dL — ABNORMAL LOW (ref 3.5–5.0)
Alkaline Phosphatase: 96 U/L (ref 38–126)
Anion gap: 7 (ref 5–15)
BUN: 33 mg/dL — ABNORMAL HIGH (ref 8–23)
CO2: 22 mmol/L (ref 22–32)
Calcium: 8.6 mg/dL — ABNORMAL LOW (ref 8.9–10.3)
Chloride: 108 mmol/L (ref 98–111)
Creatinine, Ser: 0.83 mg/dL (ref 0.44–1.00)
GFR calc Af Amer: >60 mL/min (ref >60)
GFR calc non Af Amer: >60 mL/min (ref >60)
Glucose, Bld: 110 mg/dL — ABNORMAL HIGH (ref 70–99)
Potassium: 4 mmol/L (ref 3.5–5.1)
Sodium: 137 mmol/L (ref 135–145)
Total Bilirubin: 0.4 mg/dL (ref 0.3–1.2)
Total Protein: 4.8 g/dL — ABNORMAL LOW (ref 6.5–8.1)

## 2019-02-05 LAB — GLUCOSE, CAPILLARY
Glucose-Capillary: 98 mg/dL (ref 70–99)
Glucose-Capillary: 90 mg/dL (ref 70–99)
Glucose-Capillary: 110 mg/dL — ABNORMAL HIGH (ref 70–99)
Glucose-Capillary: 111 mg/dL — ABNORMAL HIGH (ref 70–99)
Glucose-Capillary: 106 mg/dL — ABNORMAL HIGH (ref 70–99)

## 2019-02-05 LAB — MAGNESIUM: Magnesium: 1.9 mg/dL (ref 1.7–2.4)

## 2019-02-05 LAB — TRIGLYCERIDES: Triglycerides: 24 mg/dL (ref <150)

## 2019-02-05 MED ORDER — PRO-STAT SUGAR FREE PO LIQD
30.0000 mL | Freq: Every day | ORAL | Status: DC
  Administered 2019-02-05 – 2019-02-06 (×2): 30 mL
  Filled 2019-02-05 (×2): qty 30

## 2019-02-05 MED ORDER — GLUCERNA 1.2 CAL PO LIQD
1000.0000 mL | ORAL | Status: DC
  Administered 2019-02-05: 1000 mL
  Filled 2019-02-05 (×2): qty 1000

## 2019-02-05 MED ORDER — OSMOLITE 1.2 CAL PO LIQD
1000.0000 mL | ORAL | Status: DC
  Filled 2019-02-05: qty 1000

## 2019-02-05 MED ORDER — CHLORHEXIDINE GLUCONATE 0.12 % MT SOLN
15.0000 mL | Freq: Two times a day (BID) | OROMUCOSAL | Status: DC
  Administered 2019-02-05 – 2019-02-13 (×15): 15 mL via OROMUCOSAL
  Filled 2019-02-05 (×15): qty 15

## 2019-02-05 MED ORDER — ORAL CARE MOUTH RINSE
15.0000 mL | Freq: Two times a day (BID) | OROMUCOSAL | Status: DC
  Administered 2019-02-05 – 2019-02-13 (×11): 15 mL via OROMUCOSAL

## 2019-02-05 MED ORDER — QUETIAPINE FUMARATE 50 MG PO TABS: 25.0000 mg | ORAL_TABLET | Freq: Every day | ORAL | Status: DC

## 2019-02-05 MED ORDER — SERTRALINE HCL 50 MG PO TABS
25.0000 mg | ORAL_TABLET | Freq: Every day | ORAL | Status: DC
  Administered 2019-02-05: 25 mg
  Filled 2019-02-05: qty 1

## 2019-02-05 NOTE — Progress Notes (Signed)
SLP Cancellation Note  Patient Details Name: Jocelyn Mccann MRN: [id number] DOB: 01-21-1928   Cancelled treatment:        Orders received for swallow. RN reports she is still lethargic this am and not quite ready for assessment. May be ready this afternoon. Will plan to see 1/26 or sooner if schedule allows.    Houston Siren 02/05/2019, 11:01 AM   Orbie Pyo Colvin Caroli.Ed Risk analyst [phone] Office [phone]

## 2019-02-05 NOTE — Progress Notes (Signed)
Chart reviewed. Spoke with Dr. Berline Lopes about plan of care including transfer out of ICU and to [facility] on 1/26. PCCM recommending palliative GOC discussion with daughter. Patient with underlying dementia. Daughter visited briefly but has already left this afternoon. Spoke with daughter, Jocelyn Mccann via telephone. [place] meeting scheduled for tomorrow, 1/26 at 10am with patient and daughter. Thank you.  NO CHARGE  Ihor Dow, [employer], FNP-C Palliative Medicine Team  Phone: [phone] Fax: [phone]

## 2019-02-05 NOTE — Plan of Care (Signed)
Spoke with Dr. Marylyn Ishihara of [facility] who agreed to pick up the patient the morning of 02/06/19 at 7 am. We appreciate their assistance with our patient.   Kathi Ludwig, MD [facility] Resident, PGY-3

## 2019-02-05 NOTE — Progress Notes (Signed)
Neurology Progress Note   S:// Seen and examined. Extubated yesterday  O:// Current vital signs: BP 125/81   Pulse 74   Temp (!) 97.2 F (36.2 C) (Oral)   Resp 20   Ht '5\' 2"'$  (1.575 m)   Wt 67.6 kg   SpO2 100%   BMI 27.26 kg/m  Vital signs in last 24 hours: Temp:  [97.2 F (36.2 C)-98.8 F (37.1 C)] 97.2 F (36.2 C) (01/25 0748) Pulse Rate:  [60-88] 74 (01/25 0730) Resp:  [11-20] 20 (01/25 0730) BP: (86-126)/(44-81) 125/81 (01/25 0730) SpO2:  [94 %-100 %] 100 % (01/25 0730) FiO2 (%):  [40 %] 40 % (01/24 1114) General: Sleepy, wakes to voice alert to self. HEENT: Normocephalic atraumatic, edentulous with asymmetric appearing face but difficult to say whether it is asymmetric on the right on the left. CVS: Regular rate rhythm Respiratory: Extubated yesterday, breathing comfortably Extremities: Both upper extremities appear swollen Neurological exam Sleeping, awakens to voice, follows simple commands. Able to say her name-hypophonic. Unable to tell me the day, date or time.  Unable to tell me where she is at. Cranial nerves: Pupils are equal round reactive to light, extraocular movements appear intact, facial symmetry difficult to ascertain due to edentulousness, blinks to threat from both sides. Motor exam: There is no movement on the upper extremities, but she wiggles toes to command in both lower extremities. Sensory: Withdrawal versus triple flexion of the lower extremities to noxious stimulation.  No movement in the upper extremities.  Medications  Current Facility-Administered Medications:  .  acetaminophen (TYLENOL) tablet 650 mg, 650 mg, Oral, Q6H PRN **OR** acetaminophen (TYLENOL) suppository 650 mg, 650 mg, Rectal, Q6H PRN, Schertz, Michele Mcalpine, MD .  bisacodyl (DULCOLAX) suppository 10 mg, 10 mg, Rectal, Daily PRN, Schertz, Michele Mcalpine, MD .  cefTRIAXone (ROCEPHIN) 1 g in sodium chloride 0.9 % 100 mL IVPB, 1 g, Intravenous, Q24H, Candee Furbish, MD, Stopped at  02/04/19 1126 .  chlorhexidine gluconate (MEDLINE KIT) (PERIDEX) 0.12 % solution 15 mL, 15 mL, Mouth Rinse, BID, Jonnie Finner, Michele Mcalpine, MD, 15 mL at 02/05/19 0750 .  Chlorhexidine Gluconate Cloth 2 % PADS 6 each, 6 each, Topical, Q0600, Bennie Pierini, MD, 6 each at 02/04/19 [phone] .  dextrose 5 %-0.45 % sodium chloride infusion, , Intravenous, Continuous, Harbrecht, Lawrence, MD, Last Rate: 75 mL/hr at 02/05/19 0700, Rate Verify at 02/05/19 0700 .  docusate (COLACE) 50 MG/5ML liquid 100 mg, 100 mg, Per Tube, BID PRN, Bennie Pierini, MD .  famotidine (PEPCID) 40 MG/5ML suspension 20 mg, 20 mg, Per Tube, BID, Jonnie Finner Michele Mcalpine, MD, 20 mg at 02/04/19 2234 .  feeding supplement (PRO-STAT SUGAR FREE 64) liquid 30 mL, 30 mL, Per Tube, Daily, Spero Geralds, MD, 30 mL at 02/04/19 0938 .  feeding supplement (VITAL HIGH PROTEIN) liquid 1,000 mL, 1,000 mL, Per Tube, Q24H, Spero Geralds, MD, 1,000 mL at 02/03/19 1511 .  fentaNYL (SUBLIMAZE) injection 25 mcg, 25 mcg, Intravenous, Q15 min PRN, Schertz, Michele Mcalpine, MD .  fentaNYL (SUBLIMAZE) injection 25-100 mcg, 25-100 mcg, Intravenous, Q30 min PRN, Bennie Pierini, MD, 100 mcg at 02/03/19 1620 .  insulin aspart (novoLOG) injection 0-9 Units, 0-9 Units, Subcutaneous, Q4H, Schertz, Michele Mcalpine, MD, 1 Units at 02/04/19 0024 .  levETIRAcetam (KEPPRA) IVPB 500 mg/100 mL premix, 500 mg, Intravenous, Q12H, Schertz, Michele Mcalpine, MD, Stopped at 02/05/19 0028 .  LORazepam (ATIVAN) injection 2 mg, 2 mg, Intravenous, Q1H PRN, Jonnie Finner Michele Mcalpine, MD .  MEDLINE mouth rinse, 15 mL, Mouth Rinse, 10 times per day, Bennie Pierini, MD, 15 mL at 02/04/19 1703 .  ondansetron (ZOFRAN) tablet 4 mg, 4 mg, Oral, Q6H PRN **OR** ondansetron (ZOFRAN) injection 4 mg, 4 mg, Intravenous, Q6H PRN, Jonnie Finner, Michele Mcalpine, MD .  QUEtiapine (SEROQUEL) tablet 25 mg, 25 mg, Per Tube, QHS, Sood, Vineet, MD .  sertraline (ZOLOFT) tablet 25 mg, 25 mg, Per Tube, Daily, Sood, Vineet, MD .  white  petrolatum (VASELINE) gel, , , ,  Labs CBC    Component Value Date/Time   WBC 3.6 (L) 02/05/2019 0217   RBC 2.50 (L) 02/05/2019 0217   HGB 8.2 (L) 02/05/2019 0217   HCT 25.1 (L) 02/05/2019 0217   HCT 28.9 (L) 02/02/2019 0302   PLT 58 (L) 02/05/2019 0217   MCV 100.4 (H) 02/05/2019 0217   MCH 32.8 02/05/2019 0217   MCHC 32.7 02/05/2019 0217   RDW 17.3 (H) 02/05/2019 0217   LYMPHSABS 0.4 (L) 02/04/2019 0230   MONOABS 0.5 02/04/2019 0230   EOSABS 0.0 02/04/2019 0230   BASOSABS 0.0 02/04/2019 0230    CMP     Component Value Date/Time   NA 137 02/05/2019 0217   K 4.0 02/05/2019 0217   CL 108 02/05/2019 0217   CO2 22 02/05/2019 0217   GLUCOSE 110 (H) 02/05/2019 0217   BUN 33 (H) 02/05/2019 0217   CREATININE 0.83 02/05/2019 0217   CREATININE 0.85 03/31/2017 1144   CALCIUM 8.6 (L) 02/05/2019 0217   PROT 4.8 (L) 02/05/2019 0217   ALBUMIN 2.0 (L) 02/05/2019 0217   AST 87 (H) 02/05/2019 0217   ALT 111 (H) 02/05/2019 0217   ALKPHOS 96 02/05/2019 0217   BILITOT 0.4 02/05/2019 0217   GFRNONAA >60 02/05/2019 0217   GFRNONAA 62 03/31/2017 1144   GFRAA >60 02/05/2019 0217   GFRAA 71 03/31/2017 1144   Imaging I have reviewed images in epic and the results pertinent to this consultation are: MRI brain with no large strokes.  A couple of punctate areas of restricted diffusion possible in the right thalamus/thalamic capsular junction and possibly right cerebellum. MRI C-spine with no cord compression.  Multilevel degenerative disc disease.  Assessment: 86 year old brought in for altered mental status of unclear etiology, likely related to sepsis.  EEG did not show seizures.  Earlier during the history, has had progressive decline over the past week prior to presentation and on presentation had neutropenia, hypoglycemia, hypothermia and hypotension. Question of seizure.  But unlikely that seizure alone could have this complete presentation. On examination, continues to be weak on upper  extremities with barely any movement of the upper extremities.  Is able to move both her lower extremities. C-spine MRI has been unremarkable to explain the upper extremity weakness. Both arms are swollen-DVT should be ruled out.  Impression: Multifactorial toxic metabolic encephalopathy Bilateral upper extremity weakness.  Recommendations: -Continue Keppra 500 twice daily -I will continue to follow her exam-I am not sure entirely as to what is causing her bilateral upper extremity weakness given the fact that the brain and C-spine MRIs cannot explain this. -A peripheral process such as compressive neuropathies is unlikely to be bilaterally symmetric -Consider upper extremity venous Dopplers -Continue PT OT  -- Amie Portland, MD [facility] Pager: [phone] If 7pm to 7am, please call on call as listed on AMION.

## 2019-02-05 NOTE — Progress Notes (Signed)
Initial Nutrition Assessment  DOCUMENTATION CODES:   Not applicable  INTERVENTION:   Change TF to meet re-estimated needs:   Glucerna 1.2 at 55 ml/h (1320 ml per day)   Pro-stat 30 ml once daily   Provides 1684 kcal, 94 gm protein, 1063 ml free water daily  NUTRITION DIAGNOSIS:   Inadequate oral intake related to inability to eat as evidenced by NPO status.  Ongoing   GOAL:   Patient will meet greater than or equal to 90% of their needs   Met with TF  MONITOR:   Vent status, TF tolerance, Labs, I & O's  ASSESSMENT:   86 yo female admitted with AMS and hypothermia S/P fall; had hypoglycemia (glucose 23) and a seizure and required intubation. PMH includes breast cancer, Alzheimer's dementia, seizure D/O, recent falls.   NG tube in place. TF has been on hold since extubation on 1/24. RN plans to resume TF today.  TF order: Vital High Protein at 40 ml/h with Prostat 30 ml once daily.  She is currently not alert enough for swallow evaluation. SLP to evaluate swallow function tomorrow.   Labs reviewed. CBG's: 98-90-110  IVF: D5 1/2 NS at 75 ml/h.  Medications reviewed.   Last weight 67.6 kg 1/23, up from 61 kg on admission.  NUTRITION - FOCUSED PHYSICAL EXAM:  unable to complete  Diet Order:   Diet Order            Diet NPO time specified  Diet effective now              EDUCATION NEEDS:   No education needs have been identified at this time  Skin:  Skin Assessment: (L eye laceration)  Last BM:  No BM documented  Height:   Ht Readings from Last 1 Encounters:  02/02/19 '5\' 2"'$  (1.575 m)    Weight:   Wt Readings from Last 1 Encounters:  02/03/19 67.6 kg    Ideal Body Weight:  50 kg  BMI:  Body mass index is 27.26 kg/m.  Estimated Nutritional Needs:   Kcal:  1500-1700  Protein:  80-95 gm  Fluid:  >/= 1.5 L    Molli Barrows, RD, LDN, [place] Pager [phone] After Hours Pager [phone]

## 2019-02-05 NOTE — Progress Notes (Signed)
NAME:  Jocelyn Mccann, MRN:  [id number], DOB:  24-Dec-1928, LOS: 4 ADMISSION DATE:  02/01/2019, CONSULTATION DATE:  02/05/19  REFERRING MD:  Dr. Hayes Ludwig, CHIEF COMPLAINT:  Intubation for airway protection   Brief History   Jocelyn Mccann is a 86 y.o. female with h/o right breast ca on anastrozole, Alzheimer's dementia, seizure disorder and recent history of falls who presented with an unwitnessed fall and worsening confusion. She was initially triaged to [facility] but became obtunded during her ED stay after a witnessed seizure episode, and subsequently required intubation.   History of present illness   Jocelyn Mccann is a 86 y.o. female with h/o right breast ca on anastrozole, Alzheimer's dementia, seizure disorder and recent history of falls who presented to the ED on 1/21 after an unwitnessed fall and worsening confusion over the last week. This is her third ED visit in the last 7 days for a fall. She was previously seen on 1/20 for a fall and required sutures above her left eye. On presentation to the ED, she was hypothermic and bradycardic with recorded temp of 44F on arrival. Bair hugger was placed and cultures were obtained. She was then started on broad spectrum empiric antibiotics. Labs revealed pancytopenia with WBC 1.1 (400 neutrophils), Hgb 10.4 and plt 102. Mild elevation in LFTs also seen with AST 179 and ALT 185 on arrival. She has no known history of liver disease. Urinalysis was largely unremarkable. CXR showed no evidence for pneumonia.  While in the ED, she became more hypotensive and obtunded. She was found to have a glucose of 23 and had apparent seizure activity with rhythmic eye movements. She was given 2 mg Ativan and Neurology was consulted. She was then loaded with 1 g Keppra and the decision was made to proceed with intubation after discussion with the family. Etomidate and succinylcholine given for RSI and patient was successfully intubated by ED attending prior to  transfer.  Past Medical History   Past Medical History:  Diagnosis Date  . Bilateral knee pain   . Breast cancer ([place])    diagnosed 11/2017  . Dementia ([place])   . Osteoporosis   . Urge incontinence of urine    Significant Hospital Events   1/22: admit to ICU for sepsis; intubated and started on broad spectrum antibiotics 1/23: no acute overnight events, urine cx with klebsiella; MRSA PCR negative; discontinuing vancomycin; continues on vent support  Consults:  Neurology PCCM  Procedures:  Intubation 1/22  Significant Diagnostic Tests:  CT HEAD WITHOUT CONTRAST & CT CERVICAL SPINE WITHOUT CONTRAST 02/01/2019 IMPRESSION: Head CT: No acute or traumatic finding. Advanced atrophy, temporal lobe predominant. Chronic small-vessel changes. Cervical spine CT: No acute or traumatic finding. Chronic degenerative cervical spondylosis and facet osteoarthritis.  PORTABLE PELVIS 1-2 VIEWS 02/01/2019 IMPRESSION: Diffuse osteopenia. Degenerative changes lumbar spine and both hips. No acute abnormality identified.  CT HEAD WO CONTRAST 02/02/2019:  IMPRESSION: Right frontal scalp hematoma. No acute intracranial abnormality. Stable atrophy and chronic small vessel ischemia.  RUQ [place] 02/02/2019: IMPRESSION: 1. Normal sonographic appearance of the gallbladder. 2. Question of mild biliary dilatation for age, common bile duct measures 9 mm. Please note the common bile duct is not well-defined sonographically. 3. Simple cyst in the right lobe of the liver. Borderline steatosis.  EEG 1//22/2021: Severe diffuse encephalopathy; no seizures or epileptiform discharges seen throughout recording.   ECHO 02/02/2019:  LVEF 60-65%, no wall LV motion abnormalities; grade I diastolic dysfunction; moderate-severe tricuspid regurgitation  Micro Data:  1/21: SARS CoV-2/Influenza PCR negative 1/21: Blood cx> no growth x2days 1/21: Urine cx> Klebsiella pneumoniae  Trach aspirate pending 1/22: MRSA PCR  negative  Antimicrobials:  Vancomycin 1/21 >> 1/23 Cefepime 1/21 >>  Objective   Blood pressure (!) 126/58, pulse 67, temperature (!) 97.5 F (36.4 C), temperature source Oral, resp. rate 12, height 5\' 2"  (1.575 m), weight 67.6 kg, SpO2 100 %.    Vent Mode: CPAP;PSV FiO2 (%):  [40 %] 40 % PEEP:  [5 cmH20] 5 cmH20 Pressure Support:  [10 cmH20] 10 cmH20   Intake/Output Summary (Last 24 hours) at 02/05/2019 0658 Last data filed at 02/05/2019 0610 Gross per 24 hour  Intake 2698.44 ml  Output 625 ml  Net 2073.44 ml   Filed Weights   02/01/19 1340 02/02/19 0240 02/03/19 0434  Weight: 61 kg 63.8 kg 67.6 kg   Examination: General: In no acute distress, afebrile, nondiaphoretic HEENT: PEERL, EMO intact Cardio: RRR, no mrg's  Pulmonary: CTA bilaterally, no wheezing or crackles  Abdomen: Bowel sounds normal, soft, nontender  MSK: BLE nontender, nonedematous Neuro: Alert, opens eyes, moves extremities, does not speak Psych: Appropriate affect, not depressed in appearance, engages well Skin: Warm to touch  I/Os:   Assessment & Plan:  Septic shock, likely secondary to UTI Pancytopenia Hypothermia Patient with distributive shock requiring pressors. Lactate 1.1. Sepsis most likely given her pancytopenia and hypothermia. Anemia workup notable for B12 of 81. Urine culture with klebsiella spp. Blood cultures negative to date. Patient continues to require pressor support. Plan -Continue peripheral Levophed for now for MAP goal >65 -Volume resuscitate as needed -Continue cefepime  -Discontinued Vanc -If mentation not improving will need to have [place] discussion with daughter  CVA Encephalopathy Metabolic vs septic although the latter seems less likely given her lack of clear causative infection. This has also been adequately treated for some time without resolution.  On brain MRI with microhemorraghes. Read as acute or recent subacute. Neurology following. Appreciate their  recommendations.  Pancytopenia: Etiology uncertain. Will need outpatient hematology follow-up. Counts stable today.   Seizure activity Hypoglycemia Likely hypoglycemia induced seizure. CT head negative for acute intracranial abnormalities. EEG with no ongoing seizure activity.  Plan:  - Neurology following, appreciate their recommendations - Continue keppra 500mg  bid - PRN Ativan for seizure activity - Continue D5-1/2NS   Elevated LFTs Improving.  Hepatocellular injury pattern that is likely related to underlying infectious illness vs hypotension. Hepatitis panel negative. RUQ [place] without any acute findings. Patient's transaminitis likely secondary to shock liver in setting of shock.  - Continue to trend LFTs  Best practice:  Diet: NPO, NGT in position, will start tube fees if she remains unable to wean from vent Pain/Anxiety/Delirium protocol (if indicated): Propofol gtt, fentanyl PRN VAP protocol (if indicated): Chlorhexidine DVT prophylaxis: Lovenox GI prophylaxis: H2 blocker Glucose control: SSI Mobility: N/A Code Status: Full Family Communication: will update daughter, [name] Disposition: ICU  Labs   CBC: Recent Labs  Lab 02/01/19 1450 02/01/19 2329 02/01/19 2346 02/01/19 2346 02/02/19 0115 02/02/19 0302 02/03/19 0312 02/04/19 0230 02/05/19 0217  WBC 1.1*  --  2.5*  --   --   --  4.1 3.9* 3.6*  NEUTROABS 0.4*  --   --   --   --   --  3.2 3.1  --   HGB 10.4*   < > 10.0*  --  10.2*  --  9.3* 8.7* 8.2*  HCT 31.6*   < > 29.7*   < > 30.0*  28.9* 28.3* 26.0* 25.1*  MCV 99.7  --  98.7  --   --   --  99.0 98.9 100.4*  PLT 102*  --  108*  --   --   --  81* 59* 58*   < > = values in this interval not displayed.   Basic Metabolic Panel: Recent Labs  Lab 02/01/19 1450 02/01/19 2329 02/01/19 2346 02/02/19 0115 02/02/19 0302 02/03/19 0312 02/04/19 0230 02/05/19 0217  NA 139   < > 141 142  --  142 140 137  K 4.4   < > 4.4 4.7  --  4.3 3.8 4.0  CL 104   --  111  --   --  112* 112* 108  CO2 27  --  23  --   --  24 22 22   GLUCOSE 173*  --  107*  --   --  119* 103* 110*  BUN 29*  --  31*  --   --  29* 35* 33*  CREATININE 0.77  --  0.98  --   --  1.05* 0.80 0.83  CALCIUM 9.5  --  8.9  --   --  8.6* 8.6* 8.6*  MG  --   --   --   --  1.6* 2.1  --  1.9  PHOS  --   --   --   --  2.6  --   --   --    < > = values in this interval not displayed.   GFR: Estimated Creatinine Clearance: 42.2 mL/min (by C-G formula based on SCr of 0.83 mg/dL). Recent Labs  Lab 02/01/19 1433 02/01/19 1450 02/01/19 2346 02/02/19 0302 02/03/19 0312 02/04/19 0230 02/05/19 0217  WBC  --    < > 2.5*  --  4.1 3.9* 3.6*  LATICACIDVEN 1.4  --  1.9 1.1  --   --   --    < > = values in this interval not displayed.   Liver Function Tests: Recent Labs  Lab 02/01/19 1450 02/01/19 2346 02/03/19 0312 02/04/19 0230 02/05/19 0217  AST 179* 180* 143* 111* 87*  ALT 185* 171* 148* 129* 111*  ALKPHOS 139* 123 117 106 96  BILITOT 0.6 0.6 0.6 0.9 0.4  PROT 5.8* 5.5* 5.2* 4.9* 4.8*  ALBUMIN 2.9* 2.8* 2.2* 2.1* 2.0*   No results for input(s): LIPASE, AMYLASE in the last 168 hours. Recent Labs  Lab 02/01/19 2348  AMMONIA 28   ABG    Component Value Date/Time   PHART 7.526 (H) 02/02/2019 0115   PCO2ART 32.2 02/02/2019 0115   PO2ART 373.0 (H) 02/02/2019 0115   HCO3 26.8 02/02/2019 0115   TCO2 28 02/02/2019 0115   O2SAT 100.0 02/02/2019 0115    Coagulation Profile: No results for input(s): INR, PROTIME in the last 168 hours.  Cardiac Enzymes: Recent Labs  Lab 02/01/19 1450 02/01/19 2346  CKTOTAL 132 111   HbA1C: Hgb A1c MFr Bld  Date/Time Value Ref Range Status  02/02/2019 03:02 AM 5.6 4.8 - 5.6 % Final    Comment:    (NOTE) Pre diabetes:          5.7%-6.4% Diabetes:              >6.4% Glycemic control for   <7.0% adults with diabetes   03/31/2017 11:44 AM 5.7 (H) <5.7 % of total Hgb Final    Comment:    For someone without known diabetes, a  hemoglobin  A1c value between 5.7%  and 6.4% is consistent with prediabetes and should be confirmed with a  follow-up test. . For someone with known diabetes, a value <7% indicates that their diabetes is well controlled. A1c targets should be individualized based on duration of diabetes, age, comorbid conditions, and other considerations. . This assay result is consistent with an increased risk of diabetes. . Currently, no consensus exists regarding use of hemoglobin A1c for diagnosis of diabetes for children. .     CBG: Recent Labs  Lab 02/04/19 1120 02/04/19 1506 02/04/19 1938 02/04/19 2341 02/05/19 0415  GLUCAP 92 104* 93 96 98    Critical care time:    Kathi Ludwig, MD [facility], PGY-3  Please see Attending A/P and/or Addendum for final recommendations.

## 2019-02-06 ENCOUNTER — Encounter (HOSPITAL_COMMUNITY): Payer: Self-pay | Admitting: Internal Medicine

## 2019-02-06 DIAGNOSIS — E162 Hypoglycemia, unspecified: Secondary | ICD-10-CM

## 2019-02-06 DIAGNOSIS — G301 Alzheimer's disease with late onset: Secondary | ICD-10-CM

## 2019-02-06 DIAGNOSIS — N390 Urinary tract infection, site not specified: Secondary | ICD-10-CM

## 2019-02-06 DIAGNOSIS — R1310 Dysphagia, unspecified: Secondary | ICD-10-CM | POA: Insufficient documentation

## 2019-02-06 DIAGNOSIS — Z7189 Other specified counseling: Secondary | ICD-10-CM

## 2019-02-06 DIAGNOSIS — G309 Alzheimer's disease, unspecified: Secondary | ICD-10-CM

## 2019-02-06 DIAGNOSIS — F0280 Dementia in other diseases classified elsewhere without behavioral disturbance: Secondary | ICD-10-CM

## 2019-02-06 DIAGNOSIS — D61818 Other pancytopenia: Secondary | ICD-10-CM

## 2019-02-06 DIAGNOSIS — A419 Sepsis, unspecified organism: Secondary | ICD-10-CM

## 2019-02-06 DIAGNOSIS — G9340 Encephalopathy, unspecified: Secondary | ICD-10-CM | POA: Diagnosis present

## 2019-02-06 DIAGNOSIS — R569 Unspecified convulsions: Secondary | ICD-10-CM

## 2019-02-06 DIAGNOSIS — F0281 Dementia in other diseases classified elsewhere with behavioral disturbance: Secondary | ICD-10-CM

## 2019-02-06 DIAGNOSIS — Z515 Encounter for palliative care: Secondary | ICD-10-CM

## 2019-02-06 DIAGNOSIS — S065X9A Traumatic subdural hemorrhage with loss of consciousness of unspecified duration, initial encounter: Secondary | ICD-10-CM

## 2019-02-06 HISTORY — DX: Unspecified convulsions: R56.9

## 2019-02-06 LAB — CULTURE, BLOOD (ROUTINE X 2)
Culture: NO GROWTH
Culture: NO GROWTH
Special Requests: ADEQUATE
Special Requests: ADEQUATE

## 2019-02-06 LAB — COMPREHENSIVE METABOLIC PANEL
ALT: 184 U/L — ABNORMAL HIGH (ref 0–44)
AST: 191 U/L — ABNORMAL HIGH (ref 15–41)
Albumin: 2.3 g/dL — ABNORMAL LOW (ref 3.5–5.0)
Alkaline Phosphatase: 121 U/L (ref 38–126)
Anion gap: 9 (ref 5–15)
BUN: 31 mg/dL — ABNORMAL HIGH (ref 8–23)
CO2: 21 mmol/L — ABNORMAL LOW (ref 22–32)
Calcium: 9.3 mg/dL (ref 8.9–10.3)
Chloride: 111 mmol/L (ref 98–111)
Creatinine, Ser: 0.82 mg/dL (ref 0.44–1.00)
GFR calc Af Amer: >60 mL/min (ref >60)
GFR calc non Af Amer: >60 mL/min (ref >60)
Glucose, Bld: 115 mg/dL — ABNORMAL HIGH (ref 70–99)
Potassium: 4.3 mmol/L (ref 3.5–5.1)
Sodium: 141 mmol/L (ref 135–145)
Total Bilirubin: 0.8 mg/dL (ref 0.3–1.2)
Total Protein: 5.7 g/dL — ABNORMAL LOW (ref 6.5–8.1)

## 2019-02-06 LAB — GLUCOSE, CAPILLARY
Glucose-Capillary: 103 mg/dL — ABNORMAL HIGH (ref 70–99)
Glucose-Capillary: 105 mg/dL — ABNORMAL HIGH (ref 70–99)
Glucose-Capillary: 85 mg/dL (ref 70–99)
Glucose-Capillary: 89 mg/dL (ref 70–99)
Glucose-Capillary: 95 mg/dL (ref 70–99)
Glucose-Capillary: 97 mg/dL (ref 70–99)

## 2019-02-06 LAB — PATHOLOGIST SMEAR REVIEW

## 2019-02-06 MED ORDER — SERTRALINE HCL 50 MG PO TABS
25.0000 mg | ORAL_TABLET | Freq: Every day | ORAL | Status: DC
  Administered 2019-02-06 – 2019-02-13 (×7): 25 mg via ORAL
  Filled 2019-02-06 (×8): qty 1

## 2019-02-06 MED ORDER — QUETIAPINE FUMARATE 25 MG PO TABS
25.0000 mg | ORAL_TABLET | Freq: Every day | ORAL | Status: DC
  Administered 2019-02-06 – 2019-02-12 (×6): 25 mg via ORAL
  Filled 2019-02-06 (×6): qty 1

## 2019-02-06 MED ORDER — DONEPEZIL HCL 5 MG PO TABS
5.0000 mg | ORAL_TABLET | Freq: Every day | ORAL | Status: DC
  Administered 2019-02-06 – 2019-02-12 (×6): 5 mg via ORAL
  Filled 2019-02-06 (×6): qty 1

## 2019-02-06 MED ORDER — LEVETIRACETAM 100 MG/ML PO SOLN
500.0000 mg | Freq: Two times a day (BID) | ORAL | Status: DC
  Administered 2019-02-06 – 2019-02-08 (×6): 500 mg via ORAL
  Filled 2019-02-06 (×9): qty 5

## 2019-02-06 NOTE — Evaluation (Signed)
Occupational Therapy Evaluation Patient Details Name: Jocelyn Mccann MRN: [id number] DOB: Dec 07, 1928 Today's Date: 02/06/2019    History of Present Illness Jocelyn Mccann is a 86 y.o. female with PMH of breast cancer on treatment with anastrozole, dementia, recurrent syncope, presenting from home due to fall and AMS. In ED Pt was found neutropenic, hypoglycemic, hypothermic and hypotensive. Brain and C-spine MRI were unremarkable for an acute process, likely Pt with Multifocal toxic metabolic encephalopathy.   Clinical Impression   Unsure of PLOF as pt unreliable historian (AMS, speaking combination of spanish/english - Pt originally from [place]) Pleasantly confused throughout session. Reports pain in her R knee with movement. Max A +2 to come EOB and mod-min A to maintain seated position EOB, (did not attempt transfer due to safety this session) mod A to engage in grooming tasks, max to total for LB ADL and toileting at this time. Pt presents with poor cognition, balance, strength, and will require post-acute rehab at the SNF level, OT will follow acutely to maximize safety and independence in ADL and functional transfers. At end of session, mitts back in place. Next session try functional task with more familiar grooming ADL items.     Follow Up Recommendations  SNF;Supervision/Assistance - 24 hour    Equipment Recommendations  Other (comment)(defer to next venue)    Recommendations for Other Services       Precautions / Restrictions Precautions Precautions: Fall;Other (comment) Precaution Comments: restless/pulling at lines; somewhat of a language barrier; painful R knee Restrictions Weight Bearing Restrictions: No      Mobility Bed Mobility Overal bed mobility: Needs Assistance Bed Mobility: Supine to Sit;Sit to Supine     Supine to sit: Max assist;+2 for physical assistance;HOB elevated Sit to supine: Max assist;+2 for physical assistance   General bed mobility comments:  MaxAx2 to get to EOB, once up required Min-ModA to maintain upright, tends to have posterior LOB with dynamic tasks at EOB  Transfers                 General transfer comment: deferred- safety    Balance Overall balance assessment: Needs assistance Sitting-balance support: Feet supported;Bilateral upper extremity supported Sitting balance-Leahy Scale: Poor Sitting balance - Comments: Min-ModA for midline Postural control: Posterior lean                                 ADL either performed or assessed with clinical judgement   ADL Overall ADL's : Needs assistance/impaired Eating/Feeding: NPO   Grooming: Moderate assistance;Bed level   Upper Body Bathing: Maximal assistance   Lower Body Bathing: Total assistance   Upper Body Dressing : Maximal assistance   Lower Body Dressing: Total assistance     Toilet Transfer Details (indicate cue type and reason): deferred Toileting- Clothing Manipulation and Hygiene: Maximal assistance         General ADL Comments: limited by cognition, generalized weakness     Vision         Perception     Praxis      Pertinent Vitals/Pain Pain Assessment: Faces Faces Pain Scale: Hurts even more Pain Location: R knee with movements Pain Descriptors / Indicators: Aching;Sore;Sharp Pain Intervention(s): Limited activity within patient's tolerance;Monitored during session;Repositioned     Hand Dominance     Extremity/Trunk Assessment Upper Extremity Assessment Upper Extremity Assessment: Generalized weakness   Lower Extremity Assessment Lower Extremity Assessment: Defer to PT evaluation   Cervical /  Trunk Assessment Cervical / Trunk Assessment: Kyphotic   Communication Communication Communication: Other (comment)(Pt from [place], language barrier vs AMS)   Cognition Arousal/Alertness: Awake/alert Behavior During Therapy: Restless Overall Cognitive Status: No family/caregiver present to determine baseline  cognitive functioning                                 General Comments: restless and hx of pulling at lines, difficult to assess cognition and no family present to compare to baseline. Definitely seemed pleasantly confused during session today.   General Comments  speaking some English, does seem to understand simple cues intermittently, inconsistent cue following    Exercises     Shoulder Instructions      Home Living Family/patient expects to be discharged to:: Unsure                                 Additional Comments: unsure- patient not able to provide history. Per chart review, 5 months ago she was able to ambulate with cane and needed set up assist from family for dressing      Prior Functioning/Environment          Comments: unsure of PLOF due to pt  being poor historian        OT Problem List: Decreased strength;Decreased range of motion;Decreased activity tolerance;Impaired balance (sitting and/or standing);Decreased cognition;Decreased safety awareness;Pain;Increased edema      OT Treatment/Interventions: Self-care/ADL training;Therapeutic exercise;Therapeutic activities;Cognitive remediation/compensation;Patient/family education;Balance training    OT Goals(Current goals can be found in the care plan section) Acute Rehab OT Goals Patient Stated Goal: none stated Time For Goal Achievement: 02/20/19 Potential to Achieve Goals: Fair ADL Goals Pt Will Perform Grooming: with set-up;sitting Pt Will Perform Upper Body Bathing: with set-up;sitting Pt Will Transfer to Toilet: with mod assist;with +2 assist;bedside commode Pt Will Perform Toileting - Clothing Manipulation and hygiene: with mod assist;sitting/lateral leans Additional ADL Goal #1: Pt will perform bed mobility with mod A prior to engaging in ADL  OT Frequency: Min 2X/week   Barriers to D/C:            Co-evaluation PT/OT/SLP Co-Evaluation/Treatment: Yes Reason for  Co-Treatment: Complexity of the patient's impairments (multi-system involvement);For patient/therapist safety;To address functional/ADL transfers PT goals addressed during session: Mobility/safety with mobility;Balance OT goals addressed during session: ADL's and self-care;Strengthening/ROM      AM-PAC OT "6 Clicks" Daily Activity     Outcome Measure Help from another person eating meals?: Total(NPO) Help from another person taking care of personal grooming?: A Lot Help from another person toileting, which includes using toliet, bedpan, or urinal?: Total Help from another person bathing (including washing, rinsing, drying)?: A Lot Help from another person to put on and taking off regular upper body clothing?: A Lot Help from another person to put on and taking off regular lower body clothing?: Total 6 Click Score: 9   End of Session Nurse Communication: Mobility status  Activity Tolerance: Patient tolerated treatment well Patient left: in bed;with call bell/phone within reach;with bed alarm set;with restraints reapplied(mitts)  OT Visit Diagnosis: Unsteadiness on feet (R26.81);Other abnormalities of gait and mobility (R26.89);Repeated falls (R29.6);History of falling (Z91.81);Muscle weakness (generalized) (M62.81);Other symptoms and signs involving cognitive function;Pain Pain - Right/Left: Right Pain - part of body: Knee                Time: [id number] OT Time Calculation (  min): 25 min Charges:  OT General Charges $OT Visit: 1 Visit OT Evaluation $OT Eval Moderate Complexity: [place] OTR/L Acute Rehabilitation Services Pager: [phone] Office: [place] 02/06/2019, 1:29 PM

## 2019-02-06 NOTE — Evaluation (Signed)
Physical Therapy Evaluation Patient Details Name: Jocelyn Mccann MRN: [id number] DOB: Oct 13, 1928 Today's Date: 02/06/2019   History of Present Illness  KEI LANGHORST is a 86 y.o. female with PMH of breast cancer on treatment with anastrozole, dementia, recurrent syncope, presenting from home due to fall and AMS. In ED Pt was found neutropenic, hypoglycemic, hypothermic and hypotensive. Brain and C-spine MRI were unremarkable for an acute process, likely Pt with Multifocal toxic metabolic encephalopathy.  Clinical Impression    Patient received in bed, pleasant with staff and speaking/understanding some English, does seem pleasantly confused and restless, pulling at wires and lines today. See below for levels of assist with mobility. Required Min-ModA to maintain upright at EOB especially with dynamic tasks due to posterior lean. Did seem to be able to follow simple cues with extended time/increased effort but perhaps with a bit of inconsistency. Poor awareness of safety and deficits, grossly weak with B quads approximately 3-/5. R knee and calf more swollen than L, R knee also painful with certain movements. She was left in bed with all needs met, bed alarm active and restraints reapplied this morning.     Follow Up Recommendations Supervision/Assistance - 24 hour;SNF    Equipment Recommendations  Other (comment)(defer)    Recommendations for Other Services       Precautions / Restrictions Precautions Precautions: Fall;Other (comment) Precaution Comments: restless/pulling at lines; somewhat of a language barrier; painful R knee Restrictions Weight Bearing Restrictions: No      Mobility  Bed Mobility Overal bed mobility: Needs Assistance Bed Mobility: Supine to Sit;Sit to Supine     Supine to sit: Max assist;+2 for physical assistance;HOB elevated Sit to supine: Max assist;+2 for physical assistance   General bed mobility comments: MaxAx2 to get to EOB, once up required  Min-ModA to maintain upright, tends to have posterior LOB with dynamic tasks at EOB  Transfers                 General transfer comment: deferred- safety  Ambulation/Gait             General Gait Details: deferred- safety  Stairs            Wheelchair Mobility    Modified Rankin (Stroke Patients Only)       Balance Overall balance assessment: Needs assistance Sitting-balance support: Feet supported;Bilateral upper extremity supported Sitting balance-Leahy Scale: Poor Sitting balance - Comments: Min-ModA for midline Postural control: Posterior lean                                   Pertinent Vitals/Pain Pain Assessment: Faces Faces Pain Scale: Hurts even more Pain Location: R knee with movements Pain Descriptors / Indicators: Aching;Sore;Sharp Pain Intervention(s): Limited activity within patient's tolerance;Monitored during session;Repositioned    Home Living                   Additional Comments: unsure- patient not able to provide history. Per chart review, 5 months ago she was able to ambulate with cane and needed set up assist from family for dressing    Prior Function           Comments: unsure of PLOF due to pt  being poor historian     Hand Dominance        Extremity/Trunk Assessment   Upper Extremity Assessment Upper Extremity Assessment: Defer to OT evaluation    Lower Extremity Assessment  Lower Extremity Assessment: Generalized weakness(quads 3-/5 B, R knee and calf more edematous than L)    Cervical / Trunk Assessment Cervical / Trunk Assessment: Kyphotic  Communication   Communication: Other (comment)(possible somewhat of a language barrier versus AMS limiting communciation)  Cognition Arousal/Alertness: Awake/alert Behavior During Therapy: Restless Overall Cognitive Status: No family/caregiver present to determine baseline cognitive functioning                                  General Comments: restless and hx of pulling at lines, difficult to assess cognition and no family present to compare to baseline. Definitely seemed pleasantly confused during session today.      General Comments General comments (skin integrity, edema, etc.): speaking some English, does seem to understand simple cues intermittently, inconsistent cue following    Exercises     Assessment/Plan    PT Assessment Patient needs continued PT services  PT Problem List Decreased strength;Decreased cognition;Decreased knowledge of use of DME;Decreased activity tolerance;Decreased safety awareness;Decreased balance;Decreased mobility;Decreased coordination       PT Treatment Interventions DME instruction;Balance training;Gait training;Stair training;Cognitive remediation;Functional mobility training;Patient/family education;Therapeutic activities;Therapeutic exercise    PT Goals (Current goals can be found in the Care Plan section)  Acute Rehab PT Goals PT Goal Formulation: Patient unable to participate in goal setting Time For Goal Achievement: 02/20/19 Potential to Achieve Goals: Fair    Frequency Min 2X/week   Barriers to discharge        Co-evaluation PT/OT/SLP Co-Evaluation/Treatment: Yes Reason for Co-Treatment: Complexity of the patient's impairments (multi-system involvement);For patient/therapist safety PT goals addressed during session: Mobility/safety with mobility;Balance         AM-PAC PT "6 Clicks" Mobility  Outcome Measure Help needed turning from your back to your side while in a flat bed without using bedrails?: A Lot Help needed moving from lying on your back to sitting on the side of a flat bed without using bedrails?: A Lot Help needed moving to and from a bed to a chair (including a wheelchair)?: Total Help needed standing up from a chair using your arms (e.g., wheelchair or bedside chair)?: Total Help needed to walk in hospital room?: Total Help needed  climbing 3-5 steps with a railing? : Total 6 Click Score: 8    End of Session   Activity Tolerance: Patient tolerated treatment well Patient left: in bed;with call bell/phone within reach;with bed alarm set;with restraints reapplied Nurse Communication: Mobility status PT Visit Diagnosis: Muscle weakness (generalized) (M62.81);Difficulty in walking, not elsewhere classified (R26.2);Other abnormalities of gait and mobility (R26.89)    Time: 1093-2355 PT Time Calculation (min) (ACUTE ONLY): 25 min   Charges:   PT Evaluation $PT Eval Moderate Complexity: 1 Mod          Windell Norfolk, DPT, PN1   Supplemental Physical Therapist [place]    Pager [phone] Acute Rehab Office [phone]

## 2019-02-06 NOTE — Care Management Important Message (Signed)
Important Message  Patient Details  Name: LILIJA PITTARD MRN: [id number] Date of Birth: 02-19-1928   Medicare Important Message Given:  Yes     Shelda Altes 02/06/2019, 11:36 AM

## 2019-02-06 NOTE — Consult Note (Signed)
Consultation Note Date: 02/06/2019   Patient Name: Jocelyn Mccann  DOB: [birth year]  MRN: [id number]  Age / Sex: 86 y.o., female  PCP: Alycia Rossetti, MD Referring Physician: Tawni Millers  Reason for Consultation: Establishing goals of care  HPI/Patient Profile: 86 y.o. female  with past medical history of Alzheimer's dementia, seizure disorder, right breast cancer on anastrozole, frequent falls admitted on 02/01/2019 with unwitnessed fall and worsening confusion. Become obtunded in ED after witnessed seizure episode subsequently requiring intubation. Became hypotensive in ED and found to have glucose of 23. ICU admission for septic shock, likely secondary to UTI, encephalopathy, and seizure. Extubated 1/24. Receiving tube feeds via cortrak. Neurology following. Brain, c-spine MRI negative for acute process. Receiving Keppra. Pending PT/OT. Passed SLP evaluation on 1/26 and started on dysphagia diet. Palliative medicine consultation for goals of care.   Clinical Assessment and Goals of Care:  I have reviewed medical records, discussed with care team, and met with patient and daughter Jocelyn Mccann) at bedside to discuss goals of care. Patient is awake, oriented to self and will follow simple commands. She is pleasantly confused with baseline Alzheimer's dementia. She denies pain and does not appear to be in distress or discomfort. Daughter, Jocelyn Mccann at bedside. Dr. Cathlean Sauer also at bedside during [place] discussion.   Introduced Palliative Medicine as specialized medical care for people living with serious illness. It focuses on providing relief from the symptoms and stress of a serious illness. The goal is to improve quality of life for both the patient and the family.  We discussed a brief life review of the patient. Patient originally from [place]. Three children--Jocelyn Mccann in [place], son in [place], and daughter in  [place]. She is a retired Pharmacist, hospital. Samanth moved from [place] about two years ago to live with Jocelyn Mccann. Diagnosed with dementia about 4-5 years ago. The patient has had frequent falls and ED visits in the last few weeks.   Discussed events leading up to admission and course of hospitalization including diagnoses, interventions, plan of care. Discussed chronic, progressive nature of dementia and disease trajectory, explaining concern that after frequent falls and acute hospitalization, her mother very well could be at a new baseline with dementia.   I attempted to elicit values and goals of care important to the patient and daughter. Advanced directives, concepts specific to code status, artifical feeding and hydration were discussed. Patient does not have a documented living will. Jocelyn Mccann is primary decision-maker and caregiver but also discusses her mother's medical care with brother in [place]. Jocelyn Mccann becomes tearful during this discussion, sharing that she would want resuscitation attempted but also "I don't want her to suffer." Educated on medical recommendation for consideration of DNR/DNI code status with underlying irreversible dementia, age, frailty, poor outcomes of CPR and unlikelihood that her mother would survive resuscitative efforts with meaningful recovery. Introduced and discussed MOST form. Encouraged Jocelyn Mccann to continue discussions with her brother regarding quality of life aspects and what their wishes for their mother would be if critically ill and nearing EOL.  Jocelyn Mccann plans to further discuss with her brother. Hard Choices copy given for review.  Discussed next steps including PT/OT evaluations. Discussed SLP recommendations and hope that her mother will start eating orally. Jocelyn Mccann shares that it has become challenging caring for her mother at home. She is interested in SNF for rehab, also acknowledging her mother may require long-term care or memory care.   Palliative Care  services outpatient were explained and offered. Daughter agreeable.   Questions and concerns were addressed. PMT contact information given.     SUMMARY OF RECOMMENDATIONS    Continue FULL code/FULL scope treatment. Attending and PMT provider discussed code status with daughter and encouraged consideration for DNR/limitations to care with underlying dementia. Introduced MOST form. Daughter plans to further discuss with her brother.   Continue medical management and current plan of care.   Continue PT/OT/SLP efforts. Daughter hopeful for discharge to SNF rehab.  Outpatient palliative referral at SNF.  Code Status/Advance Care Planning:  Full code  Symptom Management:   Per attending  Palliative Prophylaxis:   Aspiration, Delirium Protocol, Oral Care and Turn Reposition  Additional Recommendations (Limitations, Scope, Preferences):  Full Scope Treatment  Psycho-social/Spiritual:   Desire for further Chaplaincy support: yes  Additional Recommendations: Caregiving  Support/Resources, Compassionate Wean Education and Education on Hospice  Prognosis:   Poor long-term prognosis with underlying progressive dementia  Discharge Planning: [place] for rehab with Palliative care service follow-up      Primary Diagnoses: Present on Admission: . SIRS (systemic inflammatory response syndrome) (HCC) . Septic shock ([place])   I have reviewed the medical record, interviewed the patient and family, and examined the patient. The following aspects are pertinent.  Past Medical History:  Diagnosis Date  . Bilateral knee pain   . Breast cancer ([place])    diagnosed 11/2017  . Dementia ([place])   . Osteoporosis   . Urge incontinence of urine    Social History   Socioeconomic History  . Marital status: Widowed    Spouse name: Not on file  . Number of children: 3  . Years of education: Not on file  . Highest education level: Not on file  Occupational History  . Not  on file  Tobacco Use  . Smoking status: Never Smoker  . Smokeless tobacco: Never Used  Substance and Sexual Activity  . Alcohol use: Never  . Drug use: Never  . Sexual activity: Not on file  Other Topics Concern  . Not on file  Social History Narrative  . Not on file   Social Determinants of Health   Financial Resource Strain:   . Difficulty of Paying Living Expenses: Not on file  Food Insecurity:   . Worried About Charity fundraiser in the Last Year: Not on file  . Ran Out of Food in the Last Year: Not on file  Transportation Needs:   . Lack of Transportation (Medical): Not on file  . Lack of Transportation (Non-Medical): Not on file  Physical Activity:   . Days of Exercise per Week: Not on file  . Minutes of Exercise per Session: Not on file  Stress:   . Feeling of Stress : Not on file  Social Connections:   . Frequency of Communication with Friends and Family: Not on file  . Frequency of Social Gatherings with Friends and Family: Not on file  . Attends Religious Services: Not on file  . Active Member of Clubs or Organizations: Not on file  . Attends Club or  Organization Meetings: Not on file  . Marital Status: Not on file   Family History  Problem Relation Age of Onset  . Cancer Mother        breast   Scheduled Meds: . chlorhexidine  15 mL Mouth Rinse BID  . Chlorhexidine Gluconate Cloth  6 each Topical Q0600  . feeding supplement (PRO-STAT SUGAR FREE 64)  30 mL Per Tube Daily  . insulin aspart  0-9 Units Subcutaneous Q4H  . levETIRAcetam  500 mg Oral BID  . mouth rinse  15 mL Mouth Rinse q12n4p   Continuous Infusions: . dextrose 5 % and 0.45% NaCl 75 mL/hr at 02/06/19 1134  . feeding supplement (GLUCERNA 1.2 CAL) 1,000 mL (02/05/19 1357)   PRN Meds:.acetaminophen **OR** acetaminophen, bisacodyl, LORazepam, [DISCONTINUED] ondansetron **OR** ondansetron (ZOFRAN) IV Medications Prior to Admission:  Prior to Admission medications   Medication Sig Start Date  End Date Taking? Authorizing Provider  anastrozole (ARIMIDEX) 1 MG tablet Take 1 tablet (1 mg total) by mouth daily. 11/27/18  Yes Magrinat, Virgie Dad, MD  cholecalciferol (VITAMIN D3) 25 MCG (1000 UT) tablet Take 1 tablet (1,000 Units total) by mouth daily. 02/02/18  Yes Magrinat, Virgie Dad, MD  diclofenac Sodium (VOLTAREN) 1 % GEL Apply 2-4 g topically See admin instructions. Apply 2-4 grams to both knees once daily for pain   Yes [provider]  donepezil (ARICEPT) 5 MG tablet TAKE 1 TABLET (5 MG TOTAL) BY MOUTH AT BEDTIME. FOR ALZHEIMER'S DISEASE Patient taking differently: Take 5 mg by mouth daily with breakfast. For Alzheimer's disease 01/29/19  Yes Sater, Nanine Means, MD  levETIRAcetam (KEPPRA) 250 MG tablet Take 1 tablet (250 mg total) by mouth 2 (two) times daily. Patient taking differently: Take 250 mg by mouth every morning.  01/08/19  Yes Sater, Nanine Means, MD  MELATONIN PO Take 1 tablet by mouth at bedtime as needed (for sleep).   Yes [provider]  Phenyleph-Doxylamine-DM-APAP (NYQUIL SEVERE COLD/FLU) 5-6.25-10-325 MG/15ML LIQD Take 5-10 mLs by mouth at bedtime as needed (for rhinitis).   Yes [provider]  QUEtiapine (SEROQUEL) 25 MG tablet TAKE 1 TABLET BY MOUTH EVERYDAY AT BEDTIME Patient taking differently: Take 25-50 mg by mouth at bedtime as needed (for sleep).  01/31/19  Yes Sater, Nanine Means, MD  sertraline (ZOLOFT) 25 MG tablet Take 1 tablet (25 mg total) by mouth daily. 12/05/18  Yes Sater, Nanine Means, MD  traMADol (ULTRAM) 50 MG tablet Take 1 tablet (50 mg total) by mouth every 6 (six) hours as needed for moderate pain. 08/25/18   Shelly Coss, MD   Allergies  Allergen Reactions  . Milk-Related Compounds Diarrhea  . Pork-Derived Products Other (See Comments)    PATIENT DOES NOT EAT PORK   Review of Systems  Unable to perform ROS: Dementia    Physical Exam Vitals and nursing note reviewed.  Constitutional:      Appearance: She is cachectic.  She is ill-appearing.  HENT:     Head: Normocephalic and atraumatic.  Cardiovascular:     Rate and Rhythm: Normal rate.  Pulmonary:     Effort: No tachypnea, accessory muscle usage or respiratory distress.  Abdominal:     Tenderness: There is no abdominal tenderness.  Skin:    General: Skin is warm and dry.  Neurological:     Mental Status: She is easily aroused.     Comments: Awake, oriented to name, otherwise disoriented. Follows simple commands.  Psychiatric:  Attention and Perception: She is inattentive.        Speech: Speech is delayed.        Cognition and Memory: Cognition is impaired.    Vital Signs: BP 120/69 (BP Location: Left Arm)   Pulse 71   Temp (!) 97.4 F (36.3 C) (Axillary)   Resp 18   Ht '5\' 2"'$  (1.575 m)   Wt 72.6 kg   SpO2 100%   BMI 29.27 kg/m  Pain Scale: 0-10   Pain Score: Asleep  SpO2: SpO2: 100 % O2 Device:SpO2: 100 % O2 Flow Rate: .O2 Flow Rate (L/min): 2 L/min  IO: Intake/output summary:   Intake/Output Summary (Last 24 hours) at 02/06/2019 1348 Last data filed at 02/05/2019 2100 Gross per 24 hour  Intake 1238.08 ml  Output 275 ml  Net 963.08 ml    LBM: Last BM Date: 01/31/19 Baseline Weight: Weight: 61 kg Most recent weight: Weight: 72.6 kg     Palliative Assessment/Data: PPS 40%   Flowsheet Rows     Most Recent Value  Intake Tab  Referral Department  Critical care  Unit at Time of Referral  ICU  Palliative Care Primary Diagnosis  Neurology  Palliative Care Type  New Palliative care  Reason for referral  Clarify Goals of Care  Date first seen by Palliative Care  02/04/19  Clinical Assessment  Palliative Performance Scale Score  40%  Psychosocial & Spiritual Assessment  Palliative Care Outcomes  Patient/Family meeting held?  Yes  Who was at the meeting?  daughter, Dr. Cathlean Sauer present  Palliative Care Outcomes  Clarified goals of care, Provided end of life care assistance, Provided psychosocial or spiritual support, ACP  counseling assistance, Linked to palliative care logitudinal support      Time In: 1000 Time Out: 1100  Time Total: 60 Greater than 50%  of this time was spent counseling and coordinating care related to the above assessment and plan.  Signed by:   Ihor Dow, DNP, FNP-C Palliative Medicine Team  Phone: [phone] Fax: [phone]   Please contact Palliative Medicine Team phone at [phone] for questions and concerns.  For individual provider: See Shea Evans

## 2019-02-06 NOTE — Progress Notes (Signed)
Patient pulled out her NG tube,she is alert and interactive but confused.Dr. Lucile Shutters Critical care on call made aware with instruction to leave NG tube out.Will continue to monitor.

## 2019-02-06 NOTE — Progress Notes (Signed)
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  She does have significant past medical history for breast cancer and dementia.  For last 7 days prior to hospitalization she had decrease cognitive function and decreased mentation.  She sustained a mechanical fall January 20 with laceration of her left eyebrow. She was discharged from the emergency department.  At home she had recurrent falls and she was brought back to the hospital, on her initial physical examination her temperature was 85 F, blood pressure 135/76, heart rate 53, respiratory rate 11, oxygen saturation 98%, calm, her lungs were clear bilaterally, heart S1-S2 present, bradycardic, abdomen soft, no lower extremity edema. Sodium 139, potassium 4.4, chloride 104, bicarb 27, glucose 173, BUN 29, creatinine 0.77, AST 179, ALT 185, CK 132, white count 1.1, hemoglobin 10.4, hematocrit 31.6, platelets 102.  SARS COVID-19 was negative.  Urinalysis showed 5 white cells, 0-5 red cells, specific gravity 1.016.  Head and cervical spine CT no acute changes.  Her chest radiograph left rotation, hyperinflation, no infiltrates.  EKG 51 bpm, normal axis, positive interventricular conduction delay, (QTC 430 manually corrected), sinus rhythm, no ST segment changes, negative T waves V4 to V6.   Patient was admitted with the working diagnosis of acute metabolic encephalopathy complicated with systemic inflammatory response.   Patient was placed on broad spectrum antibiotic therapy, his hypothermia was corrected and had frequent neuro checks.  While in the emergency department patient became hypotensive and worsening mentation.  His glucose dropped to 23 and patient developed seizure activity.  He received 2 mg of lorazepam and 1 g of Keppra.  He required intubation for airway  protection.  Urine culture was positive for Klebsiella species, and antibiotic therapy was narrowed to ceftriaxone.  Further work-up with brain MRI showed microhemorrhages.  Neurology recommended continue seizure prophylaxis with Keppra.  Patient's mentation improved and patient was liberated from mechanical ventilation January 24.  Transfer to [facility] hospitalist January 26.    Assessment & Plan:   Principal Problem:   Sepsis secondary to UTI [facility]) Active Problems:   Alzheimer disease ([place])   SDH (subdural hematoma) (HCC)   Septic shock (HCC)   Acute encephalopathy   Hypoglycemia without diagnosis of diabetes mellitus   Seizures ([place])   Pancytopenia ([place])   1. Septic shock due to urinary tract infection/ present on admission. Patient had hypotension that required vasopressors on admission. Today her blood pressure is 120/69. Wbc is 3,9, blood cultures with no growth, urine culture positive for Klebsiella pna (50,000 CFU), resistant to ampicillin. Patient has completed 5 doses of effective antibiotic therapy. Will decrease rate of IV fluids to 50 ml per/ h, patient still with no optimal po intake.   2. Acute metabolic encephalopathy/ seizures/ hypoglycemia. Patient is awake and alert, continue to be very confused and disorientated, not back to her baseline per her daughter at the bedside. Follows simple commands and responds simple questions. Swallow evaluation recommended dysphagia 1 diet, continue aspiration precautions. Fasting glucose this am 115 mg/dl. Hold on insulin coverage for now. Dc lorazepam for now.   Patient with progressive dementia. Continue keppra per neurology recommendations.   3. Pancytopenia. Likely multifactorial, possible marrow failure. Platelet count at 59, with Hct at 27,9 and wbc at 3,9. Continue close follow up of cell count, currently no indication for transfusion.  4. Ambulatory dysfunction/ frequent falls at home and history of subdural hematoma/   5.  Right heal stage 1 pressure ulcer. Present on admission, continue local wound care.   6. Advance and progressive dementia. Will resume donepezil, sertraline, and quetiapine.   Patient continue to be at high risk for worsening encephalopathy.   DVT prophylaxis: enoxaparin   Code Status:  full Family Communication: I spoke with patient's daughter at the bedside, we talked in detail about patient's condition, plan of care and prognosis and all questions were addressed. We talked about code status and for now patient continue full code.  Disposition Plan/ discharge barriers: pending SNf placement.    Nutrition Status:    Skin Documentation: Pressure Injury 02/05/19 Heel Right;Medial Stage 1 -  Intact skin with non-blanchable redness of a localized area usually over a bony prominence. (Active)  02/05/19 1200  Location: Heel  Location Orientation: Right;Medial  Staging: Stage 1 -  Intact skin with non-blanchable redness of a localized area usually over a bony prominence.  Wound Description (Comments):   Present on Admission: No     Consultants:   Neurology   Procedures:     Antimicrobials:       Subjective: Patient is more awake and alert, still very confused and disorientated, not agitated, all information from nursing at the bedside. Patient pulled her NG tube and continue to be on mittens.   Objective: Vitals:   02/06/19 0409 02/06/19 0500 02/06/19 0755 02/06/19 0855  BP: 95/79  120/69   Pulse: 79  71   Resp: 18  18   Temp: 98 F (36.7 C)  (!) 96 F (35.6 C) (!) 97.4 F (36.3 C)  TempSrc: Oral  Axillary Axillary  SpO2: 100%  100%   Weight:  72.6 kg    Height:        Intake/Output Summary (Last 24 hours) at 02/06/2019 0939 Last data filed at 02/05/2019 2100 Gross per 24 hour  Intake 1643.23 ml  Output 275 ml  Net 1368.23 ml   Filed Weights   02/03/19 0434 02/05/19 2235 02/06/19 0500  Weight: 67.6 kg 72.5 kg 72.6 kg    Examination:   General:  deconditioned and ill looking appearing  Neurology: Awake, confused and disorientated, mittens both hands. SHe does follow commands.  E ENT: mild pallor, no icterus, oral mucosa moist Cardiovascular: No JVD. S1-S2 present, rhythmic, no gallops, rubs, or murmurs. No lower extremity edema. Pulmonary: positive breath sounds bilaterally, adequate air movement, no wheezing, rhonchi or rales. Gastrointestinal. Abdomen with no organomegaly, non tender, no rebound or guarding Skin. No rashes Musculoskeletal: no joint deformities     Data Reviewed: I have personally reviewed following labs and imaging studies  CBC: Recent Labs  Lab 02/01/19 1450 02/01/19 2329 02/01/19 2346 02/01/19 2346 02/02/19 0115 02/02/19 0302 02/03/19 0312 02/04/19 0230 02/05/19 0217 02/05/19 1255  WBC 1.1*  --  2.5*  --   --   --  4.1 3.9* 3.6* 3.9*  NEUTROABS 0.4*  --   --   --   --   --  3.2 3.1  --  2.5  HGB 10.4*   < > 10.0*   < > 10.2*  --  9.3* 8.7* 8.2* 9.0*  HCT 31.6*   < > 29.7*   < > 30.0* 28.9* 28.3* 26.0* 25.1* 27.9*  MCV 99.7  --  98.7  --   --   --  99.0 98.9 100.4* 102.2*  PLT 102*  --  108*  --   --   --  81* 59* 58* 59*   < > = values in this interval not displayed.   Basic Metabolic Panel: Recent Labs  Lab 02/01/19 2346 02/01/19 2346 02/02/19 0115 02/02/19 0302 02/03/19 0312 02/04/19 0230 02/05/19 0217 02/06/19 0559  NA 141   < > 142  --  142 140 137 141  K 4.4   < > 4.7  --  4.3 3.8 4.0 4.3  CL 111  --   --   --  112* 112* 108 111  CO2 23  --   --   --  24 22 22  21*  GLUCOSE 107*  --   --   --  119* 103* 110* 115*  BUN 31*  --   --   --  29* 35* 33* 31*  CREATININE 0.98  --   --   --  1.05* 0.80 0.83 0.82  CALCIUM 8.9  --   --   --  8.6* 8.6* 8.6* 9.3  MG  --   --   --  1.6* 2.1  --  1.9  --   PHOS  --   --   --  2.6  --   --   --   --    < > = values in this interval not displayed.   GFR: Estimated Creatinine Clearance: 44.2 mL/min (by C-G formula based on SCr of 0.82  mg/dL). Liver Function Tests: Recent Labs  Lab 02/01/19 2346 02/03/19 0312 02/04/19 0230 02/05/19 0217 02/06/19 0559  AST 180* 143* 111* 87* 191*  ALT 171* 148* 129* 111* 184*  ALKPHOS 123 117 106 96 121  BILITOT 0.6 0.6 0.9 0.4 0.8  PROT 5.5* 5.2* 4.9* 4.8* 5.7*  ALBUMIN 2.8* 2.2* 2.1* 2.0* 2.3*   No results for input(s): LIPASE, AMYLASE in the last 168 hours. Recent Labs  Lab 02/01/19 2348  AMMONIA 28   Coagulation Profile: No results for input(s): INR, PROTIME in the last 168 hours. Cardiac Enzymes: Recent Labs  Lab 02/01/19 1450 02/01/19 2346  CKTOTAL 132 111   BNP (last 3 results) No results for input(s): PROBNP in the last 8760 hours. HbA1C: No results for input(s): HGBA1C in the last 72 hours. CBG: Recent Labs  Lab 02/05/19 1508 02/05/19 1929 02/06/19 0027 02/06/19 0457 02/06/19 0751  GLUCAP 111* 106* 105* 103* 89   Lipid Profile: Recent Labs    02/04/19 0230 02/05/19 0217  TRIG 15 24   Thyroid Function Tests: No results for input(s): TSH, T4TOTAL, FREET4, T3FREE, THYROIDAB in the last 72 hours. Anemia Panel: No results for input(s): VITAMINB12, FOLATE, FERRITIN, TIBC, IRON, RETICCTPCT in the last 72 hours.    Radiology Studies: I have reviewed all of the imaging during this hospital visit personally     Scheduled Meds: . chlorhexidine  15 mL Mouth Rinse BID  . Chlorhexidine Gluconate Cloth  6 each Topical Q0600  . feeding supplement (PRO-STAT SUGAR FREE 64)  30 mL Per Tube Daily  . insulin aspart  0-9 Units Subcutaneous Q4H  . mouth rinse  15 mL Mouth Rinse q12n4p   Continuous Infusions: . cefTRIAXone (ROCEPHIN)  IV Stopped (02/05/19 1157)  . dextrose 5 % and 0.45% NaCl 75 mL/hr at 02/05/19 2146  . feeding supplement (GLUCERNA 1.2 CAL) 1,000 mL (02/05/19 1357)  . levETIRAcetam 500 mg (02/05/19 2342)     LOS: 5 days        Marney Treloar Gerome Apley, MD

## 2019-02-06 NOTE — Evaluation (Signed)
Clinical/Bedside Swallow Evaluation Patient Details  Name: Jocelyn Mccann MRN: [id number] Date of Birth: 03/31/1928  Today's Date: 02/06/2019 Time: SLP Start Time (ACUTE ONLY): 0830 SLP Stop Time (ACUTE ONLY): 0850 SLP Time Calculation (min) (ACUTE ONLY): 20 min  Past Medical History:  Past Medical History:  Diagnosis Date  . Bilateral knee pain   . Breast cancer ([place])    diagnosed 11/2017  . Dementia ([place])   . Osteoporosis   . Urge incontinence of urine    Past Surgical History:  Past Surgical History:  Procedure Laterality Date  . BREAST LUMPECTOMY Right 2001  . BREAST LUMPECTOMY Right 12/2017  . BREAST LUMPECTOMY WITH SENTINEL LYMPH NODE BIOPSY Right 2001  . BREAST LUMPECTOMY WITH SENTINEL LYMPH NODE BIOPSY Right 12/30/2017   Procedure: RIGHT BREAST LUMPECTOMY WITH SENTINEL LYMPH NODE BIOPSY ERAS PATHWAY;  Surgeon: Stark Klein, MD;  Location: [facility];  Service: General;  Laterality: Right;  . DENTAL SURGERY     HPI:  Jocelyn Mccann is a 86 y.o. female with h/o right breast ca on anastrozole, Alzheimer's dementia, seizure disorder and recent history of falls who presented to the ED on 1/21 after an unwitnessed fall and worsening confusion over the last week. This is her third ED visit in the last 7 days for a fall. She was previously seen on 1/20 for a fall and required sutures above her left eye. On presentation to the ED, she was hypothermic and bradycardic with recorded temp of 28F on arrival. Bair hugger was placed and cultures were obtained. Urinalysis was largely unremarkable. CXR showed no evidence for pneumonia. MRi on 1/23 shows Punctate acute/early subacute infarcts within the right thalamus right thalamocapsular junction, right external capsule and right cerebellum. Pt also found to have osteophytes through the cervical spine.    Assessment / Plan / Recommendation Clinical Impression  Pt demonstrates decreased safety with PO intake due to altered mental status. Pt is  easily distractible, verbose with a mix of Spanish, [place] and general language of confusion. She is not immediately aware or responsive to visual and verbal offerings of PO, but eventually responds to straw or spoon placed in mouth with sips or oral manipulation. Consecutive straw sips are tolerated without incident, but when given bites of puree, pt often begins talking, or holds the bolus with an appearance of delay. No coughing or choking occurred as a result, but careful assist will be needed with feeding. Discussed with nursing student at bedside. Will starte a puree diet and thin liquids and f/u for potential upgrade if cognition improves.  SLP Visit Diagnosis: Dysphagia, oral phase (R13.11)    Aspiration Risk  Mild aspiration risk    Diet Recommendation Dysphagia 1 (Puree);Thin liquid   Liquid Administration via: Straw Medication Administration: Crushed with puree Supervision: Patient able to self feed    Other  Recommendations Oral Care Recommendations: Oral care BID   Follow up Recommendations Skilled Nursing facility      Frequency and Duration min 2x/week  2 weeks       Prognosis Prognosis for Safe Diet Advancement: Good      Swallow Study   General HPI: Jocelyn Mccann is a 86 y.o. female with h/o right breast ca on anastrozole, Alzheimer's dementia, seizure disorder and recent history of falls who presented to the ED on 1/21 after an unwitnessed fall and worsening confusion over the last week. This is her third ED visit in the last 7 days for a fall. She was previously seen  on 1/20 for a fall and required sutures above her left eye. On presentation to the ED, she was hypothermic and bradycardic with recorded temp of 29F on arrival. Bair hugger was placed and cultures were obtained. Urinalysis was largely unremarkable. CXR showed no evidence for pneumonia. MRi on 1/23 shows Punctate acute/early subacute infarcts within the right thalamus right thalamocapsular junction, right  external capsule and right cerebellum. Pt also found to have osteophytes through the cervical spine.  Type of Study: Bedside Swallow Evaluation Diet Prior to this Study: NPO Temperature Spikes Noted: No Respiratory Status: Room air History of Recent Intubation: No Behavior/Cognition: Alert;Distractible;Confused Oral Cavity Assessment: Within Functional Limits Oral Care Completed by SLP: No Oral Cavity - Dentition: Poor condition;Missing dentition Vision: Impaired for self-feeding Self-Feeding Abilities: Total assist Patient Positioning: Upright in bed Baseline Vocal Quality: Normal Volitional Cough: Cognitively unable to elicit Volitional Swallow: Unable to elicit    Oral/Motor/Sensory Function Overall Oral Motor/Sensory Function: Within functional limits   Ice Chips Ice chips: Impaired Presentation: Spoon Oral Phase Impairments: Poor awareness of bolus   Thin Liquid Thin Liquid: Impaired Presentation: Straw Oral Phase Impairments: Poor awareness of bolus    Nectar Thick Nectar Thick Liquid: Not tested   Honey Thick Honey Thick Liquid: Not tested   Puree Puree: Impaired Presentation: Spoon Oral Phase Impairments: Poor awareness of bolus;Reduced lingual movement/coordination Oral Phase Functional Implications: Prolonged oral transit;Oral holding Pharyngeal Phase Impairments: Suspected delayed Swallow   Solid     Solid: Not tested     Herbie Baltimore, MA CCC-SLP  Acute Rehabilitation Services Pager [phone] Office [phone]  Lynann Beaver 02/06/2019,9:36 AM

## 2019-02-06 NOTE — Progress Notes (Signed)
[facility] Progress Note Patient Name: Jocelyn Mccann DOB: [birth year] MRN: [id number]   Date of Service  02/06/2019  HPI/Events of Note  Pt's NG tube got pulled out, she is alert and interactive, swallow evaluation is pending this morning.  eICU Interventions  RN instructed that it is okay to leave NG tube out pending ST evaluation this morning.        Kerry Kass [name] 02/06/2019, 6:24 AM

## 2019-02-06 NOTE — Progress Notes (Signed)
Neurology Progress Note   S:// Seen and examined.  O:// Current vital signs: BP 120/69 (BP Location: Left Arm)   Pulse 71   Temp (!) 96 F (35.6 C) (Axillary)   Resp 18   Ht 5\' 2"  (1.575 m)   Wt 72.6 kg   SpO2 100%   BMI 29.27 kg/m  Vital signs in last 24 hours: Temp:  [94 F (34.4 C)-99 F (37.2 C)] 96 F (35.6 C) (01/26 0755) Pulse Rate:  [52-81] 71 (01/26 0755) Resp:  [9-24] 18 (01/26 0755) BP: (88-133)/(51-79) 120/69 (01/26 0755) SpO2:  [100 %] 100 % (01/26 0755) FiO2 (%):  [40 %] 40 % (01/25 1600) Weight:  [72.5 kg-72.6 kg] 72.6 kg (01/26 0500) General: Awake alert HEENT: Normocephalic atraumatic Cardiovascular: Regular rate rhythm Respiratory: Breathing comfortably Neurological exam Was initially sleeping but when woken by voice, woke up and was alert. Followed simple commands inconsistently Very hypophonic speech Mumbling some words Able to tell me her name Not able to tell me date or place. Cranial: Pupils equal round react light, extraocular was intact, facial symmetry difficult to ascertain due to edentulousness, blinks to threat from both sides. Motor exam: She is moving both her upper extremities spontaneously today.  Wiggles both toes to command. Sensory exam: Withdraws in all fours Coordination difficult to assess due to mentation   Medications  Current Facility-Administered Medications:  .  acetaminophen (TYLENOL) tablet 650 mg, 650 mg, Oral, Q6H PRN **OR** acetaminophen (TYLENOL) suppository 650 mg, 650 mg, Rectal, Q6H PRN, Schertz, Michele Mcalpine, MD .  bisacodyl (DULCOLAX) suppository 10 mg, 10 mg, Rectal, Daily PRN, Schertz, Michele Mcalpine, MD .  cefTRIAXone (ROCEPHIN) 1 g in sodium chloride 0.9 % 100 mL IVPB, 1 g, Intravenous, Q24H, Candee Furbish, MD, Stopped at 02/05/19 1157 .  chlorhexidine (PERIDEX) 0.12 % solution 15 mL, 15 mL, Mouth Rinse, BID, Halford Chessman, Vineet, MD, 15 mL at 02/05/19 2146 .  Chlorhexidine Gluconate Cloth 2 % PADS 6 each, 6 each,  Topical, Q0600, Bennie Pierini, MD, 6 each at 02/05/19 1212 .  dextrose 5 %-0.45 % sodium chloride infusion, , Intravenous, Continuous, Kathi Ludwig, MD, Last Rate: 75 mL/hr at 02/05/19 2146, New Bag at 02/05/19 2146 .  feeding supplement (GLUCERNA 1.2 CAL) liquid 1,000 mL, 1,000 mL, Per Tube, Continuous, Sood, Vineet, MD, Last Rate: 55 mL/hr at 02/05/19 1357, 1,000 mL at 02/05/19 1357 .  feeding supplement (PRO-STAT SUGAR FREE 64) liquid 30 mL, 30 mL, Per Tube, Daily, Halford Chessman, Vineet, MD, 30 mL at 02/05/19 1359 .  insulin aspart (novoLOG) injection 0-9 Units, 0-9 Units, Subcutaneous, Q4H, Schertz, Michele Mcalpine, MD, 1 Units at 02/04/19 0024 .  levETIRAcetam (KEPPRA) IVPB 500 mg/100 mL premix, 500 mg, Intravenous, Q12H, Bennie Pierini, MD, Last Rate: 400 mL/hr at 02/05/19 2342, 500 mg at 02/05/19 2342 .  LORazepam (ATIVAN) injection 2 mg, 2 mg, Intravenous, Q1H PRN, Bennie Pierini, MD .  MEDLINE mouth rinse, 15 mL, Mouth Rinse, q12n4p, Sood, Vineet, MD, 15 mL at 02/05/19 1821 .  [DISCONTINUED] ondansetron (ZOFRAN) tablet 4 mg, 4 mg, Oral, Q6H PRN **OR** ondansetron (ZOFRAN) injection 4 mg, 4 mg, Intravenous, Q6H PRN, Bennie Pierini, MD Labs CBC    Component Value Date/Time   WBC 3.9 (L) 02/05/2019 1255   RBC 2.73 (L) 02/05/2019 1255   HGB 9.0 (L) 02/05/2019 1255   HCT 27.9 (L) 02/05/2019 1255   HCT 28.9 (L) 02/02/2019 0302   PLT 59 (L) 02/05/2019 1255   MCV 102.2 (H)  02/05/2019 1255   MCH 33.0 02/05/2019 1255   MCHC 32.3 02/05/2019 1255   RDW 17.4 (H) 02/05/2019 1255   LYMPHSABS 0.7 02/05/2019 1255   MONOABS 0.6 02/05/2019 1255   EOSABS 0.0 02/05/2019 1255   BASOSABS 0.0 02/05/2019 1255    CMP     Component Value Date/Time   NA 141 02/06/2019 0559   K 4.3 02/06/2019 0559   CL 111 02/06/2019 0559   CO2 21 (L) 02/06/2019 0559   GLUCOSE 115 (H) 02/06/2019 0559   BUN 31 (H) 02/06/2019 0559   CREATININE 0.82 02/06/2019 0559   CREATININE 0.85 03/31/2017 1144    CALCIUM 9.3 02/06/2019 0559   PROT 5.7 (L) 02/06/2019 0559   ALBUMIN 2.3 (L) 02/06/2019 0559   AST 191 (H) 02/06/2019 0559   ALT 184 (H) 02/06/2019 0559   ALKPHOS 121 02/06/2019 0559   BILITOT 0.8 02/06/2019 0559   GFRNONAA >60 02/06/2019 0559   GFRNONAA 62 03/31/2017 1144   GFRAA >60 02/06/2019 0559   GFRAA 71 03/31/2017 1144    glycosylated hemoglobin  Lipid Panel     Component Value Date/Time   TRIG 24 02/05/2019 0217     Imaging I have reviewed images in epic and the results pertinent to this consultation are: No new imaging to review  Assessment: 86 year old brought in for altered mental status-likely related to sepsis-was found neutropenic, hypoglycemic, hypothermic and hypotensive. There was a question of seizure. On examination was weak in the upper extremities compared to lowers which is improved. Likely a sequela of prolonged encephalopathy from the above causes Brain and C-spine MRI were unremarkable for an acute process.   Impression: Multifocal toxic metabolic encephalopathy  Recommendations: Continue Keppra 500 twice daily PT OT Follow-up with outpatient neurology Neurology will be available as needed.  -- Amie Portland, MD [facility] Pager: [phone] If 7pm to 7am, please call on call as listed on AMION.

## 2019-02-07 ENCOUNTER — Inpatient Hospital Stay (HOSPITAL_COMMUNITY): Payer: Medicare HMO

## 2019-02-07 ENCOUNTER — Other Ambulatory Visit: Payer: Self-pay

## 2019-02-07 DIAGNOSIS — L8990 Pressure ulcer of unspecified site, unspecified stage: Secondary | ICD-10-CM | POA: Insufficient documentation

## 2019-02-07 LAB — CBC WITH DIFFERENTIAL/PLATELET
Abs Immature Granulocytes: 0.02 10*3/uL (ref 0.00–0.07)
Basophils Absolute: 0 10*3/uL (ref 0.0–0.1)
Basophils Relative: 0 %
Eosinophils Absolute: 0 10*3/uL (ref 0.0–0.5)
Eosinophils Relative: 1 %
HCT: 25.4 % — ABNORMAL LOW (ref 36.0–46.0)
Hemoglobin: 8.4 g/dL — ABNORMAL LOW (ref 12.0–15.0)
Immature Granulocytes: 1 %
Lymphocytes Relative: 12 %
Lymphs Abs: 0.4 10*3/uL — ABNORMAL LOW (ref 0.7–4.0)
MCH: 33.2 pg (ref 26.0–34.0)
MCHC: 33.1 g/dL (ref 30.0–36.0)
MCV: 100.4 fL — ABNORMAL HIGH (ref 80.0–100.0)
Monocytes Absolute: 0.9 10*3/uL (ref 0.1–1.0)
Monocytes Relative: 24 %
Neutro Abs: 2.3 10*3/uL (ref 1.7–7.7)
Neutrophils Relative %: 62 %
Platelets: 81 10*3/uL — ABNORMAL LOW (ref 150–400)
RBC: 2.53 MIL/uL — ABNORMAL LOW (ref 3.87–5.11)
RDW: 16.7 % — ABNORMAL HIGH (ref 11.5–15.5)
WBC: 3.7 10*3/uL — ABNORMAL LOW (ref 4.0–10.5)
nRBC: 0.8 % — ABNORMAL HIGH (ref 0.0–0.2)

## 2019-02-07 LAB — BASIC METABOLIC PANEL
Anion gap: 6 (ref 5–15)
BUN: 22 mg/dL (ref 8–23)
CO2: 24 mmol/L (ref 22–32)
Calcium: 9.4 mg/dL (ref 8.9–10.3)
Chloride: 107 mmol/L (ref 98–111)
Creatinine, Ser: 0.63 mg/dL (ref 0.44–1.00)
GFR calc Af Amer: >60 mL/min (ref >60)
GFR calc non Af Amer: >60 mL/min (ref >60)
Glucose, Bld: 105 mg/dL — ABNORMAL HIGH (ref 70–99)
Potassium: 3.9 mmol/L (ref 3.5–5.1)
Sodium: 137 mmol/L (ref 135–145)

## 2019-02-07 LAB — GLUCOSE, CAPILLARY
Glucose-Capillary: 102 mg/dL — ABNORMAL HIGH (ref 70–99)
Glucose-Capillary: 103 mg/dL — ABNORMAL HIGH (ref 70–99)
Glucose-Capillary: 115 mg/dL — ABNORMAL HIGH (ref 70–99)
Glucose-Capillary: 124 mg/dL — ABNORMAL HIGH (ref 70–99)
Glucose-Capillary: 89 mg/dL (ref 70–99)
Glucose-Capillary: 99 mg/dL (ref 70–99)

## 2019-02-07 NOTE — NC FL2 (Signed)
[place] LEVEL OF CARE SCREENING TOOL     IDENTIFICATION  Patient Name: Jocelyn Mccann Birthdate: 1928-12-28 Sex: female Admission Date (Current Location): 02/01/2019  [facility] and [place] Number:  Herbalist and Address:  [facility], [place] [address], [place], [place] [address]      Provider Number: [id number]  Attending Physician Name and Address:  Donnamae Jude, MD  Relative Name and Phone Number:  Patton Salles [phone] or (C) [phone]    Current Level of Care: Hospital Recommended Level of Care: [place] Prior Approval Number:    Date Approved/Denied:   PASRR Number: [id number]  Discharge Plan: SNF    Current Diagnoses: Patient Active Problem List   Diagnosis Date Noted  . Pressure injury of skin 02/07/2019  . Hypoglycemia without diagnosis of diabetes mellitus 02/06/2019  . Seizures ([place]) 02/06/2019  . Sepsis secondary to UTI ([place]) 02/06/2019  . Pancytopenia ([place]) 02/06/2019  . Palliative care by specialist   . Goals of care, counseling/discussion   . Acute encephalopathy   . Dysphagia   . Septic shock ([place]) 02/02/2019  . SIRS (systemic inflammatory response syndrome) ([place]) 02/01/2019  . Syncope 12/05/2018  . SDH (subdural hematoma) ([place]) 08/21/2018  . Malignant neoplasm of upper-outer quadrant of right breast in female, estrogen receptor positive ([place]) 12/20/2017  . Osteoarthritis of knee 06/01/2017  . Bilateral knee pain 03/31/2017  . Urge incontinence of urine 03/31/2017  . Generalized anxiety disorder 03/31/2017  . Osteoporosis without current pathological fracture 03/31/2017  . Alzheimer disease ([place]) 03/31/2017    Orientation RESPIRATION BLADDER Height & Weight     Self  Normal External catheter, Incontinent Weight: 167 lb 5.3 oz (75.9 kg) Height:  5\' 2"  (157.5 cm)  BEHAVIORAL SYMPTOMS/MOOD NEUROLOGICAL BOWEL NUTRITION STATUS      Continent Diet(please see  discharge summary)  AMBULATORY STATUS COMMUNICATION OF NEEDS Skin     Verbally Other (Comment)(pressure injury,right hell, stage I, wound/incision,laceration left eye)                       Personal Care Assistance Level of Assistance  Bathing, Feeding, Dressing Bathing Assistance: Maximum assistance Feeding assistance: Independent Dressing Assistance: Maximum assistance     Functional Limitations Info  Sight, Hearing, Speech Sight Info: Adequate Hearing Info: Adequate Speech Info: Adequate    SPECIAL CARE FACTORS FREQUENCY  PT (By licensed PT), OT (By licensed OT)     PT Frequency: 3xc per week OT Frequency: ex per week            Contractures Contractures Info: Not present    Additional Factors Info  Code Status, Allergies Code Status Info: FULL Allergies Info: Milk-related Compounds,Pork-derived Products           Current Medications (02/07/2019):  This is the current hospital active medication list Current Facility-Administered Medications  Medication Dose Route Frequency Provider Last Rate Last Admin  . acetaminophen (TYLENOL) tablet 650 mg  650 mg Oral Q6H PRN Bennie Pierini, MD   650 mg at 02/07/19 0957   Or  . acetaminophen (TYLENOL) suppository 650 mg  650 mg Rectal Q6H PRN Bennie Pierini, MD      . bisacodyl (DULCOLAX) suppository 10 mg  10 mg Rectal Daily PRN Bennie Pierini, MD      . chlorhexidine (PERIDEX) 0.12 % solution 15 mL  15 mL Mouth Rinse BID Chesley Mires, MD   15 mL at 02/07/19 0957  .  Chlorhexidine Gluconate Cloth 2 % PADS 6 each  6 each Topical Q0600 Bennie Pierini, MD   6 each at 02/05/19 1212  . dextrose 5 %-0.45 % sodium chloride infusion   Intravenous Continuous Tawni Millers, MD 50 mL/hr at 02/07/19 0436 New Bag at 02/07/19 0436  . donepezil (ARICEPT) tablet 5 mg  5 mg Oral QHS Arrien, Jimmy Picket, MD   5 mg at 02/06/19 2139  . levETIRAcetam (KEPPRA) 100 MG/ML solution 500 mg  500 mg Oral BID Donnamae Jude, RPH   500 mg at 02/07/19 1336  . MEDLINE mouth rinse  15 mL Mouth Rinse q12n4p Chesley Mires, MD   15 mL at 02/06/19 1654  . ondansetron (ZOFRAN) injection 4 mg  4 mg Intravenous Q6H PRN Bennie Pierini, MD      . QUEtiapine (SEROQUEL) tablet 25 mg  25 mg Oral QHS Tawni Millers, MD   25 mg at 02/06/19 2139  . sertraline (ZOLOFT) tablet 25 mg  25 mg Oral Daily Arrien, Jimmy Picket, MD   25 mg at 02/07/19 0957     Discharge Medications: Please see discharge summary for a list of discharge medications.  Relevant Imaging Results:  Relevant Lab Results:   Additional Information SSN [id number]              palliative to follow at [place], [place]

## 2019-02-07 NOTE — Progress Notes (Signed)
  Speech Language Pathology Treatment: Dysphagia  Patient Details Name: Jocelyn Mccann MRN: [id number] DOB: [birth year] Today's Date: 02/07/2019 Time: [id number] SLP Time Calculation (min) (ACUTE ONLY): 15 min  Assessment / Plan / Recommendation Clinical Impression  Fed pt breakfast, cognitive impairments still a barrier to self feeding and require max tactile and verbal cueing to achieve pt awareness of PO. No immediate signs of aspiration observed, but caution and cueing still needed. No potential for advancement yet.    HPI HPI: Jocelyn Mccann is a 86 y.o. female with h/o right breast ca on anastrozole, Alzheimer's dementia, seizure disorder and recent history of falls who presented to the ED on 1/21 after an unwitnessed fall and worsening confusion over the last week. This is her third ED visit in the last 7 days for a fall. She was previously seen on 1/20 for a fall and required sutures above her left eye. On presentation to the ED, she was hypothermic and bradycardic with recorded temp of 76F on arrival. Bair hugger was placed and cultures were obtained. Urinalysis was largely unremarkable. CXR showed no evidence for pneumonia. MRi on 1/23 shows Punctate acute/early subacute infarcts within the right thalamus right thalamocapsular junction, right external capsule and right cerebellum. Pt also found to have osteophytes through the cervical spine.       SLP Plan  Continue with current plan of care       Recommendations  Diet recommendations: Dysphagia 1 (puree);Thin liquid Liquids provided via: Straw Supervision: Full supervision/cueing for compensatory strategies;Trained caregiver to feed patient Compensations: Slow rate;Small sips/bites;Minimize environmental distractions Postural Changes and/or Swallow Maneuvers: Seated upright 90 degrees                Follow up Recommendations: Skilled Nursing facility SLP Visit Diagnosis: Dysphagia, oral phase (R13.11) Plan: Continue with  current plan of care       GO               Herbie Baltimore, MA [place] Pager [phone] Office [phone]  Lynann Beaver 02/07/2019, 10:39 AM

## 2019-02-07 NOTE — TOC Initial Note (Signed)
Transition of Care [facility]) - Initial/Assessment Note    Patient Details  Name: Jocelyn Mccann MRN: [id number] Date of Birth: 20-Jan-1928  Transition of [facility]) CM/SW Contact:    Vinie Sill, [place] Phone Number: 02/07/2019, 2:20 PM  Clinical Narrative:                  CSW visit with patient and her daughter, [name] at bedside. CSW introduced self and explained role. Patient's daughter reports the patient  lives in the home with her but it has become difficult to provide the level of care needed for the patient.  CSW discussed PT recommendation of SNF. Patient's daughter is in agreement with PT recommendations. CSW explained the SNF process. Patient's daughter preference is [facility], she has been there before. CSW was given permission to send other SNF referrals. Family states no questions or concerns at this time.   CSW will provide bed offers once available.   Thurmond Butts, MSW, [place] Clinical Social Worker    Expected Discharge Plan: Skilled Nursing Facility Barriers to Discharge: SNF Pending bed offer, Continued Medical Work up   Patient Goals and CMS Choice        Expected Discharge Plan and Services Expected Discharge Plan: [place] In-house Referral: Clinical Social Work                                            Prior Living Arrangements/Services   Lives with:: Self, Adult Children   Do you feel safe going back to the place where you live?: No      Need for Family Participation in Patient Care: Yes (Comment) Care giver support system in place?: Yes (comment)   Criminal Activity/Legal Involvement Pertinent to Current Situation/Hospitalization: No - Comment as needed  Activities of Daily Living      Permission Sought/Granted Permission sought to share information with : Family Supports, Customer service manager, Case Manager    Share Information with NAME: [name]  Permission granted to share info w  AGENCY: SNFs  Permission granted to share info w Relationship: daughter  Permission granted to share info w Contact Information: [phone]  Emotional Assessment Appearance:: Appears stated age     Orientation: : Oriented to Self Alcohol / Substance Use: Not Applicable Psych Involvement: No (comment)  Admission diagnosis:  Elevated LFTs [R79.89] SIRS (systemic inflammatory response syndrome) ([place]) [R65.10] Encounter for intubation [Z01.818] Acute encephalopathy [G93.40] Neutropenic fever ([place]) [D70.9, R50.81] Hypothermia, initial encounter [T68.XXXA] Septic shock ([place]) [A41.9, R65.21] Patient Active Problem List   Diagnosis Date Noted  . Pressure injury of skin 02/07/2019  . Hypoglycemia without diagnosis of diabetes mellitus 02/06/2019  . Seizures ([place]) 02/06/2019  . Sepsis secondary to UTI ([place]) 02/06/2019  . Pancytopenia ([place]) 02/06/2019  . Palliative care by specialist   . Goals of care, counseling/discussion   . Acute encephalopathy   . Dysphagia   . Septic shock ([place]) 02/02/2019  . SIRS (systemic inflammatory response syndrome) ([place]) 02/01/2019  . Syncope 12/05/2018  . SDH (subdural hematoma) ([place]) 08/21/2018  . Malignant neoplasm of upper-outer quadrant of right breast in female, estrogen receptor positive ([place]) 12/20/2017  . Osteoarthritis of knee 06/01/2017  . Bilateral knee pain 03/31/2017  . Urge incontinence of urine 03/31/2017  . Generalized anxiety disorder 03/31/2017  . Osteoporosis without current pathological fracture 03/31/2017  . Alzheimer disease ([place]) 03/31/2017  PCP:  Alycia Rossetti, MD Pharmacy:   [id number] - [place], [place] [address] [address] Phone: [phone] Fax: [phone]     Social Determinants of Health (SDOH) Interventions    Readmission Risk Interventions No flowsheet data found.

## 2019-02-07 NOTE — Progress Notes (Signed)
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  She does have significant past medical history for breast cancer and dementia.  For last 7 days prior to hospitalization she had decrease cognitive function and decreased mentation.  She sustained a mechanical fall January 20 with laceration of her left eyebrow. She was discharged from the emergency department.  At home she had recurrent falls and she was brought back to the hospital, on her initial physical examination her temperature was 85 F, blood pressure 135/76, heart rate 53, respiratory rate 11, oxygen saturation 98%, calm, her lungs were clear bilaterally, heart S1-S2 present, bradycardic, abdomen soft, no lower extremity edema. Sodium 139, potassium 4.4, chloride 104, bicarb 27, glucose 173, BUN 29, creatinine 0.77, AST 179, ALT 185, CK 132, white count 1.1, hemoglobin 10.4, hematocrit 31.6, platelets 102.  SARS COVID-19 was negative.  Urinalysis showed 5 white cells, 0-5 red cells, specific gravity 1.016.  Head and cervical spine CT no acute changes.  Her chest radiograph left rotation, hyperinflation, no infiltrates.  EKG 51 bpm, normal axis, positive interventricular conduction delay, (QTC 430 manually corrected), sinus rhythm, no ST segment changes, negative T waves V4 to V6.   Patient was admitted with the working diagnosis of acute metabolic encephalopathy complicated with systemic inflammatory response.   Patient was placed on broad spectrum antibiotic therapy, her hypothermia was corrected and had frequent neuro checks.  While in the emergency department patient became hypotensive and worsening mentation.  Her glucose dropped to 23 and patient developed seizure activity.  She received 2 mg of lorazepam and 1 g of Keppra.  She required intubation for  airway protection.  Urine culture was positive for Klebsiella species, and antibiotic therapy was narrowed to ceftriaxone.  Further work-up with brain MRI showed microhemorrhages.  Neurology recommended continue seizure prophylaxis with Keppra.  Patient's mentation improved and patient was liberated from mechanical ventilation January 24.  Transfer to [facility] January 26.  Assessment & Plan:   Principal Problem:   Sepsis secondary to UTI [name]) Active Problems:   Alzheimer disease ([place])   SDH (subdural hematoma) (HCC)   Septic shock (HCC)   Acute encephalopathy   Hypoglycemia without diagnosis of diabetes mellitus   Seizures (HCC)   Pancytopenia (HCC)   Pressure injury of skin  1. Septic shock due to urinary tract infection/ present on admission. Patient had hypotension that required vasopressors on admission. Today her blood pressure is 120/69. Wbc is 3,9, blood cultures with no growth, urine culture positive for Klebsiella (50,000 CFU), resistant to ampicillin. Patient has completed 5 doses of effective antibiotic therapy. Will decrease rate of IV fluids to 50 ml per/ h, patient still with no optimal po intake. States she is hungry today.  2. Acute metabolic encephalopathy/ seizures/ hypoglycemia. Patient is awake and alert, continue to be very confused and disorientated, not back to her baseline per her daughter at the bedside. Follows simple commands and responds simple questions. Swallow evaluation recommended dysphagia 1 diet, continue aspiration precautions. Fasting glucose this am 115 mg/dl. Hold on insulin coverage for now. Dc lorazepam for now.   Patient with progressive dementia. Continue keppra per neurology recommendations.   3. Pancytopenia. Likely multifactorial, possible marrow failure. Platelet count at 59, with Hct at 27,9 and wbc at 3,9. Continue close follow up of cell count,  currently no indication for transfusion.   4. Ambulatory dysfunction/ frequent  falls at home and history of subdural hematoma/PT has seen and recommends SNF   5. Right heal stage 1 pressure ulcer. Present on admission, continue local wound care.   6. Advance and progressive dementia. Will resume donepezil, sertraline, and quetiapine.   7. B 12 deficiency S/p 1000 mcg B 12 injection--will need ongoing repletion as an outpt.  8. Right knee swelling - new 02/07/2019 For X-ray, may need ortho consult   Patient continue to be at high risk for worsening encephalopathy.   DVT prophylaxis: Lovenox SQ Code Status: Full code  Family Communication: daughter [name], by phone. Disposition Plan: SNF   Consultants:   CCM  Palliative care  Neurology  Procedures:  Intubation  Suture  EEG  Antimicrobials: Anti-infectives (From admission, onward)   Start     Dose/Rate Route Frequency Ordered Stop   02/04/19 1100  cefTRIAXone (ROCEPHIN) 1 g in sodium chloride 0.9 % 100 mL IVPB     1 g 200 mL/hr over 30 Minutes Intravenous Every 24 hours 02/04/19 0950 02/06/19 1142   02/02/19 1800  vancomycin (VANCOCIN) IVPB 1000 mg/200 mL premix  Status:  Discontinued     1,000 mg 200 mL/hr over 60 Minutes Intravenous Every 24 hours 02/01/19 1833 02/03/19 1018   02/02/19 0600  ceFEPIme (MAXIPIME) 2 g in sodium chloride 0.9 % 100 mL IVPB  Status:  Discontinued     2 g 200 mL/hr over 30 Minutes Intravenous Every 12 hours 02/01/19 1718 02/04/19 0950   02/01/19 1700  ceFEPIme (MAXIPIME) 2 g in sodium chloride 0.9 % 100 mL IVPB     2 g 200 mL/hr over 30 Minutes Intravenous  Once 02/01/19 1648 02/01/19 1801   02/01/19 1645  vancomycin (VANCOCIN) IVPB 1000 mg/200 mL premix     1,000 mg 200 mL/hr over 60 Minutes Intravenous  Once 02/01/19 1644 02/01/19 1903   02/01/19 1445  cefTRIAXone (ROCEPHIN) 1 g in sodium chloride 0.9 % 100 mL IVPB     1 g 200 mL/hr over 30 Minutes Intravenous  Once 02/01/19 1443 02/01/19 1533       Subjective: RN notes swollen right knee.  Patient is unwilling to move leg today. She is responsive, cooperative, oriented to person, not oriented to date.  Objective: Vitals:   02/06/19 2200 02/07/19 0400 02/07/19 0433 02/07/19 0819  BP:   127/66 123/65  Pulse: 64 98 84 60  Resp:   18 16  Temp:   97.6 F (36.4 C) 98 F (36.7 C)  TempSrc:   Oral Oral  SpO2:   100% 94%  Weight:   75.9 kg   Height:        Intake/Output Summary (Last 24 hours) at 02/07/2019 1045 Last data filed at 02/07/2019 0512 Gross per 24 hour  Intake 480 ml  Output 850 ml  Net -370 ml   Filed Weights   02/05/19 2235 02/06/19 0500 02/07/19 0433  Weight: 72.5 kg 72.6 kg 75.9 kg    Examination:  General exam: Appears calm and comfortable, quite uncomfortable with movement of right extremity.  Respiratory system: Clear to auscultation. Respiratory effort normal. Cardiovascular system: S1 & S2 heard, RRR.  Gastrointestinal system: Abdomen is nondistended, soft and nontender.  Central nervous system: Alert and oriented to person Extremities: Significant swelling of the right knee. Skin: small red blister on right foot. Psychiatry: Judgement and insight appear normal. Mood & affect appropriate.   Data Reviewed: I  have personally reviewed following labs and imaging studies  CBC: Recent Labs  Lab 02/01/19 1450 02/01/19 2329 02/03/19 0312 02/04/19 0230 02/05/19 0217 02/05/19 1255 02/07/19 0227  WBC 1.1*   < > 4.1 3.9* 3.6* 3.9* 3.7*  NEUTROABS 0.4*  --  3.2 3.1  --  2.5 2.3  HGB 10.4*   < > 9.3* 8.7* 8.2* 9.0* 8.4*  HCT 31.6*   < > 28.3* 26.0* 25.1* 27.9* 25.4*  MCV 99.7   < > 99.0 98.9 100.4* 102.2* 100.4*  PLT 102*   < > 81* 59* 58* 59* 81*   < > = values in this interval not displayed.   Basic Metabolic Panel: Recent Labs  Lab 02/01/19 2346 02/02/19 0302 02/03/19 0312 02/04/19 0230 02/05/19 0217 02/06/19 0559 02/07/19 0227  NA   < >  --  142 140 137 141 137  K   < >  --  4.3 3.8 4.0 4.3 3.9  CL   < >  --  112* 112* 108 111 107   CO2   < >  --  24 22 22  21* 24  GLUCOSE   < >  --  119* 103* 110* 115* 105*  BUN   < >  --  29* 35* 33* 31* 22  CREATININE   < >  --  1.05* 0.80 0.83 0.82 0.63  CALCIUM   < >  --  8.6* 8.6* 8.6* 9.3 9.4  MG  --  1.6* 2.1  --  1.9  --   --   PHOS  --  2.6  --   --   --   --   --    < > = values in this interval not displayed.   GFR: Estimated Creatinine Clearance: 45.5 mL/min (by C-G formula based on SCr of 0.63 mg/dL). Liver Function Tests: Recent Labs  Lab 02/01/19 2346 02/03/19 0312 02/04/19 0230 02/05/19 0217 02/06/19 0559  AST 180* 143* 111* 87* 191*  ALT 171* 148* 129* 111* 184*  ALKPHOS 123 117 106 96 121  BILITOT 0.6 0.6 0.9 0.4 0.8  PROT 5.5* 5.2* 4.9* 4.8* 5.7*  ALBUMIN 2.8* 2.2* 2.1* 2.0* 2.3*    Recent Labs  Lab 02/01/19 2348  AMMONIA 28   Cardiac Enzymes: Recent Labs  Lab 02/01/19 1450 02/01/19 2346  CKTOTAL 132 111   CBG: Recent Labs  Lab 02/06/19 1609 02/06/19 2035 02/07/19 0043 02/07/19 0455 02/07/19 0814  GLUCAP 85 97 99 103* 102*   Lipid Profile: Recent Labs    02/05/19 0217  TRIG 24   Sepsis Labs: Recent Labs  Lab 02/01/19 1433 02/01/19 2346 02/02/19 0302  LATICACIDVEN 1.4 1.9 1.1    Recent Results (from the past 240 hour(s))  Blood Culture (routine x 2)     Status: None   Collection Time: 02/01/19  2:31 PM   Specimen: BLOOD  Result Value Ref Range Status   Specimen Description BLOOD RIGHT ANTECUBITAL  Final   Special Requests   Final    BOTTLES DRAWN AEROBIC AND ANAEROBIC Blood Culture adequate volume   Culture   Final    NO GROWTH 5 DAYS Performed at [facility], [place] [address], [place], [place] [address]    Report Status 02/06/2019 FINAL  Final  Blood Culture (routine x 2)     Status: None   Collection Time: 02/01/19  2:40 PM   Specimen: BLOOD  Result Value Ref Range Status   Specimen Description BLOOD BLOOD LEFT FOREARM  Final   Special Requests   Final    BOTTLES DRAWN AEROBIC AND ANAEROBIC Blood  Culture adequate volume   Culture   Final    NO GROWTH 5 DAYS Performed at [facility], [address] [place], [place] [address]    Report Status 02/06/2019 FINAL  Final  Respiratory Panel by RT PCR (Flu A&B, Covid) - Nasopharyngeal Swab     Status: None   Collection Time: 02/01/19  3:21 PM   Specimen: Nasopharyngeal Swab  Result Value Ref Range Status   SARS Coronavirus 2 by RT PCR NEGATIVE NEGATIVE Final    Comment: (NOTE) SARS-CoV-2 target nucleic acids are NOT DETECTED. The SARS-CoV-2 RNA is generally detectable in upper respiratoy specimens during the acute phase of infection. The lowest concentration of SARS-CoV-2 viral copies this assay can detect is 131 copies/mL. A negative result does not preclude SARS-Cov-2 infection and should not be used as the sole basis for treatment or other patient management decisions. A negative result may occur with  improper specimen collection/handling, submission of specimen other than nasopharyngeal swab, presence of viral mutation(s) within the areas targeted by this assay, and inadequate number of viral copies (<131 copies/mL). A negative result must be combined with clinical observations, patient history, and epidemiological information. The expected result is Negative. Fact Sheet for Patients:  PinkCheek.be Fact Sheet for Healthcare Providers:  GravelBags.it This test is not yet ap proved or cleared by the [place] FDA and  has been authorized for detection and/or diagnosis of SARS-CoV-2 by FDA under an Emergency Use Authorization (EUA). This EUA will remain  in effect (meaning this test can be used) for the duration of the COVID-19 declaration under Section 564(b)(1) of the Act, 21 U.S.C. section 360bbb-3(b)(1), unless the authorization is terminated or revoked sooner.    Influenza A by PCR NEGATIVE NEGATIVE Final   Influenza B by PCR NEGATIVE NEGATIVE Final     Comment: (NOTE) The Xpert Xpress SARS-CoV-2/FLU/RSV assay is intended as an aid in  the diagnosis of influenza from Nasopharyngeal swab specimens and  should not be used as a sole basis for treatment. Nasal washings and  aspirates are unacceptable for Xpert Xpress SARS-CoV-2/FLU/RSV  testing. Fact Sheet for Patients: PinkCheek.be Fact Sheet for Healthcare Providers: GravelBags.it This test is not yet approved or cleared by the [place] FDA and  has been authorized for detection and/or diagnosis of SARS-CoV-2 by  FDA under an Emergency Use Authorization (EUA). This EUA will remain  in effect (meaning this test can be used) for the duration of the  Covid-19 declaration under Section 564(b)(1) of the Act, 21  U.S.C. section 360bbb-3(b)(1), unless the authorization is  terminated or revoked. Performed at [facility], [place] [address] [place], [place] [address]   Urine culture     Status: Abnormal   Collection Time: 02/01/19  4:49 PM   Specimen: In/Out Cath Urine  Result Value Ref Range Status   Specimen Description IN/OUT CATH URINE  Final   Special Requests   Final    NONE Performed at [facility], [place] [address], [place], [place] [address]    Culture 50,000 COLONIES/mL KLEBSIELLA PNEUMONIAE (A)  Final   Report Status 02/04/2019 FINAL  Final   Organism ID, Bacteria KLEBSIELLA PNEUMONIAE (A)  Final      Susceptibility   Klebsiella pneumoniae - MIC*    AMPICILLIN >=32 RESISTANT Resistant     CEFAZOLIN <=4 SENSITIVE Sensitive     CEFTRIAXONE <=  0.25 SENSITIVE Sensitive     CIPROFLOXACIN <=0.25 SENSITIVE Sensitive     GENTAMICIN <=1 SENSITIVE Sensitive     IMIPENEM <=0.25 SENSITIVE Sensitive     NITROFURANTOIN 128 RESISTANT Resistant     TRIMETH/SULFA <=20 SENSITIVE Sensitive     AMPICILLIN/SULBACTAM 8 SENSITIVE Sensitive     PIP/TAZO <=4 SENSITIVE Sensitive     * 50,000 COLONIES/mL KLEBSIELLA PNEUMONIAE    MRSA PCR Screening     Status: None   Collection Time: 02/02/19  2:40 AM   Specimen: Nasal Mucosa; Nasopharyngeal  Result Value Ref Range Status   MRSA by PCR NEGATIVE NEGATIVE Final    Comment:        The GeneXpert MRSA Assay (FDA approved for NASAL specimens only), is one component of a comprehensive MRSA colonization surveillance program. It is not intended to diagnose MRSA infection nor to guide or monitor treatment for MRSA infections. Performed at [facility], [place] [address] [place], [place] [address]       Radiology Studies: No results found.   Scheduled Meds: . chlorhexidine  15 mL Mouth Rinse BID  . Chlorhexidine Gluconate Cloth  6 each Topical Q0600  . donepezil  5 mg Oral QHS  . levETIRAcetam  500 mg Oral BID  . mouth rinse  15 mL Mouth Rinse q12n4p  . QUEtiapine  25 mg Oral QHS  . sertraline  25 mg Oral Daily   Continuous Infusions: . dextrose 5 % and 0.45% NaCl 50 mL/hr at 02/07/19 0436     LOS: 6 days    Donnamae Jude, MD 02/07/2019 10:45 AM [phone] [facility] If 7PM-7AM, please contact night-coverage 02/07/2019, 10:45 AM

## 2019-02-07 NOTE — Progress Notes (Signed)
Physical Therapy Treatment Patient Details Name: Jocelyn Mccann MRN: [id number] DOB: 12/19/1928 Today's Date: 02/07/2019    History of Present Illness Jocelyn Mccann is a 86 y.o. female with PMH of breast cancer on treatment with anastrozole, dementia, recurrent syncope, presenting from home due to fall and AMS. In ED Pt was found neutropenic, hypoglycemic, hypothermic and hypotensive. Brain and C-spine MRI were unremarkable for an acute process, likely Pt with Multifocal toxic metabolic encephalopathy.    PT Comments    Patient received in bed, very pleasantly confused and chattering mostly in Spanish but able to understand and follow simple commands/statements in Spanish. R knee considerably more swollen than L in comparison to session yesterday, also much more painful to touch and with mobility. Attempted bed mobility but patient grimacing and crying out in pain, unable to perform due to severe levels of pain. Unable to really get any details about her pain due to language barrier. She was left in bed positioned to comfort with all needs met and bed alarm active. RN aware of patient status and PT concerns about R knee edema/pain.     Follow Up Recommendations  Supervision/Assistance - 24 hour;SNF     Equipment Recommendations  Other (comment)(defer)    Recommendations for Other Services       Precautions / Restrictions Precautions Precautions: Fall;Other (comment) Precaution Comments: restless/pulling at lines; somewhat of a language barrier; painful R knee Restrictions Weight Bearing Restrictions: No    Mobility  Bed Mobility               General bed mobility comments: attempted- patient adamantly refuse/unable to tolerate due to pain  Transfers                 General transfer comment: deferred- pain/refusal  Ambulation/Gait             General Gait Details: deferred- safety/pain/refusal   Stairs             Wheelchair Mobility     Modified Rankin (Stroke Patients Only)       Balance Overall balance assessment: Needs assistance Sitting-balance support: Feet supported;Bilateral upper extremity supported Sitting balance-Leahy Scale: Poor Sitting balance - Comments: Min-ModA for midline                                    Cognition Arousal/Alertness: Awake/alert Behavior During Therapy: Restless Overall Cognitive Status: No family/caregiver present to determine baseline cognitive functioning                                 General Comments: pleasantly confused and tending more towards chatter in spanish today but able to understand english. Difficult to fully assess cognition due to confusion and language, no family present to provide baseline. Per chart some dementia at baseline      Exercises      General Comments General comments (skin integrity, edema, etc.): fluctuating between english and spanish but tending to chatter more in Spanish today, inconsistent cue following; R knee much more swollen and edematous, painful than yesterday- RN alerted      Pertinent Vitals/Pain Pain Assessment: Faces Faces Pain Scale: Hurts whole lot Pain Location: R knee with movements and touching Pain Descriptors / Indicators: Aching;Sore;Sharp;Grimacing;Guarding Pain Intervention(s): Limited activity within patient's tolerance;Monitored during session    Home Living  Prior Function            PT Goals (current goals can now be found in the care plan section) Acute Rehab PT Goals Patient Stated Goal: none stated PT Goal Formulation: Patient unable to participate in goal setting Time For Goal Achievement: 02/20/19 Potential to Achieve Goals: Fair Progress towards PT goals: Not progressing toward goals - comment(limited by pain)    Frequency    Min 2X/week      PT Plan Current plan remains appropriate    Co-evaluation              AM-PAC  PT "6 Clicks" Mobility   Outcome Measure  Help needed turning from your back to your side while in a flat bed without using bedrails?: A Lot Help needed moving from lying on your back to sitting on the side of a flat bed without using bedrails?: A Lot Help needed moving to and from a bed to a chair (including a wheelchair)?: Total Help needed standing up from a chair using your arms (e.g., wheelchair or bedside chair)?: Total Help needed to walk in hospital room?: Total Help needed climbing 3-5 steps with a railing? : Total 6 Click Score: 8    End of Session   Activity Tolerance: Patient limited by pain Patient left: in bed;with call bell/phone within reach;with bed alarm set;with restraints reapplied(mitts) Nurse Communication: Mobility status;Other (comment)(R knee) PT Visit Diagnosis: Muscle weakness (generalized) (M62.81);Difficulty in walking, not elsewhere classified (R26.2);Other abnormalities of gait and mobility (R26.89)     Time: 0910-0930 PT Time Calculation (min) (ACUTE ONLY): 20 min  Charges:  $Therapeutic Activity: 8-22 mins                     Windell Norfolk, DPT, PN1   Supplemental Physical Therapist [place]    Pager [phone] Acute Rehab Office [phone]

## 2019-02-07 NOTE — Progress Notes (Signed)
Report called in to nurse [facility]M room 16 prior to transfer.  Daughter at bedside

## 2019-02-07 NOTE — Patient Outreach (Signed)
[facility] [facility] Care Management  02/07/2019  Jocelyn Mccann [birth year] [id number]   Follow up call to patient's daughter today.  Had been communicating with daughter prior to hospitalization about placement options/process for patient.  Patient being recommended for SNF upon discharge from hospital.  Informed daughter that SNF Social Worker will be able to further assist with long-term placement process and that this process typically goes much smoother if patient is already in hospital and/or SNF versus being placed from home.   Reminded daughter that Medicaid application was mailed last week and encouraged her to start working on it once received.  Will continue monitoring patient's chart for discharge plan.    Ronn Melena, BSW Social Worker [phone]

## 2019-02-08 LAB — CBC WITH DIFFERENTIAL/PLATELET
Abs Immature Granulocytes: 0.01 10*3/uL (ref 0.00–0.07)
Basophils Absolute: 0 10*3/uL (ref 0.0–0.1)
Basophils Relative: 0 %
Eosinophils Absolute: 0 10*3/uL (ref 0.0–0.5)
Eosinophils Relative: 1 %
HCT: 26.1 % — ABNORMAL LOW (ref 36.0–46.0)
Hemoglobin: 8.6 g/dL — ABNORMAL LOW (ref 12.0–15.0)
Immature Granulocytes: 0 %
Lymphocytes Relative: 12 %
Lymphs Abs: 0.5 10*3/uL — ABNORMAL LOW (ref 0.7–4.0)
MCH: 33.2 pg (ref 26.0–34.0)
MCHC: 33 g/dL (ref 30.0–36.0)
MCV: 100.8 fL — ABNORMAL HIGH (ref 80.0–100.0)
Monocytes Absolute: 1 10*3/uL (ref 0.1–1.0)
Monocytes Relative: 22 %
Neutro Abs: 2.7 10*3/uL (ref 1.7–7.7)
Neutrophils Relative %: 65 %
Platelets: 72 10*3/uL — ABNORMAL LOW (ref 150–400)
RBC: 2.59 MIL/uL — ABNORMAL LOW (ref 3.87–5.11)
RDW: 16.9 % — ABNORMAL HIGH (ref 11.5–15.5)
WBC: 4.2 10*3/uL (ref 4.0–10.5)
nRBC: 0.5 % — ABNORMAL HIGH (ref 0.0–0.2)

## 2019-02-08 LAB — BASIC METABOLIC PANEL
Anion gap: 5 (ref 5–15)
BUN: 19 mg/dL (ref 8–23)
CO2: 26 mmol/L (ref 22–32)
Calcium: 9.4 mg/dL (ref 8.9–10.3)
Chloride: 107 mmol/L (ref 98–111)
Creatinine, Ser: 0.74 mg/dL (ref 0.44–1.00)
GFR calc Af Amer: >60 mL/min (ref >60)
GFR calc non Af Amer: >60 mL/min (ref >60)
Glucose, Bld: 111 mg/dL — ABNORMAL HIGH (ref 70–99)
Potassium: 4.3 mmol/L (ref 3.5–5.1)
Sodium: 138 mmol/L (ref 135–145)

## 2019-02-08 LAB — GLUCOSE, CAPILLARY
Glucose-Capillary: 104 mg/dL — ABNORMAL HIGH (ref 70–99)
Glucose-Capillary: 104 mg/dL — ABNORMAL HIGH (ref 70–99)
Glucose-Capillary: 110 mg/dL — ABNORMAL HIGH (ref 70–99)
Glucose-Capillary: 88 mg/dL (ref 70–99)
Glucose-Capillary: 93 mg/dL (ref 70–99)
Glucose-Capillary: 96 mg/dL (ref 70–99)
Glucose-Capillary: 97 mg/dL (ref 70–99)

## 2019-02-08 LAB — SYNOVIAL CELL COUNT + DIFF, W/ CRYSTALS
Eosinophils-Synovial: 0 % (ref 0–1)
Lymphocytes-Synovial Fld: 3 % (ref 0–20)
Monocyte-Macrophage-Synovial Fluid: 26 % — ABNORMAL LOW (ref 50–90)
Neutrophil, Synovial: 71 % — ABNORMAL HIGH (ref 0–25)
WBC, Synovial: 13300 /mm3 — ABNORMAL HIGH (ref 0–200)

## 2019-02-08 MED ORDER — ENSURE ENLIVE PO LIQD
237.0000 mL | Freq: Three times a day (TID) | ORAL | Status: DC
  Administered 2019-02-08 – 2019-02-13 (×13): 237 mL via ORAL

## 2019-02-08 MED ORDER — LIDOCAINE HCL (PF) 2 % IJ SOLN
0.0000 mL | Freq: Once | INTRAMUSCULAR | Status: DC | PRN
  Filled 2019-02-08: qty 20

## 2019-02-08 MED ORDER — LIDOCAINE HCL (PF) 2 % IJ SOLN: 0.0000 mL | Freq: Once | INTRAMUSCULAR | Status: DC | PRN

## 2019-02-08 MED ORDER — ADULT MULTIVITAMIN W/MINERALS CH
1.0000 | ORAL_TABLET | Freq: Every day | ORAL | Status: DC
  Administered 2019-02-08 – 2019-02-13 (×5): 1 via ORAL
  Filled 2019-02-08 (×6): qty 1

## 2019-02-08 MED ORDER — SODIUM CHLORIDE 0.9 % IV BOLUS
500.0000 mL | Freq: Once | INTRAVENOUS | Status: AC
  Administered 2019-02-09: 500 mL via INTRAVENOUS

## 2019-02-08 MED ORDER — METHYLPREDNISOLONE ACETATE 80 MG/ML IJ SUSP
80.0000 mg | Freq: Once | INTRAMUSCULAR | Status: DC
  Filled 2019-02-08: qty 1

## 2019-02-08 NOTE — Consult Note (Signed)
Reason for Consult:Right knee pain and effusion Referring Physician: Hospitalist Service  HPI: Jocelyn Mccann is an 86 y.o. female we were consulted to see for right knee pain and swelling.  She has had ongoing complaints of bilateral knee pain and has had x-rays taken over the past few days.  She was originally admitted after she had had some mechanical falls and altered mental status and has had an extended hospital stay for sepsis and a urinary tract infection.  She required intubation and was treated in the ICU for some time but was ultimately transferred out under the care of the hospitalist service.  She has had ongoing complaints of knee pain.  She is very pleasantly confused female and is unable to provide me any history.  Past Medical History:  Diagnosis Date  . Bilateral knee pain   . Breast cancer ([place])    diagnosed 11/2017  . Dementia ([place])   . Osteoporosis   . Seizures ([place]) 02/06/2019  . Urge incontinence of urine     Past Surgical History:  Procedure Laterality Date  . BREAST LUMPECTOMY Right 2001  . BREAST LUMPECTOMY Right 12/2017  . BREAST LUMPECTOMY WITH SENTINEL LYMPH NODE BIOPSY Right 2001  . BREAST LUMPECTOMY WITH SENTINEL LYMPH NODE BIOPSY Right 12/30/2017   Procedure: RIGHT BREAST LUMPECTOMY WITH SENTINEL LYMPH NODE BIOPSY ERAS PATHWAY;  Surgeon: Stark Klein, MD;  Location: [facility];  Service: General;  Laterality: Right;  . DENTAL SURGERY      Family History  Problem Relation Age of Onset  . Cancer Mother        breast    Social History:  reports that she has never smoked. She has never used smokeless tobacco. She reports that she does not drink alcohol or use drugs.  Allergies:  Allergies  Allergen Reactions  . Milk-Related Compounds Diarrhea  . Pork-Derived Products Other (See Comments)    PATIENT DOES NOT EAT PORK    Medications: I have reviewed the patient's current medications.  Results for orders placed or performed during the hospital  encounter of 02/01/19 (from the past 48 hour(s))  Glucose, capillary     Status: None   Collection Time: 02/06/19  8:35 PM  Result Value Ref Range   Glucose-Capillary 97 70 - 99 mg/dL  Glucose, capillary     Status: None   Collection Time: 02/07/19 12:43 AM  Result Value Ref Range   Glucose-Capillary 99 70 - 99 mg/dL  CBC with Differential/Platelet     Status: Abnormal   Collection Time: 02/07/19  2:27 AM  Result Value Ref Range   WBC 3.7 (L) 4.0 - 10.5 K/uL   RBC 2.53 (L) 3.87 - 5.11 MIL/uL   Hemoglobin 8.4 (L) 12.0 - 15.0 g/dL   HCT 25.4 (L) 36.0 - 46.0 %   MCV 100.4 (H) 80.0 - 100.0 fL   MCH 33.2 26.0 - 34.0 pg   MCHC 33.1 30.0 - 36.0 g/dL   RDW 16.7 (H) 11.5 - 15.5 %   Platelets 81 (L) 150 - 400 K/uL    Comment: REPEATED TO VERIFY Immature Platelet Fraction may be clinically indicated, consider ordering this additional test [id number] CONSISTENT WITH PREVIOUS RESULT    nRBC 0.8 (H) 0.0 - 0.2 %   Neutrophils Relative % 62 %   Neutro Abs 2.3 1.7 - 7.7 K/uL   Lymphocytes Relative 12 %   Lymphs Abs 0.4 (L) 0.7 - 4.0 K/uL   Monocytes Relative 24 %   Monocytes Absolute 0.9  0.1 - 1.0 K/uL   Eosinophils Relative 1 %   Eosinophils Absolute 0.0 0.0 - 0.5 K/uL   Basophils Relative 0 %   Basophils Absolute 0.0 0.0 - 0.1 K/uL   Immature Granulocytes 1 %   Abs Immature Granulocytes 0.02 0.00 - 0.07 K/uL    Comment: Performed at [facility] [address], [place], [place] [id number]  Basic metabolic panel     Status: Abnormal   Collection Time: 02/07/19  2:27 AM  Result Value Ref Range   Sodium 137 135 - 145 mmol/L   Potassium 3.9 3.5 - 5.1 mmol/L   Chloride 107 98 - 111 mmol/L   CO2 24 22 - 32 mmol/L   Glucose, Bld 105 (H) 70 - 99 mg/dL   BUN 22 8 - 23 mg/dL   Creatinine, Ser 0.63 0.44 - 1.00 mg/dL   Calcium 9.4 8.9 - 10.3 mg/dL   GFR calc non Af Amer >60 >60 mL/min   GFR calc Af Amer >60 >60 mL/min   Anion gap 6 5 - 15    Comment: Performed at [facility], [place] [address], [place], [place] [address]  Glucose, capillary     Status: Abnormal   Collection Time: 02/07/19  4:55 AM  Result Value Ref Range   Glucose-Capillary 103 (H) 70 - 99 mg/dL  Glucose, capillary     Status: Abnormal   Collection Time: 02/07/19  8:14 AM  Result Value Ref Range   Glucose-Capillary 102 (H) 70 - 99 mg/dL  Glucose, capillary     Status: Abnormal   Collection Time: 02/07/19 11:21 AM  Result Value Ref Range   Glucose-Capillary 124 (H) 70 - 99 mg/dL  Glucose, capillary     Status: Abnormal   Collection Time: 02/07/19  4:36 PM  Result Value Ref Range   Glucose-Capillary 115 (H) 70 - 99 mg/dL  Glucose, capillary     Status: None   Collection Time: 02/07/19  8:24 PM  Result Value Ref Range   Glucose-Capillary 89 70 - 99 mg/dL  Glucose, capillary     Status: None   Collection Time: 02/08/19 12:12 AM  Result Value Ref Range   Glucose-Capillary 97 70 - 99 mg/dL  Glucose, capillary     Status: Abnormal   Collection Time: 02/08/19  4:33 AM  Result Value Ref Range   Glucose-Capillary 104 (H) 70 - 99 mg/dL  Glucose, capillary     Status: None   Collection Time: 02/08/19  7:23 AM  Result Value Ref Range   Glucose-Capillary 96 70 - 99 mg/dL  Basic metabolic panel     Status: Abnormal   Collection Time: 02/08/19  8:14 AM  Result Value Ref Range   Sodium 138 135 - 145 mmol/L   Potassium 4.3 3.5 - 5.1 mmol/L   Chloride 107 98 - 111 mmol/L   CO2 26 22 - 32 mmol/L   Glucose, Bld 111 (H) 70 - 99 mg/dL   BUN 19 8 - 23 mg/dL   Creatinine, Ser 0.74 0.44 - 1.00 mg/dL   Calcium 9.4 8.9 - 10.3 mg/dL   GFR calc non Af Amer >60 >60 mL/min   GFR calc Af Amer >60 >60 mL/min   Anion gap 5 5 - 15    Comment: Performed at [facility], [place] [address], [place], [place] [address]  CBC with Differential/Platelet     Status: Abnormal   Collection Time: 02/08/19  8:14 AM  Result Value  Ref Range   WBC 4.2 4.0 - 10.5 K/uL   RBC 2.59 (L) 3.87 - 5.11 MIL/uL   Hemoglobin 8.6  (L) 12.0 - 15.0 g/dL   HCT 26.1 (L) 36.0 - 46.0 %   MCV 100.8 (H) 80.0 - 100.0 fL   MCH 33.2 26.0 - 34.0 pg   MCHC 33.0 30.0 - 36.0 g/dL   RDW 16.9 (H) 11.5 - 15.5 %   Platelets 72 (L) 150 - 400 K/uL    Comment: REPEATED TO VERIFY PLATELET COUNT CONFIRMED BY SMEAR CONSISTENT WITH PREVIOUS RESULT    nRBC 0.5 (H) 0.0 - 0.2 %   Neutrophils Relative % 65 %   Neutro Abs 2.7 1.7 - 7.7 K/uL   Lymphocytes Relative 12 %   Lymphs Abs 0.5 (L) 0.7 - 4.0 K/uL   Monocytes Relative 22 %   Monocytes Absolute 1.0 0.1 - 1.0 K/uL   Eosinophils Relative 1 %   Eosinophils Absolute 0.0 0.0 - 0.5 K/uL   Basophils Relative 0 %   Basophils Absolute 0.0 0.0 - 0.1 K/uL   Immature Granulocytes 0 %   Abs Immature Granulocytes 0.01 0.00 - 0.07 K/uL    Comment: Performed at [facility], [address], [place], [place] [address]  Glucose, capillary     Status: None   Collection Time: 02/08/19 11:16 AM  Result Value Ref Range   Glucose-Capillary 88 70 - 99 mg/dL  Glucose, capillary     Status: Abnormal   Collection Time: 02/08/19  4:03 PM  Result Value Ref Range   Glucose-Capillary 110 (H) 70 - 99 mg/dL    DG Knee 1-2 Views Right  Result Date: 02/07/2019 CLINICAL DATA:  Recurrent falls over the past 2 weeks. Right knee pain, swelling and tenderness. Initial encounter. EXAM: RIGHT KNEE - 1-2 VIEW COMPARISON:  Plain films right knee 01/26/2019. FINDINGS: No fracture or dislocation is identified. The patient has a large joint effusion which is new since the prior examination. Degenerative change about the knee is severe in the lateral compartment. No chondrocalcinosis. IMPRESSION: Large knee joint effusion is new since the prior examination. Negative for fracture. Advanced osteoarthritis is worst in the lateral compartment. Electronically Signed   By: Inge Rise M.D.   On: 02/07/2019 12:29      Physical Exam:  Vitals Temp:  [98.2 F (36.8 C)-98.3 F (36.8 C)] 98.2 F (36.8 C) (01/28  1620) Pulse Rate:  [60-82] 82 (01/28 1620) Resp:  [16-18] 18 (01/28 1620) BP: (125-160)/(68-96) 125/68 (01/28 1620) SpO2:  [98 %-100 %] 100 % (01/28 1620) Weight:  [82.7 kg] 82.7 kg (01/27 2300) Body mass index is 33.35 kg/m.  Ms. Lingad is very conversive however unable to provide any history.  She is alert but not oriented.  Examination of both lower extremities reveals a very painful range of motion.  She is noted to have valgus deformities of bilateral knees.  She is diffusely tender about bilateral joint lines and on the right knee exam she is noted to have a large effusion.  There is no erythema no redness and no warmth.  Radiographs multiple radiographs of bilateral knees are available for review.  The left knee from February 04, 2019 and right knee films from February 07, 2019.  These showed no evidence of acute fractures.  These are nonweightbearing films but she is noted to have advanced degenerative tricompartmental arthritis.  Given the the right knee effusion and her significant pain we have requested an aspiration tray as  well as a dose of Depo-Medrol.  At the bedside I anesthetized the lateral approach after sterile cleaning with 1% Xylocaine.  Following this approximately 55 cc of yellow and mildly cloudy but nonpurulent aspirate was removed.  A combination of 80 mg of Depo-Medrol along with 10 cc of Xylocaine was instilled into the right knee.  She tolerated this well.  The aspirate was sent to the lab for cultures and cell count.   Assessment/Plan: Impression: Bilateral advanced knee tricompartmental arthritis with recent exacerbation following her falls  Treatment: I am hopeful that she will get some significant relief following the injection into her right knee however she does have quite advanced arthritis.  I would recommend ongoing conservative care to include ice packs and I did send her aspirate to the lab however this did not look to be an infectious source but more of a  traumatic exacerbation of her underlying arthritis.  If she gets significant benefit from the local injection of the corticosteroid in her right knee then certainly she might benefit from an injection in the left knee as well.  It is hard given her overall dementia to get an adequate history and assessment.  Continue with general medical care via the hospitalist service.  Olivia Mackie Cosima Prentiss for Dr. Justice Britain 02/08/2019, 7:34 PM  Contact # [phone]

## 2019-02-08 NOTE — Progress Notes (Signed)
Nutrition Follow-up   DOCUMENTATION CODES:   Not applicable  INTERVENTION:   If intake remains poor recommend placement of Cortrak and restarting EN.    Ensure Enlive po TID, each supplement provides 350 kcal and 20 grams of protein  MVI daily   NUTRITION DIAGNOSIS:   Inadequate oral intake related to inability to eat as evidenced by NPO status.  Ongoing   GOAL:   Patient will meet greater than or equal to 90% of their needs   Met with TF  MONITOR:   Vent status, TF tolerance, Labs, I & O's  ASSESSMENT:   86 yo female admitted with AMS and hypothermia S/P fall; had hypoglycemia (glucose 23) and a seizure and required intubation. PMH includes breast cancer, Alzheimer's dementia, seizure D/O, recent falls.   1/24- extubated  1/26- diet advanced to DYS 1/T  NG reported to be pulled out by patient on 1/26. Mentation remains a barrier to further diet advancement. Per RN, pt consumed 1/2 cup of broth, 1/2 cup of fruit milk, and some milk. Last three meal completions charted as 0-25% (10% average). RD to provide supplementation. If intake remains poor recommend placement of Cortrak and restarting of EN.   Admission weight: 61 kg  Current weight: 82.7 kg   I/O: +8,947 since admit   Drips: D5 in 1/2NS @ 50 ml/hr  Labs: CBG 88-124  Diet Order:   Diet Order            DIET - DYS 1 Room service appropriate? Yes with Assist; Fluid consistency: Thin  Diet effective now              EDUCATION NEEDS:   No education needs have been identified at this time  Skin:  Skin Assessment: Skin Integrity Issues: Skin Integrity Issues:: Stage I, Other (Comment) Stage I: R heel Other: L eye laceration  Last BM:  PTA  Height:   Ht Readings from Last 1 Encounters:  02/02/19 '5\' 2"'$  (1.575 m)    Weight:   Wt Readings from Last 1 Encounters:  02/07/19 82.7 kg    Ideal Body Weight:  50 kg  BMI:  Body mass index is 33.35 kg/m.  Estimated Nutritional Needs:   Kcal:   1500-1700  Protein:  80-95 gm  Fluid:  >/= 1.5 L  Mariana Single RD, LDN Clinical Nutrition Pager # [phone]

## 2019-02-08 NOTE — Progress Notes (Addendum)
PROGRESS NOTE    Jocelyn Mccann  [id number] DOB: 09-30-1928 DOA: 02/01/2019 PCP: Alycia Rossetti, MD     Brief Narrative:  Patient recently turned 86 years old with a past medical history of breast cancer and dementia.  She presented on 02/01/2019 after a mechanical fall and altered mental status.  She been having frequent falls.  Initial imaging was unremarkable.  She was found to have a urinary tract infection and started on Rocephin, culture positive for Klebsiella.  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  MRI also showed a small volume of a chronic subarachnoid hemorrhage along the right parietal lobe. Neurology recommended seizure prophylaxis with Keppra.  Patient required intubation for airway protection.  Hypothermia was corrected, her glucose dropped to 23 and she developed seizure activity.  Wean from the vent on 1/24.  She is transferred out of the unit to the hospitalist service on 1/26.  New events last 24 hours / Subjective: Unremarkable past 24 hours. Pt somewhat confused during my visit with her, but no complaints. No fam bedside.  Waiting on SNF placement.  Assessment & Plan:   Principal Problem:   Sepsis secondary to UTI [facility])  Resolved  Completed Rocephin   Active Problems:   Left knee pain/effusion  XR shows OA  Spoke w daughter, discussed if not going today, will consult ortho for possible aspiration of knee  Has seen [facility] outpt, spoke with ortho PA, Dr. Susie Cassette team will see the patient    Alzheimer disease ([place])   Aricept 5 mg nightly  Seroquel 25 mg nightly  Zoloft 25 mg daily    SDH (subdural hematoma) (HCC)  Stable    Acute encephalopathy  Closer to baseline    Hypoglycemia without diagnosis of diabetes mellitus   Sugars controlled    Seizures (HCC)  Keppra 500 mg twice daily    Pancytopenia (HCC)  Monitor CBC, improved today    Pressure injury of skin  As  below   In agreement with assessment of the pressure ulcer as below:  Pressure Injury 02/05/19 Heel Right;Medial Stage 1 -  Intact skin with non-blanchable redness of a localized area usually over a bony prominence. (Active)  02/05/19 1200  Location: Heel  Location Orientation: Right;Medial  Staging: Stage 1 -  Intact skin with non-blanchable redness of a localized area usually over a bony prominence.  Wound Description (Comments):   Present on Admission: No     Nutrition Problem: Inadequate oral intake Etiology: inability to eat   DVT prophylaxis: SCDs Code Status: Full Family Communication: Daughter, Spoke with [place] Disposition Plan: SNF, awaiting placement; pt is coming from home; barriers to discharge include bed availability   Consultants:   CCM  Ortho  Neuro  Palliative Care  Procedures:   EEG  Sutures  EEG  Antimicrobials:  . Anti-infectives (From admission, onward)    Start     Dose/Rate Route Frequency Ordered Stop   02/04/19 1100  cefTRIAXone (ROCEPHIN) 1 g in sodium chloride 0.9 % 100 mL IVPB     1 g 200 mL/hr over 30 Minutes Intravenous Every 24 hours 02/04/19 0950 02/06/19 1142   02/02/19 1800  vancomycin (VANCOCIN) IVPB 1000 mg/200 mL premix  Status:  Discontinued     1,000 mg 200 mL/hr over 60 Minutes Intravenous Every 24 hours 02/01/19 1833 02/03/19 1018   02/02/19 0600  ceFEPIme (MAXIPIME) 2 g in sodium chloride 0.9 % 100 mL IVPB  Status:  Discontinued     2 g 200 mL/hr over 30 Minutes Intravenous Every 12 hours 02/01/19 1718 02/04/19 0950   02/01/19 1700  ceFEPIme (MAXIPIME) 2 g in sodium chloride 0.9 % 100 mL IVPB     2 g 200 mL/hr over 30 Minutes Intravenous  Once 02/01/19 1648 02/01/19 1801   02/01/19 1645  vancomycin (VANCOCIN) IVPB 1000 mg/200 mL premix     1,000 mg 200 mL/hr over 60 Minutes Intravenous  Once 02/01/19 1644 02/01/19 1903   02/01/19 1445  cefTRIAXone (ROCEPHIN) 1 g in sodium chloride 0.9 % 100 mL IVPB     1  g 200 mL/hr over 30 Minutes Intravenous  Once 02/01/19 1443 02/01/19 1533         Objective: Vitals:   02/07/19 1636 02/07/19 2022 02/07/19 2300 02/08/19 0433  BP: 116/80 (!) 160/96  129/77  Pulse: (!) 56 69  60  Resp:  16  16  Temp:  98.3 F (36.8 C)  98.2 F (36.8 C)  TempSrc:      SpO2: 100% 98%    Weight:   82.7 kg   Height:        Intake/Output Summary (Last 24 hours) at 02/08/2019 1029 Last data filed at 02/08/2019 0200 Gross per 24 hour  Intake 60 ml  Output 0 ml  Net 60 ml   Filed Weights   02/06/19 0500 02/07/19 0433 02/07/19 2300  Weight: 72.6 kg 75.9 kg 82.7 kg    Examination:  General exam: Appears calm and comfortable  Respiratory system: Clear to auscultation. Respiratory effort normal. No respiratory distress. No conversational dyspnea.  Cardiovascular system: S1 & S2 heard, RRR. No murmurs. No pedal edema. Gastrointestinal system: Abdomen is nondistended, soft and nontender. Normal bowel sounds heard. Central nervous system: Alert and oriented to person only; does not answer all questions intelligbly Extremities: Symmetric in appearance  Skin: No rashes, lesions or ulcers on exposed skin  Psychiatry: Judgement and insight appear limited. Mood & affect appropriate.   Data Reviewed: I have personally reviewed following labs and imaging studies  CBC: Recent Labs  Lab 02/01/19 1450 02/01/19 2329 02/03/19 0312 02/04/19 0230 02/05/19 0217 02/05/19 1255 02/07/19 0227  WBC 1.1*   < > 4.1 3.9* 3.6* 3.9* 3.7*  NEUTROABS 0.4*  --  3.2 3.1  --  2.5 2.3  HGB 10.4*   < > 9.3* 8.7* 8.2* 9.0* 8.4*  HCT 31.6*   < > 28.3* 26.0* 25.1* 27.9* 25.4*  MCV 99.7   < > 99.0 98.9 100.4* 102.2* 100.4*  PLT 102*   < > 81* 59* 58* 59* 81*   < > = values in this interval not displayed.   Basic Metabolic Panel: Recent Labs  Lab 02/01/19 2346 02/02/19 0302 02/03/19 [id number] 02/03/19 [id number] 02/04/19 0230 02/05/19 0217 02/06/19 0559 02/07/19 0227 02/08/19 0814  NA    < >  --  142   < > 140 137 141 137 138  K   < >  --  4.3   < > 3.8 4.0 4.3 3.9 4.3  CL   < >  --  112*   < > 112* 108 111 107 107  CO2   < >  --  24   < > 22 22 21* 24 26  GLUCOSE   < >  --  119*   < > 103* 110* 115* 105* 111*  BUN   < >  --  29*   < > 35* 33*  31* 22 19  CREATININE   < >  --  1.05*   < > 0.80 0.83 0.82 0.63 0.74  CALCIUM   < >  --  8.6*   < > 8.6* 8.6* 9.3 9.4 9.4  MG  --  1.6* 2.1  --   --  1.9  --   --   --   PHOS  --  2.6  --   --   --   --   --   --   --    < > = values in this interval not displayed.   GFR: Estimated Creatinine Clearance: 47.5 mL/min (by C-G formula based on SCr of 0.74 mg/dL). Liver Function Tests: Recent Labs  Lab 02/01/19 2346 02/03/19 0312 02/04/19 0230 02/05/19 0217 02/06/19 0559  AST 180* 143* 111* 87* 191*  ALT 171* 148* 129* 111* 184*  ALKPHOS 123 117 106 96 121  BILITOT 0.6 0.6 0.9 0.4 0.8  PROT 5.5* 5.2* 4.9* 4.8* 5.7*  ALBUMIN 2.8* 2.2* 2.1* 2.0* 2.3*   Recent Labs  Lab 02/01/19 2348  AMMONIA 28   Cardiac Enzymes: Recent Labs  Lab 02/01/19 1450 02/01/19 2346  CKTOTAL 132 111   CBG: Recent Labs  Lab 02/07/19 1636 02/07/19 2024 02/08/19 0012 02/08/19 0433 02/08/19 0723  GLUCAP 115* 89 97 104* 96   Sepsis Labs: Recent Labs  Lab 02/01/19 1433 02/01/19 2346 02/02/19 0302  LATICACIDVEN 1.4 1.9 1.1    Recent Results (from the past 240 hour(s))  Blood Culture (routine x 2)     Status: None   Collection Time: 02/01/19  2:31 PM   Specimen: BLOOD  Result Value Ref Range Status   Specimen Description BLOOD RIGHT ANTECUBITAL  Final   Special Requests   Final    BOTTLES DRAWN AEROBIC AND ANAEROBIC Blood Culture adequate volume   Culture   Final    NO GROWTH 5 DAYS Performed at [facility], [place] [address] [place], [place] [address]    Report Status 02/06/2019 FINAL  Final  Blood Culture (routine x 2)     Status: None   Collection Time: 02/01/19  2:40 PM   Specimen: BLOOD  Result Value Ref Range  Status   Specimen Description BLOOD BLOOD LEFT FOREARM  Final   Special Requests   Final    BOTTLES DRAWN AEROBIC AND ANAEROBIC Blood Culture adequate volume   Culture   Final    NO GROWTH 5 DAYS Performed at [facility], [place] [address] [place], [place] [address]    Report Status 02/06/2019 FINAL  Final  Respiratory Panel by RT PCR (Flu A&B, Covid) - Nasopharyngeal Swab     Status: None   Collection Time: 02/01/19  3:21 PM   Specimen: Nasopharyngeal Swab  Result Value Ref Range Status   SARS Coronavirus 2 by RT PCR NEGATIVE NEGATIVE Final    Comment: (NOTE) SARS-CoV-2 target nucleic acids are NOT DETECTED. The SARS-CoV-2 RNA is generally detectable in upper respiratoy specimens during the acute phase of infection. The lowest concentration of SARS-CoV-2 viral copies this assay can detect is 131 copies/mL. A negative result does not preclude SARS-Cov-2 infection and should not be used as the sole basis for treatment or other patient management decisions. A negative result may occur with  improper specimen collection/handling, submission of specimen other than nasopharyngeal swab, presence of viral mutation(s) within the areas targeted by this assay, and inadequate number of viral copies (<131 copies/mL). A negative result must  be combined with clinical observations, patient history, and epidemiological information. The expected result is Negative. Fact Sheet for Patients:  PinkCheek.be Fact Sheet for Healthcare Providers:  GravelBags.it This test is not yet ap proved or cleared by the [place] FDA and  has been authorized for detection and/or diagnosis of SARS-CoV-2 by FDA under an Emergency Use Authorization (EUA). This EUA will remain  in effect (meaning this test can be used) for the duration of the COVID-19 declaration under Section 564(b)(1) of the Act, 21 U.S.C. section 360bbb-3(b)(1), unless the  authorization is terminated or revoked sooner.    Influenza A by PCR NEGATIVE NEGATIVE Final   Influenza B by PCR NEGATIVE NEGATIVE Final    Comment: (NOTE) The Xpert Xpress SARS-CoV-2/FLU/RSV assay is intended as an aid in  the diagnosis of influenza from Nasopharyngeal swab specimens and  should not be used as a sole basis for treatment. Nasal washings and  aspirates are unacceptable for Xpert Xpress SARS-CoV-2/FLU/RSV  testing. Fact Sheet for Patients: PinkCheek.be Fact Sheet for Healthcare Providers: GravelBags.it This test is not yet approved or cleared by the [place] FDA and  has been authorized for detection and/or diagnosis of SARS-CoV-2 by  FDA under an Emergency Use Authorization (EUA). This EUA will remain  in effect (meaning this test can be used) for the duration of the  Covid-19 declaration under Section 564(b)(1) of the Act, 21  U.S.C. section 360bbb-3(b)(1), unless the authorization is  terminated or revoked. Performed at [facility], [place] [address] [place], [place] [address]   Urine culture     Status: Abnormal   Collection Time: 02/01/19  4:49 PM   Specimen: In/Out Cath Urine  Result Value Ref Range Status   Specimen Description IN/OUT CATH URINE  Final   Special Requests   Final    NONE Performed at [facility], [place] [address] [place], [place] [address]    Culture 50,000 COLONIES/mL KLEBSIELLA PNEUMONIAE (A)  Final   Report Status 02/04/2019 FINAL  Final   Organism ID, Bacteria KLEBSIELLA PNEUMONIAE (A)  Final      Susceptibility   Klebsiella pneumoniae - MIC*    AMPICILLIN >=32 RESISTANT Resistant     CEFAZOLIN <=4 SENSITIVE Sensitive     CEFTRIAXONE <=0.25 SENSITIVE Sensitive     CIPROFLOXACIN <=0.25 SENSITIVE Sensitive     GENTAMICIN <=1 SENSITIVE Sensitive     IMIPENEM <=0.25 SENSITIVE Sensitive     NITROFURANTOIN 128 RESISTANT Resistant     TRIMETH/SULFA <=20 SENSITIVE  Sensitive     AMPICILLIN/SULBACTAM 8 SENSITIVE Sensitive     PIP/TAZO <=4 SENSITIVE Sensitive     * 50,000 COLONIES/mL KLEBSIELLA PNEUMONIAE  MRSA PCR Screening     Status: None   Collection Time: 02/02/19  2:40 AM   Specimen: Nasal Mucosa; Nasopharyngeal  Result Value Ref Range Status   MRSA by PCR NEGATIVE NEGATIVE Final    Comment:        The GeneXpert MRSA Assay (FDA approved for NASAL specimens only), is one component of a comprehensive MRSA colonization surveillance program. It is not intended to diagnose MRSA infection nor to guide or monitor treatment for MRSA infections. Performed at [facility], [place] [address] [place], [place] [address]     Radiology Studies: DG Knee 1-2 Views Right  Result Date: 02/07/2019 CLINICAL DATA:  Recurrent falls over the past 2 weeks. Right knee pain, swelling and tenderness. Initial encounter. EXAM: RIGHT KNEE - 1-2 VIEW COMPARISON:  Plain  films right knee 01/26/2019. FINDINGS: No fracture or dislocation is identified. The patient has a large joint effusion which is new since the prior examination. Degenerative change about the knee is severe in the lateral compartment. No chondrocalcinosis. IMPRESSION: Large knee joint effusion is new since the prior examination. Negative for fracture. Advanced osteoarthritis is worst in the lateral compartment. Electronically Signed   By: Inge Rise M.D.   On: 02/07/2019 12:29    Scheduled Meds: . chlorhexidine  15 mL Mouth Rinse BID  . Chlorhexidine Gluconate Cloth  6 each Topical Q0600  . donepezil  5 mg Oral QHS  . levETIRAcetam  500 mg Oral BID  . mouth rinse  15 mL Mouth Rinse q12n4p  . QUEtiapine  25 mg Oral QHS  . sertraline  25 mg Oral Daily   Continuous Infusions: . dextrose 5 % and 0.45% NaCl 50 mL/hr at 02/08/19 0008     LOS: 7 days      Time spent: 30 minutes   Shelda Pal, DO [facility] 02/08/2019, 10:29 AM   Available via Epic secure chat  7am-7pm After these hours, please refer to coverage provider listed on [email or web address]

## 2019-02-08 NOTE — Progress Notes (Addendum)
RN paged NP, unable to get oral or axillary temp.  Rectal temp 91.37F with 4 different thermometers.  Warm blankets and hot packs applied. Rapid response not available.  Stat CBC, BMP, and lactic acid ordered.  Transfer to progressive for bair hugger and warm IV fluids.  BP 133/70, HR 62, BS 104, O2 98%.  Recheck of rectal temp was 18F.  Temps WNL thoughout the day.  She was admitted 7 days ago with fall and AMS.  UTI found and treated with Rocephin.  MRI showed microhemorrhages, neuro recommended seizure prophylaxis.  She was intubated after admission and stabilized, hypothermia corrected in ICU at that time.  Transferred from ICU on 02/06/19.    Addendum: Pt transferred to progressive, received bair hugger, temp 98.3 at 04:45, lactic acid 1.2 with repeat also 1.2.

## 2019-02-08 NOTE — Progress Notes (Signed)
Unable to obtain oral nor axillary temperature on patient,attempted many times with 4 different dinamaps.Rectally,temp is 91.3 deg fahrenheit.Warm blankets applied on patient.MD on call text paged. Traci Gafford, Wonda Cheng, Therapist, sports

## 2019-02-09 ENCOUNTER — Inpatient Hospital Stay (HOSPITAL_COMMUNITY): Payer: Medicare HMO

## 2019-02-09 ENCOUNTER — Inpatient Hospital Stay: Payer: Medicare HMO | Admitting: Family Medicine

## 2019-02-09 DIAGNOSIS — J189 Pneumonia, unspecified organism: Secondary | ICD-10-CM | POA: Diagnosis not present

## 2019-02-09 DIAGNOSIS — A419 Sepsis, unspecified organism: Secondary | ICD-10-CM | POA: Diagnosis not present

## 2019-02-09 LAB — CBC
HCT: 24.3 % — ABNORMAL LOW (ref 36.0–46.0)
HCT: 24.6 % — ABNORMAL LOW (ref 36.0–46.0)
Hemoglobin: 8.1 g/dL — ABNORMAL LOW (ref 12.0–15.0)
Hemoglobin: 8.2 g/dL — ABNORMAL LOW (ref 12.0–15.0)
MCH: 32.7 pg (ref 26.0–34.0)
MCH: 33.3 pg (ref 26.0–34.0)
MCHC: 33.3 g/dL (ref 30.0–36.0)
MCHC: 33.3 g/dL (ref 30.0–36.0)
MCV: 100 fL (ref 80.0–100.0)
MCV: 98 fL (ref 80.0–100.0)
Platelets: 104 10*3/uL — ABNORMAL LOW (ref 150–400)
Platelets: 78 10*3/uL — ABNORMAL LOW (ref 150–400)
RBC: 2.46 MIL/uL — ABNORMAL LOW (ref 3.87–5.11)
RBC: 2.48 MIL/uL — ABNORMAL LOW (ref 3.87–5.11)
RDW: 16.5 % — ABNORMAL HIGH (ref 11.5–15.5)
RDW: 16.8 % — ABNORMAL HIGH (ref 11.5–15.5)
WBC: 2.7 10*3/uL — ABNORMAL LOW (ref 4.0–10.5)
WBC: 3.3 10*3/uL — ABNORMAL LOW (ref 4.0–10.5)
nRBC: 0 % (ref 0.0–0.2)
nRBC: 0.7 % — ABNORMAL HIGH (ref 0.0–0.2)

## 2019-02-09 LAB — BASIC METABOLIC PANEL
Anion gap: 7 (ref 5–15)
Anion gap: 8 (ref 5–15)
BUN: 21 mg/dL (ref 8–23)
BUN: 22 mg/dL (ref 8–23)
CO2: 21 mmol/L — ABNORMAL LOW (ref 22–32)
CO2: 22 mmol/L (ref 22–32)
Calcium: 8.9 mg/dL (ref 8.9–10.3)
Calcium: 9.4 mg/dL (ref 8.9–10.3)
Chloride: 106 mmol/L (ref 98–111)
Chloride: 107 mmol/L (ref 98–111)
Creatinine, Ser: 0.62 mg/dL (ref 0.44–1.00)
Creatinine, Ser: 0.72 mg/dL (ref 0.44–1.00)
GFR calc Af Amer: >60 mL/min (ref >60)
GFR calc Af Amer: >60 mL/min (ref >60)
GFR calc non Af Amer: >60 mL/min (ref >60)
GFR calc non Af Amer: >60 mL/min (ref >60)
Glucose, Bld: 112 mg/dL — ABNORMAL HIGH (ref 70–99)
Glucose, Bld: 113 mg/dL — ABNORMAL HIGH (ref 70–99)
Potassium: 4.1 mmol/L (ref 3.5–5.1)
Potassium: 4.2 mmol/L (ref 3.5–5.1)
Sodium: 135 mmol/L (ref 135–145)
Sodium: 136 mmol/L (ref 135–145)

## 2019-02-09 LAB — GLUCOSE, CAPILLARY
Glucose-Capillary: 97 mg/dL (ref 70–99)
Glucose-Capillary: 78 mg/dL (ref 70–99)
Glucose-Capillary: 88 mg/dL (ref 70–99)
Glucose-Capillary: 88 mg/dL (ref 70–99)
Glucose-Capillary: 114 mg/dL — ABNORMAL HIGH (ref 70–99)

## 2019-02-09 LAB — LACTIC ACID, PLASMA
Lactic Acid, Venous: 1.2 mmol/L (ref 0.5–1.9)
Lactic Acid, Venous: 1.2 mmol/L (ref 0.5–1.9)

## 2019-02-09 LAB — T4, FREE: Free T4: 1.55 ng/dL — ABNORMAL HIGH (ref 0.61–1.12)

## 2019-02-09 LAB — TSH: TSH: 2.742 u[IU]/mL (ref 0.350–4.500)

## 2019-02-09 MED ORDER — SODIUM CHLORIDE 0.9 % IV SOLN
2.0000 g | Freq: Two times a day (BID) | INTRAVENOUS | Status: DC
  Administered 2019-02-09 – 2019-02-11 (×6): 2 g via INTRAVENOUS
  Filled 2019-02-09 (×7): qty 2

## 2019-02-09 MED ORDER — LORAZEPAM 2 MG/ML IJ SOLN
1.0000 mg | Freq: Once | INTRAMUSCULAR | Status: AC
  Administered 2019-02-09: 1 mg via INTRAVENOUS
  Filled 2019-02-09: qty 1

## 2019-02-09 MED ORDER — VANCOMYCIN HCL 1500 MG/300ML IV SOLN
1500.0000 mg | Freq: Once | INTRAVENOUS | Status: AC
  Administered 2019-02-09: 1500 mg via INTRAVENOUS
  Filled 2019-02-09: qty 300

## 2019-02-09 MED ORDER — VANCOMYCIN HCL 750 MG/150ML IV SOLN
750.0000 mg | INTRAVENOUS | Status: DC
  Administered 2019-02-10 – 2019-02-11 (×2): 750 mg via INTRAVENOUS
  Filled 2019-02-09 (×3): qty 150

## 2019-02-09 MED ORDER — SODIUM CHLORIDE 0.9 % IV BOLUS: 500.0000 mL | Freq: Once | INTRAVENOUS | Status: DC

## 2019-02-09 MED ORDER — METOPROLOL TARTRATE 5 MG/5ML IV SOLN: 5.0000 mg | INTRAVENOUS | Status: DC | PRN

## 2019-02-09 MED ORDER — LEVETIRACETAM IN NACL 500 MG/100ML IV SOLN
500.0000 mg | Freq: Two times a day (BID) | INTRAVENOUS | Status: DC
  Administered 2019-02-09 – 2019-02-11 (×4): 500 mg via INTRAVENOUS
  Filled 2019-02-09 (×4): qty 100

## 2019-02-09 NOTE — TOC Progression Note (Addendum)
Transition of Care [facility]) - Progression Note    Patient Details  Name: KAAVYA SOVEY MRN: [id number] Date of Birth: December 12, 1928  Transition of Care [facility]) CM/SW [place], [place] Phone Number: 02/09/2019, 3:35 PM  Clinical Narrative:     Patient's daughter has accepted bed offer with [facility]. They will not have availability until Monday.   If patient appears to be medically stable on Sunday, please request covid test.  Thurmond Butts, MSW, LCSWA Clinical Social Worker   Expected Discharge Plan: Skilled Nursing Facility Barriers to Discharge: SNF Pending bed offer, Continued Medical Work up  Expected Discharge Plan and Services Expected Discharge Plan: [place] In-house Referral: Clinical Social Work                                             Social Determinants of Health (SDOH) Interventions    Readmission Risk Interventions No flowsheet data found.

## 2019-02-09 NOTE — Progress Notes (Signed)
Report given to [place]. Patient transferred to room 6E06. Patient has no belongings. [place] notified of transfer. Aysiah Jurado, Wonda Cheng, Therapist, sports

## 2019-02-09 NOTE — Progress Notes (Signed)
PT Cancellation Note  Patient Details Name: Jocelyn Mccann MRN: [id number] DOB: 1928-02-21   Cancelled Treatment:    Reason Eval/Treat Not Completed: Medical issues which prohibited therapy see OT note- per RN patient not appropriate for participation today. Will follow acutely.    Windell Norfolk, DPT, PN1   Supplemental Physical Therapist [facility]    Pager [phone] Acute Rehab Office [phone]

## 2019-02-09 NOTE — Progress Notes (Signed)
On RN assessment of patient, temp noted at 102.0 orally, BP 105/47, HR upper 90's. Patient arousable to pain, will not maintain eye contact. RN turned off bear hugger, administered PRN Tylenol rectally. Paged MD with updates, waiting for new orders. Will recheck vitals q 15 min and initiate red MEWS protocol. Will continue to monitor.

## 2019-02-09 NOTE — TOC Progression Note (Signed)
Transition of Care [facility]) - Progression Note    Patient Details  Name: Jocelyn Mccann MRN: [id number] Date of Birth: September 10, 1928  Transition of Care [facility]) CM/SW [place], [place] Phone Number: 02/09/2019, 11:20 AM  Clinical Narrative:     CSW informed patient's daughter,Pencilla of bed offers. She is willing to accept bed offer with [facility] if [facility] does not make an offer. CSW has contacted [facility] and is waiting on response.   CSW will continue to follow and assist with discharge planning.   Thurmond Butts, MSW, [place] Clinical Social Worker     Expected Discharge Plan: Skilled Nursing Facility Barriers to Discharge: SNF Pending bed offer, Continued Medical Work up  Expected Discharge Plan and Services Expected Discharge Plan: [place] In-house Referral: Clinical Social Work                                             Social Determinants of Health (SDOH) Interventions    Readmission Risk Interventions No flowsheet data found.

## 2019-02-09 NOTE — Progress Notes (Signed)
OT Cancellation Note  Patient Details Name: Jocelyn Mccann MRN: [id number] DOB: November 07, 1928   Cancelled Treatment:    Reason Eval/Treat Not Completed: Medical issues which prohibited therapy.  Spoke with Scientific laboratory technician.  Per RN will hold skilled OT today as pt. Is not medically appropriate for participation.  Pt. Continues with elevated temp. And not currently responsive to verbal interactions.  Will monitor and re attempt as pt. Able.    Daiva Huge [place]-COTA/L   02/09/2019, 11:18 AM

## 2019-02-09 NOTE — Progress Notes (Signed)
Pharmacy Antibiotic Note  Jocelyn Mccann is a 86 y.o. female admitted on 02/01/2019 with pneumonia.  Pharmacy has been consulted for vancomycin and cefepime dosing.  WBC down to 2.7, tmax 102.1, LA 1.2. Scr 0.62 (CrCl 48 mL/min). CXR showing possible multilobar bilateral PNA.   Plan: Vancomycin 1500 mg IV once then 750 mg IV every 24 hours (estAUC 508)  Cefepime 2g IV every 12 hours Monitor renal fx, cx results, clinical pic, and vanc levels as appropriate  Height: 5\' 2"  (157.5 cm) Weight: (pt bed scale innaccurate RN notified.) IBW/kg (Calculated) : 50.1  Temp (24hrs), Avg:97.8 F (36.6 C), Min:90.4 F (32.4 C), Max:102.1 F (38.9 C)  Recent Labs  Lab 02/05/19 0217 02/05/19 1255 02/06/19 0559 02/07/19 0227 02/08/19 0814 02/09/19 0004 02/09/19 0113 02/09/19 0409  WBC  --  3.9*  --  3.7* 4.2 3.3* 2.7*  --   CREATININE   < >  --  0.82 0.63 0.74 0.72 0.62  --   LATICACIDVEN  --   --   --   --   --   --  1.2 1.2   < > = values in this interval not displayed.    Estimated Creatinine Clearance: 47.5 mL/min (by C-G formula based on SCr of 0.62 mg/dL).    Allergies  Allergen Reactions  . Milk-Related Compounds Diarrhea  . Pork-Derived Products Other (See Comments)    PATIENT DOES NOT EAT PORK    Antimicrobials this admission: Cefepime 1/21 >> 1/24, 1/29>> Ceftriaxone 1/21, 1/24>>1/26 Vancomycin 1/21>> 1/22, 1/29>>  Dose adjustments this admission: N/A  Microbiology results: 1/29 BCx: NGTD 1/28 R knee synovial fluid: NGTD 1/21 BCx: NGTD 1/21 UCx: Kleb pneumoniae (R amp, macrobid)  1/22 MRSA PCR: neg  Thank you for allowing pharmacy to be a part of this patient's care.  Antonietta Jewel, PharmD, BCCCP Clinical Pharmacist  Phone: [phone]  Please check AMION for all [place] phone numbers After 10:00 PM, call [place] [phone] 02/09/2019 11:43 AM

## 2019-02-09 NOTE — Progress Notes (Signed)
PROGRESS NOTE    Jocelyn Mccann  [id number] DOB: 10/01/1928 DOA: 02/01/2019 PCP: Jocelyn Rossetti, MD     Brief Narrative:  Patient recently turned 86 years old with a past medical history of breast cancer and dementia.  She presented on 02/01/2019 after a mechanical fall and altered mental status.  She been having frequent falls.  Initial imaging was unremarkable.  She was found to have a urinary tract infection and started on Rocephin, culture positive for Klebsiella.  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  MRI also showed a small volume of a chronic subarachnoid hemorrhage along the right parietal lobe. Neurology recommended seizure prophylaxis with Keppra.  Patient required intubation for airway protection.  Hypothermia was corrected, her glucose dropped to 23 and she developed seizure activity.  Wean from the vent on 1/24.  She is transferred out of the unit to the hospitalist service on 1/26.   New events last 24 hours / Subjective: Last night the patient's temperature dropped to 91 F.  Stat labs ordered that were largely unremarkable.  Blood sugar within normal limits.  She was transferred to a different floor and blood cultures were drawn.  This morning she is not responsive to verbal stimuli.  She will arouse with painful stimuli.  No shortness of breath or hypoxemia.  Assessment & Plan:   Principal Problem:   Acute encephalopathy  Likely secondary to HCAP  CT showed new right basal ganglia infarct, spoke with neurology, Dr. Rory Mccann, who recommended MRI brain; we are waiting the results of that study  Active Problems:   Sepsis ([place])  Leukopenia and Fever  Blood cultures drawn  UA/cx; did finish 5 d of Rocephin for suspected Klebsiella UTI  Lactic acid 1.2    HCAP (healthcare-associated pneumonia)  Vancomycin and cefepime initiated  Currently on room air, supplemental oxygen as needed to maintain oxygen  saturations 90% or higher    Alzheimer disease ([place])  Hold home meds until she is more alert    SDH (subdural hematoma) ([place])  Resolved    Hypoglycemia without diagnosis of diabetes mellitus  Sugars currently controlled    Seizures ([facility])  Change Keppra 500 mg twice daily to IV     Pressure injury of skin    As below     In agreement with assessment of the pressure ulcer as below:  Pressure Injury 02/05/19 Heel Right;Medial Stage 1 -  Intact skin with non-blanchable redness of a localized area usually over a bony prominence. (Active)  02/05/19 1200  Location: Heel  Location Orientation: Right;Medial  Staging: Stage 1 -  Intact skin with non-blanchable redness of a localized area usually over a bony prominence.  Wound Description (Comments):   Present on Admission: No     Nutrition Problem: Inadequate oral intake Etiology: inability to eat   DVT prophylaxis: SCDs Code Status: Full Family Communication: Daughter, Spoke with Jocelyn Mccann today about her mother's status change Disposition Plan: SNF, awaiting placement; pt is coming from home; barriers to discharge include return of Sepsis and recent clinical worsening   Consultants:   CCM  Ortho  Neuro  Palliative Care  Procedures:   EEG  Sutures  Antimicrobials:  Anti-infectives (From admission, onward)   Start     Dose/Rate Route Frequency Ordered Stop   02/10/19 1300  vancomycin (VANCOREADY) IVPB 750 mg/150 mL     750 mg 150 mL/hr over 60 Minutes Intravenous Every 24 hours 02/09/19 1148  02/09/19 1200  vancomycin (VANCOREADY) IVPB 1500 mg/300 mL     1,500 mg 150 mL/hr over 120 Minutes Intravenous  Once 02/09/19 1148     02/09/19 1200  ceFEPIme (MAXIPIME) 2 g in sodium chloride 0.9 % 100 mL IVPB     2 g 200 mL/hr over 30 Minutes Intravenous Every 12 hours 02/09/19 1148     02/04/19 1100  cefTRIAXone (ROCEPHIN) 1 g in sodium chloride 0.9 % 100 mL IVPB     1 g 200 mL/hr over 30 Minutes Intravenous Every  24 hours 02/04/19 0950 02/06/19 1142   02/02/19 1800  vancomycin (VANCOCIN) IVPB 1000 mg/200 mL premix  Status:  Discontinued     1,000 mg 200 mL/hr over 60 Minutes Intravenous Every 24 hours 02/01/19 1833 02/03/19 1018   02/02/19 0600  ceFEPIme (MAXIPIME) 2 g in sodium chloride 0.9 % 100 mL IVPB  Status:  Discontinued     2 g 200 mL/hr over 30 Minutes Intravenous Every 12 hours 02/01/19 1718 02/04/19 0950   02/01/19 1700  ceFEPIme (MAXIPIME) 2 g in sodium chloride 0.9 % 100 mL IVPB     2 g 200 mL/hr over 30 Minutes Intravenous  Once 02/01/19 1648 02/01/19 1801   02/01/19 1645  vancomycin (VANCOCIN) IVPB 1000 mg/200 mL premix     1,000 mg 200 mL/hr over 60 Minutes Intravenous  Once 02/01/19 1644 02/01/19 1903   02/01/19 1445  cefTRIAXone (ROCEPHIN) 1 g in sodium chloride 0.9 % 100 mL IVPB     1 g 200 mL/hr over 30 Minutes Intravenous  Once 02/01/19 1443 02/01/19 1533       Objective: Vitals:   02/09/19 1052 02/09/19 1219 02/09/19 1315 02/09/19 1510  BP: (!) 117/57 113/67  136/69  Pulse: (!) 105 95 84 83  Resp: (!) 25 19 19 18   Temp: 98.6 F (37 C) 99.5 F (37.5 C) 99.5 F (37.5 C) 98.6 F (37 C)  TempSrc: Oral Axillary Axillary Axillary  SpO2: 92% 93% 96% 94%  Weight:      Height:        Intake/Output Summary (Last 24 hours) at 02/09/2019 1634 Last data filed at 02/08/2019 2202 Gross per 24 hour  Intake 300 ml  Output 200 ml  Net 100 ml   Filed Weights   02/06/19 0500 02/07/19 0433 02/07/19 2300  Weight: 72.6 kg 75.9 kg 82.7 kg    Examination:  General exam: Somnolent Respiratory system: Coarse bibasilar breath sounds, no accessory muscle use Cardiovascular system: S1 & S2 heard, tachycardic, regular rhythm. No pedal edema. Gastrointestinal system: Abdomen is nondistended, soft and nontender. Normal bowel sounds heard. Central nervous system: Will arouse to painful stimuli Extremities: Symmetric in appearance  Skin: No rashes, lesions or ulcers on exposed skin    Psychiatry: Unable to assess at this time  Data Reviewed: I have personally reviewed following labs and imaging studies  CBC: Recent Labs  Lab 02/03/19 0312 02/03/19 0312 02/04/19 0230 02/05/19 0217 02/05/19 1255 02/07/19 0227 02/08/19 0814 02/09/19 0004 02/09/19 0113  WBC 4.1   < > 3.9*   < > 3.9* 3.7* 4.2 3.3* 2.7*  NEUTROABS 3.2  --  3.1  --  2.5 2.3 2.7  --   --   HGB 9.3*   < > 8.7*   < > 9.0* 8.4* 8.6* 8.2* 8.1*  HCT 28.3*   < > 26.0*   < > 27.9* 25.4* 26.1* 24.6* 24.3*  MCV 99.0   < > 98.9   < >  102.2* 100.4* 100.8* 100.0 98.0  PLT 81*   < > 59*   < > 59* 81* 72* 104* 78*   < > = values in this interval not displayed.   Basic Metabolic Panel: Recent Labs  Lab 02/03/19 0312 02/04/19 0230 02/05/19 0217 02/05/19 0217 02/06/19 0559 02/07/19 0227 02/08/19 0814 02/09/19 0004 02/09/19 0113  NA 142   < > 137   < > 141 137 138 135 136  K 4.3   < > 4.0   < > 4.3 3.9 4.3 4.1 4.2  CL 112*   < > 108   < > 111 107 107 106 107  CO2 24   < > 22   < > 21* 24 26 22  21*  GLUCOSE 119*   < > 110*   < > 115* 105* 111* 112* 113*  BUN 29*   < > 33*   < > 31* 22 19 22 21   CREATININE 1.05*   < > 0.83   < > 0.82 0.63 0.74 0.72 0.62  CALCIUM 8.6*   < > 8.6*   < > 9.3 9.4 9.4 9.4 8.9  MG 2.1  --  1.9  --   --   --   --   --   --    < > = values in this interval not displayed.   GFR: Estimated Creatinine Clearance: 47.5 mL/min (by C-G formula based on SCr of 0.62 mg/dL). Liver Function Tests: Recent Labs  Lab 02/03/19 0312 02/04/19 0230 02/05/19 0217 02/06/19 0559  AST 143* 111* 87* 191*  ALT 148* 129* 111* 184*  ALKPHOS 117 106 96 121  BILITOT 0.6 0.9 0.4 0.8  PROT 5.2* 4.9* 4.8* 5.7*  ALBUMIN 2.2* 2.1* 2.0* 2.3*   CBG: Recent Labs  Lab 02/08/19 2008 02/08/19 2311 02/09/19 0355 02/09/19 0758 02/09/19 1101  GLUCAP 93 104* 97 78 88   Thyroid Function Tests: Recent Labs    02/09/19 0004  TSH 2.742  FREET4 1.55*   Sepsis Labs: Recent Labs  Lab 02/09/19 0113  02/09/19 0409  LATICACIDVEN 1.2 1.2    Recent Results (from the past 240 hour(s))  Blood Culture (routine x 2)     Status: None   Collection Time: 02/01/19  2:31 PM   Specimen: BLOOD  Result Value Ref Range Status   Specimen Description BLOOD RIGHT ANTECUBITAL  Final   Special Requests   Final    BOTTLES DRAWN AEROBIC AND ANAEROBIC Blood Culture adequate volume   Culture   Final    NO GROWTH 5 DAYS Performed at [facility], [place] [address], [place], [place] [address]    Report Status 02/06/2019 FINAL  Final  Blood Culture (routine x 2)     Status: None   Collection Time: 02/01/19  2:40 PM   Specimen: BLOOD  Result Value Ref Range Status   Specimen Description BLOOD BLOOD LEFT FOREARM  Final   Special Requests   Final    BOTTLES DRAWN AEROBIC AND ANAEROBIC Blood Culture adequate volume   Culture   Final    NO GROWTH 5 DAYS Performed at [facility], [place] [address] [place], [place] [address]    Report Status 02/06/2019 FINAL  Final  Respiratory Panel by RT PCR (Flu A&B, Covid) - Nasopharyngeal Swab     Status: None   Collection Time: 02/01/19  3:21 PM   Specimen: Nasopharyngeal Swab  Result Value Ref Range Status   SARS Coronavirus 2 by RT  PCR NEGATIVE NEGATIVE Final    Comment: (NOTE) SARS-CoV-2 target nucleic acids are NOT DETECTED. The SARS-CoV-2 RNA is generally detectable in upper respiratoy specimens during the acute phase of infection. The lowest concentration of SARS-CoV-2 viral copies this assay can detect is 131 copies/mL. A negative result does not preclude SARS-Cov-2 infection and should not be used as the sole basis for treatment or other patient management decisions. A negative result may occur with  improper specimen collection/handling, submission of specimen other than nasopharyngeal swab, presence of viral mutation(s) within the areas targeted by this assay, and inadequate number of viral copies (<131 copies/mL). A negative result must be  combined with clinical observations, patient history, and epidemiological information. The expected result is Negative. Fact Sheet for Patients:  PinkCheek.be Fact Sheet for Healthcare Providers:  GravelBags.it This test is not yet ap proved or cleared by the [place] FDA and  has been authorized for detection and/or diagnosis of SARS-CoV-2 by FDA under an Emergency Use Authorization (EUA). This EUA will remain  in effect (meaning this test can be used) for the duration of the COVID-19 declaration under Section 564(b)(1) of the Act, 21 U.S.C. section 360bbb-3(b)(1), unless the authorization is terminated or revoked sooner.    Influenza A by PCR NEGATIVE NEGATIVE Final   Influenza B by PCR NEGATIVE NEGATIVE Final    Comment: (NOTE) The Xpert Xpress SARS-CoV-2/FLU/RSV assay is intended as an aid in  the diagnosis of influenza from Nasopharyngeal swab specimens and  should not be used as a sole basis for treatment. Nasal washings and  aspirates are unacceptable for Xpert Xpress SARS-CoV-2/FLU/RSV  testing. Fact Sheet for Patients: PinkCheek.be Fact Sheet for Healthcare Providers: GravelBags.it This test is not yet approved or cleared by the [place] FDA and  has been authorized for detection and/or diagnosis of SARS-CoV-2 by  FDA under an Emergency Use Authorization (EUA). This EUA will remain  in effect (meaning this test can be used) for the duration of the  Covid-19 declaration under Section 564(b)(1) of the Act, 21  U.S.C. section 360bbb-3(b)(1), unless the authorization is  terminated or revoked. Performed at [facility], [place] [address] [place], [place] [address]   Urine culture     Status: Abnormal   Collection Time: 02/01/19  4:49 PM   Specimen: In/Out Cath Urine  Result Value Ref Range Status   Specimen Description IN/OUT CATH URINE  Final     Special Requests   Final    NONE Performed at [facility], [place] [address] [place], [place] [address]    Culture 50,000 COLONIES/mL KLEBSIELLA PNEUMONIAE (A)  Final   Report Status 02/04/2019 FINAL  Final   Organism ID, Bacteria KLEBSIELLA PNEUMONIAE (A)  Final      Susceptibility   Klebsiella pneumoniae - MIC*    AMPICILLIN >=32 RESISTANT Resistant     CEFAZOLIN <=4 SENSITIVE Sensitive     CEFTRIAXONE <=0.25 SENSITIVE Sensitive     CIPROFLOXACIN <=0.25 SENSITIVE Sensitive     GENTAMICIN <=1 SENSITIVE Sensitive     IMIPENEM <=0.25 SENSITIVE Sensitive     NITROFURANTOIN 128 RESISTANT Resistant     TRIMETH/SULFA <=20 SENSITIVE Sensitive     AMPICILLIN/SULBACTAM 8 SENSITIVE Sensitive     PIP/TAZO <=4 SENSITIVE Sensitive     * 50,000 COLONIES/mL KLEBSIELLA PNEUMONIAE  MRSA PCR Screening     Status: None   Collection Time: 02/02/19  2:40 AM   Specimen: Nasal Mucosa; Nasopharyngeal  Result Value Ref Range Status  MRSA by PCR NEGATIVE NEGATIVE Final    Comment:        The GeneXpert MRSA Assay (FDA approved for NASAL specimens only), is one component of a comprehensive MRSA colonization surveillance program. It is not intended to diagnose MRSA infection nor to guide or monitor treatment for MRSA infections. Performed at [facility], [place] [address], [place], [place] [address]   Body fluid culture (indicate joint specimen source)     Status: None (Preliminary result)   Collection Time: 02/08/19  7:33 PM   Specimen: Synovium; Body Fluid  Result Value Ref Range Status   Specimen Description SYNOVIAL RIGHT KNEE  Final   Special Requests NONE  Final   Gram Stain   Final    ABUNDANT WBC PRESENT, PREDOMINANTLY PMN NO ORGANISMS SEEN    Culture   Final    NO GROWTH < 12 HOURS Performed at [facility], [address], [place], [place] [address]    Report Status PENDING  Incomplete  Culture, blood (routine x 2)     Status: None (Preliminary result)    Collection Time: 02/09/19 12:04 AM   Specimen: BLOOD  Result Value Ref Range Status   Specimen Description BLOOD LEFT ARM  Final   Special Requests   Final    BOTTLES DRAWN AEROBIC ONLY Blood Culture adequate volume   Culture   Final    NO GROWTH < 12 HOURS Performed at [facility], [place] [address] [place], [place] [address]    Report Status PENDING  Incomplete  Culture, blood (routine x 2)     Status: None (Preliminary result)   Collection Time: 02/09/19  1:14 AM   Specimen: BLOOD  Result Value Ref Range Status   Specimen Description BLOOD RIGHT HAND  Final   Special Requests   Final    BOTTLES DRAWN AEROBIC AND ANAEROBIC Blood Culture results may not be optimal due to an inadequate volume of blood received in culture bottles   Culture   Final    NO GROWTH < 12 HOURS Performed at [facility], [place] [address], [place], [place] [address]    Report Status PENDING  Incomplete      Radiology Studies: CT HEAD WO CONTRAST  Result Date: 02/09/2019 CLINICAL DATA:  Encephalopathy, acute mental status change, nonresponsive, history dementia, breast cancer, seizures EXAM: CT HEAD WITHOUT CONTRAST TECHNIQUE: Contiguous axial images were obtained from the base of the skull through the vertex without intravenous contrast. Sagittal and coronal MPR images reconstructed from axial data set. COMPARISON:  02/02/2019 FINDINGS: Brain: Generalized atrophy. Normal ventricular morphology. No midline shift or mass effect. Small vessel chronic ischemic changes of deep cerebral white matter. Small old BILATERAL basal ganglia and LEFT caudate lacunar infarcts. RIGHT basal ganglia appears diffusely slightly lower in attenuation than LEFT, new since prior exam, consistent with subtle new infarct. No intracranial hemorrhage, mass lesion, or acute cortical infarction. No extra-axial fluid collections. Vascular: Small hyperdense focus at RIGHT sylvian fissure likely a focus of thrombus within a sylvian  branch of the RIGHT MCA. Skull: Intact Sinuses/Orbits: Clear Other: N/A IMPRESSION: Atrophy with small vessel chronic ischemic changes of deep cerebral white matter. Small old BILATERAL basal ganglia and LEFT caudate lacunar infarcts. New RIGHT basal ganglia infarct. Small focus of thrombus within a branch of the RIGHT MCA in the RIGHT sylvian fissure. Findings called to Dr. Nani Ravens on 02/09/2019 at 1242 hours. Electronically Signed   By: Crist Infante.D.  On: 02/09/2019 12:43   DG CHEST PORT 1 VIEW  Result Date: 02/09/2019 CLINICAL DATA:  86 year old female with history of fever. EXAM: PORTABLE CHEST 1 VIEW COMPARISON:  Chest x-ray 02/02/2019. FINDINGS: Diffuse ill-defined opacities throughout the mid to lower lungs bilaterally. Small to moderate bilateral pleural effusions lying dependently. Heart size is mildly enlarged. Pulmonary vasculature is partially obscured, but does not appear engorged. Upper mediastinal contours are within normal limits. IMPRESSION: 1. Worsening airspace disease throughout the mid to lower lungs bilaterally with small bilateral pleural effusions. Findings are concerning for multilobar bilateral pneumonia. 2. Mild cardiomegaly. Electronically Signed   By: Vinnie Langton M.D.   On: 02/09/2019 09:29      Scheduled Meds: . chlorhexidine  15 mL Mouth Rinse BID  . Chlorhexidine Gluconate Cloth  6 each Topical Q0600  . donepezil  5 mg Oral QHS  . feeding supplement (ENSURE ENLIVE)  237 mL Oral TID BM  . levETIRAcetam  500 mg Oral BID  . mouth rinse  15 mL Mouth Rinse q12n4p  . methylPREDNISolone acetate  80 mg Intra-articular Once  . multivitamin with minerals  1 tablet Oral Daily  . QUEtiapine  25 mg Oral QHS  . sertraline  25 mg Oral Daily   Continuous Infusions: . ceFEPime (MAXIPIME) IV 2 g (02/09/19 1407)  . dextrose 5 % and 0.45% NaCl 50 mL/hr at 02/08/19 2124  . sodium chloride    . vancomycin 1,500 mg (02/09/19 1442)  . [START ON 02/10/2019] vancomycin        LOS: 8 days   Time spent: 45 minutes   Shelda Pal, DO [facility] 02/09/2019, 4:34 PM   Available via Epic secure chat 7am-7pm After these hours, please refer to coverage provider listed on [email or web address]

## 2019-02-10 LAB — BASIC METABOLIC PANEL
Anion gap: 8 (ref 5–15)
BUN: 28 mg/dL — ABNORMAL HIGH (ref 8–23)
CO2: 22 mmol/L (ref 22–32)
Calcium: 9.1 mg/dL (ref 8.9–10.3)
Chloride: 107 mmol/L (ref 98–111)
Creatinine, Ser: 0.9 mg/dL (ref 0.44–1.00)
GFR calc Af Amer: >60 mL/min (ref >60)
GFR calc non Af Amer: 57 mL/min — ABNORMAL LOW (ref >60)
Glucose, Bld: 118 mg/dL — ABNORMAL HIGH (ref 70–99)
Potassium: 4.8 mmol/L (ref 3.5–5.1)
Sodium: 137 mmol/L (ref 135–145)

## 2019-02-10 LAB — URINALYSIS, ROUTINE W REFLEX MICROSCOPIC
Bilirubin Urine: NEGATIVE
Glucose, UA: NEGATIVE mg/dL
Hgb urine dipstick: NEGATIVE
Ketones, ur: NEGATIVE mg/dL
Leukocytes,Ua: NEGATIVE
Nitrite: NEGATIVE
Protein, ur: 30 mg/dL — AB
Specific Gravity, Urine: 1.021 (ref 1.005–1.030)
pH: 5 (ref 5.0–8.0)

## 2019-02-10 LAB — GLUCOSE, CAPILLARY
Glucose-Capillary: 103 mg/dL — ABNORMAL HIGH (ref 70–99)
Glucose-Capillary: 107 mg/dL — ABNORMAL HIGH (ref 70–99)
Glucose-Capillary: 109 mg/dL — ABNORMAL HIGH (ref 70–99)
Glucose-Capillary: 116 mg/dL — ABNORMAL HIGH (ref 70–99)
Glucose-Capillary: 93 mg/dL (ref 70–99)
Glucose-Capillary: 97 mg/dL (ref 70–99)

## 2019-02-10 MED ORDER — HYDRALAZINE HCL 20 MG/ML IJ SOLN
10.0000 mg | INTRAMUSCULAR | Status: DC | PRN
  Administered 2019-02-10: 10 mg via INTRAVENOUS
  Filled 2019-02-10: qty 1

## 2019-02-10 NOTE — Progress Notes (Signed)
PROGRESS NOTE  Jocelyn Mccann [id number] DOB: 08-Jul-1928 DOA: 02/01/2019 PCP: Alycia Rossetti, MD  HPI/Recap of past 24 hours: Per admitting [id number] E Jocelyn Mccann is a 86 y.o. female with PMH of breast cancer on treatment with anastrozole, followed by Dr. Jana Hakim, dementia followed by Neurology, with recurrent syncope, presenting from home due to fall this morning. Due to the patient's clinical status with confusion, most of the information in this HPI comes from chart review and from her daughter Jocelyn Mccann, who I spoke to over the phone.  The patient had been seen at the ED on 1/20 for a fall at home with laceration repair aboe the left eyebrown but had another unwitnessed fall at home. Her family had contacted TOC and had discussed potential Rehab/memory care placement. The patient has had decline in mentation for the past week but used to be able to communicate her needs  Subjective: Patient seen and examined at bedside very pleasant elderly female with no distress patient was to have been discharged but noted a worsening of her mental status with increased fever and possible sepsis.  Today she looks pleasant little confused at times  Assessment/Plan: Principal Problem:   Acute encephalopathy Active Problems:   Alzheimer disease (HCC)   SDH (subdural hematoma) (HCC)   Hypoglycemia without diagnosis of diabetes mellitus   Seizures (HCC)   Pancytopenia (HCC)   Pressure injury of skin   HCAP (healthcare-associated pneumonia)   Sepsis ([place])  #1 SIRS/sepsis Patient has leukopenia and fever  2.  HCAP. Continue vancomycin and Maxipime she is currently on room air and saturating well  3.  Alzheimer's disease her dementia medicine is on hold  4.  History of seizure disorder change Keppra to 500 mg twice daily IV  5.  Pressure injury of skin located on the heel    6 altered mental status improved patient is alert oriented to person she is very pleasant  Code Status:  Full  Severity of Illness: The appropriate patient status for this patient is INPATIENT. Inpatient status is judged to be reasonable and necessary in order to provide the required intensity of service to ensure the patient's safety. The patient's presenting symptoms, physical exam findings, and initial radiographic and laboratory data in the context of their chronic comorbidities is felt to place them at high risk for further clinical deterioration. Furthermore, it is not anticipated that the patient will be medically stable for discharge from the hospital within 2 midnights of admission. The following factors support the patient status of inpatient.   " The patient's presenting symptoms include worsening sepsis with fever. " The worrisome physical exam findings includethat fever.  Currently on IV Maxipime" The initial radiographic and laboratory data are worrisome because of abnormal urine. " The chronic co-morbidities include elderly.   * I certify that at the point of admission it is my clinical judgment that the patient will require inpatient hospital care spanning beyond 2 midnights from the point of admission due to high intensity of service, high risk for further deterioration and high frequency of surveillance required.*    Family Communication: None  Disposition Plan: SNF barrier to discharge is worsening or condition   Consultants:  PCCM  Ortho  Neuro  Palliative care  Procedures:  EEG  Sutures  Antimicrobials:  Vancomycin  Maxipime  DVT prophylaxis: Lovenox   Objective: Vitals:   02/09/19 2020 02/10/19 0022 02/10/19 0440 02/10/19 0741  BP: 135/84 138/70 (!) 159/85   Pulse:   78 71  Resp: 14 13 17    Temp: 97.8 F (36.6 C) (!) 97.5 F (36.4 C) 97.7 F (36.5 C) 97.6 F (36.4 C)  TempSrc: Axillary Axillary Axillary Axillary  SpO2:   95% 100%  Weight:      Height:        Intake/Output Summary (Last 24 hours) at 02/10/2019 0859 Last data filed at  02/10/2019 0430 Gross per 24 hour  Intake 0 ml  Output 800 ml  Net -800 ml   Filed Weights   02/06/19 0500 02/07/19 0433 02/07/19 2300  Weight: 72.6 kg 75.9 kg 82.7 kg   Body mass index is 33.35 kg/m.  Exam:   General: 86 y.o. year-old female well developed well nourished in no acute distress.  Alert and oriented x1 very pleasant  Cardiovascular: Regular rate and rhythm with no rubs or gallops.  No thyromegaly or JVD noted.    Respiratory: Bilateral rales versus coarse breath sounds. Good inspiratory effort.  No respiratory distress work of breathing is normal  Abdomen: Soft nontender nondistended with normal bowel sounds x4 quadrants.  Musculoskeletal: No lower extremity edema. 2/4 pulses in all 4 extremities.  Skin: No ulcerative lesions noted or rashes,  Psychiatry: Mood is appropriate for condition and setting    Data Reviewed: CBC: Recent Labs  Lab 02/04/19 0230 02/05/19 0217 02/05/19 1255 02/05/19 1255 02/07/19 0227 02/08/19 0814 02/09/19 0004 02/09/19 0113 02/10/19 0402  WBC 3.9*   < > 3.9*   < > 3.7* 4.2 3.3* 2.7* 4.2  NEUTROABS 3.1  --  2.5  --  2.3 2.7  --   --   --   HGB 8.7*   < > 9.0*   < > 8.4* 8.6* 8.2* 8.1* 8.7*  HCT 26.0*   < > 27.9*   < > 25.4* 26.1* 24.6* 24.3* 25.5*  MCV 98.9   < > 102.2*   < > 100.4* 100.8* 100.0 98.0 97.7  PLT 59*   < > 59*   < > 81* 72* 104* 78* 152   < > = values in this interval not displayed.   Basic Metabolic Panel: Recent Labs  Lab 02/05/19 0217 02/06/19 0559 02/07/19 0227 02/08/19 0814 02/09/19 0004 02/09/19 0113 02/10/19 0402  NA 137   < > 137 138 135 136 137  K 4.0   < > 3.9 4.3 4.1 4.2 4.8  CL 108   < > 107 107 106 107 107  CO2 22   < > 24 26 22  21* 22  GLUCOSE 110*   < > 105* 111* 112* 113* 118*  BUN 33*   < > 22 19 22 21  28*  CREATININE 0.83   < > 0.63 0.74 0.72 0.62 0.90  CALCIUM 8.6*   < > 9.4 9.4 9.4 8.9 9.1  MG 1.9  --   --   --   --   --   --    < > = values in this interval not displayed.    GFR: Estimated Creatinine Clearance: 42.2 mL/min (by C-G formula based on SCr of 0.9 mg/dL). Liver Function Tests: Recent Labs  Lab 02/04/19 0230 02/05/19 0217 02/06/19 0559  AST 111* 87* 191*  ALT 129* 111* 184*  ALKPHOS 106 96 121  BILITOT 0.9 0.4 0.8  PROT 4.9* 4.8* 5.7*  ALBUMIN 2.1* 2.0* 2.3*   No results for input(s): LIPASE, AMYLASE in the last 168 hours. No results for input(s): AMMONIA in the last 168 hours. Coagulation Profile: No results for input(s): INR,  PROTIME in the last 168 hours. Cardiac Enzymes: No results for input(s): CKTOTAL, CKMB, CKMBINDEX, TROPONINI in the last 168 hours. BNP (last 3 results) No results for input(s): PROBNP in the last 8760 hours. HbA1C: No results for input(s): HGBA1C in the last 72 hours. CBG: Recent Labs  Lab 02/09/19 1818 02/09/19 2027 02/10/19 0029 02/10/19 0432 02/10/19 0739  GLUCAP 88 114* 116* 107* 103*   Lipid Profile: No results for input(s): CHOL, HDL, LDLCALC, TRIG, CHOLHDL, LDLDIRECT in the last 72 hours. Thyroid Function Tests: Recent Labs    02/09/19 0004  TSH 2.742  FREET4 1.55*   Anemia Panel: No results for input(s): VITAMINB12, FOLATE, FERRITIN, TIBC, IRON, RETICCTPCT in the last 72 hours. Urine analysis:    Component Value Date/Time   COLORURINE YELLOW 02/09/2019 2300   APPEARANCEUR HAZY (A) 02/09/2019 2300   LABSPEC 1.021 02/09/2019 2300   PHURINE 5.0 02/09/2019 2300   GLUCOSEU NEGATIVE 02/09/2019 2300   HGBUR NEGATIVE 02/09/2019 2300   BILIRUBINUR NEGATIVE 02/09/2019 2300   KETONESUR NEGATIVE 02/09/2019 2300   PROTEINUR 30 (A) 02/09/2019 2300   NITRITE NEGATIVE 02/09/2019 2300   LEUKOCYTESUR NEGATIVE 02/09/2019 2300   Sepsis Labs: @LABRCNTIP (procalcitonin:4,lacticidven:4)  ) Recent Results (from the past 240 hour(s))  Blood Culture (routine x 2)     Status: None   Collection Time: 02/01/19  2:31 PM   Specimen: BLOOD  Result Value Ref Range Status   Specimen Description BLOOD RIGHT  ANTECUBITAL  Final   Special Requests   Final    BOTTLES DRAWN AEROBIC AND ANAEROBIC Blood Culture adequate volume   Culture   Final    NO GROWTH 5 DAYS Performed at [facility], [address], [place], [place] [address]    Report Status 02/06/2019 FINAL  Final  Blood Culture (routine x 2)     Status: None   Collection Time: 02/01/19  2:40 PM   Specimen: BLOOD  Result Value Ref Range Status   Specimen Description BLOOD BLOOD LEFT FOREARM  Final   Special Requests   Final    BOTTLES DRAWN AEROBIC AND ANAEROBIC Blood Culture adequate volume   Culture   Final    NO GROWTH 5 DAYS Performed at [facility], [place] [address] [place], [place] [address]    Report Status 02/06/2019 FINAL  Final  Respiratory Panel by RT PCR (Flu A&B, Covid) - Nasopharyngeal Swab     Status: None   Collection Time: 02/01/19  3:21 PM   Specimen: Nasopharyngeal Swab  Result Value Ref Range Status   SARS Coronavirus 2 by RT PCR NEGATIVE NEGATIVE Final    Comment: (NOTE) SARS-CoV-2 target nucleic acids are NOT DETECTED. The SARS-CoV-2 RNA is generally detectable in upper respiratoy specimens during the acute phase of infection. The lowest concentration of SARS-CoV-2 viral copies this assay can detect is 131 copies/mL. A negative result does not preclude SARS-Cov-2 infection and should not be used as the sole basis for treatment or other patient management decisions. A negative result may occur with  improper specimen collection/handling, submission of specimen other than nasopharyngeal swab, presence of viral mutation(s) within the areas targeted by this assay, and inadequate number of viral copies (<131 copies/mL). A negative result must be combined with clinical observations, patient history, and epidemiological information. The expected result is Negative. Fact Sheet for Patients:  PinkCheek.be Fact Sheet for Healthcare Providers:    GravelBags.it This test is not yet ap proved or cleared by the [place] and  has been authorized for detection and/or diagnosis of SARS-CoV-2 by FDA under an Emergency Use Authorization (EUA). This EUA will remain  in effect (meaning this test can be used) for the duration of the COVID-19 declaration under Section 564(b)(1) of the Act, 21 U.S.C. section 360bbb-3(b)(1), unless the authorization is terminated or revoked sooner.    Influenza A by PCR NEGATIVE NEGATIVE Final   Influenza B by PCR NEGATIVE NEGATIVE Final    Comment: (NOTE) The Xpert Xpress SARS-CoV-2/FLU/RSV assay is intended as an aid in  the diagnosis of influenza from Nasopharyngeal swab specimens and  should not be used as a sole basis for treatment. Nasal washings and  aspirates are unacceptable for Xpert Xpress SARS-CoV-2/FLU/RSV  testing. Fact Sheet for Patients: PinkCheek.be Fact Sheet for Healthcare Providers: GravelBags.it This test is not yet approved or cleared by the [place] FDA and  has been authorized for detection and/or diagnosis of SARS-CoV-2 by  FDA under an Emergency Use Authorization (EUA). This EUA will remain  in effect (meaning this test can be used) for the duration of the  Covid-19 declaration under Section 564(b)(1) of the Act, 21  U.S.C. section 360bbb-3(b)(1), unless the authorization is  terminated or revoked. Performed at [facility], [place] [address], [place], [place] [address]   Urine culture     Status: Abnormal   Collection Time: 02/01/19  4:49 PM   Specimen: In/Out Cath Urine  Result Value Ref Range Status   Specimen Description IN/OUT CATH URINE  Final   Special Requests   Final    NONE Performed at [facility], [place] [address], [place], [place] [address]    Culture 50,000 COLONIES/mL KLEBSIELLA PNEUMONIAE (A)  Final   Report Status 02/04/2019 FINAL  Final   Organism  ID, Bacteria KLEBSIELLA PNEUMONIAE (A)  Final      Susceptibility   Klebsiella pneumoniae - MIC*    AMPICILLIN >=32 RESISTANT Resistant     CEFAZOLIN <=4 SENSITIVE Sensitive     CEFTRIAXONE <=0.25 SENSITIVE Sensitive     CIPROFLOXACIN <=0.25 SENSITIVE Sensitive     GENTAMICIN <=1 SENSITIVE Sensitive     IMIPENEM <=0.25 SENSITIVE Sensitive     NITROFURANTOIN 128 RESISTANT Resistant     TRIMETH/SULFA <=20 SENSITIVE Sensitive     AMPICILLIN/SULBACTAM 8 SENSITIVE Sensitive     PIP/TAZO <=4 SENSITIVE Sensitive     * 50,000 COLONIES/mL KLEBSIELLA PNEUMONIAE  MRSA PCR Screening     Status: None   Collection Time: 02/02/19  2:40 AM   Specimen: Nasal Mucosa; Nasopharyngeal  Result Value Ref Range Status   MRSA by PCR NEGATIVE NEGATIVE Final    Comment:        The GeneXpert MRSA Assay (FDA approved for NASAL specimens only), is one component of a comprehensive MRSA colonization surveillance program. It is not intended to diagnose MRSA infection nor to guide or monitor treatment for MRSA infections. Performed at [facility], [place] [address], [place], [place] [address]   Body fluid culture (indicate joint specimen source)     Status: None (Preliminary result)   Collection Time: 02/08/19  7:33 PM   Specimen: Synovium; Body Fluid  Result Value Ref Range Status   Specimen Description SYNOVIAL RIGHT KNEE  Final   Special Requests NONE  Final   Gram Stain   Final    ABUNDANT WBC PRESENT, PREDOMINANTLY PMN NO ORGANISMS SEEN    Culture   Final    NO GROWTH < 12 HOURS Performed  at [facility], [place] [address], [place], [place] [address]    Report Status PENDING  Incomplete  Culture, blood (routine x 2)     Status: None (Preliminary result)   Collection Time: 02/09/19 12:04 AM   Specimen: BLOOD  Result Value Ref Range Status   Specimen Description BLOOD LEFT ARM  Final   Special Requests   Final    BOTTLES DRAWN AEROBIC ONLY Blood Culture adequate volume   Culture   Final      NO GROWTH < 12 HOURS Performed at [facility], [place] [address] [place], [place] [address]    Report Status PENDING  Incomplete  Culture, blood (routine x 2)     Status: None (Preliminary result)   Collection Time: 02/09/19  1:14 AM   Specimen: BLOOD  Result Value Ref Range Status   Specimen Description BLOOD RIGHT HAND  Final   Special Requests   Final    BOTTLES DRAWN AEROBIC AND ANAEROBIC Blood Culture results may not be optimal due to an inadequate volume of blood received in culture bottles   Culture   Final    NO GROWTH < 12 HOURS Performed at [facility], [place] [address] [place], [place] [address]    Report Status PENDING  Incomplete      Studies: CT HEAD WO CONTRAST  Result Date: 02/09/2019 CLINICAL DATA:  Encephalopathy, acute mental status change, nonresponsive, history dementia, breast cancer, seizures EXAM: CT HEAD WITHOUT CONTRAST TECHNIQUE: Contiguous axial images were obtained from the base of the skull through the vertex without intravenous contrast. Sagittal and coronal MPR images reconstructed from axial data set. COMPARISON:  02/02/2019 FINDINGS: Brain: Generalized atrophy. Normal ventricular morphology. No midline shift or mass effect. Small vessel chronic ischemic changes of deep cerebral white matter. Small old BILATERAL basal ganglia and LEFT caudate lacunar infarcts. RIGHT basal ganglia appears diffusely slightly lower in attenuation than LEFT, new since prior exam, consistent with subtle new infarct. No intracranial hemorrhage, mass lesion, or acute cortical infarction. No extra-axial fluid collections. Vascular: Small hyperdense focus at RIGHT sylvian fissure likely a focus of thrombus within a sylvian branch of the RIGHT MCA. Skull: Intact Sinuses/Orbits: Clear Other: N/A IMPRESSION: Atrophy with small vessel chronic ischemic changes of deep cerebral white matter. Small old BILATERAL basal ganglia and LEFT caudate lacunar infarcts. New RIGHT basal  ganglia infarct. Small focus of thrombus within a branch of the RIGHT MCA in the RIGHT sylvian fissure. Findings called to Dr. Nani Ravens on 02/09/2019 at 1242 hours. Electronically Signed   By: Lavonia Dana M.D.   On: 02/09/2019 12:43   MR BRAIN WO CONTRAST  Result Date: 02/09/2019 CLINICAL DATA:  86 year old female with altered mental status, encephalopathy. Recent acute thalamic, deep white matter capsule and right cerebellar infarcts. EXAM: MRI HEAD WITHOUT CONTRAST TECHNIQUE: Multiplanar, multiecho pulse sequences of the brain and surrounding structures were obtained without intravenous contrast. COMPARISON:  Brain MRI 02/03/2019 and earlier. Head CT earlier today. FINDINGS: Study is moderately degraded by motion artifact despite repeated imaging attempts. Brain: Diffusion-weighted imaging is degraded despite repeated imaging attempts. No definite new restricted diffusion or new infarct is identified. Stable gray and white matter signal throughout the brain. No midline shift, mass effect, evidence of mass lesion, ventriculomegaly, extra-axial collection or acute intracranial hemorrhage. Cervicomedullary junction and pituitary are within normal limits. Vascular: Major intracranial vascular flow voids appear stable from the recent MRI. Skull and upper cervical spine: Cervical spine degraded by motion. Visible  bone marrow signal appears within normal limits. Sinuses/Orbits: Stable, negative orbits. Resolved left maxillary sinus mucosal thickening and fluid in the pharynx. Other: Stable trace left mastoid fluid. IMPRESSION: 1. Motion degraded exam despite repeated imaging attempts. 2. No new intracranial abnormality is identified since the MRI 6 days earlier. Electronically Signed   By: Genevie Ann M.D.   On: 02/09/2019 20:17   DG CHEST PORT 1 VIEW  Result Date: 02/09/2019 CLINICAL DATA:  86 year old female with history of fever. EXAM: PORTABLE CHEST 1 VIEW COMPARISON:  Chest x-ray 02/02/2019. FINDINGS: Diffuse  ill-defined opacities throughout the mid to lower lungs bilaterally. Small to moderate bilateral pleural effusions lying dependently. Heart size is mildly enlarged. Pulmonary vasculature is partially obscured, but does not appear engorged. Upper mediastinal contours are within normal limits. IMPRESSION: 1. Worsening airspace disease throughout the mid to lower lungs bilaterally with small bilateral pleural effusions. Findings are concerning for multilobar bilateral pneumonia. 2. Mild cardiomegaly. Electronically Signed   By: Vinnie Langton M.D.   On: 02/09/2019 09:29    Scheduled Meds:  chlorhexidine  15 mL Mouth Rinse BID   Chlorhexidine Gluconate Cloth  6 each Topical Q0600   donepezil  5 mg Oral QHS   feeding supplement (ENSURE ENLIVE)  237 mL Oral TID BM   mouth rinse  15 mL Mouth Rinse q12n4p   methylPREDNISolone acetate  80 mg Intra-articular Once   multivitamin with minerals  1 tablet Oral Daily   QUEtiapine  25 mg Oral QHS   sertraline  25 mg Oral Daily    Continuous Infusions:  ceFEPime (MAXIPIME) IV 2 g (02/10/19 0102)   dextrose 5 % and 0.45% NaCl 50 mL/hr at 02/08/19 2124   levETIRAcetam 500 mg (02/10/19 0552)   sodium chloride     vancomycin       LOS: 9 days     Cristal Deer, MD [facility]  To reach me or the doctor on call, go to: [email or web address] Password [email or web address]  02/10/2019, 8:59 AM

## 2019-02-11 LAB — CBC
HCT: 25.5 % — ABNORMAL LOW (ref 36.0–46.0)
HCT: 28.8 % — ABNORMAL LOW (ref 36.0–46.0)
Hemoglobin: 8.7 g/dL — ABNORMAL LOW (ref 12.0–15.0)
Hemoglobin: 9.6 g/dL — ABNORMAL LOW (ref 12.0–15.0)
MCH: 33.3 pg (ref 26.0–34.0)
MCH: 33 pg (ref 26.0–34.0)
MCHC: 34.1 g/dL (ref 30.0–36.0)
MCHC: 33.3 g/dL (ref 30.0–36.0)
MCV: 97.7 fL (ref 80.0–100.0)
MCV: 99 fL (ref 80.0–100.0)
Platelets: 152 10*3/uL (ref 150–400)
Platelets: 243 10*3/uL (ref 150–400)
RBC: 2.61 MIL/uL — ABNORMAL LOW (ref 3.87–5.11)
RBC: 2.91 MIL/uL — ABNORMAL LOW (ref 3.87–5.11)
RDW: 16.7 % — ABNORMAL HIGH (ref 11.5–15.5)
RDW: 16.4 % — ABNORMAL HIGH (ref 11.5–15.5)
WBC: 4.2 10*3/uL (ref 4.0–10.5)
WBC: 4.5 10*3/uL (ref 4.0–10.5)
nRBC: 0.7 % — ABNORMAL HIGH (ref 0.0–0.2)
nRBC: 0.4 % — ABNORMAL HIGH (ref 0.0–0.2)

## 2019-02-11 LAB — BASIC METABOLIC PANEL
Anion gap: 9 (ref 5–15)
BUN: 26 mg/dL — ABNORMAL HIGH (ref 8–23)
CO2: 21 mmol/L — ABNORMAL LOW (ref 22–32)
Calcium: 9.1 mg/dL (ref 8.9–10.3)
Chloride: 109 mmol/L (ref 98–111)
Creatinine, Ser: 0.8 mg/dL (ref 0.44–1.00)
GFR calc Af Amer: >60 mL/min (ref >60)
GFR calc non Af Amer: >60 mL/min (ref >60)
Glucose, Bld: 106 mg/dL — ABNORMAL HIGH (ref 70–99)
Potassium: 4.6 mmol/L (ref 3.5–5.1)
Sodium: 139 mmol/L (ref 135–145)

## 2019-02-11 LAB — URINE CULTURE: Culture: NO GROWTH

## 2019-02-11 LAB — GLUCOSE, CAPILLARY
Glucose-Capillary: 100 mg/dL — ABNORMAL HIGH (ref 70–99)
Glucose-Capillary: 104 mg/dL — ABNORMAL HIGH (ref 70–99)
Glucose-Capillary: 106 mg/dL — ABNORMAL HIGH (ref 70–99)
Glucose-Capillary: 121 mg/dL — ABNORMAL HIGH (ref 70–99)
Glucose-Capillary: 144 mg/dL — ABNORMAL HIGH (ref 70–99)
Glucose-Capillary: 150 mg/dL — ABNORMAL HIGH (ref 70–99)
Glucose-Capillary: 91 mg/dL (ref 70–99)

## 2019-02-11 LAB — SARS CORONAVIRUS 2 (TAT 6-24 HRS): SARS Coronavirus 2: NEGATIVE

## 2019-02-11 MED ORDER — LEVETIRACETAM 100 MG/ML PO SOLN
500.0000 mg | Freq: Two times a day (BID) | ORAL | Status: DC
  Administered 2019-02-11 – 2019-02-13 (×4): 500 mg via ORAL
  Filled 2019-02-11 (×5): qty 5

## 2019-02-11 MED ORDER — LEVETIRACETAM 500 MG PO TABS: 500.0000 mg | ORAL_TABLET | Freq: Two times a day (BID) | ORAL | Status: DC

## 2019-02-11 NOTE — TOC Progression Note (Signed)
Transition of Care [facility]) - Progression Note    Patient Details  Name: Jocelyn Mccann MRN: [id number] Date of Birth: [birth year]  Transition of Care [facility]) CM/SW [place], LCSW Phone Number: 02/11/2019, 10:28 AM  Clinical Narrative:     CSW spoke with MD Kyung Bacca about patient's COVID test if medically stable for discharge tomorrow. CSW was informed that patient would be medically stable for discharge tomorrow. MD will order COVID test/  TOC team will continue to follow for discharge planning needs.  Expected Discharge Plan: [place] Barriers to Discharge: SNF Pending bed offer, Continued Medical Work up  Expected Discharge Plan and Services Expected Discharge Plan: [place] In-house Referral: Clinical Social Work                                             Social Determinants of Health (SDOH) Interventions    Readmission Risk Interventions No flowsheet data found.

## 2019-02-11 NOTE — Progress Notes (Signed)
PROGRESS NOTE  Jocelyn Mccann [id number] DOB: May 06, 1928 DOA: 02/01/2019 PCP: Alycia Rossetti, MD  HPI/Recap of past 24 hours: Per admitting [id number] Jocelyn Mccann is a 86 y.o. female with PMH of breast cancer on treatment with anastrozole, followed by Dr. Jana Hakim, dementia followed by Neurology, with recurrent syncope, presenting from home due to fall this morning. Due to the patient's clinical status with confusion, most of the information in this HPI comes from chart review and from her daughter Jocelyn Mccann, who I spoke to over the phone.  The patient had been seen at the ED on 1/20 for a fall at home with laceration repair aboe the left eyebrown but had another unwitnessed fall at home. Her family had contacted TOC and had discussed potential Rehab/memory care placement. The patient has had decline in mentation for the past week but used to be able to communicate her needs  Subjective: Patient seen and examined at bedside very pleasant elderly female with no distress patient was to have been discharged but noted a worsening of her mental status with increased fever and possible sepsis.  Today she looks pleasant little confused at times  February 11, 2019. Subjective: Patient seen and examined at bedside she is wearing mittens because she was trying to pull out her IV overnight.  Patient was not as pleasant as she was yesterday when I asked her what was if she was not doing okay she can of she nodded.  Patient is waiting for bed at nursing home placement.  I will order her COVID-19 test today possibly discharge to SNF Monday  Assessment/Plan: Principal Problem:   Acute encephalopathy Active Problems:   Alzheimer disease ([place])   SDH (subdural hematoma) (HCC)   Hypoglycemia without diagnosis of diabetes mellitus   Seizures (HCC)   Pancytopenia (HCC)   Pressure injury of skin   HCAP (healthcare-associated pneumonia)   Sepsis ([place])  #1 SIRS/sepsis Patient has leukopenia and  fever  2.  HCAP. Continue vancomycin and Maxipime she is currently on room air and saturating well  3.  Alzheimer's disease her dementia medicine is on hold  4.  History of seizure disorder change Keppra to 500 mg twice daily IV  5.  Pressure injury of skin located on the heel    6 altered mental status improved patient is alert oriented to person she is very pleasant  Code Status: Full  Severity of Illness: The appropriate patient status for this patient is INPATIENT. Inpatient status is judged to be reasonable and necessary in order to provide the required intensity of service to ensure the patient's safety. The patient's presenting symptoms, physical exam findings, and initial radiographic and laboratory data in the context of their chronic comorbidities is felt to place them at high risk for further clinical deterioration. Furthermore, it is not anticipated that the patient will be medically stable for discharge from the hospital within 2 midnights of admission. The following factors support the patient status of inpatient.   " The patient's presenting symptoms include worsening sepsis with fever. " The worrisome physical exam findings includethat fever.  Currently on IV Maxipime" The initial radiographic and laboratory data are worrisome because of abnormal urine. " The chronic co-morbidities include elderly.   * I certify that at the point of admission it is my clinical judgment that the patient will require inpatient hospital care spanning beyond 2 midnights from the point of admission due to high intensity of service, high risk for further deterioration and high frequency of  surveillance required.*    Family Communication: None  Disposition Plan: SNF barrier to discharge is waiting for SNF bed availability.  Possibly discharge to SNF Monday   Consultants:  PCCM  Ortho  Neuro  Palliative  care  Procedures:  EEG  Sutures  Antimicrobials:  Vancomycin  Maxipime  DVT prophylaxis: Lovenox   Objective: Vitals:   02/10/19 1616 02/10/19 2026 02/11/19 0503 02/11/19 0812  BP: (!) 154/82 (!) 155/85 (!) 139/95 (!) 145/89  Pulse: 82 93 80   Resp:  20 12 19   Temp: 98.9 F (37.2 C) 97.9 F (36.6 C) 98.5 F (36.9 C) 97.6 F (36.4 C)  TempSrc: Oral Oral Axillary Axillary  SpO2: 100% 99% 99% 100%  Weight:   62.6 kg   Height:        Intake/Output Summary (Last 24 hours) at 02/11/2019 1108 Last data filed at 02/11/2019 1000 Gross per 24 hour  Intake 780 ml  Output 650 ml  Net 130 ml   Filed Weights   02/07/19 0433 02/07/19 2300 02/11/19 0503  Weight: 75.9 kg 82.7 kg 62.6 kg   Body mass index is 25.24 kg/m.  Exam:  . General: 86 y.o. year-old female well developed well nourished in no acute distress.  Alert and oriented x1 very pleasant . Cardiovascular: Regular rate and rhythm with no rubs or gallops.  No thyromegaly or JVD noted.   Marland Kitchen Respiratory: Bilateral rales versus coarse breath sounds. Good inspiratory effort.  No respiratory distress work of breathing is normal . Abdomen: Soft nontender nondistended with normal bowel sounds x4 quadrants. . Musculoskeletal: No lower extremity edema. 2/4 pulses in all 4 extremities. . Skin: No ulcerative lesions noted or rashes, . Psychiatry: Mood is appropriate for condition and setting    Data Reviewed: CBC: Recent Labs  Lab 02/05/19 1255 02/05/19 1255 02/07/19 0227 02/07/19 0227 02/08/19 0814 02/09/19 0004 02/09/19 0113 02/10/19 0402 02/11/19 0318  WBC 3.9*   < > 3.7*   < > 4.2 3.3* 2.7* 4.2 4.5  NEUTROABS 2.5  --  2.3  --  2.7  --   --   --   --   HGB 9.0*   < > 8.4*   < > 8.6* 8.2* 8.1* 8.7* 9.6*  HCT 27.9*   < > 25.4*   < > 26.1* 24.6* 24.3* 25.5* 28.8*  MCV 102.2*   < > 100.4*   < > 100.8* 100.0 98.0 97.7 99.0  PLT 59*   < > 81*   < > 72* 104* 78* 152 243   < > = values in this interval not  displayed.   Basic Metabolic Panel: Recent Labs  Lab 02/05/19 0217 02/06/19 0559 02/08/19 [id number] 02/09/19 0004 02/09/19 0113 02/10/19 0402 02/11/19 0318  NA 137   < > 138 135 136 137 139  K 4.0   < > 4.3 4.1 4.2 4.8 4.6  CL 108   < > 107 106 107 107 109  CO2 22   < > 26 22 21* 22 21*  GLUCOSE 110*   < > 111* 112* 113* 118* 106*  BUN 33*   < > 19 22 21  28* 26*  CREATININE 0.83   < > 0.74 0.72 0.62 0.90 0.80  CALCIUM 8.6*   < > 9.4 9.4 8.9 9.1 9.1  MG 1.9  --   --   --   --   --   --    < > = values in this interval not displayed.  GFR: Estimated Creatinine Clearance: 41.5 mL/min (by C-G formula based on SCr of 0.8 mg/dL). Liver Function Tests: Recent Labs  Lab 02/05/19 0217 02/06/19 0559  AST 87* 191*  ALT 111* 184*  ALKPHOS 96 121  BILITOT 0.4 0.8  PROT 4.8* 5.7*  ALBUMIN 2.0* 2.3*   No results for input(s): LIPASE, AMYLASE in the last 168 hours. No results for input(s): AMMONIA in the last 168 hours. Coagulation Profile: No results for input(s): INR, PROTIME in the last 168 hours. Cardiac Enzymes: No results for input(s): CKTOTAL, CKMB, CKMBINDEX, TROPONINI in the last 168 hours. BNP (last 3 results) No results for input(s): PROBNP in the last 8760 hours. HbA1C: No results for input(s): HGBA1C in the last 72 hours. CBG: Recent Labs  Lab 02/10/19 1611 02/10/19 2029 02/11/19 0030 02/11/19 0503 02/11/19 0758  GLUCAP 93 109* 104* 100* 91   Lipid Profile: No results for input(s): CHOL, HDL, LDLCALC, TRIG, CHOLHDL, LDLDIRECT in the last 72 hours. Thyroid Function Tests: Recent Labs    02/09/19 0004  TSH 2.742  FREET4 1.55*   Anemia Panel: No results for input(s): VITAMINB12, FOLATE, FERRITIN, TIBC, IRON, RETICCTPCT in the last 72 hours. Urine analysis:    Component Value Date/Time   COLORURINE YELLOW 02/09/2019 2300   APPEARANCEUR HAZY (A) 02/09/2019 2300   LABSPEC 1.021 02/09/2019 2300   PHURINE 5.0 02/09/2019 2300   GLUCOSEU NEGATIVE 02/09/2019  2300   HGBUR NEGATIVE 02/09/2019 2300   BILIRUBINUR NEGATIVE 02/09/2019 2300   KETONESUR NEGATIVE 02/09/2019 2300   PROTEINUR 30 (A) 02/09/2019 2300   NITRITE NEGATIVE 02/09/2019 2300   LEUKOCYTESUR NEGATIVE 02/09/2019 2300   Sepsis Labs: @LABRCNTIP (procalcitonin:4,lacticidven:4)  ) Recent Results (from the past 240 hour(s))  Blood Culture (routine x 2)     Status: None   Collection Time: 02/01/19  2:31 PM   Specimen: BLOOD  Result Value Ref Range Status   Specimen Description BLOOD RIGHT ANTECUBITAL  Final   Special Requests   Final    BOTTLES DRAWN AEROBIC AND ANAEROBIC Blood Culture adequate volume   Culture   Final    NO GROWTH 5 DAYS Performed at [facility], [address], [place], [place] [address]    Report Status 02/06/2019 FINAL  Final  Blood Culture (routine x 2)     Status: None   Collection Time: 02/01/19  2:40 PM   Specimen: BLOOD  Result Value Ref Range Status   Specimen Description BLOOD BLOOD LEFT FOREARM  Final   Special Requests   Final    BOTTLES DRAWN AEROBIC AND ANAEROBIC Blood Culture adequate volume   Culture   Final    NO GROWTH 5 DAYS Performed at [facility], [place] [address] [place], [place] [address]    Report Status 02/06/2019 FINAL  Final  Respiratory Panel by RT PCR (Flu A&B, Covid) - Nasopharyngeal Swab     Status: None   Collection Time: 02/01/19  3:21 PM   Specimen: Nasopharyngeal Swab  Result Value Ref Range Status   SARS Coronavirus 2 by RT PCR NEGATIVE NEGATIVE Final    Comment: (NOTE) SARS-CoV-2 target nucleic acids are NOT DETECTED. The SARS-CoV-2 RNA is generally detectable in upper respiratoy specimens during the acute phase of infection. The lowest concentration of SARS-CoV-2 viral copies this assay can detect is 131 copies/mL. A negative result does not preclude SARS-Cov-2 infection and should not be used as the sole basis for treatment or other patient management decisions. A negative result may occur with  improper specimen collection/handling, submission of specimen other than nasopharyngeal swab, presence of viral mutation(s) within the areas targeted by this assay, and inadequate number of viral copies (<131 copies/mL). A negative result must be combined with clinical observations, patient history, and epidemiological information. The expected result is Negative. Fact Sheet for Patients:  PinkCheek.be Fact Sheet for Healthcare Providers:  GravelBags.it This test is not yet ap proved or cleared by the [place] FDA and  has been authorized for detection and/or diagnosis of SARS-CoV-2 by FDA under an Emergency Use Authorization (EUA). This EUA will remain  in effect (meaning this test can be used) for the duration of the COVID-19 declaration under Section 564(b)(1) of the Act, 21 U.S.C. section 360bbb-3(b)(1), unless the authorization is terminated or revoked sooner.    Influenza A by PCR NEGATIVE NEGATIVE Final   Influenza B by PCR NEGATIVE NEGATIVE Final    Comment: (NOTE) The Xpert Xpress SARS-CoV-2/FLU/RSV assay is intended as an aid in  the diagnosis of influenza from Nasopharyngeal swab specimens and  should not be used as a sole basis for treatment. Nasal washings and  aspirates are unacceptable for Xpert Xpress SARS-CoV-2/FLU/RSV  testing. Fact Sheet for Patients: PinkCheek.be Fact Sheet for Healthcare Providers: GravelBags.it This test is not yet approved or cleared by the [place] FDA and  has been authorized for detection and/or diagnosis of SARS-CoV-2 by  FDA under an Emergency Use Authorization (EUA). This EUA will remain  in effect (meaning this test can be used) for the duration of the  Covid-19 declaration under Section 564(b)(1) of the Act, 21  U.S.C. section 360bbb-3(b)(1), unless the authorization is  terminated or revoked. Performed at  [facility], [place] [address] [place], [place] [address]   Urine culture     Status: Abnormal   Collection Time: 02/01/19  4:49 PM   Specimen: In/Out Cath Urine  Result Value Ref Range Status   Specimen Description IN/OUT CATH URINE  Final   Special Requests   Final    NONE Performed at [facility], [place] [address], [place], [place] [address]    Culture 50,000 COLONIES/mL KLEBSIELLA PNEUMONIAE (A)  Final   Report Status 02/04/2019 FINAL  Final   Organism ID, Bacteria KLEBSIELLA PNEUMONIAE (A)  Final      Susceptibility   Klebsiella pneumoniae - MIC*    AMPICILLIN >=32 RESISTANT Resistant     CEFAZOLIN <=4 SENSITIVE Sensitive     CEFTRIAXONE <=0.25 SENSITIVE Sensitive     CIPROFLOXACIN <=0.25 SENSITIVE Sensitive     GENTAMICIN <=1 SENSITIVE Sensitive     IMIPENEM <=0.25 SENSITIVE Sensitive     NITROFURANTOIN 128 RESISTANT Resistant     TRIMETH/SULFA <=20 SENSITIVE Sensitive     AMPICILLIN/SULBACTAM 8 SENSITIVE Sensitive     PIP/TAZO <=4 SENSITIVE Sensitive     * 50,000 COLONIES/mL KLEBSIELLA PNEUMONIAE  MRSA PCR Screening     Status: None   Collection Time: 02/02/19  2:40 AM   Specimen: Nasal Mucosa; Nasopharyngeal  Result Value Ref Range Status   MRSA by PCR NEGATIVE NEGATIVE Final    Comment:        The GeneXpert MRSA Assay (FDA approved for NASAL specimens only), is one component of a comprehensive MRSA colonization surveillance program. It is not intended to diagnose MRSA infection nor to guide or monitor treatment for MRSA infections. Performed at [facility], [place] [address] [place], [place] [address]   Body fluid culture (indicate joint specimen source)  Status: None (Preliminary result)   Collection Time: 02/08/19  7:33 PM   Specimen: Synovium; Body Fluid  Result Value Ref Range Status   Specimen Description SYNOVIAL RIGHT KNEE  Final   Special Requests NONE  Final   Gram Stain   Final    ABUNDANT WBC PRESENT, PREDOMINANTLY PMN NO  ORGANISMS SEEN    Culture   Final    NO GROWTH 3 DAYS Performed at [facility], [address], [place], [place] [address]    Report Status PENDING  Incomplete  Culture, blood (routine x 2)     Status: None (Preliminary result)   Collection Time: 02/09/19 12:04 AM   Specimen: BLOOD  Result Value Ref Range Status   Specimen Description BLOOD LEFT ARM  Final   Special Requests   Final    BOTTLES DRAWN AEROBIC ONLY Blood Culture adequate volume   Culture   Final    NO GROWTH 1 DAY Performed at [facility], [place] [address] [place], [place] [address]    Report Status PENDING  Incomplete  Culture, blood (routine x 2)     Status: None (Preliminary result)   Collection Time: 02/09/19  1:14 AM   Specimen: BLOOD  Result Value Ref Range Status   Specimen Description BLOOD RIGHT HAND  Final   Special Requests   Final    BOTTLES DRAWN AEROBIC AND ANAEROBIC Blood Culture results may not be optimal due to an inadequate volume of blood received in culture bottles   Culture   Final    NO GROWTH 1 DAY Performed at [facility], [place] [address] [place], [place] [address]    Report Status PENDING  Incomplete  Culture, Urine     Status: None   Collection Time: 02/09/19 10:59 PM   Specimen: Urine, Random  Result Value Ref Range Status   Specimen Description URINE, RANDOM  Final   Special Requests NONE  Final   Culture   Final    NO GROWTH Performed at [facility], [place] [address], [place], [place] [address]    Report Status 02/11/2019 FINAL  Final      Studies: No results found.  Scheduled Meds: . chlorhexidine  15 mL Mouth Rinse BID  . Chlorhexidine Gluconate Cloth  6 each Topical Q0600  . donepezil  5 mg Oral QHS  . feeding supplement (ENSURE ENLIVE)  237 mL Oral TID BM  . levETIRAcetam  500 mg Oral Q12H  . mouth rinse  15 mL Mouth Rinse q12n4p  . methylPREDNISolone acetate  80 mg Intra-articular Once  . multivitamin with minerals  1 tablet Oral Daily  .  QUEtiapine  25 mg Oral QHS  . sertraline  25 mg Oral Daily    Continuous Infusions: . ceFEPime (MAXIPIME) IV 2 g (02/10/19 2306)  . dextrose 5 % and 0.45% NaCl 50 mL/hr at 02/08/19 2124  . sodium chloride    . vancomycin 750 mg (02/10/19 1259)     LOS: 10 days     Cristal Deer, MD [facility]  To reach me or the doctor on call, go to: [email or web address] Password [email or web address]  02/11/2019, 11:08 AM

## 2019-02-12 ENCOUNTER — Encounter (HOSPITAL_COMMUNITY): Payer: Self-pay | Admitting: Internal Medicine

## 2019-02-12 DIAGNOSIS — C50919 Malignant neoplasm of unspecified site of unspecified female breast: Secondary | ICD-10-CM | POA: Diagnosis not present

## 2019-02-12 DIAGNOSIS — L89612 Pressure ulcer of right heel, stage 2: Secondary | ICD-10-CM | POA: Diagnosis not present

## 2019-02-12 DIAGNOSIS — J189 Pneumonia, unspecified organism: Secondary | ICD-10-CM

## 2019-02-12 DIAGNOSIS — G40909 Epilepsy, unspecified, not intractable, without status epilepticus: Secondary | ICD-10-CM | POA: Diagnosis not present

## 2019-02-12 LAB — CBC
HCT: 26.7 % — ABNORMAL LOW (ref 36.0–46.0)
Hemoglobin: 8.9 g/dL — ABNORMAL LOW (ref 12.0–15.0)
MCH: 33 pg (ref 26.0–34.0)
MCHC: 33.3 g/dL (ref 30.0–36.0)
MCV: 98.9 fL (ref 80.0–100.0)
Platelets: 233 10*3/uL (ref 150–400)
RBC: 2.7 MIL/uL — ABNORMAL LOW (ref 3.87–5.11)
RDW: 16.3 % — ABNORMAL HIGH (ref 11.5–15.5)
WBC: 4.3 10*3/uL (ref 4.0–10.5)
nRBC: 0.5 % — ABNORMAL HIGH (ref 0.0–0.2)

## 2019-02-12 LAB — BODY FLUID CULTURE: Culture: NO GROWTH

## 2019-02-12 LAB — GLUCOSE, CAPILLARY
Glucose-Capillary: 100 mg/dL — ABNORMAL HIGH (ref 70–99)
Glucose-Capillary: 105 mg/dL — ABNORMAL HIGH (ref 70–99)
Glucose-Capillary: 107 mg/dL — ABNORMAL HIGH (ref 70–99)
Glucose-Capillary: 151 mg/dL — ABNORMAL HIGH (ref 70–99)
Glucose-Capillary: 98 mg/dL (ref 70–99)

## 2019-02-12 LAB — BASIC METABOLIC PANEL
Anion gap: 9 (ref 5–15)
BUN: 15 mg/dL (ref 8–23)
CO2: 24 mmol/L (ref 22–32)
Calcium: 9.4 mg/dL (ref 8.9–10.3)
Chloride: 106 mmol/L (ref 98–111)
Creatinine, Ser: 0.68 mg/dL (ref 0.44–1.00)
GFR calc Af Amer: >60 mL/min (ref >60)
GFR calc non Af Amer: >60 mL/min (ref >60)
Glucose, Bld: 107 mg/dL — ABNORMAL HIGH (ref 70–99)
Potassium: 4.2 mmol/L (ref 3.5–5.1)
Sodium: 139 mmol/L (ref 135–145)

## 2019-02-12 NOTE — Progress Notes (Signed)
Daily Progress Note   Patient Name: Jocelyn Mccann       Date: 02/12/2019 DOB: November 21, 1928  Age: 86 y.o. MRN#: [id number] Attending Physician: Debbe Odea, MD Primary Care Physician: Alycia Rossetti, MD Admit Date: 02/01/2019  Reason for Consultation/Follow-up: Establishing goals of care  Subjective/GOC: Patient wakes to voice. She is oriented to name, otherwise disoriented with baseline dementia. She follows simple commands and did work with PT today. She does not appear to be in pain or discomfort.  Met with daughter this afternoon. She has completed MOST form and desires ongoing FULL code/FULL scope treatment. Copies made for EMR and daughter.   Updated her on plan of care and pending SNF discharge for rehab. Educated on disease trajectory of dementia and high risk for recurrent complications/hospitalizations with progression of dementia. Encouraged ongoing [place] discussions with her brother (in [place]) as their mother's dementia continues to progress, explaining that medically we do not recommend aggressive measures at EOL with underlying irreversible condition such as dementia. [facility] Choices copy given for review.   Length of Stay: 11  Current Medications: Scheduled Meds:  . chlorhexidine  15 mL Mouth Rinse BID  . donepezil  5 mg Oral QHS  . feeding supplement (ENSURE ENLIVE)  237 mL Oral TID BM  . levETIRAcetam  500 mg Oral Q12H  . mouth rinse  15 mL Mouth Rinse q12n4p  . methylPREDNISolone acetate  80 mg Intra-articular Once  . multivitamin with minerals  1 tablet Oral Daily  . QUEtiapine  25 mg Oral QHS  . sertraline  25 mg Oral Daily    Continuous Infusions: . sodium chloride      PRN Meds: acetaminophen **OR** acetaminophen, bisacodyl, hydrALAZINE, lidocaine,  [DISCONTINUED] ondansetron **OR** ondansetron (ZOFRAN) IV  Physical Exam Vitals and nursing note reviewed.  Constitutional:      Appearance: She is ill-appearing.  HENT:     Head: Normocephalic and atraumatic.  Cardiovascular:     Rate and Rhythm: Normal rate.  Pulmonary:     Effort: No tachypnea, accessory muscle usage or respiratory distress.  Skin:    General: Skin is warm and dry.  Neurological:     Mental Status: She is easily aroused.     Comments: Oriented to name, otherwise disoriented with baseline dementia. Follows simple commands.  Vital Signs: BP (!) 168/88   Pulse 84   Temp (!) 97.4 F (36.3 C) (Oral)   Resp 19   Ht '5\' 2"'$  (1.575 m)   Wt 53.1 kg   SpO2 100%   BMI 21.40 kg/m  SpO2: SpO2: 100 % O2 Device: O2 Device: Room Air O2 Flow Rate: O2 Flow Rate (L/min): 2 L/min  Intake/output summary:   Intake/Output Summary (Last 24 hours) at 02/12/2019 1422 Last data filed at 02/12/2019 0998 Gross per 24 hour  Intake 1459.94 ml  Output 200 ml  Net 1259.94 ml   LBM: Last BM Date: 02/10/19 Baseline Weight: Weight: 61 kg Most recent weight: Weight: 53.1 kg       Palliative Assessment/Data: PPS 40%    Flowsheet Rows     Most Recent Value  Intake Tab  Referral Department  Critical care  Unit at Time of Referral  ICU  Palliative Care Primary Diagnosis  Neurology  Date Notified  02/01/19  Palliative Care Type  New Palliative care  Reason for referral  Clarify Goals of Care  Date of Admission  02/01/19  Date first seen by Palliative Care  02/04/19  # of days Palliative referral response time  3 Day(s)  # of days IP prior to Palliative referral  0  Clinical Assessment  Palliative Performance Scale Score  40%  Psychosocial & Spiritual Assessment  Palliative Care Outcomes  Patient/Family meeting held?  Yes  Who was at the meeting?  daughter, Dr. Cathlean Sauer present  Palliative Care Outcomes  Clarified goals of care, Provided end of life care  assistance, Provided psychosocial or spiritual support, ACP counseling assistance, Linked to palliative care logitudinal support      Patient Active Problem List   Diagnosis Date Noted  . HCAP (healthcare-associated pneumonia) 02/09/2019  . Sepsis ([place]) 02/09/2019  . Pressure injury of skin 02/07/2019  . Hypoglycemia without diagnosis of diabetes mellitus 02/06/2019  . Seizures ([place]) 02/06/2019  . Pancytopenia ([place]) 02/06/2019  . Palliative care by specialist   . Goals of care, counseling/discussion   . Acute encephalopathy   . Dysphagia   . SIRS (systemic inflammatory response syndrome) ([place]) 02/01/2019  . Syncope 12/05/2018  . SDH (subdural hematoma) ([place]) 08/21/2018  . Malignant neoplasm of upper-outer quadrant of right breast in female, estrogen receptor positive ([place]) 12/20/2017  . Osteoarthritis of knee 06/01/2017  . Bilateral knee pain 03/31/2017  . Urge incontinence of urine 03/31/2017  . Generalized anxiety disorder 03/31/2017  . Osteoporosis without current pathological fracture 03/31/2017  . Alzheimer disease ([place]) 03/31/2017    Palliative Care Assessment & Plan   Patient Profile: 86 y.o. female  with past medical history of Alzheimer's dementia, seizure disorder, right breast cancer on anastrozole, frequent falls admitted on 02/01/2019 with unwitnessed fall and worsening confusion. Become obtunded in ED after witnessed seizure episode subsequently requiring intubation. Became hypotensive in ED and found to have glucose of 23. ICU admission for septic shock, likely secondary to UTI, encephalopathy, and seizure. Extubated 1/24. Receiving tube feeds via cortrak. Neurology following. Brain, c-spine MRI negative for acute process. Receiving Keppra. Pending PT/OT. Passed SLP evaluation on 1/26 and started on dysphagia diet. Palliative medicine consultation for goals of care.   Assessment: SIRS/sepsis HCAP Alzheimer's disease Seizure  disorder AMS  Recommendations/Plan:  MOST completed. Daughter wishes for ongoing FULL code/FULL scope treatment. Copies of MOST given to daughter and placed in chart for EMR.  Pending discharge to SNF for rehab.  Continue PT/OT/SLP efforts.  May benefit from  outpatient palliative referral for ongoing [place] discussions.   Goals of Care and Additional Recommendations:  Limitations on Scope of Treatment: Full Scope Treatment  Code Status: FULL   Code Status Orders  (From admission, onward)         Start     Ordered   02/02/19 0054  Full code  Continuous     02/02/19 0053        Code Status History    Date Active Date Inactive Code Status Order ID Comments User Context   02/01/2019 1809 02/02/2019 0053 Full Code [id number]  Blain Pais, MD ED   08/21/2018 1523 08/26/2018 1818 Full Code [id number]  Karmen Bongo, MD ED   Advance Care Planning Activity       Prognosis:   Unable to determine  Discharge Planning:  [facility] for rehab with Palliative care service follow-up  Care plan was discussed with RN, daughter   Thank you for allowing the Palliative Medicine Team to assist in the care of this patient.   Time In: 1410- Time Out: 1450 Total Time 40 Prolonged Time Billed no      Greater than 50%  of this time was spent counseling and coordinating care related to the above assessment and plan.  Ihor Dow, DNP,FNP-C Palliative Medicine Team  Phone: [phone] Fax: [phone]  Please contact Palliative Medicine Team phone at [phone] for questions and concerns.

## 2019-02-12 NOTE — Progress Notes (Signed)
  Speech Language Pathology Treatment: Dysphagia  Patient Details Name: Jocelyn Mccann MRN: [id number] DOB: November 21, 1928 Today's Date: 02/12/2019 Time: [id number] SLP Time Calculation (min) (ACUTE ONLY): 17 min  Assessment / Plan / Recommendation Clinical Impression  Pt seen at bedside for skilled ST intervention targeting goals for diet tolerance and advancement. Pt was alert and talkative today. Pt accepted trials of puree with timely oral clearing. Pt accepted trials of canned peaches (dys 2 texture), however, she exhibited poor awareness and extended mastication of the bolus, with diffuse residual noted in the oral cavity. Given pt cognitive deficits and difficulty following commands, recommend continuing puree diet/thin liquids for primary nutrition/hydration. Will continue to provide trials of dys 2 textures in therapy, however, will hold on advancing diet due to increased risk for fatigue and consequent increase in aspiration risk. Safe swallow precautions were posted at [facility].     HPI HPI: Jocelyn Mccann is a 86 y.o. female with h/o right breast ca on anastrozole, Alzheimer's dementia, seizure disorder and recent history of falls who presented to the ED on 1/21 after an unwitnessed fall and worsening confusion over the last week. This is her third ED visit in the last 7 days for a fall. She was previously seen on 1/20 for a fall and required sutures above her left eye. On presentation to the ED, she was hypothermic and bradycardic with recorded temp of 59F on arrival. Bair hugger was placed and cultures were obtained. Urinalysis was largely unremarkable. CXR showed no evidence for pneumonia. MRi on 1/23 shows Punctate acute/early subacute infarcts within the right thalamus right thalamocapsular junction, right external capsule and right cerebellum. Pt also found to have osteophytes through the cervical spine.       SLP Plan  Continue with current plan of care       Recommendations  Diet  recommendations: Dysphagia 1 (puree);Thin liquid Liquids provided via: Straw Medication Administration: Crushed with puree Supervision: Full supervision/cueing for compensatory strategies;Trained caregiver to feed patient Compensations: Slow rate;Small sips/bites;Minimize environmental distractions Postural Changes and/or Swallow Maneuvers: Seated upright 90 degrees;Upright 30-60 min after meal                Oral Care Recommendations: Oral care BID Follow up Recommendations: Skilled Nursing facility;24 hour supervision/assistance SLP Visit Diagnosis: Dysphagia, oral phase (R13.11) Plan: Continue with current plan of care       GO              Kieley Akter B. Quentin Ore, [facility], [place] Speech Language Pathologist Office: [phone] Pager: [phone]   Shonna Chock 02/12/2019, 12:48 PM

## 2019-02-12 NOTE — Care Management Important Message (Signed)
Important Message  Patient Details  Name: Jocelyn Mccann MRN: [id number] Date of Birth: [birth year]   Medicare Important Message Given:  Yes     Shelda Altes 02/12/2019, 2:11 PM

## 2019-02-12 NOTE — TOC Progression Note (Signed)
Transition of Care [facility]) - Progression Note    Patient Details  Name: Jocelyn Mccann MRN: [id number] Date of Birth: August 23, 1928  Transition of Care [facility]) CM/SW [place], [place] Phone Number: 02/12/2019, 11:17 AM  Clinical Narrative:      CSW contacted [place] was informed insurance authorization is needed before the patient can d/c to SNF. SNF started insurance authorization process today.  Thurmond Butts, MSW, [place] Clinical Social Worker   Expected Discharge Plan: Skilled Nursing Facility Barriers to Discharge: Insurance Authorization  Expected Discharge Plan and Services Expected Discharge Plan: [place] In-house Referral: Clinical Social Work                                             Social Determinants of Health (SDOH) Interventions    Readmission Risk Interventions No flowsheet data found.

## 2019-02-12 NOTE — Progress Notes (Addendum)
PROGRESS NOTE    Jocelyn Mccann   [id number]  DOB: May 13, 1928  DOA: 02/01/2019 PCP: Alycia Rossetti, MD   Brief Narrative:  Jocelyn Mccann is a 86 y.o.femalewith PMH of breast cancer on treatment with anastrozole, followed by Dr. Jana Hakim, dementia followed by Neurology, with recurrent syncope, presenting from home due to fall this morning. Due to the patient's clinical status with confusion, most of the information in this HPI comes from chart review and from her daughter [name], who I spoke to over the phone.  The patient had been seen at the ED on 1/20 for a fall at home with laceration repair aboe the left eyebrown but had another unwitnessed fall at home. Her family had contacted TOC and had discussed potential Rehab/memory care placement. The patient has had decline in mentation.   Subjective: Confused and agitated while being cleaned up by RN.    Assessment & Plan:   Principal Problem:   Acute encephalopathy superimposed on Alzheimer's Dementia - due to HCAP? -quite alert today-cont Aricept and Seroquel  Active Problems: HCAP/ Sepsis - she has received 7 days of Vanc and Cefepime as of today- will d/c antibiotics today  Right heel stage 2 pressure injury - cont to elevate     Hypoglycemia without diagnosis of diabetes mellitus - ? Due to sepsis- resolved- sugars have been norma- will stop checking them    Seizure disorder  - cont Keppra BID  Time spent in minutes: 35 DVT prophylaxis: SCDs Code Status: Full code Family Communication:  Disposition Plan: needs insurance approval for SNF Consultants:   none Procedures:   none Antimicrobials:  Anti-infectives (From admission, onward)   Start     Dose/Rate Route Frequency Ordered Stop   02/10/19 1300  vancomycin (VANCOREADY) IVPB 750 mg/150 mL  Status:  Discontinued     750 mg 150 mL/hr over 60 Minutes Intravenous Every 24 hours 02/09/19 1148 02/12/19 0858   02/09/19 1200  vancomycin (VANCOREADY)  IVPB 1500 mg/300 mL     1,500 mg 150 mL/hr over 120 Minutes Intravenous  Once 02/09/19 1148 02/09/19 1642   02/09/19 1200  ceFEPIme (MAXIPIME) 2 g in sodium chloride 0.9 % 100 mL IVPB  Status:  Discontinued     2 g 200 mL/hr over 30 Minutes Intravenous Every 12 hours 02/09/19 1148 02/12/19 0858   02/04/19 1100  cefTRIAXone (ROCEPHIN) 1 g in sodium chloride 0.9 % 100 mL IVPB     1 g 200 mL/hr over 30 Minutes Intravenous Every 24 hours 02/04/19 0950 02/06/19 1142   02/02/19 1800  vancomycin (VANCOCIN) IVPB 1000 mg/200 mL premix  Status:  Discontinued     1,000 mg 200 mL/hr over 60 Minutes Intravenous Every 24 hours 02/01/19 1833 02/03/19 1018   02/02/19 0600  ceFEPIme (MAXIPIME) 2 g in sodium chloride 0.9 % 100 mL IVPB  Status:  Discontinued     2 g 200 mL/hr over 30 Minutes Intravenous Every 12 hours 02/01/19 1718 02/04/19 0950   02/01/19 1700  ceFEPIme (MAXIPIME) 2 g in sodium chloride 0.9 % 100 mL IVPB     2 g 200 mL/hr over 30 Minutes Intravenous  Once 02/01/19 1648 02/01/19 1801   02/01/19 1645  vancomycin (VANCOCIN) IVPB 1000 mg/200 mL premix     1,000 mg 200 mL/hr over 60 Minutes Intravenous  Once 02/01/19 1644 02/01/19 1903   02/01/19 1445  cefTRIAXone (ROCEPHIN) 1 g in sodium chloride 0.9 % 100 mL IVPB     1 g  200 mL/hr over 30 Minutes Intravenous  Once 02/01/19 1443 02/01/19 1533       Objective: Vitals:   02/12/19 0434 02/12/19 0743 02/12/19 1124 02/12/19 1622  BP: (!) 157/93 (!) 163/97 (!) 168/88 131/60  Pulse: 74 74 84 75  Resp: 19     Temp:  98.1 F (36.7 C) (!) 97.4 F (36.3 C) 97.7 F (36.5 C)  TempSrc:  Axillary Oral Oral  SpO2: 100% 100% 100% 100%  Weight: 53.1 kg     Height:        Intake/Output Summary (Last 24 hours) at 02/12/2019 1716 Last data filed at 02/12/2019 1400 Gross per 24 hour  Intake 1696.94 ml  Output 200 ml  Net 1496.94 ml   Filed Weights   02/07/19 2300 02/11/19 0503 02/12/19 0434  Weight: 82.7 kg 62.6 kg 53.1 kg     Examination: General exam: Appears comfortable  HEENT: PERRLA, oral mucosa moist, no sclera icterus or thrush Respiratory system: Clear to auscultation. Respiratory effort normal. Cardiovascular system: S1 & S2 heard, RRR.   Gastrointestinal system: Abdomen soft, non-tender, nondistended. Normal bowel sounds. Central nervous system: Alert and oriented only to person- No focal neurological deficits. Extremities: No cyanosis, clubbing or edema Skin: No rashes or ulcers Psychiatry:  Very confused    Data Reviewed: I have personally reviewed following labs and imaging studies  CBC: Recent Labs  Lab 02/07/19 0227 02/07/19 0227 02/08/19 0814 02/08/19 0814 02/09/19 0004 02/09/19 0113 02/10/19 0402 02/11/19 0318 02/12/19 0435  WBC 3.7*   < > 4.2   < > 3.3* 2.7* 4.2 4.5 4.3  NEUTROABS 2.3  --  2.7  --   --   --   --   --   --   HGB 8.4*   < > 8.6*   < > 8.2* 8.1* 8.7* 9.6* 8.9*  HCT 25.4*   < > 26.1*   < > 24.6* 24.3* 25.5* 28.8* 26.7*  MCV 100.4*   < > 100.8*   < > 100.0 98.0 97.7 99.0 98.9  PLT 81*   < > 72*   < > 104* 78* 152 243 233   < > = values in this interval not displayed.   Basic Metabolic Panel: Recent Labs  Lab 02/09/19 0004 02/09/19 0113 02/10/19 0402 02/11/19 0318 02/12/19 0435  NA 135 136 137 139 139  K 4.1 4.2 4.8 4.6 4.2  CL 106 107 107 109 106  CO2 22 21* 22 21* 24  GLUCOSE 112* 113* 118* 106* 107*  BUN 22 21 28* 26* 15  CREATININE 0.72 0.62 0.90 0.80 0.68  CALCIUM 9.4 8.9 9.1 9.1 9.4   GFR: Estimated Creatinine Clearance: 37.7 mL/min (by C-G formula based on SCr of 0.68 mg/dL). Liver Function Tests: Recent Labs  Lab 02/06/19 0559  AST 191*  ALT 184*  ALKPHOS 121  BILITOT 0.8  PROT 5.7*  ALBUMIN 2.3*   No results for input(s): LIPASE, AMYLASE in the last 168 hours. No results for input(s): AMMONIA in the last 168 hours. Coagulation Profile: No results for input(s): INR, PROTIME in the last 168 hours. Cardiac Enzymes: No results for  input(s): CKTOTAL, CKMB, CKMBINDEX, TROPONINI in the last 168 hours. BNP (last 3 results) No results for input(s): PROBNP in the last 8760 hours. HbA1C: No results for input(s): HGBA1C in the last 72 hours. CBG: Recent Labs  Lab 02/12/19 0012 02/12/19 0435 02/12/19 0741 02/12/19 1119 02/12/19 1554  GLUCAP 100* 105* 98 107* 151*   Lipid Profile:  No results for input(s): CHOL, HDL, LDLCALC, TRIG, CHOLHDL, LDLDIRECT in the last 72 hours. Thyroid Function Tests: No results for input(s): TSH, T4TOTAL, FREET4, T3FREE, THYROIDAB in the last 72 hours. Anemia Panel: No results for input(s): VITAMINB12, FOLATE, FERRITIN, TIBC, IRON, RETICCTPCT in the last 72 hours. Urine analysis:    Component Value Date/Time   COLORURINE YELLOW 02/09/2019 2300   APPEARANCEUR HAZY (A) 02/09/2019 2300   LABSPEC 1.021 02/09/2019 2300   PHURINE 5.0 02/09/2019 2300   GLUCOSEU NEGATIVE 02/09/2019 2300   HGBUR NEGATIVE 02/09/2019 2300   BILIRUBINUR NEGATIVE 02/09/2019 2300   KETONESUR NEGATIVE 02/09/2019 2300   PROTEINUR 30 (A) 02/09/2019 2300   NITRITE NEGATIVE 02/09/2019 2300   LEUKOCYTESUR NEGATIVE 02/09/2019 2300   Sepsis Labs: @LABRCNTIP (procalcitonin:4,lacticidven:4) ) Recent Results (from the past 240 hour(s))  Body fluid culture (indicate joint specimen source)     Status: None   Collection Time: 02/08/19  7:33 PM   Specimen: Synovium; Body Fluid  Result Value Ref Range Status   Specimen Description SYNOVIAL RIGHT KNEE  Final   Special Requests NONE  Final   Gram Stain   Final    ABUNDANT WBC PRESENT, PREDOMINANTLY PMN NO ORGANISMS SEEN    Culture   Final    NO GROWTH 3 DAYS Performed at [facility], [address] [place], [place] [address]    Report Status 02/12/2019 FINAL  Final  Culture, blood (routine x 2)     Status: None (Preliminary result)   Collection Time: 02/09/19 12:04 AM   Specimen: BLOOD  Result Value Ref Range Status   Specimen Description BLOOD LEFT ARM   Final   Special Requests   Final    BOTTLES DRAWN AEROBIC ONLY Blood Culture adequate volume   Culture   Final    NO GROWTH 3 DAYS Performed at [facility], [address], [place], [place] [address]    Report Status PENDING  Incomplete  Culture, blood (routine x 2)     Status: None (Preliminary result)   Collection Time: 02/09/19  1:14 AM   Specimen: BLOOD  Result Value Ref Range Status   Specimen Description BLOOD RIGHT HAND  Final   Special Requests   Final    BOTTLES DRAWN AEROBIC AND ANAEROBIC Blood Culture results may not be optimal due to an inadequate volume of blood received in culture bottles   Culture   Final    NO GROWTH 3 DAYS Performed at [facility], [place] [address] [place], [place] [address]    Report Status PENDING  Incomplete  Culture, Urine     Status: None   Collection Time: 02/09/19 10:59 PM   Specimen: Urine, Random  Result Value Ref Range Status   Specimen Description URINE, RANDOM  Final   Special Requests NONE  Final   Culture   Final    NO GROWTH Performed at [facility], [place] [address], [place], [place] [address]    Report Status 02/11/2019 FINAL  Final  SARS CORONAVIRUS 2 (TAT 6-24 HRS) Nasopharyngeal Nasopharyngeal Swab     Status: None   Collection Time: 02/11/19 11:07 AM   Specimen: Nasopharyngeal Swab  Result Value Ref Range Status   SARS Coronavirus 2 NEGATIVE NEGATIVE Final    Comment: (NOTE) SARS-CoV-2 target nucleic acids are NOT DETECTED. The SARS-CoV-2 RNA is generally detectable in upper and lower respiratory specimens during the acute phase of infection. Negative results do not preclude SARS-CoV-2 infection, do not rule  out co-infections with other pathogens, and should not be used as the sole basis for treatment or other patient management decisions. Negative results must be combined with clinical observations, patient history, and epidemiological information. The expected result is Negative. Fact Sheet for  Patients: SugarRoll.be Fact Sheet for Healthcare Providers: https://www.woods-mathews.com/ This test is not yet approved or cleared by the [place] FDA and  has been authorized for detection and/or diagnosis of SARS-CoV-2 by FDA under an Emergency Use Authorization (EUA). This EUA will remain  in effect (meaning this test can be used) for the duration of the COVID-19 declaration under Section 56 4(b)(1) of the Act, 21 U.S.C. section 360bbb-3(b)(1), unless the authorization is terminated or revoked sooner. Performed at [facility], [place] [address], [place], [place] [address]          Radiology Studies: No results found.    Scheduled Meds: . chlorhexidine  15 mL Mouth Rinse BID  . donepezil  5 mg Oral QHS  . feeding supplement (ENSURE ENLIVE)  237 mL Oral TID BM  . levETIRAcetam  500 mg Oral Q12H  . mouth rinse  15 mL Mouth Rinse q12n4p  . methylPREDNISolone acetate  80 mg Intra-articular Once  . multivitamin with minerals  1 tablet Oral Daily  . QUEtiapine  25 mg Oral QHS  . sertraline  25 mg Oral Daily   Continuous Infusions: . sodium chloride       LOS: 11 days      Debbe Odea, MD [facility] Pager: [email or web address] Password [facility] 02/12/2019, 5:16 PM

## 2019-02-12 NOTE — Progress Notes (Signed)
Physical Therapy Treatment Patient Details Name: Jocelyn Mccann MRN: [id number] DOB: 1928-04-30 Today's Date: 02/12/2019    History of Present Illness Jocelyn Mccann is a 86 y.o. female with PMH of breast cancer on treatment with anastrozole, dementia, recurrent syncope, presenting from home due to fall and AMS. In ED Pt was found neutropenic, hypoglycemic, hypothermic and hypotensive. Brain MRI shows subacute infarcts R thalamus, R thalamocapsular junction, R external capsule, and R cerebellum, MRI C-spine without acute process. Likely Pt with Multifocal toxic metabolic encephalopathy.    PT Comments    Patient received in bed, very pleasantly confused today, continues to alternate between [place] and [place] but able to follow commands in English well enough to participate. See below for mobility/assist levels. Balance improved at EOB today but did continue to require Min guard-MinA once relatively balance; perseverated on standing today. Made 3 attempts, all of which required totalAx2 however on last attempt almost able to make it to full upright! She was left in bed with all needs met, bed alarm active and mitts reapplied due to increased restlessness per RN request.     Follow Up Recommendations  Supervision/Assistance - 24 hour;SNF     Equipment Recommendations  Other (comment)(defer)    Recommendations for Other Services       Precautions / Restrictions Precautions Precautions: Fall;Other (comment) Precaution Comments: restless/pulling at lines; somewhat of a language barrier; painful R knee Restrictions Weight Bearing Restrictions: No    Mobility  Bed Mobility Overal bed mobility: Needs Assistance Bed Mobility: Supine to Sit;Sit to Supine     Supine to sit: Max assist;+2 for physical assistance;HOB elevated Sit to supine: Max assist;+2 for physical assistance   General bed mobility comments: MaxAx2 to pivot to EOB, once at EOB required Min guard-minA to maintain  upright  Transfers Overall transfer level: Needs assistance Equipment used: Rolling walker (2 wheeled) Transfers: Sit to/from Stand Sit to Stand: Total assist;+2 physical assistance         General transfer comment: totalAx2 to clear hips from bed; only able to make it 1/3 of the way up on first two attempts, able to get perhaps 2/3 of the way up on third attempt but unable to maintain due to weakness/reliance on external assist  Ambulation/Gait             General Gait Details: unable   Stairs             Wheelchair Mobility    Modified Rankin (Stroke Patients Only)       Balance Overall balance assessment: Needs assistance Sitting-balance support: Feet supported;Bilateral upper extremity supported Sitting balance-Leahy Scale: Poor Sitting balance - Comments: min guard-MinA for midline Postural control: Left lateral lean;Posterior lean Standing balance support: Bilateral upper extremity supported;During functional activity Standing balance-Leahy Scale: Zero                              Cognition Arousal/Alertness: Awake/alert Behavior During Therapy: Restless Overall Cognitive Status: No family/caregiver present to determine baseline cognitive functioning                                 General Comments: very pleasantly confused and chattering with therapist, very tangential. Poor safety awareness/poor awarness of deficits. Perseverated on standing after making attempt at standing at EOB. Got very restless at EOS.      Exercises  General Comments        Pertinent Vitals/Pain Pain Assessment: Faces Pain Score: 0-No pain Faces Pain Scale: No hurt Pain Intervention(s): Limited activity within patient's tolerance;Monitored during session    Home Living Family/patient expects to be discharged to:: Skilled nursing facility Living Arrangements: Children                  Prior Function            PT Goals  (current goals can now be found in the care plan section) Acute Rehab PT Goals Patient Stated Goal: none stated PT Goal Formulation: Patient unable to participate in goal setting Time For Goal Achievement: 02/20/19 Potential to Achieve Goals: Fair Progress towards PT goals: Progressing toward goals    Frequency    Min 2X/week      PT Plan Current plan remains appropriate    Co-evaluation              AM-PAC PT "6 Clicks" Mobility   Outcome Measure  Help needed turning from your back to your side while in a flat bed without using bedrails?: A Lot Help needed moving from lying on your back to sitting on the side of a flat bed without using bedrails?: A Lot Help needed moving to and from a bed to a chair (including a wheelchair)?: Total Help needed standing up from a chair using your arms (e.g., wheelchair or bedside chair)?: Total Help needed to walk in hospital room?: Total Help needed climbing 3-5 steps with a railing? : Total 6 Click Score: 8    End of Session Equipment Utilized During Treatment: Gait belt Activity Tolerance: Patient tolerated treatment well Patient left: in bed;with call bell/phone within reach;with bed alarm set;with restraints reapplied(mitts reapplied per RN request due to increased restlessness) Nurse Communication: Mobility status PT Visit Diagnosis: Muscle weakness (generalized) (M62.81);Difficulty in walking, not elsewhere classified (R26.2);Other abnormalities of gait and mobility (R26.89)     Time: 1660-6301 PT Time Calculation (min) (ACUTE ONLY): 20 min  Charges:  $Therapeutic Activity: 8-22 mins                     Windell Norfolk, DPT, PN1   Supplemental Physical Therapist [place]    Pager [phone] Acute Rehab Office [phone]

## 2019-02-13 ENCOUNTER — Ambulatory Visit: Payer: Self-pay

## 2019-02-13 ENCOUNTER — Other Ambulatory Visit: Payer: Self-pay

## 2019-02-13 DIAGNOSIS — G301 Alzheimer's disease with late onset: Secondary | ICD-10-CM | POA: Diagnosis not present

## 2019-02-13 DIAGNOSIS — I69398 Other sequelae of cerebral infarction: Secondary | ICD-10-CM | POA: Diagnosis not present

## 2019-02-13 DIAGNOSIS — T68XXXA Hypothermia, initial encounter: Secondary | ICD-10-CM | POA: Diagnosis not present

## 2019-02-13 DIAGNOSIS — Z8659 Personal history of other mental and behavioral disorders: Secondary | ICD-10-CM | POA: Diagnosis not present

## 2019-02-13 DIAGNOSIS — G40909 Epilepsy, unspecified, not intractable, without status epilepticus: Secondary | ICD-10-CM | POA: Diagnosis not present

## 2019-02-13 DIAGNOSIS — G932 Benign intracranial hypertension: Secondary | ICD-10-CM | POA: Diagnosis present

## 2019-02-13 DIAGNOSIS — R6520 Severe sepsis without septic shock: Secondary | ICD-10-CM

## 2019-02-13 DIAGNOSIS — J069 Acute upper respiratory infection, unspecified: Secondary | ICD-10-CM | POA: Diagnosis not present

## 2019-02-13 DIAGNOSIS — G309 Alzheimer's disease, unspecified: Secondary | ICD-10-CM | POA: Diagnosis not present

## 2019-02-13 DIAGNOSIS — Z66 Do not resuscitate: Secondary | ICD-10-CM | POA: Diagnosis not present

## 2019-02-13 DIAGNOSIS — R5383 Other fatigue: Secondary | ICD-10-CM | POA: Diagnosis not present

## 2019-02-13 DIAGNOSIS — F0280 Dementia in other diseases classified elsewhere without behavioral disturbance: Secondary | ICD-10-CM | POA: Diagnosis not present

## 2019-02-13 DIAGNOSIS — R410 Disorientation, unspecified: Secondary | ICD-10-CM | POA: Diagnosis not present

## 2019-02-13 DIAGNOSIS — Z9181 History of falling: Secondary | ICD-10-CM | POA: Diagnosis not present

## 2019-02-13 DIAGNOSIS — F411 Generalized anxiety disorder: Secondary | ICD-10-CM | POA: Diagnosis present

## 2019-02-13 DIAGNOSIS — R531 Weakness: Secondary | ICD-10-CM | POA: Diagnosis not present

## 2019-02-13 DIAGNOSIS — Z87440 Personal history of urinary (tract) infections: Secondary | ICD-10-CM | POA: Diagnosis not present

## 2019-02-13 DIAGNOSIS — Z7401 Bed confinement status: Secondary | ICD-10-CM | POA: Diagnosis not present

## 2019-02-13 DIAGNOSIS — M810 Age-related osteoporosis without current pathological fracture: Secondary | ICD-10-CM | POA: Diagnosis present

## 2019-02-13 DIAGNOSIS — Z7189 Other specified counseling: Secondary | ICD-10-CM | POA: Diagnosis not present

## 2019-02-13 DIAGNOSIS — K08409 Partial loss of teeth, unspecified cause, unspecified class: Secondary | ICD-10-CM | POA: Diagnosis present

## 2019-02-13 DIAGNOSIS — I69828 Other speech and language deficits following other cerebrovascular disease: Secondary | ICD-10-CM | POA: Diagnosis not present

## 2019-02-13 DIAGNOSIS — Z170 Estrogen receptor positive status [ER+]: Secondary | ICD-10-CM | POA: Diagnosis not present

## 2019-02-13 DIAGNOSIS — I69391 Dysphagia following cerebral infarction: Secondary | ICD-10-CM | POA: Diagnosis not present

## 2019-02-13 DIAGNOSIS — R2681 Unsteadiness on feet: Secondary | ICD-10-CM | POA: Diagnosis not present

## 2019-02-13 DIAGNOSIS — R296 Repeated falls: Secondary | ICD-10-CM | POA: Diagnosis present

## 2019-02-13 DIAGNOSIS — M6281 Muscle weakness (generalized): Secondary | ICD-10-CM | POA: Diagnosis not present

## 2019-02-13 DIAGNOSIS — L89612 Pressure ulcer of right heel, stage 2: Secondary | ICD-10-CM | POA: Diagnosis not present

## 2019-02-13 DIAGNOSIS — A419 Sepsis, unspecified organism: Secondary | ICD-10-CM | POA: Diagnosis not present

## 2019-02-13 DIAGNOSIS — W19XXXA Unspecified fall, initial encounter: Secondary | ICD-10-CM | POA: Diagnosis not present

## 2019-02-13 DIAGNOSIS — I499 Cardiac arrhythmia, unspecified: Secondary | ICD-10-CM | POA: Diagnosis not present

## 2019-02-13 DIAGNOSIS — I619 Nontraumatic intracerebral hemorrhage, unspecified: Secondary | ICD-10-CM | POA: Diagnosis not present

## 2019-02-13 DIAGNOSIS — I69328 Other speech and language deficits following cerebral infarction: Secondary | ICD-10-CM | POA: Diagnosis not present

## 2019-02-13 DIAGNOSIS — R404 Transient alteration of awareness: Secondary | ICD-10-CM | POA: Diagnosis not present

## 2019-02-13 DIAGNOSIS — C50919 Malignant neoplasm of unspecified site of unspecified female breast: Secondary | ICD-10-CM | POA: Diagnosis not present

## 2019-02-13 DIAGNOSIS — M1711 Unilateral primary osteoarthritis, right knee: Secondary | ICD-10-CM | POA: Diagnosis not present

## 2019-02-13 DIAGNOSIS — G9340 Encephalopathy, unspecified: Secondary | ICD-10-CM | POA: Diagnosis not present

## 2019-02-13 DIAGNOSIS — Z8673 Personal history of transient ischemic attack (TIA), and cerebral infarction without residual deficits: Secondary | ICD-10-CM | POA: Diagnosis not present

## 2019-02-13 DIAGNOSIS — Z8619 Personal history of other infectious and parasitic diseases: Secondary | ICD-10-CM | POA: Diagnosis not present

## 2019-02-13 DIAGNOSIS — Z03818 Encounter for observation for suspected exposure to other biological agents ruled out: Secondary | ICD-10-CM | POA: Diagnosis not present

## 2019-02-13 DIAGNOSIS — I615 Nontraumatic intracerebral hemorrhage, intraventricular: Secondary | ICD-10-CM | POA: Diagnosis not present

## 2019-02-13 DIAGNOSIS — G914 Hydrocephalus in diseases classified elsewhere: Secondary | ICD-10-CM | POA: Diagnosis not present

## 2019-02-13 DIAGNOSIS — R569 Unspecified convulsions: Secondary | ICD-10-CM | POA: Diagnosis not present

## 2019-02-13 DIAGNOSIS — G9341 Metabolic encephalopathy: Secondary | ICD-10-CM | POA: Diagnosis not present

## 2019-02-13 DIAGNOSIS — N390 Urinary tract infection, site not specified: Secondary | ICD-10-CM | POA: Diagnosis not present

## 2019-02-13 DIAGNOSIS — R4182 Altered mental status, unspecified: Secondary | ICD-10-CM | POA: Diagnosis not present

## 2019-02-13 DIAGNOSIS — J189 Pneumonia, unspecified organism: Secondary | ICD-10-CM | POA: Diagnosis not present

## 2019-02-13 DIAGNOSIS — I639 Cerebral infarction, unspecified: Secondary | ICD-10-CM | POA: Diagnosis not present

## 2019-02-13 DIAGNOSIS — M25561 Pain in right knee: Secondary | ICD-10-CM | POA: Diagnosis not present

## 2019-02-13 DIAGNOSIS — G9349 Other encephalopathy: Secondary | ICD-10-CM | POA: Diagnosis not present

## 2019-02-13 DIAGNOSIS — Z515 Encounter for palliative care: Secondary | ICD-10-CM | POA: Diagnosis not present

## 2019-02-13 DIAGNOSIS — D649 Anemia, unspecified: Secondary | ICD-10-CM | POA: Diagnosis not present

## 2019-02-13 DIAGNOSIS — D61818 Other pancytopenia: Secondary | ICD-10-CM | POA: Diagnosis not present

## 2019-02-13 DIAGNOSIS — Z20822 Contact with and (suspected) exposure to covid-19: Secondary | ICD-10-CM | POA: Diagnosis not present

## 2019-02-13 DIAGNOSIS — C50411 Malignant neoplasm of upper-outer quadrant of right female breast: Secondary | ICD-10-CM | POA: Diagnosis not present

## 2019-02-13 DIAGNOSIS — B961 Klebsiella pneumoniae [K. pneumoniae] as the cause of diseases classified elsewhere: Secondary | ICD-10-CM | POA: Diagnosis present

## 2019-02-13 DIAGNOSIS — I10 Essential (primary) hypertension: Secondary | ICD-10-CM | POA: Diagnosis not present

## 2019-02-13 DIAGNOSIS — M2550 Pain in unspecified joint: Secondary | ICD-10-CM | POA: Diagnosis not present

## 2019-02-13 DIAGNOSIS — R1312 Dysphagia, oropharyngeal phase: Secondary | ICD-10-CM | POA: Diagnosis not present

## 2019-02-13 DIAGNOSIS — N3090 Cystitis, unspecified without hematuria: Secondary | ICD-10-CM | POA: Diagnosis not present

## 2019-02-13 DIAGNOSIS — I629 Nontraumatic intracranial hemorrhage, unspecified: Secondary | ICD-10-CM | POA: Diagnosis not present

## 2019-02-13 DIAGNOSIS — E162 Hypoglycemia, unspecified: Secondary | ICD-10-CM | POA: Diagnosis not present

## 2019-02-13 DIAGNOSIS — E876 Hypokalemia: Secondary | ICD-10-CM | POA: Diagnosis present

## 2019-02-13 LAB — CBC
HCT: 25.4 % — ABNORMAL LOW (ref 36.0–46.0)
Hemoglobin: 8.3 g/dL — ABNORMAL LOW (ref 12.0–15.0)
MCH: 32.5 pg (ref 26.0–34.0)
MCHC: 32.7 g/dL (ref 30.0–36.0)
MCV: 99.6 fL (ref 80.0–100.0)
Platelets: 242 10*3/uL (ref 150–400)
RBC: 2.55 MIL/uL — ABNORMAL LOW (ref 3.87–5.11)
RDW: 16.4 % — ABNORMAL HIGH (ref 11.5–15.5)
WBC: 5.1 10*3/uL (ref 4.0–10.5)
nRBC: 0 % (ref 0.0–0.2)

## 2019-02-13 LAB — BASIC METABOLIC PANEL
Anion gap: 8 (ref 5–15)
BUN: 15 mg/dL (ref 8–23)
CO2: 24 mmol/L (ref 22–32)
Calcium: 9 mg/dL (ref 8.9–10.3)
Chloride: 106 mmol/L (ref 98–111)
Creatinine, Ser: 0.71 mg/dL (ref 0.44–1.00)
GFR calc Af Amer: >60 mL/min (ref >60)
GFR calc non Af Amer: >60 mL/min (ref >60)
Glucose, Bld: 110 mg/dL — ABNORMAL HIGH (ref 70–99)
Potassium: 4.4 mmol/L (ref 3.5–5.1)
Sodium: 138 mmol/L (ref 135–145)

## 2019-02-13 MED ORDER — ACETAMINOPHEN 325 MG PO TABS: 650.0000 mg | ORAL_TABLET | Freq: Four times a day (QID) | ORAL | Status: DC | PRN

## 2019-02-13 MED ORDER — ADULT MULTIVITAMIN W/MINERALS CH: 1.0000 | ORAL_TABLET | Freq: Every day | ORAL | Status: AC

## 2019-02-13 MED ORDER — ASPIRIN EC 81 MG PO TBEC: 81.0000 mg | DELAYED_RELEASE_TABLET | Freq: Every day | ORAL | 2 refills | Status: DC

## 2019-02-13 MED ORDER — ENSURE ENLIVE PO LIQD: 237.0000 mL | Freq: Three times a day (TID) | ORAL | 12 refills | Status: DC

## 2019-02-13 MED ORDER — LEVETIRACETAM 250 MG PO TABS: 50.0000 mg | ORAL_TABLET | Freq: Two times a day (BID) | ORAL | 5 refills | Status: DC

## 2019-02-13 MED ORDER — LEVETIRACETAM 500 MG PO TABS: 500.0000 mg | ORAL_TABLET | Freq: Two times a day (BID) | ORAL | Status: DC

## 2019-02-13 NOTE — Patient Outreach (Signed)
[facility]) Care Management  02/13/2019  SHANEAKA PULK 1928/08/13 [id number]   Patient hospitalized greater than 10 days and also discharge to SNF.    Plan: RN CM will close case.    Jone Baseman, RN, MSN [place] Management Care Management Coordinator Direct Line [phone] Cell [phone] Toll Free: [phone]  Fax: [phone]

## 2019-02-13 NOTE — Discharge Summary (Addendum)
Physician Discharge Summary  Jocelyn Mccann [id number] DOB: Dec 20, 1928 DOA: 02/01/2019  PCP: Alycia Rossetti, MD  Admit date: 02/01/2019 Discharge date: 02/13/2019  Admitted From: home Disposition:  SNF   Recommendations for Outpatient Follow-up:  1. F/u with PCP in 2 wks 2. Prevalon boot for right heel pressure ulcer  Discharge Condition:  stable   CODE STATUS:  Full code   Diet recommendation:  Heart healthy Consultations:  Neuro  PCCM   Ortho  Palliative care   Discharge Diagnoses:  Principal Problem:   Acute encephalopathy Active Problems:   HCAP (healthcare-associated pneumonia)-  Sepsis ([place])  Hypoglycemia without diagnosis of diabetes mellitus   Alzheimer disease ([place])   Seizures ([place])   Pressure injury of skin   Brief Summary: Jocelyn Mccann is a 86 y.o.femalewith PMH of breast cancer on treatment with anastrozole, followed by Dr. Jana Hakim, h/o SDH, Alzheimers dementia followed by Neurology, with recurrent syncope, presenting from home due to fall.   The patient had been seen at the ED on 1/20 for a fall at home with laceration repair aboe the left eyebrown but had another unwitnessed fall at home.  The family had noted worsening in her baseline confusion and increased weakness.  In ED> Temp 87 degrees, WBC 1.1. started on antibiotics and admitted.   1/22> patient obtunded CBG 23, SBP 60. Appeared to have a seizure. Intubated for airway protection. Neuro consulted.  MRI brain 1/23  > Punctate acute/early subacute infarcts within the right thalamus, right thalamocapsular junction, right external capsule and right Cerebellum.  Treated for septic shock due to a UTI.  Extubated and transferred back to [facility] on 1/27.   1/28 night> temp 91 and obtunded- suspected to have HCAP- CXR>  Worsening airspace disease throughout the mid to lower lungs bilaterally with small bilateral pleural effusions. Findings are concerning for multilobar bilateral  pneumonia.  She was started on Vanc and Cefepime. CT head showed a new basal ganglia infarct but MRI did not show this finding. Antibiotics were continued x 1 wk. She eventually regained her baseline mental status.  I took over as attending on 02/12/19   Hospital Course:  Principal Problem:   Acute encephalopathy superimposed on severe Alzheimer's Dementia - due to UTI followed by HCAP   -quite alert now but quite confused -cont Aricept and Seroquel  Active Problems:  Sepsis present on admission with Hypoglycemia/ hypotension/ hypothermia/ pancytopenia due to a UTI ([place])- followed by  HCAP - received 5 days of Ceftriaxone for a UTI - subsequently received 7 days of Vanc and Cefepime for HCAP which were completed on 2/1 - pancytopenia resoved  Seizure on 1/22 in setting of Hypoglycemia/ hypotension/ hypothermia with a prior h/o seizure disorder - EEG on 1/22> severe diffuse encephalopathy - Keppra increased from 250 mg BID to 500 mg BID by neuro  ? CVA - noted on MRI on 1/23 - neuro recommended supportive care- due to thrombocytopenia at that time, Aspirin not started - can ASA 81 mg today  Right heel stage 2 pressure injury - cont Prevalon boot     Hypoglycemia without diagnosis of diabetes mellitus - ? Due to sepsis- resolved   Left knee swelling and pain - treated for arthritis with intra-articular depo medrol by Ortho on 1/28  Breast cancer - cont Anastrazole     Discharge Exam: Vitals:   02/13/19 0630 02/13/19 0806  BP:  (!) 157/84  Pulse:  87  Resp:  17  Temp: 97.7 F (36.5 C)  SpO2:     Vitals:   02/13/19 0601 02/13/19 0602 02/13/19 0630 02/13/19 0806  BP:  (!) 156/81  (!) 157/84  Pulse:    87  Resp:  16  17  Temp:   97.7 F (36.5 C)   TempSrc:   Axillary   SpO2:      Weight: 54 kg     Height:        General: Pt is alert, awake and oriented only to self, not in acute distress Cardiovascular: RRR, S1/S2 +, no rubs, no  gallops Respiratory: CTA bilaterally, no wheezing, no rhonchi Abdominal: Soft, NT, ND, bowel sounds + Extremities: no edema, no cyanosis   Discharge Instructions  Discharge Instructions    Diet - low sodium heart healthy   Complete by: As directed    Increase activity slowly   Complete by: As directed      Allergies as of 02/13/2019      Reactions   Milk-related Compounds Diarrhea   Pork-derived Products Other (See Comments)   PATIENT DOES NOT EAT PORK      Medication List    TAKE these medications   acetaminophen 325 MG tablet Commonly known as: TYLENOL Take 2 tablets (650 mg total) by mouth every 6 (six) hours as needed for mild pain (or Fever >/= 101).   anastrozole 1 MG tablet Commonly known as: ARIMIDEX Take 1 tablet (1 mg total) by mouth daily.   aspirin EC 81 MG tablet Take 1 tablet (81 mg total) by mouth daily.   cholecalciferol 25 MCG (1000 UNIT) tablet Commonly known as: VITAMIN D3 Take 1 tablet (1,000 Units total) by mouth daily.   donepezil 5 MG tablet Commonly known as: ARICEPT TAKE 1 TABLET (5 MG TOTAL) BY MOUTH AT BEDTIME. FOR ALZHEIMER'S DISEASE What changed: when to take this   feeding supplement (ENSURE ENLIVE) Liqd Take 237 mLs by mouth 3 (three) times daily between meals.   levETIRAcetam 500 MG tablet Commonly known as: KEPPRA Take 1 tablet (500 mg total) by mouth 2 (two) times daily. What changed:   medication strength  how much to take   MELATONIN PO Take 1 tablet by mouth at bedtime as needed (for sleep).   multivitamin with minerals Tabs tablet Take 1 tablet by mouth daily. Start taking on: February 14, 2019   NyQuil Severe Cold/Flu 5-6.25-10-325 MG/15ML Liqd Generic drug: Phenyleph-Doxylamine-DM-APAP Take 5-10 mLs by mouth at bedtime as needed (for rhinitis).   QUEtiapine 25 MG tablet Commonly known as: SEROQUEL TAKE 1 TABLET BY MOUTH EVERYDAY AT BEDTIME What changed: See the new instructions.   sertraline 25 MG  tablet Commonly known as: ZOLOFT Take 1 tablet (25 mg total) by mouth daily.   traMADol 50 MG tablet Commonly known as: ULTRAM Take 1 tablet (50 mg total) by mouth every 6 (six) hours as needed for moderate pain.   Voltaren 1 % Gel Generic drug: diclofenac Sodium Apply 2-4 g topically See admin instructions. Apply 2-4 grams to both knees once daily for pain       Allergies  Allergen Reactions  . Milk-Related Compounds Diarrhea  . Pork-Derived Products Other (See Comments)    PATIENT DOES NOT EAT PORK     Procedures/Studies: Intubation/Extubation Intra-articular steroid injection EEG  DG Chest 1 View  Result Date: 01/26/2019 CLINICAL DATA:  Fall EXAM: CHEST  1 VIEW COMPARISON:  November 29, 2018 FINDINGS: The heart size and mediastinal contours are within normal limits. Aortic knob calcifications. Both lungs are clear. Surgical  clips overlying the right chest wall. No acute osseous abnormality. IMPRESSION: No active disease. Electronically Signed   By: Prudencio Pair M.D.   On: 01/26/2019 04:51   DG Pelvis 1-2 Views  Result Date: 01/26/2019 CLINICAL DATA:  Fall, right leg pain. EXAM: PELVIS - 1-2 VIEW COMPARISON:  None. FINDINGS: There is diffuse osteopenia which somewhat limits evaluation. No definite fracture seen. Moderate bilateral hip osteoarthritis is seen. Degenerative changes in the lower lumbar spine. IMPRESSION: No acute osseous abnormality. Electronically Signed   By: Prudencio Pair M.D.   On: 01/26/2019 04:52   DG Shoulder Right  Result Date: 01/26/2019 CLINICAL DATA:  Fall with pain EXAM: RIGHT SHOULDER - 2+ VIEW COMPARISON:  None. FINDINGS: No acute fracture or dislocation. High-riding humeral head compatible with chronic rotator cuff tear. Prominent osteopenia. Degenerative spurring at the glenohumeral joint and lateral spurring at the acromion. IMPRESSION: 1. No fracture or dislocation. 2. Chronic findings are described above. Electronically Signed   By: Monte Fantasia M.D.   On: 01/26/2019 04:47   DG Knee 1-2 Views Right  Result Date: 02/07/2019 CLINICAL DATA:  Recurrent falls over the past 2 weeks. Right knee pain, swelling and tenderness. Initial encounter. EXAM: RIGHT KNEE - 1-2 VIEW COMPARISON:  Plain films right knee 01/26/2019. FINDINGS: No fracture or dislocation is identified. The patient has a large joint effusion which is new since the prior examination. Degenerative change about the knee is severe in the lateral compartment. No chondrocalcinosis. IMPRESSION: Large knee joint effusion is new since the prior examination. Negative for fracture. Advanced osteoarthritis is worst in the lateral compartment. Electronically Signed   By: Inge Rise M.D.   On: 02/07/2019 12:29   CT HEAD WO CONTRAST  Result Date: 02/09/2019 CLINICAL DATA:  Encephalopathy, acute mental status change, nonresponsive, history dementia, breast cancer, seizures EXAM: CT HEAD WITHOUT CONTRAST TECHNIQUE: Contiguous axial images were obtained from the base of the skull through the vertex without intravenous contrast. Sagittal and coronal MPR images reconstructed from axial data set. COMPARISON:  02/02/2019 FINDINGS: Brain: Generalized atrophy. Normal ventricular morphology. No midline shift or mass effect. Small vessel chronic ischemic changes of deep cerebral white matter. Small old BILATERAL basal ganglia and LEFT caudate lacunar infarcts. RIGHT basal ganglia appears diffusely slightly lower in attenuation than LEFT, new since prior exam, consistent with subtle new infarct. No intracranial hemorrhage, mass lesion, or acute cortical infarction. No extra-axial fluid collections. Vascular: Small hyperdense focus at RIGHT sylvian fissure likely a focus of thrombus within a sylvian branch of the RIGHT MCA. Skull: Intact Sinuses/Orbits: Clear Other: N/A IMPRESSION: Atrophy with small vessel chronic ischemic changes of deep cerebral white matter. Small old BILATERAL basal ganglia and LEFT  caudate lacunar infarcts. New RIGHT basal ganglia infarct. Small focus of thrombus within a branch of the RIGHT MCA in the RIGHT sylvian fissure. Findings called to Dr. Nani Ravens on 02/09/2019 at 1242 hours. Electronically Signed   By: Lavonia Dana M.D.   On: 02/09/2019 12:43   CT HEAD WO CONTRAST  Result Date: 02/02/2019 CLINICAL DATA:  Encephalopathy. EXAM: CT HEAD WITHOUT CONTRAST TECHNIQUE: Contiguous axial images were obtained from the base of the skull through the vertex without intravenous contrast. COMPARISON:  Head CT yesterday, also 01/31/2019 FINDINGS: Brain: No intracranial hemorrhage, mass effect, or midline shift. Stable atrophy and chronic small vessel ischemia. No hydrocephalus. The basilar cisterns are patent. No evidence of territorial infarct or acute ischemia. No extra-axial or intracranial fluid collection. Vascular: Atherosclerosis of skullbase vasculature without  hyperdense vessel or abnormal calcification. Skull: No fracture or focal lesion. Sinuses/Orbits: New endotracheal and nasogastric tube. Other: Right frontal scalp hematoma IMPRESSION: Right frontal scalp hematoma. No acute intracranial abnormality. Stable atrophy and chronic small vessel ischemia. Electronically Signed   By: Keith Rake M.D.   On: 02/02/2019 02:35   CT Head Wo Contrast  Result Date: 02/01/2019 CLINICAL DATA:  Head and neck injury. EXAM: CT HEAD WITHOUT CONTRAST CT CERVICAL SPINE WITHOUT CONTRAST TECHNIQUE: Multidetector CT imaging of the head and cervical spine was performed following the standard protocol without intravenous contrast. Multiplanar CT image reconstructions of the cervical spine were also generated. COMPARISON:  Same examinations yesterday. FINDINGS: CT HEAD FINDINGS Brain: Generalized atrophy, with some temporal lobe predominance. No sign of acute infarction of the brainstem or cerebellum. Chronic small-vessel ischemic changes of the cerebral hemispheric white matter. Old small vessel basal  ganglia infarctions. No mass lesion, hemorrhage, hydrocephalus or extra-axial collection. Vascular: There is atherosclerotic calcification of the major vessels at the base of the brain. Skull: Negative.  No skull fracture. Sinuses/Orbits: Clear/normal Other: None CT CERVICAL SPINE FINDINGS Alignment: No traumatic malalignment. Skull base and vertebrae: No fracture in the cervical or upper thoracic region. Soft tissues and spinal canal: Negative Disc levels: Chronic degenerative spondylosis from C2-3 through C6-7. Facet osteoarthritis on the right at C2-3, C3-4 and C4-5 and on the left at C3-4, C4-5 and C7-T1. Upper chest: Negative Other: None IMPRESSION: No change since yesterday. Head CT: No acute or traumatic finding. Advanced atrophy, temporal lobe predominant. Chronic small-vessel changes. Cervical spine CT: No acute or traumatic finding. Chronic degenerative cervical spondylosis and facet osteoarthritis. Electronically Signed   By: Nelson Chimes M.D.   On: 02/01/2019 16:33   CT HEAD WO CONTRAST  Result Date: 01/31/2019 CLINICAL DATA:  Fall. EXAM: CT HEAD WITHOUT CONTRAST CT CERVICAL SPINE WITHOUT CONTRAST TECHNIQUE: Multidetector CT imaging of the head and cervical spine was performed following the standard protocol without intravenous contrast. Multiplanar CT image reconstructions of the cervical spine were also generated. COMPARISON:  Head, maxillofacial, and cervical spine CTs 01/26/2019 FINDINGS: CT HEAD FINDINGS Brain: There is no evidence of acute infarct, intracranial hemorrhage, mass, midline shift, or extra-axial fluid collection. Hypodensities in the cerebral white matter bilaterally are unchanged and nonspecific but compatible with moderate chronic small vessel ischemic disease. 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 There is severe bilateral mesial temporal lobe atrophy. Vascular: Calcified atherosclerosis at the skull base. No hyperdense vessel. Skull: No fracture or suspicious  osseous lesion. Sinuses/Orbits: Visualized paranasal sinuses and mastoid air cells are clear intact globes. Moderate-sized hematoma superior and lateral to the left orbit. Other: None. CT CERVICAL SPINE FINDINGS Alignment: Straightening of the normal cervical lordosis with unchanged minimal anterolisthesis of C3 on C4. Skull base and vertebrae: No acute fracture identified within limitations of mild motion artifact. Soft tissues and spinal canal: No prevertebral fluid or swelling. No visible canal hematoma. Disc levels: Unchanged cervical disc and facet degeneration with moderate right neural foraminal stenosis at C6-7 due to uncovertebral and facet spurring. Upper chest: Clear lung apices. Other: Mild diffuse thyroid heterogeneity without a dominant nodule. IMPRESSION: 1. No evidence of acute intracranial abnormality. 2. Moderate chronic small vessel ischemic disease. 3. Left periorbital hematoma. 4. No acute fracture or traumatic subluxation identified in the cervical spine. Electronically Signed   By: Logan Bores M.D.   On: 01/31/2019 07:37   CT Head Wo Contrast  Result Date: 01/26/2019 CLINICAL DATA:  Fall several days  ago EXAM: CT MAXILLOFACIAL WITHOUT CONTRAST; CT CERVICAL SPINE WITHOUT CONTRAST; CT HEAD WITHOUT CONTRAST TECHNIQUE: Multidetector CT imaging of the maxillofacial structures was performed. Multiplanar CT image reconstructions were also generated. COMPARISON:  November 29, 2018 FINDINGS: Brain: No evidence of acute territorial infarction, hemorrhage, hydrocephalus,extra-axial collection or mass lesion/mass effect. There is dilatation the ventricles and sulci consistent with age-related atrophy. Low-attenuation changes in the deep white matter consistent with small vessel ischemia. Vascular: No hyperdense vessel or unexpected calcification. Skull: The skull is intact. No fracture or focal lesion identified. Sinuses/Orbits: The visualized paranasal sinuses and mastoid air cells are clear. The  orbits and globes intact. Other: None Face: Osseous: No acute fracture or other significant osseous abnormality.The nasal bone, mandibles, zygomatic arches and pterygoid plates are intact. Orbits: No fracture identified. Unremarkable appearance of globes and orbits. Sinuses: The visualized paranasal sinuses and mastoid air cells are unremarkable. Soft tissues:  Right periorbital soft tissue swelling is seen. Limited intracranial: No acute findings. Cervical spine: Alignment: Straightening of the normal cervical lordosis.12 Skull base and vertebrae: Visualized skull base is intact. No atlanto-occipital dissociation. The vertebral body heights are well maintained. No fracture or pathologic osseous lesion seen. Soft tissues and spinal canal: The visualized paraspinal soft tissues are unremarkable. No prevertebral soft tissue swelling is seen. The spinal canal is grossly unremarkable, no large epidural collection or significant canal narrowing. Disc levels: Multilevel cervical spine spondylosis is seen with disc height loss, uncovertebral osteophytes and disc osteophyte complex most notable from C4 through C6. Upper chest: The lung apices are clear. Thoracic inlet is within normal limits. Other: None IMPRESSION: No acute intracranial abnormality. Findings consistent with age related atrophy and chronic small vessel ischemia No acute facial fracture Right periorbital soft tissue swelling. No acute fracture or malalignment of the spine. Electronically Signed   By: Prudencio Pair M.D.   On: 01/26/2019 05:44   CT Cervical Spine Wo Contrast  Result Date: 02/01/2019 CLINICAL DATA:  Head and neck injury. EXAM: CT HEAD WITHOUT CONTRAST CT CERVICAL SPINE WITHOUT CONTRAST TECHNIQUE: Multidetector CT imaging of the head and cervical spine was performed following the standard protocol without intravenous contrast. Multiplanar CT image reconstructions of the cervical spine were also generated. COMPARISON:  Same examinations  yesterday. FINDINGS: CT HEAD FINDINGS Brain: Generalized atrophy, with some temporal lobe predominance. No sign of acute infarction of the brainstem or cerebellum. Chronic small-vessel ischemic changes of the cerebral hemispheric white matter. Old small vessel basal ganglia infarctions. No mass lesion, hemorrhage, hydrocephalus or extra-axial collection. Vascular: There is atherosclerotic calcification of the major vessels at the base of the brain. Skull: Negative.  No skull fracture. Sinuses/Orbits: Clear/normal Other: None CT CERVICAL SPINE FINDINGS Alignment: No traumatic malalignment. Skull base and vertebrae: No fracture in the cervical or upper thoracic region. Soft tissues and spinal canal: Negative Disc levels: Chronic degenerative spondylosis from C2-3 through C6-7. Facet osteoarthritis on the right at C2-3, C3-4 and C4-5 and on the left at C3-4, C4-5 and C7-T1. Upper chest: Negative Other: None IMPRESSION: No change since yesterday. Head CT: No acute or traumatic finding. Advanced atrophy, temporal lobe predominant. Chronic small-vessel changes. Cervical spine CT: No acute or traumatic finding. Chronic degenerative cervical spondylosis and facet osteoarthritis. Electronically Signed   By: Nelson Chimes M.D.   On: 02/01/2019 16:33   CT CERVICAL SPINE WO CONTRAST  Result Date: 01/31/2019 CLINICAL DATA:  Fall. EXAM: CT HEAD WITHOUT CONTRAST CT CERVICAL SPINE WITHOUT CONTRAST TECHNIQUE: Multidetector CT imaging of the head and cervical  spine was performed following the standard protocol without intravenous contrast. Multiplanar CT image reconstructions of the cervical spine were also generated. COMPARISON:  Head, maxillofacial, and cervical spine CTs 01/26/2019 FINDINGS: CT HEAD FINDINGS Brain: There is no evidence of acute infarct, intracranial hemorrhage, mass, midline shift, or extra-axial fluid collection. Hypodensities in the cerebral white matter bilaterally are unchanged and nonspecific but  compatible with moderate chronic small vessel ischemic disease. 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 There is severe bilateral mesial temporal lobe atrophy. Vascular: Calcified atherosclerosis at the skull base. No hyperdense vessel. Skull: No fracture or suspicious osseous lesion. Sinuses/Orbits: Visualized paranasal sinuses and mastoid air cells are clear intact globes. Moderate-sized hematoma superior and lateral to the left orbit. Other: None. CT CERVICAL SPINE FINDINGS Alignment: Straightening of the normal cervical lordosis with unchanged minimal anterolisthesis of C3 on C4. Skull base and vertebrae: No acute fracture identified within limitations of mild motion artifact. Soft tissues and spinal canal: No prevertebral fluid or swelling. No visible canal hematoma. Disc levels: Unchanged cervical disc and facet degeneration with moderate right neural foraminal stenosis at C6-7 due to uncovertebral and facet spurring. Upper chest: Clear lung apices. Other: Mild diffuse thyroid heterogeneity without a dominant nodule. IMPRESSION: 1. No evidence of acute intracranial abnormality. 2. Moderate chronic small vessel ischemic disease. 3. Left periorbital hematoma. 4. No acute fracture or traumatic subluxation identified in the cervical spine. Electronically Signed   By: Logan Bores M.D.   On: 01/31/2019 07:37   CT Cervical Spine Wo Contrast  Result Date: 01/26/2019 CLINICAL DATA:  Fall several days ago EXAM: CT MAXILLOFACIAL WITHOUT CONTRAST; CT CERVICAL SPINE WITHOUT CONTRAST; CT HEAD WITHOUT CONTRAST TECHNIQUE: Multidetector CT imaging of the maxillofacial structures was performed. Multiplanar CT image reconstructions were also generated. COMPARISON:  November 29, 2018 FINDINGS: Brain: No evidence of acute territorial infarction, hemorrhage, hydrocephalus,extra-axial collection or mass lesion/mass effect. There is dilatation the ventricles and sulci consistent with age-related atrophy.  Low-attenuation changes in the deep white matter consistent with small vessel ischemia. Vascular: No hyperdense vessel or unexpected calcification. Skull: The skull is intact. No fracture or focal lesion identified. Sinuses/Orbits: The visualized paranasal sinuses and mastoid air cells are clear. The orbits and globes intact. Other: None Face: Osseous: No acute fracture or other significant osseous abnormality.The nasal bone, mandibles, zygomatic arches and pterygoid plates are intact. Orbits: No fracture identified. Unremarkable appearance of globes and orbits. Sinuses: The visualized paranasal sinuses and mastoid air cells are unremarkable. Soft tissues:  Right periorbital soft tissue swelling is seen. Limited intracranial: No acute findings. Cervical spine: Alignment: Straightening of the normal cervical lordosis.12 Skull base and vertebrae: Visualized skull base is intact. No atlanto-occipital dissociation. The vertebral body heights are well maintained. No fracture or pathologic osseous lesion seen. Soft tissues and spinal canal: The visualized paraspinal soft tissues are unremarkable. No prevertebral soft tissue swelling is seen. The spinal canal is grossly unremarkable, no large epidural collection or significant canal narrowing. Disc levels: Multilevel cervical spine spondylosis is seen with disc height loss, uncovertebral osteophytes and disc osteophyte complex most notable from C4 through C6. Upper chest: The lung apices are clear. Thoracic inlet is within normal limits. Other: None IMPRESSION: No acute intracranial abnormality. Findings consistent with age related atrophy and chronic small vessel ischemia No acute facial fracture Right periorbital soft tissue swelling. No acute fracture or malalignment of the spine. Electronically Signed   By: Prudencio Pair M.D.   On: 01/26/2019 05:44   MR BRAIN WO  CONTRAST  Result Date: 02/09/2019 CLINICAL DATA:  86 year old female with altered mental status,  encephalopathy. Recent acute thalamic, deep white matter capsule and right cerebellar infarcts. EXAM: MRI HEAD WITHOUT CONTRAST TECHNIQUE: Multiplanar, multiecho pulse sequences of the brain and surrounding structures were obtained without intravenous contrast. COMPARISON:  Brain MRI 02/03/2019 and earlier. Head CT earlier today. FINDINGS: Study is moderately degraded by motion artifact despite repeated imaging attempts. Brain: Diffusion-weighted imaging is degraded despite repeated imaging attempts. No definite new restricted diffusion or new infarct is identified. Stable gray and white matter signal throughout the brain. No midline shift, mass effect, evidence of mass lesion, ventriculomegaly, extra-axial collection or acute intracranial hemorrhage. Cervicomedullary junction and pituitary are within normal limits. Vascular: Major intracranial vascular flow voids appear stable from the recent MRI. Skull and upper cervical spine: Cervical spine degraded by motion. Visible bone marrow signal appears within normal limits. Sinuses/Orbits: Stable, negative orbits. Resolved left maxillary sinus mucosal thickening and fluid in the pharynx. Other: Stable trace left mastoid fluid. IMPRESSION: 1. Motion degraded exam despite repeated imaging attempts. 2. No new intracranial abnormality is identified since the MRI 6 days earlier. Electronically Signed   By: Genevie Ann M.D.   On: 02/09/2019 20:17   MR BRAIN WO CONTRAST  Result Date: 02/03/2019 CLINICAL DATA:  Encephalopathy. Encephalopathy and myelopathy, acute or progressive. EXAM: MRI HEAD WITHOUT CONTRAST MRI CERVICAL SPINE WITHOUT CONTRAST TECHNIQUE: Multiplanar, multiecho pulse sequences of the brain and surrounding structures, and cervical spine, to include the craniocervical junction and cervicothoracic junction, were obtained without intravenous contrast. COMPARISON:  Head CT 02/02/2019, cervical spine CT 02/01/2019 FINDINGS: MRI HEAD FINDING: Brain: Intermittent  motion degradation. There are punctate foci of restricted diffusion (3 total) within the right basal ganglia, right thalamus, right thalamocapsular junction and right external capsule (series 5, image 78). These foci are too small to characterize on the ADC map but are suspicious for acute/subacute infarcts. Additional punctate acute/subacute infarct within the right cerebellum (series 5, image 68). There is no evidence of intracranial mass. No midline shift or extra-axial fluid collection. There are numerous microhemorrhages within the cerebellum, thalami and cerebral hemispheres. The central and posterior fossa microhemorrhages. Small volume chronic subarachnoid blood products along the right parietal lobe. Chronic lacunar infarcts within the left caudate and left cerebellum. Background mild chronic small vessel ischemic disease. Moderate generalized parenchymal atrophy. Vascular: Flow voids maintained within the proximal large arterial vessels. Skull and upper cervical spine: No focal calvarial marrow lesion. Cervical spine reported below. Sinuses/Orbits: Visualized orbits demonstrate no acute abnormality. Mild scattered paranasal sinus mucosal thickening. Trace fluid within left mastoid air cells. MRI CERVICAL SPINE FINDINGS Multiple sequences are mild to moderately motion degraded, limiting evaluation. Alignment: Straightening of the expected cervical lordosis. No significant spondylolisthesis. Vertebrae: Vertebral body height is maintained. No marrow edema or suspicious osseous lesion. Multilevel degenerative endplate irregularity. Multilevel ventral osteophytes. Cord: Within the limitations of motion degradation, no definite spinal cord signal abnormality is identified. Posterior Fossa, vertebral arteries, paraspinal tissues: Flow voids preserved within the cervical vertebral arteries. The presence of life-support tubes precludes adequate evaluation of the prevertebral soft tissues. Disc levels: Moderate  C5-C6 disc degeneration. Mild to moderate disc degeneration at the remaining levels. C2-C3: Right greater than left disc osteophyte ridge/uncinate hypertrophy. No significant spinal canal stenosis. Moderate right neural foraminal narrowing. C3-C4: Posterior disc osteophyte complex. Uncinate, facet and ligamentum flavum hypertrophy. Mild/moderate spinal canal stenosis. Hypertrophied ligamentum flavum contacts the dorsal aspect of the cord. Moderate bilateral neural foraminal narrowing. C4-C5:  Posterior disc osteophyte complex. Superimposed small central disc protrusion. Uncinate, facet and ligamentum flavum hypertrophy. Mild/moderate spinal canal stenosis. The disc protrusion may contact the ventral spinal cord. Additionally, hypertrophied ligamentum flavum contacts the dorsal aspect of the cord. Moderate bilateral neural foraminal narrowing. C5-C6: Posterior disc osteophyte complex. Uncinate/facet hypertrophy. Mild relative spinal canal narrowing. Bilateral neural foraminal narrowing (moderate right, severe left). C6-C7: Posterior disc osteophyte complex. Uncinate/facet hypertrophy. Mild relative spinal canal narrowing. Moderate bilateral neural foraminal narrowing. C7-T1: No significant disc herniation or spinal canal stenosis. Facet hypertrophy. Bilateral neural foraminal narrowing (moderate right, moderate/severe left). IMPRESSION: MR head: 1. Punctate acute/early subacute infarcts within the right thalamus, right thalamocapsular junction, right external capsule and right cerebellum. 2. Mild chronic small vessel ischemic disease. Chronic lacunar infarcts within the left caudate and left cerebellum. 3. 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 4. Additional microhemorrhages within the cerebral hemispheres in a more peripheral distribution, which may reflect sequela of cerebral amyloid angiopathy. 5. Small volume chronic  subarachnoid blood products along the right parietal lobe. 6. Moderate generalized parenchymal atrophy. MRI cervical spine: 1. Motion degraded examination. 2. No definite spinal cord signal abnormality is identified. 3. Cervical spondylosis as outlined. Multifactorial spinal canal stenosis is greatest at C3-C4 and C4-C5. Hypertrophied ligamentum flavum contacts the dorsal aspect of the cord at C3-C4 and C4-C5. Additionally, a central disc protrusion may contact the ventral cord at C4-C5. 4. Multilevel neural foraminal narrowing as detailed. Electronically Signed   By: Kellie Simmering DO   On: 02/03/2019 17:44   MR CERVICAL SPINE WO CONTRAST  Result Date: 02/03/2019 CLINICAL DATA:  Encephalopathy. Encephalopathy and myelopathy, acute or progressive. EXAM: MRI HEAD WITHOUT CONTRAST MRI CERVICAL SPINE WITHOUT CONTRAST TECHNIQUE: Multiplanar, multiecho pulse sequences of the brain and surrounding structures, and cervical spine, to include the craniocervical junction and cervicothoracic junction, were obtained without intravenous contrast. COMPARISON:  Head CT 02/02/2019, cervical spine CT 02/01/2019 FINDINGS: MRI HEAD FINDING: Brain: Intermittent motion degradation. There are punctate foci of restricted diffusion (3 total) within the right basal ganglia, right thalamus, right thalamocapsular junction and right external capsule (series 5, image 78). These foci are too small to characterize on the ADC map but are suspicious for acute/subacute infarcts. Additional punctate acute/subacute infarct within the right cerebellum (series 5, image 68). There is no evidence of intracranial mass. No midline shift or extra-axial fluid collection. There are numerous microhemorrhages within the cerebellum, thalami and cerebral hemispheres. The central and posterior fossa microhemorrhages. Small volume chronic subarachnoid blood products along the right parietal lobe. Chronic lacunar infarcts within the left caudate and left  cerebellum. Background mild chronic small vessel ischemic disease. Moderate generalized parenchymal atrophy. Vascular: Flow voids maintained within the proximal large arterial vessels. Skull and upper cervical spine: No focal calvarial marrow lesion. Cervical spine reported below. Sinuses/Orbits: Visualized orbits demonstrate no acute abnormality. Mild scattered paranasal sinus mucosal thickening. Trace fluid within left mastoid air cells. MRI CERVICAL SPINE FINDINGS Multiple sequences are mild to moderately motion degraded, limiting evaluation. Alignment: Straightening of the expected cervical lordosis. No significant spondylolisthesis. Vertebrae: Vertebral body height is maintained. No marrow edema or suspicious osseous lesion. Multilevel degenerative endplate irregularity. Multilevel ventral osteophytes. Cord: Within the limitations of motion degradation, no definite spinal cord signal abnormality is identified. Posterior Fossa, vertebral arteries, paraspinal tissues: Flow voids preserved within the cervical vertebral arteries. The presence of life-support tubes precludes adequate evaluation of the prevertebral soft tissues. Disc levels: Moderate C5-C6 disc degeneration.  Mild to moderate disc degeneration at the remaining levels. C2-C3: Right greater than left disc osteophyte ridge/uncinate hypertrophy. No significant spinal canal stenosis. Moderate right neural foraminal narrowing. C3-C4: Posterior disc osteophyte complex. Uncinate, facet and ligamentum flavum hypertrophy. Mild/moderate spinal canal stenosis. Hypertrophied ligamentum flavum contacts the dorsal aspect of the cord. Moderate bilateral neural foraminal narrowing. C4-C5: Posterior disc osteophyte complex. Superimposed small central disc protrusion. Uncinate, facet and ligamentum flavum hypertrophy. Mild/moderate spinal canal stenosis. The disc protrusion may contact the ventral spinal cord. Additionally, hypertrophied ligamentum flavum contacts the  dorsal aspect of the cord. Moderate bilateral neural foraminal narrowing. C5-C6: Posterior disc osteophyte complex. Uncinate/facet hypertrophy. Mild relative spinal canal narrowing. Bilateral neural foraminal narrowing (moderate right, severe left). C6-C7: Posterior disc osteophyte complex. Uncinate/facet hypertrophy. Mild relative spinal canal narrowing. Moderate bilateral neural foraminal narrowing. C7-T1: No significant disc herniation or spinal canal stenosis. Facet hypertrophy. Bilateral neural foraminal narrowing (moderate right, moderate/severe left). IMPRESSION: MR head: 1. Punctate acute/early subacute infarcts within the right thalamus, right thalamocapsular junction, right external capsule and right cerebellum. 2. Mild chronic small vessel ischemic disease. Chronic lacunar infarcts within the left caudate and left cerebellum. 3. 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 4. Additional microhemorrhages within the cerebral hemispheres in a more peripheral distribution, which may reflect sequela of cerebral amyloid angiopathy. 5. Small volume chronic subarachnoid blood products along the right parietal lobe. 6. Moderate generalized parenchymal atrophy. MRI cervical spine: 1. Motion degraded examination. 2. No definite spinal cord signal abnormality is identified. 3. Cervical spondylosis as outlined. Multifactorial spinal canal stenosis is greatest at C3-C4 and C4-C5. Hypertrophied ligamentum flavum contacts the dorsal aspect of the cord at C3-C4 and C4-C5. Additionally, a central disc protrusion may contact the ventral cord at C4-C5. 4. Multilevel neural foraminal narrowing as detailed. Electronically Signed   By: Kellie Simmering DO   On: 02/03/2019 17:44   DG Pelvis Portable  Result Date: 02/01/2019 CLINICAL DATA:  Fall. EXAM: PORTABLE PELVIS 1-2 VIEWS COMPARISON:  01/26/2019. FINDINGS: Diffuse osteopenia. Degenerative  changes lumbar spine and both hips. No acute bony abnormality identified. No evidence of fracture. IMPRESSION: Diffuse osteopenia. Degenerative changes lumbar spine and both hips. No acute abnormality identified. Electronically Signed   By: Marcello Moores  Register   On: 02/01/2019 15:04   DG CHEST PORT 1 VIEW  Result Date: 02/09/2019 CLINICAL DATA:  86 year old female with history of fever. EXAM: PORTABLE CHEST 1 VIEW COMPARISON:  Chest x-ray 02/02/2019. FINDINGS: Diffuse ill-defined opacities throughout the mid to lower lungs bilaterally. Small to moderate bilateral pleural effusions lying dependently. Heart size is mildly enlarged. Pulmonary vasculature is partially obscured, but does not appear engorged. Upper mediastinal contours are within normal limits. IMPRESSION: 1. Worsening airspace disease throughout the mid to lower lungs bilaterally with small bilateral pleural effusions. Findings are concerning for multilobar bilateral pneumonia. 2. Mild cardiomegaly. Electronically Signed   By: Vinnie Langton M.D.   On: 02/09/2019 09:29   DG CHEST PORT 1 VIEW  Result Date: 02/02/2019 CLINICAL DATA:  Post intubation. EXAM: PORTABLE CHEST 1 VIEW COMPARISON:  Radiograph yesterday. FINDINGS: Endotracheal tube tip 2 cm from the carina. Tip and side port of the enteric tube below the diaphragm in the stomach. Right costophrenic angle is not included in the field of view, and there is overlying artifact in this region. Unchanged heart size and mediastinal contours allowing for rotation. Slight increase in streaky bibasilar opacities, favoring atelectasis. No evidence of pneumothorax or large pleural effusion.  Bones diffusely under mineralized. IMPRESSION: 1. Endotracheal tube tip 2 cm from the carina. Tip and side port of the enteric tube below the diaphragm in the stomach. 2. Increasing streaky bibasilar opacities, favoring atelectasis. 3. Right lateral aspect of the thorax not included in the field of view.  Electronically Signed   By: Keith Rake M.D.   On: 02/02/2019 01:00   DG Chest Port 1 View  Result Date: 02/01/2019 CLINICAL DATA:  Fall. EXAM: PORTABLE CHEST 1 VIEW COMPARISON:  01/26/2019. FINDINGS: Cardiac silhouette is normal in size. No mediastinal or hilar masses. Lungs are clear.  No pleural effusion or pneumothorax. Stable changes from right breast surgery. Skeletal structures are demineralized but grossly intact. IMPRESSION: 1. No acute cardiopulmonary disease. Electronically Signed   By: Lajean Manes M.D.   On: 02/01/2019 15:05   DG Shoulder Left  Result Date: 01/26/2019 CLINICAL DATA:  Fall with pain to the bilateral shoulder. Initial encounter. EXAM: LEFT SHOULDER - 2+ VIEW COMPARISON:  None. FINDINGS: No acute fracture or dislocation. Generalized osteopenia and degenerative spurring at the glenohumeral joint. IMPRESSION: No acute finding. Electronically Signed   By: Monte Fantasia M.D.   On: 01/26/2019 04:48   DG Knee Complete 4 Views Left  Result Date: 01/31/2019 CLINICAL DATA:  Fall. Leg pain. EXAM: LEFT KNEE - COMPLETE 4+ VIEW COMPARISON:  01/26/2019 FINDINGS: No acute fracture or dislocation is identified. Osteoarthrosis is again noted including prominent lateral compartment osteophytosis. There is likely a small persistent knee joint effusion. Mild soft tissue swelling is noted anterior to the knee. The bones are diffusely osteopenic. IMPRESSION: 1. No acute osseous abnormality identified. 2. Small knee joint effusion. 3. Advanced lateral compartment osteoarthrosis. Electronically Signed   By: Logan Bores M.D.   On: 01/31/2019 07:29   DG Knee Complete 4 Views Left  Result Date: 01/26/2019 CLINICAL DATA:  Fall with knee pain on the left. EXAM: LEFT KNEE - COMPLETE 4+ VIEW COMPARISON:  08/22/2018 FINDINGS: No acute fracture or dislocation. Prominent degenerative spurring at the patellofemoral and lateral compartments. There is a small knee joint effusion. Osteopenia.  IMPRESSION: 1. Negative for fracture. 2. Advanced lateral and patellofemoral compartment osteoarthritis. 3. Small knee joint effusion. Electronically Signed   By: Monte Fantasia M.D.   On: 01/26/2019 04:51   DG Knee Complete 4 Views Right  Result Date: 01/26/2019 CLINICAL DATA:  Fall with right leg pain. EXAM: RIGHT KNEE - COMPLETE 4+ VIEW COMPARISON:  None. FINDINGS: No evidence of fracture, dislocation, or joint effusion. Anterior soft tissue swelling. Advanced osteoarthritic spurring with joint narrowing at the lateral and patellofemoral compartments. Generalized osteopenia. IMPRESSION: 1. Soft tissue swelling without fracture. 2. Advanced osteoarthritis at the lateral and patellofemoral compartments. Electronically Signed   By: Monte Fantasia M.D.   On: 01/26/2019 04:49   DG Knee Left Port  Result Date: 02/04/2019 CLINICAL DATA:  Left lower leg swelling. Intubated patient unable to communicate pain. EXAM: PORTABLE LEFT KNEE - 1-2 VIEW COMPARISON:  None. FINDINGS: No evidence of fracture, dislocation, or joint effusion. Tricompartmental osteoarthritis with peripheral spurring. Moderate lateral tibiofemoral and patellofemoral joint space narrowing. Small quadriceps and patellar tendon enthesophytes. Mild generalized soft tissue edema. No soft tissue air. Bones are under mineralized. IMPRESSION: Tricompartmental osteoarthritis without acute osseous abnormality. Nonspecific soft tissue edema. Electronically Signed   By: Keith Rake M.D.   On: 02/04/2019 00:18   DG Tibia/Fibula Left Port  Result Date: 02/04/2019 CLINICAL DATA:  Left lower leg swelling. Intubated patient unable to communicate pain.  EXAM: PORTABLE LEFT TIBIA AND FIBULA - 2 VIEW COMPARISON:  None. FINDINGS: Cortical margins of the tibia and fibula are intact. No fracture or focal lesion. No periosteal reaction or bony destruction. Mild generalized soft tissue edema. IMPRESSION: Soft tissue edema. No acute osseous abnormality.  Electronically Signed   By: Keith Rake M.D.   On: 02/04/2019 00:19   DG Abd Portable 1V  Result Date: 02/02/2019 CLINICAL DATA:  Nasogastric placement. EXAM: PORTABLE ABDOMEN - 1 VIEW COMPARISON:  None. FINDINGS: Nasogastric tube enters the stomach, extends to the midbody in than doubles back upon itself. Gas pattern is unremarkable. IMPRESSION: Nasogastric tube enters the stomach, extends to the mid body and than doubles back upon itself. Electronically Signed   By: Nelson Chimes M.D.   On: 02/02/2019 12:27   EEG adult  Result Date: 02/02/2019 Lora Havens, MD     02/02/2019 12:21 PM Patient Name: Jocelyn Mccann MRN: [id number] Epilepsy Attending: Lora Havens Referring Physician/Provider: Dr. Kathrynn Speed Date: 02/02/2019 Duration: 24.05 minutes Patient history: 86 year old female who initially presented to the emergency room after a fall and was noted to be in sepsis on arrival.  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  EEG to evaluate for seizures. Level of alertness: Comatose AEDs during EEG study: Keppra, propofol Technical aspects: This EEG study was done with scalp electrodes positioned according to the 10-20 International system of electrode placement. Electrical activity was acquired at a sampling rate of 500Hz  and reviewed with a high frequency filter of 70Hz  and a low frequency filter of 1Hz . EEG data were recorded continuously and digitally stored. Description: EEG showed continuous generalized 3 to 6 Hz low voltage theta-delta slowing.  During the study, noxious stimuli was applied to patient's left shoulder and left hand after which patient was noted to have left hand tremor like movement lasting for few seconds.  This was reproducible on the left hand after stimulation and again when stimulation was applied  to the right hand.  Concomitant EEG showed 2 to 5 Hz rhythmic theta-delta slowing without any evolution.  Hyperventilation and photic stimulation were not performed. Abnormalities -Continuous slow, generalized IMPRESSION: This study is suggestive of severe diffuse encephalopathy, nonspecific to etiology but could be secondary to sedation.  Episodes of left and right hand tremor-like movement on stimulation was recorded during the study without concomitant EEG change to suggest seizure.  No seizures or epileptiform discharges were seen throughout the recording.   ECHOCARDIOGRAM COMPLETE  Result Date: 02/02/2019   ECHOCARDIOGRAM REPORT   Patient Name:   Jocelyn Mccann Date of Exam: 02/02/2019 Medical Rec #:  [id number]       Height:       62.0 in Accession #:    [id number]      Weight:       140.7 lb Date of Birth:  1928-09-18       BSA:          1.65 m Patient Age:    78 years        BP:           118/69 mmHg Patient Gender: F               HR:           68 bpm. Exam Location:  Inpatient Procedure: 2D Echo, Cardiac Doppler and Color Doppler  Indications:    Shock  History:        Patient has no prior history of Echocardiogram examinations.                 Signs/Symptoms:Syncope; Risk Factors:Non-Smoker. Septic shock.  Sonographer:    Paulita Fujita RDCS Referring Phys: [id number] [place]  Sonographer Comments: Echo performed with patient supine and on artificial respirator. IMPRESSIONS  1. Left ventricular ejection fraction, by visual estimation, is 60 to 65%. The left ventricle has normal function. There is no left ventricular hypertrophy.  2. Left ventricular diastolic parameters are consistent with Grade I diastolic dysfunction (impaired relaxation).  3. The left ventricle has no regional wall motion abnormalities.  4. Global right ventricle has normal systolic function.The right ventricular size is normal. No increase in right ventricular wall thickness.  5. Left atrial size was mildly dilated.  6. Right  atrial size was normal.  7. The mitral valve is normal in structure. Mild mitral valve regurgitation. No evidence of mitral stenosis.  8. The tricuspid valve is normal in structure.  9. The tricuspid valve is normal in structure. Tricuspid valve regurgitation is moderate-severe. 10. The aortic valve is normal in structure. Aortic valve regurgitation is not visualized. No evidence of aortic valve sclerosis or stenosis. 11. The pulmonic valve was normal in structure. Pulmonic valve regurgitation is not visualized. 12. Mildly elevated pulmonary artery systolic pressure. 13. The tricuspid regurgitant velocity is 2.45 m/s, and with an assumed right atrial pressure of 15 mmHg, the estimated right ventricular systolic pressure is mildly elevated at 38.9 mmHg. 14. The inferior vena cava is dilated in size with <50% respiratory variability, suggesting right atrial pressure of 15 mmHg. FINDINGS  Left Ventricle: Left ventricular ejection fraction, by visual estimation, is 60 to 65%. The left ventricle has normal function. The left ventricle has no regional wall motion abnormalities. There is no left ventricular hypertrophy. Left ventricular diastolic parameters are consistent with Grade I diastolic dysfunction (impaired relaxation). Normal left atrial pressure. Right Ventricle: The right ventricular size is normal. No increase in right ventricular wall thickness. Global RV systolic function is has normal systolic function. The tricuspid regurgitant velocity is 2.45 m/s, and with an assumed right atrial pressure  of 15 mmHg, the estimated right ventricular systolic pressure is mildly elevated at 38.9 mmHg. Left Atrium: Left atrial size was mildly dilated. Right Atrium: Right atrial size was normal in size Pericardium: There is no evidence of pericardial effusion. Mitral Valve: The mitral valve is normal in structure. Mild mitral valve regurgitation. No evidence of mitral valve stenosis by observation. Tricuspid Valve: The  tricuspid valve is normal in structure. Tricuspid valve regurgitation is moderate-severe. Aortic Valve: The aortic valve is normal in structure. Aortic valve regurgitation is not visualized. The aortic valve is structurally normal, with no evidence of sclerosis or stenosis. Pulmonic Valve: The pulmonic valve was normal in structure. Pulmonic valve regurgitation is not visualized. Pulmonic regurgitation is not visualized. Aorta: The aortic root, ascending aorta and aortic arch are all structurally normal, with no evidence of dilitation or obstruction. Venous: The inferior vena cava is dilated in size with less than 50% respiratory variability, suggesting right atrial pressure of 15 mmHg. IAS/Shunts: No atrial level shunt detected by color flow Doppler. There is no evidence of a patent foramen ovale. No ventricular septal defect is seen or detected. There is no evidence of an atrial septal defect.  LEFT VENTRICLE PLAX 2D LVIDd:  4.00 cm       Diastology LVIDs:         3.00 cm       LV e' lateral:   9.25 cm/s LV PW:         0.90 cm       LV E/e' lateral: 7.3 LV IVS:        0.90 cm       LV e' medial:    7.29 cm/s LVOT diam:     1.70 cm       LV E/e' medial:  9.2 LV SV:         35 ml LV SV Index:   20.77 LVOT Area:     2.27 cm  LV Volumes (MOD) LV area d, A2C:    30.50 cm LV area d, A4C:    30.80 cm LV area s, A2C:    15.80 cm LV area s, A4C:    16.10 cm LV major d, A2C:   7.73 cm LV major d, A4C:   8.12 cm LV major s, A2C:   6.17 cm LV major s, A4C:   6.89 cm LV vol d, MOD A2C: 101.0 ml LV vol d, MOD A4C: 96.7 ml LV vol s, MOD A2C: 34.0 ml LV vol s, MOD A4C: 32.3 ml LV SV MOD A2C:     67.0 ml LV SV MOD A4C:     96.7 ml LV SV MOD BP:      66.8 ml RIGHT VENTRICLE RV S prime:     13.20 cm/s TAPSE (M-mode): 2.6 cm LEFT ATRIUM             Index       RIGHT ATRIUM           Index LA diam:        3.20 cm 1.94 cm/m  RA Area:     13.10 cm LA Vol (A2C):   56.1 ml 34.08 ml/m RA Volume:   29.60 ml  17.98 ml/m LA  Vol (A4C):   64.0 ml 38.88 ml/m LA Biplane Vol: 61.8 ml 37.54 ml/m  AORTIC VALVE LVOT Vmax:   111.00 cm/s LVOT Vmean:  73.300 cm/s LVOT VTI:    0.249 m  AORTA Ao Root diam: 2.80 cm MITRAL VALVE                        TRICUSPID VALVE MV Area (PHT): 2.87 cm             TR Peak grad:   23.9 mmHg MV PHT:        76.56 msec           TR Vmax:        264.00 cm/s MV Decel Time: 264 msec MR Peak grad: 93.7 mmHg             SHUNTS MR Vmax:      484.00 cm/s           Systemic VTI:  0.25 m MV E velocity: 67.30 cm/s 103 cm/s  Systemic Diam: 1.70 cm MV A velocity: 68.10 cm/s 70.3 cm/s MV E/A ratio:  0.99       1.5  Candee Furbish MD Electronically signed by Candee Furbish MD Signature Date/Time: 02/02/2019/1:50:04 PM    Final    DG Hip Unilat W or Wo Pelvis 2-3 Views Left  Result Date: 01/31/2019 CLINICAL DATA:  Fall. Leg pain. EXAM: DG HIP (WITH OR WITHOUT PELVIS) 2-3V  LEFT COMPARISON:  Pelvis radiograph 01/26/2019 FINDINGS: No acute fracture or hip dislocation is identified. A 1.5 cm oval calcification projects medial to the right femoral head, in a different position than on the prior study and extending beyond the hip joint, possibly within the overlying soft tissues. Lower lumbar spondylosis is noted. IMPRESSION: No evidence of acute osseous abnormality. Electronically Signed   By: Logan Bores M.D.   On: 01/31/2019 07:27   CT MAXILLOFACIAL WO CONTRAST  Result Date: 01/26/2019 CLINICAL DATA:  Fall several days ago EXAM: CT MAXILLOFACIAL WITHOUT CONTRAST; CT CERVICAL SPINE WITHOUT CONTRAST; CT HEAD WITHOUT CONTRAST TECHNIQUE: Multidetector CT imaging of the maxillofacial structures was performed. Multiplanar CT image reconstructions were also generated. COMPARISON:  November 29, 2018 FINDINGS: Brain: No evidence of acute territorial infarction, hemorrhage, hydrocephalus,extra-axial collection or mass lesion/mass effect. There is dilatation the ventricles and sulci consistent with age-related atrophy. Low-attenuation  changes in the deep white matter consistent with small vessel ischemia. Vascular: No hyperdense vessel or unexpected calcification. Skull: The skull is intact. No fracture or focal lesion identified. Sinuses/Orbits: The visualized paranasal sinuses and mastoid air cells are clear. The orbits and globes intact. Other: None Face: Osseous: No acute fracture or other significant osseous abnormality.The nasal bone, mandibles, zygomatic arches and pterygoid plates are intact. Orbits: No fracture identified. Unremarkable appearance of globes and orbits. Sinuses: The visualized paranasal sinuses and mastoid air cells are unremarkable. Soft tissues:  Right periorbital soft tissue swelling is seen. Limited intracranial: No acute findings. Cervical spine: Alignment: Straightening of the normal cervical lordosis.12 Skull base and vertebrae: Visualized skull base is intact. No atlanto-occipital dissociation. The vertebral body heights are well maintained. No fracture or pathologic osseous lesion seen. Soft tissues and spinal canal: The visualized paraspinal soft tissues are unremarkable. No prevertebral soft tissue swelling is seen. The spinal canal is grossly unremarkable, no large epidural collection or significant canal narrowing. Disc levels: Multilevel cervical spine spondylosis is seen with disc height loss, uncovertebral osteophytes and disc osteophyte complex most notable from C4 through C6. Upper chest: The lung apices are clear. Thoracic inlet is within normal limits. Other: None IMPRESSION: No acute intracranial abnormality. Findings consistent with age related atrophy and chronic small vessel ischemia No acute facial fracture Right periorbital soft tissue swelling. No acute fracture or malalignment of the spine. Electronically Signed   By: Prudencio Pair M.D.   On: 01/26/2019 05:44   US Abdomen Limited RUQ  Result Date: 02/02/2019 CLINICAL DATA:  Elevated LFTs. EXAM: ULTRASOUND ABDOMEN LIMITED RIGHT UPPER QUADRANT  COMPARISON:  None. FINDINGS: Gallbladder: Physiologically distended. No gallstones or wall thickening visualized. No sonographic Murphy sign noted by sonographer. Common bile duct: Diameter: Difficult to delineate, tentatively visualized measuring 9 mm. Liver: Simple cyst in the right lobe measures 1.7 x 1.2 x 1.7 cm. No evidence of solid lesion. Borderline increased in parenchymal echogenicity. Portal vein is patent on color Doppler imaging with normal direction of blood flow towards the liver. Other: No right upper quadrant ascites/free fluid. IMPRESSION: 1. Normal sonographic appearance of the gallbladder. 2. Question of mild biliary dilatation for age, common bile duct measures 9 mm. Please note the common bile duct is not well-defined sonographically. 3. Simple cyst in the right lobe of the liver. Borderline steatosis. Electronically Signed   By: Keith Rake M.D.   On: 02/02/2019 04:51     The results of significant diagnostics from this hospitalization (including imaging, microbiology, ancillary and laboratory) are listed below for reference.  Microbiology: Recent Results (from the past 240 hour(s))  Body fluid culture (indicate joint specimen source)     Status: None   Collection Time: 02/08/19  7:33 PM   Specimen: Synovium; Body Fluid  Result Value Ref Range Status   Specimen Description SYNOVIAL RIGHT KNEE  Final   Special Requests NONE  Final   Gram Stain   Final    ABUNDANT WBC PRESENT, PREDOMINANTLY PMN NO ORGANISMS SEEN    Culture   Final    NO GROWTH 3 DAYS Performed at [facility], [address] [place], [place] [address]    Report Status 02/12/2019 FINAL  Final  Culture, blood (routine x 2)     Status: None (Preliminary result)   Collection Time: 02/09/19 12:04 AM   Specimen: BLOOD  Result Value Ref Range Status   Specimen Description BLOOD LEFT ARM  Final   Special Requests   Final    BOTTLES DRAWN AEROBIC ONLY Blood Culture adequate volume Performed at  [facility], [place] [address], [place], [place] [address]    Culture NO GROWTH 4 DAYS  Final   Report Status PENDING  Incomplete  Culture, blood (routine x 2)     Status: None (Preliminary result)   Collection Time: 02/09/19  1:14 AM   Specimen: BLOOD  Result Value Ref Range Status   Specimen Description BLOOD RIGHT HAND  Final   Special Requests   Final    BOTTLES DRAWN AEROBIC AND ANAEROBIC Blood Culture results may not be optimal due to an inadequate volume of blood received in culture bottles Performed at [facility], [place] [address], [place], [place] [address]    Culture NO GROWTH 4 DAYS  Final   Report Status PENDING  Incomplete  Culture, Urine     Status: None   Collection Time: 02/09/19 10:59 PM   Specimen: Urine, Random  Result Value Ref Range Status   Specimen Description URINE, RANDOM  Final   Special Requests NONE  Final   Culture   Final    NO GROWTH Performed at [facility], [place] [address], [place], [place] [address]    Report Status 02/11/2019 FINAL  Final  SARS CORONAVIRUS 2 (TAT 6-24 HRS) Nasopharyngeal Nasopharyngeal Swab     Status: None   Collection Time: 02/11/19 11:07 AM   Specimen: Nasopharyngeal Swab  Result Value Ref Range Status   SARS Coronavirus 2 NEGATIVE NEGATIVE Final    Comment: (NOTE) SARS-CoV-2 target nucleic acids are NOT DETECTED. The SARS-CoV-2 RNA is generally detectable in upper and lower respiratory specimens during the acute phase of infection. Negative results do not preclude SARS-CoV-2 infection, do not rule out co-infections with other pathogens, and should not be used as the sole basis for treatment or other patient management decisions. Negative results must be combined with clinical observations, patient history, and epidemiological information. The expected result is Negative. Fact Sheet for Patients: SugarRoll.be Fact Sheet for Healthcare  Providers: https://www.woods-mathews.com/ This test is not yet approved or cleared by the [place] FDA and  has been authorized for detection and/or diagnosis of SARS-CoV-2 by FDA under an Emergency Use Authorization (EUA). This EUA will remain  in effect (meaning this test can be used) for the duration of the COVID-19 declaration under Section 56 4(b)(1) of the Act, 21 U.S.C. section 360bbb-3(b)(1), unless the authorization is terminated or revoked sooner. Performed at [facility], [place] [address], [place], [place] [address]  Labs: BNP (last 3 results) No results for input(s): BNP in the last 8760 hours. Basic Metabolic Panel: Recent Labs  Lab 02/09/19 0113 02/10/19 0402 02/11/19 0318 02/12/19 0435 02/13/19 0256  NA 136 137 139 139 138  K 4.2 4.8 4.6 4.2 4.4  CL 107 107 109 106 106  CO2 21* 22 21* 24 24  GLUCOSE 113* 118* 106* 107* 110*  BUN 21 28* 26* 15 15  CREATININE 0.62 0.90 0.80 0.68 0.71  CALCIUM 8.9 9.1 9.1 9.4 9.0   Liver Function Tests: No results for input(s): AST, ALT, ALKPHOS, BILITOT, PROT, ALBUMIN in the last 168 hours. No results for input(s): LIPASE, AMYLASE in the last 168 hours. No results for input(s): AMMONIA in the last 168 hours. CBC: Recent Labs  Lab 02/07/19 0227 02/07/19 0227 02/08/19 0814 02/09/19 0004 02/09/19 0113 02/10/19 0402 02/11/19 0318 02/12/19 0435 02/13/19 0256  WBC 3.7*   < > 4.2   < > 2.7* 4.2 4.5 4.3 5.1  NEUTROABS 2.3  --  2.7  --   --   --   --   --   --   HGB 8.4*   < > 8.6*   < > 8.1* 8.7* 9.6* 8.9* 8.3*  HCT 25.4*   < > 26.1*   < > 24.3* 25.5* 28.8* 26.7* 25.4*  MCV 100.4*   < > 100.8*   < > 98.0 97.7 99.0 98.9 99.6  PLT 81*   < > 72*   < > 78* 152 243 233 242   < > = values in this interval not displayed.   Cardiac Enzymes: No results for input(s): CKTOTAL, CKMB, CKMBINDEX, TROPONINI in the last 168 hours. BNP: Invalid input(s): POCBNP CBG: Recent Labs  Lab 02/12/19 0012  02/12/19 0435 02/12/19 0741 02/12/19 1119 02/12/19 1554  GLUCAP 100* 105* 98 107* 151*   D-Dimer No results for input(s): DDIMER in the last 72 hours. Hgb A1c No results for input(s): HGBA1C in the last 72 hours. Lipid Profile No results for input(s): CHOL, HDL, LDLCALC, TRIG, CHOLHDL, LDLDIRECT in the last 72 hours. Thyroid function studies No results for input(s): TSH, T4TOTAL, T3FREE, THYROIDAB in the last 72 hours.  Invalid input(s): FREET3 Anemia work up No results for input(s): VITAMINB12, FOLATE, FERRITIN, TIBC, IRON, RETICCTPCT in the last 72 hours. Urinalysis    Component Value Date/Time   COLORURINE YELLOW 02/09/2019 2300   APPEARANCEUR HAZY (A) 02/09/2019 2300   LABSPEC 1.021 02/09/2019 2300   PHURINE 5.0 02/09/2019 2300   GLUCOSEU NEGATIVE 02/09/2019 2300   HGBUR NEGATIVE 02/09/2019 2300   BILIRUBINUR NEGATIVE 02/09/2019 2300   KETONESUR NEGATIVE 02/09/2019 2300   PROTEINUR 30 (A) 02/09/2019 2300   NITRITE NEGATIVE 02/09/2019 2300   LEUKOCYTESUR NEGATIVE 02/09/2019 2300   Sepsis Labs Invalid input(s): PROCALCITONIN,  WBC,  LACTICIDVEN Microbiology Recent Results (from the past 240 hour(s))  Body fluid culture (indicate joint specimen source)     Status: None   Collection Time: 02/08/19  7:33 PM   Specimen: Synovium; Body Fluid  Result Value Ref Range Status   Specimen Description SYNOVIAL RIGHT KNEE  Final   Special Requests NONE  Final   Gram Stain   Final    ABUNDANT WBC PRESENT, PREDOMINANTLY PMN NO ORGANISMS SEEN    Culture   Final    NO GROWTH 3 DAYS Performed at [facility], [address] [place], [place] [address]    Report Status 02/12/2019 FINAL  Final  Culture, blood (routine x  2)     Status: None (Preliminary result)   Collection Time: 02/09/19 12:04 AM   Specimen: BLOOD  Result Value Ref Range Status   Specimen Description BLOOD LEFT ARM  Final   Special Requests   Final    BOTTLES DRAWN AEROBIC ONLY Blood Culture adequate  volume Performed at [facility] [address] [place], [place] [address]    Culture NO GROWTH 4 DAYS  Final   Report Status PENDING  Incomplete  Culture, blood (routine x 2)     Status: None (Preliminary result)   Collection Time: 02/09/19  1:14 AM   Specimen: BLOOD  Result Value Ref Range Status   Specimen Description BLOOD RIGHT HAND  Final   Special Requests   Final    BOTTLES DRAWN AEROBIC AND ANAEROBIC Blood Culture results may not be optimal due to an inadequate volume of blood received in culture bottles Performed at [facility], [place] [address], [place], [place] [address]    Culture NO GROWTH 4 DAYS  Final   Report Status PENDING  Incomplete  Culture, Urine     Status: None   Collection Time: 02/09/19 10:59 PM   Specimen: Urine, Random  Result Value Ref Range Status   Specimen Description URINE, RANDOM  Final   Special Requests NONE  Final   Culture   Final    NO GROWTH Performed at [facility], [place] [address], [place], [place] [address]    Report Status 02/11/2019 FINAL  Final  SARS CORONAVIRUS 2 (TAT 6-24 HRS) Nasopharyngeal Nasopharyngeal Swab     Status: None   Collection Time: 02/11/19 11:07 AM   Specimen: Nasopharyngeal Swab  Result Value Ref Range Status   SARS Coronavirus 2 NEGATIVE NEGATIVE Final    Comment: (NOTE) SARS-CoV-2 target nucleic acids are NOT DETECTED. The SARS-CoV-2 RNA is generally detectable in upper and lower respiratory specimens during the acute phase of infection. Negative results do not preclude SARS-CoV-2 infection, do not rule out co-infections with other pathogens, and should not be used as the sole basis for treatment or other patient management decisions. Negative results must be combined with clinical observations, patient history, and epidemiological information. The expected result is Negative. Fact Sheet for Patients: SugarRoll.be Fact Sheet for Healthcare  Providers: https://www.woods-mathews.com/ This test is not yet approved or cleared by the [place] FDA and  has been authorized for detection and/or diagnosis of SARS-CoV-2 by FDA under an Emergency Use Authorization (EUA). This EUA will remain  in effect (meaning this test can be used) for the duration of the COVID-19 declaration under Section 56 4(b)(1) of the Act, 21 U.S.C. section 360bbb-3(b)(1), unless the authorization is terminated or revoked sooner. Performed at [facility], [place] [address] [place], [place] [address]      Time coordinating discharge in minutes: 65  SIGNED:   Debbe Odea, MD  [facility] 02/13/2019, 12:11 PM Pager   If 7PM-7AM, please contact night-coverage [email or web address] Password [email or web address]

## 2019-02-13 NOTE — Progress Notes (Signed)
Report called to [employer].  Patient had upper dentures and hat only belongings to be sent with patient to [facility].  Awaiting ambulance for transport.

## 2019-02-13 NOTE — TOC Transition Note (Signed)
Transition of Care [facility]) - CM/SW Discharge Note   Patient Details  Name: Jocelyn Mccann MRN: [id number] Date of Birth: [birth year]  [facility] of Care [facility]) CM/SW Contact:  Vinie Sill, [place] Phone Number: 02/13/2019, 12:26 PM   Clinical Narrative:     Patient will DC to: [facility] Date: 02/13/2019 Family Notified: [name],daughter Transport By: Corey Harold  RN, patient, and facility notified of DC. Discharge Summary sent to facility. RN given number for report [phone]. Ambulance transport requested for patient.   Clinical Social Worker signing off. Thurmond Butts, MSW, LCSWA Clinical Social Worker    Final next level of care: Skilled Nursing Facility Barriers to Discharge: Insurance Authorization   Patient Goals and CMS Choice        Discharge Placement              Patient chooses bed at: [facility] Patient to be transferred to facility by: [facility] Name of family member notified: Pencilla,daughter Patient and family notified of of transfer: 02/13/19  Discharge Plan and Services In-house Referral: Clinical Social Work                                   Social Determinants of Health (SDOH) Interventions     Readmission Risk Interventions No flowsheet data found.

## 2019-02-13 NOTE — Consult Note (Signed)
   [facility] CM Inpatient Consult   02/13/2019  ETHELEE GAGEL Dec 12, 1928 [id number]   Followup: update Active [facility]  Chart reviewed briefly and appreciate Palliative Care consult and follow up.    Plan:  Update [facility] Care Management team of disposition and needs.  Patient with [facility].  Currently, continue to recommended for a skilled nursing facility level of care.  4:22 pm:  Patient to [place], [place] Management Coordinator already aware.  Sign off.  Natividad Brood, RN BSN [facility] Liaison  [phone] business mobile phone Toll free office [phone]  Fax number: [phone] [email or web address]

## 2019-02-14 ENCOUNTER — Encounter: Payer: Self-pay | Admitting: Internal Medicine

## 2019-02-14 ENCOUNTER — Non-Acute Institutional Stay (SKILLED_NURSING_FACILITY): Payer: Medicare HMO | Admitting: Internal Medicine

## 2019-02-14 ENCOUNTER — Other Ambulatory Visit: Payer: Self-pay

## 2019-02-14 DIAGNOSIS — R6520 Severe sepsis without septic shock: Secondary | ICD-10-CM

## 2019-02-14 DIAGNOSIS — R569 Unspecified convulsions: Secondary | ICD-10-CM | POA: Diagnosis not present

## 2019-02-14 DIAGNOSIS — F0280 Dementia in other diseases classified elsewhere without behavioral disturbance: Secondary | ICD-10-CM

## 2019-02-14 DIAGNOSIS — E162 Hypoglycemia, unspecified: Secondary | ICD-10-CM

## 2019-02-14 DIAGNOSIS — A419 Sepsis, unspecified organism: Secondary | ICD-10-CM | POA: Diagnosis not present

## 2019-02-14 DIAGNOSIS — G9340 Encephalopathy, unspecified: Secondary | ICD-10-CM | POA: Diagnosis not present

## 2019-02-14 DIAGNOSIS — G309 Alzheimer's disease, unspecified: Secondary | ICD-10-CM

## 2019-02-14 LAB — CULTURE, BLOOD (ROUTINE X 2)
Culture: NO GROWTH
Culture: NO GROWTH
Special Requests: ADEQUATE

## 2019-02-14 NOTE — Patient Outreach (Signed)
[facility]) Care Management  02/14/2019  SARAIH GRIESEMER 16-Mar-1928 [id number]   Follow up call to daughter today to inform her that [facility] Work case will be closed due to patient's transition to SNF.  Patient will be followed by [facility] Acute Care Coordinator.    Ronn Melena, BSW Social Worker [phone]

## 2019-02-14 NOTE — Progress Notes (Signed)
Location:    [facility] Room Number: [id number] Place of Service:  SNF [phone]) Provider:  Leda Min, MD  Patient Care Team: [name], MD as PCP - General (Internal Medicine) Magrinat, Virgie Dad, MD as Consulting Physician (Oncology) Magrinat, Virgie Dad, MD as Consulting Physician (Oncology) Susa Day, MD as Consulting Physician (Orthopedic Surgery) Stark Klein, MD as Consulting Physician (General Surgery) Gery Pray, MD as Consulting Physician (Radiation Oncology) [place], Amber as [facility]  Extended Emergency Contact Information Primary Emergency Contact: Ibrahim,[name] Address: [address]          [place], [place] [address] Johnnette Litter of [place] Phone: [phone] Mobile Phone: [phone] Relation: Daughter Secondary Emergency Contact: justus, strebe Mobile Phone: [phone] Relation: Son  Code Status:  Full Code Goals of care: Advanced Directive information Advanced Directives 02/14/2019  Does Patient Have a Medical Advance Directive? Yes  Type of Advance Directive (No Data)  Does patient want to make changes to medical advance directive? No - Patient declined  Copy of [place] in Chart? -  Would patient like information on creating a medical advance directive? -     Chief Complaint  Patient presents with  . Hospitalization Follow-up    Hospitalization Follow Up Visit  Status post hospitalization for encephalopathy thought secondary to sepsis with UTI.  Also history of CVA  HPI:  Pt is a 86 y.o. female seen today for a hospital f/u s/p admission for encephalopathy with sepsis thought secondary to UTI-also question of a CVA.  Patient came to the ER after a fall at home-apparently she had worsening confusion and increased weakness.  In the ER patient apparently had what was thought to be a seizure and required intubation.  She  also had an MRI of the brain which showed possible punctate acute-early subacute infarcts within the right thalamus right thalamo capsular junction-right external capsule and right cerebellum.  She also was treated for septic shock due to UTI.  Subsequently she was thought to have developed healthcare associated pneumonia.  She was started on vancomycin and cefepime 9.  CT of the head showed a new basal ganglia infarct but MRI did not show that finding.  Regards to seizure and her Keppra was increased to 500 mg twice daily.  Regards to the possible CVA noted on MRI on January 23 neurology recommended supportive care and due to thrombocytopenia at that time aspirin was not started but has been restarted upon discharge  Hypoglycemia was thought secondary to sepsis and resolved.  Currently she is resting in bed comfortably she remains confused but pleasantly so nursing does not really report any issues.    Her antibiotics were continued and she eventually regained her baseline mental status.    Past Medical History:  Diagnosis Date  . Bilateral knee pain   . Breast cancer ([place])    diagnosed 11/2017  . Dementia ([place])   . Osteoporosis   . Seizures ([place]) 02/06/2019  . Urge incontinence of urine    Past Surgical History:  Procedure Laterality Date  . BREAST LUMPECTOMY Right 2001  . BREAST LUMPECTOMY Right 12/2017  . BREAST LUMPECTOMY WITH SENTINEL LYMPH NODE BIOPSY Right 2001  . BREAST LUMPECTOMY WITH SENTINEL LYMPH NODE BIOPSY Right 12/30/2017   Procedure: RIGHT BREAST LUMPECTOMY WITH SENTINEL LYMPH NODE BIOPSY ERAS PATHWAY;  Surgeon: Stark Klein, MD;  Location: [facility];  Service: General;  Laterality: Right;  . DENTAL  SURGERY      Allergies  Allergen Reactions  . Milk-Related Compounds Diarrhea  . Pork-Derived Products Other (See Comments)    PATIENT DOES NOT EAT PORK    Allergies as of 02/14/2019      Reactions   Milk-related Compounds Diarrhea   Pork-derived Products  Other (See Comments)   PATIENT DOES NOT EAT PORK      Medication List       Accurate as of February 14, 2019 10:31 AM. If you have any questions, ask your nurse or doctor.        STOP taking these medications   aspirin EC 81 MG tablet Stopped by: Granville Lewis, PA-C   NyQuil Severe Cold/Flu 5-6.25-10-325 MG/15ML Liqd Generic drug: Phenyleph-Doxylamine-DM-APAP Stopped by: Granville Lewis, PA-C     TAKE these medications   acetaminophen 325 MG tablet Commonly known as: TYLENOL Take 2 tablets (650 mg total) by mouth every 6 (six) hours as needed for mild pain (or Fever >/= 101).   anastrozole 1 MG tablet Commonly known as: ARIMIDEX Take 1 tablet (1 mg total) by mouth daily.   bisacodyl 10 MG suppository Commonly known as: DULCOLAX If not relieved by MOM, give 10 mg Bisacodyl suppositiory rectally X 1 dose in 24 hours as needed (Do not use constipation standing orders for residents with renal failure/CFR less than 30. Contact MD for orders) (Physician Order)   cholecalciferol 25 MCG (1000 UNIT) tablet Commonly known as: VITAMIN D3 Take 1 tablet (1,000 Units total) by mouth daily.   donepezil 5 MG tablet Commonly known as: ARICEPT TAKE 1 TABLET (5 MG TOTAL) BY MOUTH AT BEDTIME. FOR ALZHEIMER'S DISEASE   feeding supplement (ENSURE ENLIVE) Liqd Take 237 mLs by mouth 3 (three) times daily between meals.   levETIRAcetam 500 MG tablet Commonly known as: KEPPRA Take 1 tablet (500 mg total) by mouth 2 (two) times daily.   magnesium hydroxide 400 MG/5ML suspension Commonly known as: MILK OF MAGNESIA If no BM in 3 days, give 30 cc Milk of Magnesium p.o. x 1 dose in 24 hours as needed (Do not use standing constipation orders for residents with renal failure CFR less than 30. Contact MD for orders) (Physician Order)   MELATONIN PO Take 1 tablet by mouth at bedtime as needed (for sleep).   multivitamin with minerals Tabs tablet Take 1 tablet by mouth daily.   NON FORMULARY DIET:  Reg NAS, Heart Healthy, No Milk Compound, Pork derived products   QUEtiapine 25 MG tablet Commonly known as: SEROQUEL TAKE 1 TABLET BY MOUTH EVERYDAY AT BEDTIME   RA SALINE ENEMA RE If not relieved by Biscodyl suppository, give disposable Saline Enema rectally X 1 dose/24 hrs as needed (Do not use constipation standing orders for residents with renal failure/CFR less than 30. Contact MD for orders)(Physician Or   sertraline 25 MG tablet Commonly known as: ZOLOFT Take 1 tablet (25 mg total) by mouth daily.   traMADol 50 MG tablet Commonly known as: ULTRAM Take 1 tablet (50 mg total) by mouth every 6 (six) hours as needed for moderate pain.   Voltaren 1 % Gel Generic drug: diclofenac Sodium Apply 2-4 g topically See admin instructions. Apply 2-4 grams to both knees once daily for pain       Review of Systems   This is quite limited secondary to dementia please see HPI-when asked patient does not have any complaints   There is no immunization history on file for this patient. Pertinent  Health Maintenance  Due  Topic Date Due  . PNA vac Low Risk Adult (1 of 2 - PCV13) 02/07/1995  . INFLUENZA VACCINE  Completed  . DEXA SCAN  Completed   Fall Risk  09/21/2018 03/31/2017  Falls in the past year? 1 Yes  Number falls in past yr: 1 1  Injury with Fall? 0 No  Risk for fall due to : History of fall(s);Medication side effect -  Follow up Falls evaluation completed;Falls prevention discussed;Education provided -   Functional Status Survey:    Vitals:   02/14/19 0954  BP: (!) 168/86  Pulse: 89  Resp: 19  Temp: 97.9 F (36.6 C)  TempSrc: Oral  SpO2: 99%  Weight: 119 lb (54 kg)  Height: 5\' 2"  (1.575 m)   Body mass index is 21.77 kg/m. Physical Exam In general this is a pleasantly confused elderly female in no distress resting comfortably in bed.  Her skin is warm and dry.-She does have a protective boot on right lower extremity secondary to history of right heel pressure  ulcer   Eyes visual acuity appears to be intact.  Oropharynx is clear.  Chest is clear to auscultation initially there was some very slight congestion on the left but with cough this cleared.  Heart is regular rate and rhythm without murmur gallop or rub she has trace lower extremity edema protective boots are in place.  Abdomen is soft nontender with positive bowel sounds.  Musculoskeletal Limited exam since she is in bed but appears able to move all extremities x4.  Neurologic appears grossly intact cannot really appreciate lateralizing findings her speech is clear but confused.  Psych she is oriented to self only she does follow simple verbal commands.   Labs reviewed:  February 14, 2019.  WBC 6.3 hemoglobin 9.2 platelets 278.  Sodium 151 potassium 5.0 BUN 14.3 creatinine 0.5.   Recent Labs    02/01/19 2346 02/02/19 0302 02/03/19 0312 02/04/19 0230 02/05/19 0217 02/06/19 0559 02/11/19 0318 02/12/19 0435 02/13/19 0256  NA   < >  --  142   < > 137   < > 139 139 138  K   < >  --  4.3   < > 4.0   < > 4.6 4.2 4.4  CL   < >  --  112*   < > 108   < > 109 106 106  CO2   < >  --  24   < > 22   < > 21* 24 24  GLUCOSE   < >  --  119*   < > 110*   < > 106* 107* 110*  BUN   < >  --  29*   < > 33*   < > 26* 15 15  CREATININE   < >  --  1.05*   < > 0.83   < > 0.80 0.68 0.71  CALCIUM   < >  --  8.6*   < > 8.6*   < > 9.1 9.4 9.0  MG  --  1.6* 2.1  --  1.9  --   --   --   --   PHOS  --  2.6  --   --   --   --   --   --   --    < > = values in this interval not displayed.   Recent Labs    02/04/19 0230 02/05/19 0217 02/06/19 0559  AST 111* 87* 191*  ALT 129*  111* 184*  ALKPHOS 106 96 121  BILITOT 0.9 0.4 0.8  PROT 4.9* 4.8* 5.7*  ALBUMIN 2.1* 2.0* 2.3*   Recent Labs    02/05/19 1255 02/05/19 1255 02/07/19 0227 02/07/19 0227 02/08/19 0814 02/09/19 0004 02/11/19 0318 02/12/19 0435 02/13/19 0256  WBC 3.9*   < > 3.7*   < > 4.2   < > 4.5 4.3 5.1  NEUTROABS 2.5   --  2.3  --  2.7  --   --   --   --   HGB 9.0*   < > 8.4*   < > 8.6*   < > 9.6* 8.9* 8.3*  HCT 27.9*   < > 25.4*   < > 26.1*   < > 28.8* 26.7* 25.4*  MCV 102.2*   < > 100.4*   < > 100.8*   < > 99.0 98.9 99.6  PLT 59*   < > 81*   < > 72*   < > 243 233 242   < > = values in this interval not displayed.   Lab Results  Component Value Date   TSH 2.742 02/09/2019   Lab Results  Component Value Date   HGBA1C 5.6 02/02/2019   Lab Results  Component Value Date   TRIG 24 02/05/2019    Significant Diagnostic Results in last 30 days:  CT HEAD WO CONTRAST  Result Date: 02/02/2019 CLINICAL DATA:  Encephalopathy. EXAM: CT HEAD WITHOUT CONTRAST TECHNIQUE: Contiguous axial images were obtained from the base of the skull through the vertex without intravenous contrast. COMPARISON:  Head CT yesterday, also 01/31/2019 FINDINGS: Brain: No intracranial hemorrhage, mass effect, or midline shift. Stable atrophy and chronic small vessel ischemia. No hydrocephalus. The basilar cisterns are patent. No evidence of territorial infarct or acute ischemia. No extra-axial or intracranial fluid collection. Vascular: Atherosclerosis of skullbase vasculature without hyperdense vessel or abnormal calcification. Skull: No fracture or focal lesion. Sinuses/Orbits: New endotracheal and nasogastric tube. Other: Right frontal scalp hematoma IMPRESSION: Right frontal scalp hematoma. No acute intracranial abnormality. Stable atrophy and chronic small vessel ischemia. Electronically Signed   By: Keith Rake M.D.   On: 02/02/2019 02:35   CT Head Wo Contrast  Result Date: 02/01/2019 CLINICAL DATA:  Head and neck injury. EXAM: CT HEAD WITHOUT CONTRAST CT CERVICAL SPINE WITHOUT CONTRAST TECHNIQUE: Multidetector CT imaging of the head and cervical spine was performed following the standard protocol without intravenous contrast. Multiplanar CT image reconstructions of the cervical spine were also generated. COMPARISON:  Same  examinations yesterday. FINDINGS: CT HEAD FINDINGS Brain: Generalized atrophy, with some temporal lobe predominance. No sign of acute infarction of the brainstem or cerebellum. Chronic small-vessel ischemic changes of the cerebral hemispheric white matter. Old small vessel basal ganglia infarctions. No mass lesion, hemorrhage, hydrocephalus or extra-axial collection. Vascular: There is atherosclerotic calcification of the major vessels at the base of the brain. Skull: Negative.  No skull fracture. Sinuses/Orbits: Clear/normal Other: None CT CERVICAL SPINE FINDINGS Alignment: No traumatic malalignment. Skull base and vertebrae: No fracture in the cervical or upper thoracic region. Soft tissues and spinal canal: Negative Disc levels: Chronic degenerative spondylosis from C2-3 through C6-7. Facet osteoarthritis on the right at C2-3, C3-4 and C4-5 and on the left at C3-4, C4-5 and C7-T1. Upper chest: Negative Other: None IMPRESSION: No change since yesterday. Head CT: No acute or traumatic finding. Advanced atrophy, temporal lobe predominant. Chronic small-vessel changes. Cervical spine CT: No acute or traumatic finding. Chronic degenerative cervical spondylosis and facet osteoarthritis. Electronically  Signed   By: Nelson Chimes M.D.   On: 02/01/2019 16:33   CT Cervical Spine Wo Contrast  Result Date: 02/01/2019 CLINICAL DATA:  Head and neck injury. EXAM: CT HEAD WITHOUT CONTRAST CT CERVICAL SPINE WITHOUT CONTRAST TECHNIQUE: Multidetector CT imaging of the head and cervical spine was performed following the standard protocol without intravenous contrast. Multiplanar CT image reconstructions of the cervical spine were also generated. COMPARISON:  Same examinations yesterday. FINDINGS: CT HEAD FINDINGS Brain: Generalized atrophy, with some temporal lobe predominance. No sign of acute infarction of the brainstem or cerebellum. Chronic small-vessel ischemic changes of the cerebral hemispheric white matter. Old small  vessel basal ganglia infarctions. No mass lesion, hemorrhage, hydrocephalus or extra-axial collection. Vascular: There is atherosclerotic calcification of the major vessels at the base of the brain. Skull: Negative.  No skull fracture. Sinuses/Orbits: Clear/normal Other: None CT CERVICAL SPINE FINDINGS Alignment: No traumatic malalignment. Skull base and vertebrae: No fracture in the cervical or upper thoracic region. Soft tissues and spinal canal: Negative Disc levels: Chronic degenerative spondylosis from C2-3 through C6-7. Facet osteoarthritis on the right at C2-3, C3-4 and C4-5 and on the left at C3-4, C4-5 and C7-T1. Upper chest: Negative Other: None IMPRESSION: No change since yesterday. Head CT: No acute or traumatic finding. Advanced atrophy, temporal lobe predominant. Chronic small-vessel changes. Cervical spine CT: No acute or traumatic finding. Chronic degenerative cervical spondylosis and facet osteoarthritis. Electronically Signed   By: Nelson Chimes M.D.   On: 02/01/2019 16:33   DG Pelvis Portable  Result Date: 02/01/2019 CLINICAL DATA:  Fall. EXAM: PORTABLE PELVIS 1-2 VIEWS COMPARISON:  01/26/2019. FINDINGS: Diffuse osteopenia. Degenerative changes lumbar spine and both hips. No acute bony abnormality identified. No evidence of fracture. IMPRESSION: Diffuse osteopenia. Degenerative changes lumbar spine and both hips. No acute abnormality identified. Electronically Signed   By: Marcello Moores  Register   On: 02/01/2019 15:04   DG CHEST PORT 1 VIEW  Result Date: 02/02/2019 CLINICAL DATA:  Post intubation. EXAM: PORTABLE CHEST 1 VIEW COMPARISON:  Radiograph yesterday. FINDINGS: Endotracheal tube tip 2 cm from the carina. Tip and side port of the enteric tube below the diaphragm in the stomach. Right costophrenic angle is not included in the field of view, and there is overlying artifact in this region. Unchanged heart size and mediastinal contours allowing for rotation. Slight increase in streaky  bibasilar opacities, favoring atelectasis. No evidence of pneumothorax or large pleural effusion. Bones diffusely under mineralized. IMPRESSION: 1. Endotracheal tube tip 2 cm from the carina. Tip and side port of the enteric tube below the diaphragm in the stomach. 2. Increasing streaky bibasilar opacities, favoring atelectasis. 3. Right lateral aspect of the thorax not included in the field of view. Electronically Signed   By: Keith Rake M.D.   On: 02/02/2019 01:00   DG Chest Port 1 View  Result Date: 02/01/2019 CLINICAL DATA:  Fall. EXAM: PORTABLE CHEST 1 VIEW COMPARISON:  01/26/2019. FINDINGS: Cardiac silhouette is normal in size. No mediastinal or hilar masses. Lungs are clear.  No pleural effusion or pneumothorax. Stable changes from right breast surgery. Skeletal structures are demineralized but grossly intact. IMPRESSION: 1. No acute cardiopulmonary disease. Electronically Signed   By: Lajean Manes M.D.   On: 02/01/2019 15:05   DG Abd Portable 1V  Result Date: 02/02/2019 CLINICAL DATA:  Nasogastric placement. EXAM: PORTABLE ABDOMEN - 1 VIEW COMPARISON:  None. FINDINGS: Nasogastric tube enters the stomach, extends to the midbody in than doubles back upon itself. Gas pattern is  unremarkable. IMPRESSION: Nasogastric tube enters the stomach, extends to the mid body and than doubles back upon itself. Electronically Signed   By: Nelson Chimes M.D.   On: 02/02/2019 12:27   EEG adult  Result Date: 02/02/2019 Lora Havens, MD     02/02/2019 12:21 PM Patient Name: MALAYJAH HAMNER MRN: [id number] Epilepsy Attending: Lora Havens Referring Physician/Provider: Dr. Kathrynn Speed Date: 02/02/2019 Duration: 24.05 minutes Patient history: 86 year old female who initially presented to the emergency room after a fall and was noted to be in sepsis on arrival.  While in the hospital she developed hypoglycemia and hypotension with worsening mental status as well as eye twitching, right facial droop and  right hemiparesis concerning for nonconvulsive status epilepticus.  EEG to evaluate for seizures. Level of alertness: Comatose AEDs during EEG study: Keppra, propofol Technical aspects: This EEG study was done with scalp electrodes positioned according to the 10-20 International system of electrode placement. Electrical activity was acquired at a sampling rate of 500Hz  and reviewed with a high frequency filter of 70Hz  and a low frequency filter of 1Hz . EEG data were recorded continuously and digitally stored. Description: EEG showed continuous generalized 3 to 6 Hz low voltage theta-delta slowing.  During the study, noxious stimuli was applied to patient's left shoulder and left hand after which patient was noted to have left hand tremor like movement lasting for few seconds.  This was reproducible on the left hand after stimulation and again when stimulation was applied to the right hand.  Concomitant EEG showed 2 to 5 Hz rhythmic theta-delta slowing without any evolution.  Hyperventilation and photic stimulation were not performed. Abnormalities -Continuous slow, generalized IMPRESSION: This study is suggestive of severe diffuse encephalopathy, nonspecific to etiology but could be secondary to sedation.  Episodes of left and right hand tremor-like movement on stimulation was recorded during the study without concomitant EEG change to suggest seizure.  No seizures or epileptiform discharges were seen throughout the recording.   ECHOCARDIOGRAM COMPLETE  Result Date: 02/02/2019   ECHOCARDIOGRAM REPORT   Patient Name:   MADRA PARRON Date of Exam: 02/02/2019 Medical Rec #:  [id number]       Height:       62.0 in Accession #:    [id number]      Weight:       140.7 lb Date of Birth:  08/13/1928       BSA:          1.65 m Patient Age:    15 years        BP:           118/69 mmHg Patient Gender: F               HR:           68 bpm. Exam Location:  Inpatient Procedure: 2D Echo, Cardiac Doppler and Color Doppler  Indications:    Shock  History:        Patient has no prior history of Echocardiogram examinations.                 Signs/Symptoms:Syncope; Risk Factors:Non-Smoker. Septic shock.  Sonographer:    Paulita Fujita RDCS Referring Phys: [id number] [place]  Sonographer Comments: Echo performed with patient supine and on artificial respirator. IMPRESSIONS  1. Left ventricular ejection fraction, by visual estimation, is 60 to 65%. The left ventricle has normal function. There is no left ventricular hypertrophy.  2. Left ventricular diastolic parameters are consistent  with Grade I diastolic dysfunction (impaired relaxation).  3. The left ventricle has no regional wall motion abnormalities.  4. Global right ventricle has normal systolic function.The right ventricular size is normal. No increase in right ventricular wall thickness.  5. Left atrial size was mildly dilated.  6. Right atrial size was normal.  7. The mitral valve is normal in structure. Mild mitral valve regurgitation. No evidence of mitral stenosis.  8. The tricuspid valve is normal in structure.  9. The tricuspid valve is normal in structure. Tricuspid valve regurgitation is moderate-severe. 10. The aortic valve is normal in structure. Aortic valve regurgitation is not visualized. No evidence of aortic valve sclerosis or stenosis. 11. The pulmonic valve was normal in structure. Pulmonic valve regurgitation is not visualized. 12. Mildly elevated pulmonary artery systolic pressure. 13. The tricuspid regurgitant velocity is 2.45 m/s, and with an assumed right atrial pressure of 15 mmHg, the estimated right ventricular systolic pressure is mildly elevated at 38.9 mmHg. 14. The inferior vena cava is dilated in size with <50% respiratory variability, suggesting right atrial pressure of 15 mmHg. FINDINGS  Left Ventricle: Left ventricular ejection fraction, by visual estimation, is 60 to 65%. The left ventricle has normal function. The left ventricle has no  regional wall motion abnormalities. There is no left ventricular hypertrophy. Left ventricular diastolic parameters are consistent with Grade I diastolic dysfunction (impaired relaxation). Normal left atrial pressure. Right Ventricle: The right ventricular size is normal. No increase in right ventricular wall thickness. Global RV systolic function is has normal systolic function. The tricuspid regurgitant velocity is 2.45 m/s, and with an assumed right atrial pressure  of 15 mmHg, the estimated right ventricular systolic pressure is mildly elevated at 38.9 mmHg. Left Atrium: Left atrial size was mildly dilated. Right Atrium: Right atrial size was normal in size Pericardium: There is no evidence of pericardial effusion. Mitral Valve: The mitral valve is normal in structure. Mild mitral valve regurgitation. No evidence of mitral valve stenosis by observation. Tricuspid Valve: The tricuspid valve is normal in structure. Tricuspid valve regurgitation is moderate-severe. Aortic Valve: The aortic valve is normal in structure. Aortic valve regurgitation is not visualized. The aortic valve is structurally normal, with no evidence of sclerosis or stenosis. Pulmonic Valve: The pulmonic valve was normal in structure. Pulmonic valve regurgitation is not visualized. Pulmonic regurgitation is not visualized. Aorta: The aortic root, ascending aorta and aortic arch are all structurally normal, with no evidence of dilitation or obstruction. Venous: The inferior vena cava is dilated in size with less than 50% respiratory variability, suggesting right atrial pressure of 15 mmHg. IAS/Shunts: No atrial level shunt detected by color flow Doppler. There is no evidence of a patent foramen ovale. No ventricular septal defect is seen or detected. There is no evidence of an atrial septal defect.  LEFT VENTRICLE PLAX 2D LVIDd:         4.00 cm       Diastology LVIDs:         3.00 cm       LV e' lateral:   9.25 cm/s LV PW:         0.90 cm        LV E/e' lateral: 7.3 LV IVS:        0.90 cm       LV e' medial:    7.29 cm/s LVOT diam:     1.70 cm       LV E/e' medial:  9.2 LV SV:  35 ml LV SV Index:   20.77 LVOT Area:     2.27 cm  LV Volumes (MOD) LV area d, A2C:    30.50 cm LV area d, A4C:    30.80 cm LV area s, A2C:    15.80 cm LV area s, A4C:    16.10 cm LV major d, A2C:   7.73 cm LV major d, A4C:   8.12 cm LV major s, A2C:   6.17 cm LV major s, A4C:   6.89 cm LV vol d, MOD A2C: 101.0 ml LV vol d, MOD A4C: 96.7 ml LV vol s, MOD A2C: 34.0 ml LV vol s, MOD A4C: 32.3 ml LV SV MOD A2C:     67.0 ml LV SV MOD A4C:     96.7 ml LV SV MOD BP:      66.8 ml RIGHT VENTRICLE RV S prime:     13.20 cm/s TAPSE (M-mode): 2.6 cm LEFT ATRIUM             Index       RIGHT ATRIUM           Index LA diam:        3.20 cm 1.94 cm/m  RA Area:     13.10 cm LA Vol (A2C):   56.1 ml 34.08 ml/m RA Volume:   29.60 ml  17.98 ml/m LA Vol (A4C):   64.0 ml 38.88 ml/m LA Biplane Vol: 61.8 ml 37.54 ml/m  AORTIC VALVE LVOT Vmax:   111.00 cm/s LVOT Vmean:  73.300 cm/s LVOT VTI:    0.249 m  AORTA Ao Root diam: 2.80 cm MITRAL VALVE                        TRICUSPID VALVE MV Area (PHT): 2.87 cm             TR Peak grad:   23.9 mmHg MV PHT:        76.56 msec           TR Vmax:        264.00 cm/s MV Decel Time: 264 msec MR Peak grad: 93.7 mmHg             SHUNTS MR Vmax:      484.00 cm/s           Systemic VTI:  0.25 m MV E velocity: 67.30 cm/s 103 cm/s  Systemic Diam: 1.70 cm MV A velocity: 68.10 cm/s 70.3 cm/s MV E/A ratio:  0.99       1.5  Candee Furbish MD Electronically signed by Candee Furbish MD Signature Date/Time: 02/02/2019/1:50:04 PM    Final    US Abdomen Limited RUQ  Result Date: 02/02/2019 CLINICAL DATA:  Elevated LFTs. EXAM: ULTRASOUND ABDOMEN LIMITED RIGHT UPPER QUADRANT COMPARISON:  None. FINDINGS: Gallbladder: Physiologically distended. No gallstones or wall thickening visualized. No sonographic Murphy sign noted by sonographer. Common bile duct: Diameter: Difficult  to delineate, tentatively visualized measuring 9 mm. Liver: Simple cyst in the right lobe measures 1.7 x 1.2 x 1.7 cm. No evidence of solid lesion. Borderline increased in parenchymal echogenicity. Portal vein is patent on color Doppler imaging with normal direction of blood flow towards the liver. Other: No right upper quadrant ascites/free fluid. IMPRESSION: 1. Normal sonographic appearance of the gallbladder. 2. Question of mild biliary dilatation for age, common bile duct measures 9 mm. Please note the common bile duct is not well-defined sonographically. 3. Simple cyst in the  right lobe of the liver. Borderline steatosis. Electronically Signed   By: Keith Rake M.D.   On: 02/02/2019 04:51    Assessment/Plan  #1 history of encephalopathy with dementia-thought secondary to sepsis with UTI-it appears she is back to her baseline mental status-she continues on Aricept 5 mg a day she also has orders for Seroquel 25 mg a day nightly.  Nursing does not report any behavioral issues.  At this point will monitor.  2.  History of sepsis with hypoglycemia hypotension and hypothermia-this was thought secondary to UTI which was subsequently followed by pneumonia-she did respond to antibiotics including Rocephin and then vancomycin and cefepime I am-at this point appears to be stable and resolved.  Her blood sugar this morning was 110 she is not listed as a diabetic.  3.  History of seizure disorder with a history of hypoglycemia and hypothermia-her Keppra was increased to 500 mg twice daily.  No further seizures to my knowledge.  4.  History of possible CVA noted on MRI-neurology did evaluate this and recommended supportive care initially aspirin was held because of thrombocytopenia but this has subsequently been restarted.  5.  History of right heel pressure ulcer she does have a protective boot on this is followed by wound care.  6.  History of left knee swelling she did receive a Depo-Medrol  injection by orthopedics-she does not complain of any pain this apparently was quite effective.  7.  History of anemia hemoglobin today was 9.2 which is up from 8.3 earlier in the week-.  8.  History of breast cancer she is on Arimidex.  9.  History of depression she continues on Zoloft 25 mg a day.  [id number] note greater than 35 minutes spent assessing patient-reviewing her chart and labs-discussing her status with nursing staff-and coordinating and formulating a plan of care for numerous diagnoses-of note greater than 50% of time spent coordinating a plan of care with input as noted above

## 2019-02-15 ENCOUNTER — Ambulatory Visit: Payer: Self-pay

## 2019-02-16 ENCOUNTER — Non-Acute Institutional Stay (SKILLED_NURSING_FACILITY): Payer: Medicare HMO | Admitting: Internal Medicine

## 2019-02-16 ENCOUNTER — Encounter: Payer: Self-pay | Admitting: Internal Medicine

## 2019-02-16 DIAGNOSIS — A419 Sepsis, unspecified organism: Secondary | ICD-10-CM

## 2019-02-16 DIAGNOSIS — N3090 Cystitis, unspecified without hematuria: Secondary | ICD-10-CM | POA: Diagnosis not present

## 2019-02-16 DIAGNOSIS — J189 Pneumonia, unspecified organism: Secondary | ICD-10-CM

## 2019-02-16 DIAGNOSIS — E162 Hypoglycemia, unspecified: Secondary | ICD-10-CM

## 2019-02-16 DIAGNOSIS — R6521 Severe sepsis with septic shock: Secondary | ICD-10-CM

## 2019-02-16 DIAGNOSIS — R569 Unspecified convulsions: Secondary | ICD-10-CM | POA: Diagnosis not present

## 2019-02-16 DIAGNOSIS — G9340 Encephalopathy, unspecified: Secondary | ICD-10-CM

## 2019-02-16 DIAGNOSIS — M1711 Unilateral primary osteoarthritis, right knee: Secondary | ICD-10-CM

## 2019-02-16 DIAGNOSIS — G309 Alzheimer's disease, unspecified: Secondary | ICD-10-CM | POA: Diagnosis not present

## 2019-02-16 DIAGNOSIS — I639 Cerebral infarction, unspecified: Secondary | ICD-10-CM | POA: Diagnosis not present

## 2019-02-16 DIAGNOSIS — F0280 Dementia in other diseases classified elsewhere without behavioral disturbance: Secondary | ICD-10-CM

## 2019-02-16 DIAGNOSIS — I6381 Other cerebral infarction due to occlusion or stenosis of small artery: Secondary | ICD-10-CM

## 2019-02-16 DIAGNOSIS — D61818 Other pancytopenia: Secondary | ICD-10-CM

## 2019-02-16 DIAGNOSIS — B9689 Other specified bacterial agents as the cause of diseases classified elsewhere: Secondary | ICD-10-CM

## 2019-02-16 NOTE — Progress Notes (Signed)
: Provider:  Noah Delaine. Miya Luviano MD Location:  [place] Room Number: [id number] Place of Service:  SNF ([phone])  PCP: [name], MD Patient Care Team: [name], MD as PCP - General (Internal Medicine) Magrinat, Virgie Dad, MD as Consulting Physician (Oncology) Magrinat, Virgie Dad, MD as Consulting Physician (Oncology) Susa Day, MD as Consulting Physician (Orthopedic Surgery) Stark Klein, MD as Consulting Physician (General Surgery) Gery Pray, MD as Consulting Physician (Radiation Oncology)  Extended Emergency Contact Information Primary Emergency Contact: Ibrahim,[name] Address: [address]          [place], [place] [address] [place] Phone: [phone] Mobile Phone: [phone] Relation: Daughter Secondary Emergency Contact: nazira, lamb Mobile Phone: [phone] Relation: Son     Allergies: Milk-related compounds and Pork-derived products  Chief Complaint  Patient presents with  . New Admit To SNF    New Admission Visit to [facility]     HPI: Patient is 86 y.o. female with history of breast cancer treated with anastrozole followed by Dr. Elta Guadeloupe benign, history of SDH, Alzheimer's dementia followed by neurology with recurrent syncope who presented to [facility] with the fall.  Patient had been seen in the ED on 1/24 by her left eye for parallel above left eyebrow.  Family had noted worsening in her baseline confusion and increased weakness.  In the ED temperature was 97 degrees, white count was 1.1.  Patient was admitted to [facility] from 1/21-2/2 for hypothermia and neutropenia.  On 1/22 patient was obtunded with a CBG of 23 and a systolic blood pressure 60 appeared to have a seizure, intubated for airway protection, neuro consulted.  MRI 1/23 punctate acute/early subacute infarct within the right thalamus right external capsule and right cerebellum.  Treated for septic shock due to  UTI, extubated and transferred back to [facility] on 1/27.  On the night of 1/28 temperature is 91 patient again Obtunded and suspected to have HCAP.  Chest x-ray showed worsening airspace disease throughout the mid to lower lungs bilaterally.  Patient was started on vancomycin and cefepime.  For 1 week.  Patient is admitted to skilled nursing facility for OT/PT.  While at skilled nursing facility patient will be followed for breast cancer treated with anastrozole, dementia treated with Aricept, and insomnia treated with Seroquel.  Past Medical History:  Diagnosis Date  . Bilateral knee pain   . Breast cancer ([place])    diagnosed 11/2017  . Dementia ([place])   . Osteoporosis   . Seizures ([place]) 02/06/2019  . Urge incontinence of urine     Past Surgical History:  Procedure Laterality Date  . BREAST LUMPECTOMY Right 2001  . BREAST LUMPECTOMY Right 12/2017  . BREAST LUMPECTOMY WITH SENTINEL LYMPH NODE BIOPSY Right 2001  . BREAST LUMPECTOMY WITH SENTINEL LYMPH NODE BIOPSY Right 12/30/2017   Procedure: RIGHT BREAST LUMPECTOMY WITH SENTINEL LYMPH NODE BIOPSY ERAS PATHWAY;  Surgeon: Stark Klein, MD;  Location: [place];  Service: General;  Laterality: Right;  . DENTAL SURGERY      Outpatient Encounter Medications as of 02/16/2019  Medication Sig  . acetaminophen (TYLENOL) 325 MG tablet Take 2 tablets (650 mg total) by mouth every 6 (six) hours as needed for mild pain (or Fever >/= 101).  Marland Kitchen anastrozole (ARIMIDEX) 1 MG tablet Take 1 tablet (1 mg total) by mouth daily.  . bisacodyl (DULCOLAX) 10 MG suppository If not relieved by MOM, give 10 mg Bisacodyl suppositiory rectally  X 1 dose in 24 hours as needed (Do not use constipation standing orders for residents with renal failure/CFR less than 30. Contact MD for orders) (Physician Order)  . cholecalciferol (VITAMIN D3) 25 MCG (1000 UT) tablet Take 1 tablet (1,000 Units total) by mouth daily.  . diclofenac Sodium (VOLTAREN) 1 % GEL Apply 2-4 g topically See admin  instructions. Apply 2-4 grams to both knees once daily for pain  . donepezil (ARICEPT) 5 MG tablet TAKE 1 TABLET (5 MG TOTAL) BY MOUTH AT BEDTIME. FOR ALZHEIMER'S DISEASE  . feeding supplement, ENSURE ENLIVE, (ENSURE ENLIVE) LIQD Take 237 mLs by mouth 3 (three) times daily between meals.  . levETIRAcetam (KEPPRA) 500 MG tablet Take 1 tablet (500 mg total) by mouth 2 (two) times daily.  . magnesium hydroxide (MILK OF MAGNESIA) 400 MG/5ML suspension If no BM in 3 days, give 30 cc Milk of Magnesium p.o. x 1 dose in 24 hours as needed (Do not use standing constipation orders for residents with renal failure CFR less than 30. Contact MD for orders) (Physician Order)  . MELATONIN PO Take 1 tablet by mouth at bedtime as needed (for sleep).  . Multiple Vitamin (MULTIVITAMIN WITH MINERALS) TABS tablet Take 1 tablet by mouth daily.  . QUEtiapine (SEROQUEL) 25 MG tablet TAKE 1 TABLET BY MOUTH EVERYDAY AT BEDTIME  . sertraline (ZOLOFT) 25 MG tablet Take 1 tablet (25 mg total) by mouth daily.  . Sodium Phosphates (RA SALINE ENEMA RE) If not relieved by Biscodyl suppository, give disposable Saline Enema rectally X 1 dose/24 hrs as needed (Do not use constipation standing orders for residents with renal failure/CFR less than 30. Contact MD for orders)(Physician Or  . traMADol (ULTRAM) 50 MG tablet Take 1 tablet (50 mg total) by mouth every 6 (six) hours as needed for moderate pain.  . [DISCONTINUED] NON FORMULARY DIET: Reg NAS, Heart Healthy, No Milk Compound, Pork derived products   No facility-administered encounter medications on file as of 02/16/2019.     No orders of the defined types were placed in this encounter.    There is no immunization history on file for this patient.  Social History   Tobacco Use  . Smoking status: Never Smoker  . Smokeless tobacco: Never Used  Substance Use Topics  . Alcohol use: Never    Family history is   Family History  Problem Relation Age of Onset  . Cancer  Mother        breast      Review of Systems   very limited secondary to dementia; nursing-no acute concerns     Vitals:   02/16/19 1144  BP: (!) 151/78  Pulse: 81  Resp: 19  Temp: 98.2 F (36.8 C)  SpO2: 99%    SpO2 Readings from Last 1 Encounters:  02/16/19 99%   Body mass index is 29.37 kg/m.     Physical Exam  GENERAL APPEARANCE: Alert, conversant,  No acute distress.  SKIN: Boot right heel HEAD: Normocephalic, atraumatic  EYES: Conjunctiva/lids clear. Pupils round, reactive. EOMs intact.  EARS: External exam WNL, canals clear. Hearing grossly normal.  NOSE: No deformity or discharge.  MOUTH/THROAT: Lips w/o lesions  RESPIRATORY: Breathing is even, unlabored. Lung sounds are clear   CARDIOVASCULAR: Heart RRR no murmurs, rubs or gallops. No peripheral edema.   GASTROINTESTINAL: Abdomen is soft, non-tender, not distended w/ normal bowel sounds. GENITOURINARY: Bladder non tender, not distended  MUSCULOSKELETAL: No abnormal joints or musculature NEUROLOGIC:  Cranial nerves 2-12 grossly intact.  Moves all extremities  PSYCHIATRIC: Mood and affect confusion with dementia, no behavioral issues  Patient Active Problem List   Diagnosis Date Noted  . HCAP (healthcare-associated pneumonia) 02/09/2019  . Sepsis ([place]) 02/09/2019  . Pressure injury of skin 02/07/2019  . Hypoglycemia without diagnosis of diabetes mellitus 02/06/2019  . Seizures ([place]) 02/06/2019  . Pancytopenia ([place]) 02/06/2019  . Palliative care by specialist   . Goals of care, counseling/discussion   . Acute encephalopathy   . Dysphagia   . SIRS (systemic inflammatory response syndrome) ([place]) 02/01/2019  . Syncope 12/05/2018  . SDH (subdural hematoma) ([place]) 08/21/2018  . Malignant neoplasm of upper-outer quadrant of right breast in female, estrogen receptor positive ([place]) 12/20/2017  . Osteoarthritis of knee 06/01/2017  . Bilateral knee pain 03/31/2017  . Urge incontinence of urine 03/31/2017  .  Generalized anxiety disorder 03/31/2017  . Osteoporosis without current pathological fracture 03/31/2017  . Alzheimer disease ([place]) 03/31/2017      Labs reviewed: Basic Metabolic Panel:    Component Value Date/Time   NA 138 02/13/2019 0256   K 4.4 02/13/2019 0256   CL 106 02/13/2019 0256   CO2 24 02/13/2019 0256   GLUCOSE 110 (H) 02/13/2019 0256   BUN 15 02/13/2019 0256   CREATININE 0.71 02/13/2019 0256   CREATININE 0.85 03/31/2017 1144   CALCIUM 9.0 02/13/2019 0256   PROT 5.7 (L) 02/06/2019 0559   ALBUMIN 2.3 (L) 02/06/2019 0559   AST 191 (H) 02/06/2019 0559   ALT 184 (H) 02/06/2019 0559   ALKPHOS 121 02/06/2019 0559   BILITOT 0.8 02/06/2019 0559   GFRNONAA >60 02/13/2019 0256   GFRNONAA 62 03/31/2017 1144   GFRAA >60 02/13/2019 0256   GFRAA 71 03/31/2017 1144    Recent Labs    02/01/19 2346 02/02/19 0302 02/03/19 0312 02/04/19 0230 02/05/19 0217 02/06/19 0559 02/11/19 0318 02/12/19 0435 02/13/19 0256  NA   < >  --  142   < > 137   < > 139 139 138  K   < >  --  4.3   < > 4.0   < > 4.6 4.2 4.4  CL   < >  --  112*   < > 108   < > 109 106 106  CO2   < >  --  24   < > 22   < > 21* 24 24  GLUCOSE   < >  --  119*   < > 110*   < > 106* 107* 110*  BUN   < >  --  29*   < > 33*   < > 26* 15 15  CREATININE   < >  --  1.05*   < > 0.83   < > 0.80 0.68 0.71  CALCIUM   < >  --  8.6*   < > 8.6*   < > 9.1 9.4 9.0  MG  --  1.6* 2.1  --  1.9  --   --   --   --   PHOS  --  2.6  --   --   --   --   --   --   --    < > = values in this interval not displayed.   Liver Function Tests: Recent Labs    02/04/19 0230 02/05/19 0217 02/06/19 0559  AST 111* 87* 191*  ALT 129* 111* 184*  ALKPHOS 106 96 121  BILITOT 0.9 0.4 0.8  PROT 4.9* 4.8* 5.7*  ALBUMIN 2.1* 2.0* 2.3*   No results for input(s): LIPASE, AMYLASE in the last 8760 hours. Recent Labs    02/01/19 2348  AMMONIA 28   CBC: Recent Labs    02/05/19 1255 02/05/19 1255 02/07/19 0227 02/07/19 0227 02/08/19 0814  02/09/19 0004 02/11/19 0318 02/12/19 0435 02/13/19 0256  WBC 3.9*   < > 3.7*   < > 4.2   < > 4.5 4.3 5.1  NEUTROABS 2.5  --  2.3  --  2.7  --   --   --   --   HGB 9.0*   < > 8.4*   < > 8.6*   < > 9.6* 8.9* 8.3*  HCT 27.9*   < > 25.4*   < > 26.1*   < > 28.8* 26.7* 25.4*  MCV 102.2*   < > 100.4*   < > 100.8*   < > 99.0 98.9 99.6  PLT 59*   < > 81*   < > 72*   < > 243 233 242   < > = values in this interval not displayed.   Lipid Recent Labs    02/03/19 0312 02/04/19 0230 02/05/19 0217  TRIG 23 15 24     Cardiac Enzymes: Recent Labs    02/01/19 1450 02/01/19 2346  CKTOTAL 132 111   BNP: No results for input(s): BNP in the last 8760 hours. No results found for: [facility] Lab Results  Component Value Date   HGBA1C 5.6 02/02/2019   Lab Results  Component Value Date   TSH 2.742 02/09/2019   Lab Results  Component Value Date   VITAMINB12 81 (L) 02/02/2019   No results found for: FOLATE Lab Results  Component Value Date   IRON 113 02/02/2019   TIBC 262 02/02/2019   FERRITIN 266 02/02/2019    Imaging and Procedures obtained prior to SNF admission: CT HEAD WO CONTRAST  Result Date: 02/02/2019 CLINICAL DATA:  Encephalopathy. EXAM: CT HEAD WITHOUT CONTRAST TECHNIQUE: Contiguous axial images were obtained from the base of the skull through the vertex without intravenous contrast. COMPARISON:  Head CT yesterday, also 01/31/2019 FINDINGS: Brain: No intracranial hemorrhage, mass effect, or midline shift. Stable atrophy and chronic small vessel ischemia. No hydrocephalus. The basilar cisterns are patent. No evidence of territorial infarct or acute ischemia. No extra-axial or intracranial fluid collection. Vascular: Atherosclerosis of skullbase vasculature without hyperdense vessel or abnormal calcification. Skull: No fracture or focal lesion. Sinuses/Orbits: New endotracheal and nasogastric tube. Other: Right frontal scalp hematoma IMPRESSION: Right frontal scalp hematoma. No  acute intracranial abnormality. Stable atrophy and chronic small vessel ischemia. Electronically Signed   By: Keith Rake M.D.   On: 02/02/2019 02:35   CT Head Wo Contrast  Result Date: 02/01/2019 CLINICAL DATA:  Head and neck injury. EXAM: CT HEAD WITHOUT CONTRAST CT CERVICAL SPINE WITHOUT CONTRAST TECHNIQUE: Multidetector CT imaging of the head and cervical spine was performed following the standard protocol without intravenous contrast. Multiplanar CT image reconstructions of the cervical spine were also generated. COMPARISON:  Same examinations yesterday. FINDINGS: CT HEAD FINDINGS Brain: Generalized atrophy, with some temporal lobe predominance. No sign of acute infarction of the brainstem or cerebellum. Chronic small-vessel ischemic changes of the cerebral hemispheric white matter. Old small vessel basal ganglia infarctions. No mass lesion, hemorrhage, hydrocephalus or extra-axial collection. Vascular: There is atherosclerotic calcification of the major vessels at the base of the brain. Skull: Negative.  No skull fracture. Sinuses/Orbits: Clear/normal Other: None CT CERVICAL SPINE FINDINGS Alignment: No traumatic malalignment.  Skull base and vertebrae: No fracture in the cervical or upper thoracic region. Soft tissues and spinal canal: Negative Disc levels: Chronic degenerative spondylosis from C2-3 through C6-7. Facet osteoarthritis on the right at C2-3, C3-4 and C4-5 and on the left at C3-4, C4-5 and C7-T1. Upper chest: Negative Other: None IMPRESSION: No change since yesterday. Head CT: No acute or traumatic finding. Advanced atrophy, temporal lobe predominant. Chronic small-vessel changes. Cervical spine CT: No acute or traumatic finding. Chronic degenerative cervical spondylosis and facet osteoarthritis. Electronically Signed   By: Nelson Chimes M.D.   On: 02/01/2019 16:33   CT Cervical Spine Wo Contrast  Result Date: 02/01/2019 CLINICAL DATA:  Head and neck injury. EXAM: CT HEAD WITHOUT  CONTRAST CT CERVICAL SPINE WITHOUT CONTRAST TECHNIQUE: Multidetector CT imaging of the head and cervical spine was performed following the standard protocol without intravenous contrast. Multiplanar CT image reconstructions of the cervical spine were also generated. COMPARISON:  Same examinations yesterday. FINDINGS: CT HEAD FINDINGS Brain: Generalized atrophy, with some temporal lobe predominance. No sign of acute infarction of the brainstem or cerebellum. Chronic small-vessel ischemic changes of the cerebral hemispheric white matter. Old small vessel basal ganglia infarctions. No mass lesion, hemorrhage, hydrocephalus or extra-axial collection. Vascular: There is atherosclerotic calcification of the major vessels at the base of the brain. Skull: Negative.  No skull fracture. Sinuses/Orbits: Clear/normal Other: None CT CERVICAL SPINE FINDINGS Alignment: No traumatic malalignment. Skull base and vertebrae: No fracture in the cervical or upper thoracic region. Soft tissues and spinal canal: Negative Disc levels: Chronic degenerative spondylosis from C2-3 through C6-7. Facet osteoarthritis on the right at C2-3, C3-4 and C4-5 and on the left at C3-4, C4-5 and C7-T1. Upper chest: Negative Other: None IMPRESSION: No change since yesterday. Head CT: No acute or traumatic finding. Advanced atrophy, temporal lobe predominant. Chronic small-vessel changes. Cervical spine CT: No acute or traumatic finding. Chronic degenerative cervical spondylosis and facet osteoarthritis. Electronically Signed   By: Nelson Chimes M.D.   On: 02/01/2019 16:33   DG Pelvis Portable  Result Date: 02/01/2019 CLINICAL DATA:  Fall. EXAM: PORTABLE PELVIS 1-2 VIEWS COMPARISON:  01/26/2019. FINDINGS: Diffuse osteopenia. Degenerative changes lumbar spine and both hips. No acute bony abnormality identified. No evidence of fracture. IMPRESSION: Diffuse osteopenia. Degenerative changes lumbar spine and both hips. No acute abnormality identified.  Electronically Signed   By: Marcello Moores  Register   On: 02/01/2019 15:04   DG CHEST PORT 1 VIEW  Result Date: 02/02/2019 CLINICAL DATA:  Post intubation. EXAM: PORTABLE CHEST 1 VIEW COMPARISON:  Radiograph yesterday. FINDINGS: Endotracheal tube tip 2 cm from the carina. Tip and side port of the enteric tube below the diaphragm in the stomach. Right costophrenic angle is not included in the field of view, and there is overlying artifact in this region. Unchanged heart size and mediastinal contours allowing for rotation. Slight increase in streaky bibasilar opacities, favoring atelectasis. No evidence of pneumothorax or large pleural effusion. Bones diffusely under mineralized. IMPRESSION: 1. Endotracheal tube tip 2 cm from the carina. Tip and side port of the enteric tube below the diaphragm in the stomach. 2. Increasing streaky bibasilar opacities, favoring atelectasis. 3. Right lateral aspect of the thorax not included in the field of view. Electronically Signed   By: Keith Rake M.D.   On: 02/02/2019 01:00   DG Chest Port 1 View  Result Date: 02/01/2019 CLINICAL DATA:  Fall. EXAM: PORTABLE CHEST 1 VIEW COMPARISON:  01/26/2019. FINDINGS: Cardiac silhouette is normal in size. No mediastinal  or hilar masses. Lungs are clear.  No pleural effusion or pneumothorax. Stable changes from right breast surgery. Skeletal structures are demineralized but grossly intact. IMPRESSION: 1. No acute cardiopulmonary disease. Electronically Signed   By: Lajean Manes M.D.   On: 02/01/2019 15:05   DG Abd Portable 1V  Result Date: 02/02/2019 CLINICAL DATA:  Nasogastric placement. EXAM: PORTABLE ABDOMEN - 1 VIEW COMPARISON:  None. FINDINGS: Nasogastric tube enters the stomach, extends to the midbody in than doubles back upon itself. Gas pattern is unremarkable. IMPRESSION: Nasogastric tube enters the stomach, extends to the mid body and than doubles back upon itself. Electronically Signed   By: Nelson Chimes M.D.   On:  02/02/2019 12:27   EEG adult  Result Date: 02/02/2019 Lora Havens, MD     02/02/2019 12:21 PM Patient Name: MARKAY DORSCHNER MRN: [id number] Epilepsy Attending: Lora Havens Referring Physician/Provider: Dr. Kathrynn Speed Date: 02/02/2019 Duration: 24.05 minutes Patient history: 86 year old female who initially presented to the emergency room after a fall and was noted to be in sepsis on arrival.  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  EEG to evaluate for seizures. Level of alertness: Comatose AEDs during EEG study: Keppra, propofol Technical aspects: This EEG study was done with scalp electrodes positioned according to the 10-20 International system of electrode placement. Electrical activity was acquired at a sampling rate of 500Hz  and reviewed with a high frequency filter of 70Hz  and a low frequency filter of 1Hz . EEG data were recorded continuously and digitally stored. Description: EEG showed continuous generalized 3 to 6 Hz low voltage theta-delta slowing.  During the study, noxious stimuli was applied to patient's left shoulder and left hand after which patient was noted to have left hand tremor like movement lasting for few seconds.  This was reproducible on the left hand after stimulation and again when stimulation was applied to the right hand.  Concomitant EEG showed 2 to 5 Hz rhythmic theta-delta slowing without any evolution.  Hyperventilation and photic stimulation were not performed. Abnormalities -Continuous slow, generalized IMPRESSION: This study is suggestive of severe diffuse encephalopathy, nonspecific to etiology but could be secondary to sedation.  Episodes of left and right hand tremor-like movement on stimulation was recorded during the study without concomitant EEG change to suggest seizure.  No seizures or  epileptiform discharges were seen throughout the recording.   ECHOCARDIOGRAM COMPLETE  Result Date: 02/02/2019   ECHOCARDIOGRAM REPORT   Patient Name:   KIAMESHA HOLIHAN Date of Exam: 02/02/2019 Medical Rec #:  [id number]       Height:       62.0 in Accession #:    [id number]      Weight:       140.7 lb Date of Birth:  1928-04-06       BSA:          1.65 m Patient Age:    14 years        BP:           118/69 mmHg Patient Gender: F               HR:           68 bpm. Exam Location:  Inpatient Procedure: 2D Echo, Cardiac Doppler and Color Doppler Indications:    Shock  History:        Patient  has no prior history of Echocardiogram examinations.                 Signs/Symptoms:Syncope; Risk Factors:Non-Smoker. Septic shock.  Sonographer:    Paulita Fujita RDCS Referring Phys: [id number] [place]  Sonographer Comments: Echo performed with patient supine and on artificial respirator. IMPRESSIONS  1. Left ventricular ejection fraction, by visual estimation, is 60 to 65%. The left ventricle has normal function. There is no left ventricular hypertrophy.  2. Left ventricular diastolic parameters are consistent with Grade I diastolic dysfunction (impaired relaxation).  3. The left ventricle has no regional wall motion abnormalities.  4. Global right ventricle has normal systolic function.The right ventricular size is normal. No increase in right ventricular wall thickness.  5. Left atrial size was mildly dilated.  6. Right atrial size was normal.  7. The mitral valve is normal in structure. Mild mitral valve regurgitation. No evidence of mitral stenosis.  8. The tricuspid valve is normal in structure.  9. The tricuspid valve is normal in structure. Tricuspid valve regurgitation is moderate-severe. 10. The aortic valve is normal in structure. Aortic valve regurgitation is not visualized. No evidence of aortic valve sclerosis or stenosis. 11. The pulmonic valve was normal in structure. Pulmonic valve regurgitation is not  visualized. 12. Mildly elevated pulmonary artery systolic pressure. 13. The tricuspid regurgitant velocity is 2.45 m/s, and with an assumed right atrial pressure of 15 mmHg, the estimated right ventricular systolic pressure is mildly elevated at 38.9 mmHg. 14. The inferior vena cava is dilated in size with <50% respiratory variability, suggesting right atrial pressure of 15 mmHg. FINDINGS  Left Ventricle: Left ventricular ejection fraction, by visual estimation, is 60 to 65%. The left ventricle has normal function. The left ventricle has no regional wall motion abnormalities. There is no left ventricular hypertrophy. Left ventricular diastolic parameters are consistent with Grade I diastolic dysfunction (impaired relaxation). Normal left atrial pressure. Right Ventricle: The right ventricular size is normal. No increase in right ventricular wall thickness. Global RV systolic function is has normal systolic function. The tricuspid regurgitant velocity is 2.45 m/s, and with an assumed right atrial pressure  of 15 mmHg, the estimated right ventricular systolic pressure is mildly elevated at 38.9 mmHg. Left Atrium: Left atrial size was mildly dilated. Right Atrium: Right atrial size was normal in size Pericardium: There is no evidence of pericardial effusion. Mitral Valve: The mitral valve is normal in structure. Mild mitral valve regurgitation. No evidence of mitral valve stenosis by observation. Tricuspid Valve: The tricuspid valve is normal in structure. Tricuspid valve regurgitation is moderate-severe. Aortic Valve: The aortic valve is normal in structure. Aortic valve regurgitation is not visualized. The aortic valve is structurally normal, with no evidence of sclerosis or stenosis. Pulmonic Valve: The pulmonic valve was normal in structure. Pulmonic valve regurgitation is not visualized. Pulmonic regurgitation is not visualized. Aorta: The aortic root, ascending aorta and aortic arch are all structurally normal,  with no evidence of dilitation or obstruction. Venous: The inferior vena cava is dilated in size with less than 50% respiratory variability, suggesting right atrial pressure of 15 mmHg. IAS/Shunts: No atrial level shunt detected by color flow Doppler. There is no evidence of a patent foramen ovale. No ventricular septal defect is seen or detected. There is no evidence of an atrial septal defect.  LEFT VENTRICLE PLAX 2D LVIDd:         4.00 cm       Diastology LVIDs:  3.00 cm       LV e' lateral:   9.25 cm/s LV PW:         0.90 cm       LV E/e' lateral: 7.3 LV IVS:        0.90 cm       LV e' medial:    7.29 cm/s LVOT diam:     1.70 cm       LV E/e' medial:  9.2 LV SV:         35 ml LV SV Index:   20.77 LVOT Area:     2.27 cm  LV Volumes (MOD) LV area d, A2C:    30.50 cm LV area d, A4C:    30.80 cm LV area s, A2C:    15.80 cm LV area s, A4C:    16.10 cm LV major d, A2C:   7.73 cm LV major d, A4C:   8.12 cm LV major s, A2C:   6.17 cm LV major s, A4C:   6.89 cm LV vol d, MOD A2C: 101.0 ml LV vol d, MOD A4C: 96.7 ml LV vol s, MOD A2C: 34.0 ml LV vol s, MOD A4C: 32.3 ml LV SV MOD A2C:     67.0 ml LV SV MOD A4C:     96.7 ml LV SV MOD BP:      66.8 ml RIGHT VENTRICLE RV S prime:     13.20 cm/s TAPSE (M-mode): 2.6 cm LEFT ATRIUM             Index       RIGHT ATRIUM           Index LA diam:        3.20 cm 1.94 cm/m  RA Area:     13.10 cm LA Vol (A2C):   56.1 ml 34.08 ml/m RA Volume:   29.60 ml  17.98 ml/m LA Vol (A4C):   64.0 ml 38.88 ml/m LA Biplane Vol: 61.8 ml 37.54 ml/m  AORTIC VALVE LVOT Vmax:   111.00 cm/s LVOT Vmean:  73.300 cm/s LVOT VTI:    0.249 m  AORTA Ao Root diam: 2.80 cm MITRAL VALVE                        TRICUSPID VALVE MV Area (PHT): 2.87 cm             TR Peak grad:   23.9 mmHg MV PHT:        76.56 msec           TR Vmax:        264.00 cm/s MV Decel Time: 264 msec MR Peak grad: 93.7 mmHg             SHUNTS MR Vmax:      484.00 cm/s           Systemic VTI:  0.25 m MV E velocity: 67.30 cm/s  103 cm/s  Systemic Diam: 1.70 cm MV A velocity: 68.10 cm/s 70.3 cm/s MV E/A ratio:  0.99       1.5  Candee Furbish MD Electronically signed by Candee Furbish MD Signature Date/Time: 02/02/2019/1:50:04 PM    Final    US Abdomen Limited RUQ  Result Date: 02/02/2019 CLINICAL DATA:  Elevated LFTs. EXAM: ULTRASOUND ABDOMEN LIMITED RIGHT UPPER QUADRANT COMPARISON:  None. FINDINGS: Gallbladder: Physiologically distended. No gallstones or wall thickening visualized. No sonographic Murphy sign noted by sonographer. Common bile duct: Diameter: Difficult to  delineate, tentatively visualized measuring 9 mm. Liver: Simple cyst in the right lobe measures 1.7 x 1.2 x 1.7 cm. No evidence of solid lesion. Borderline increased in parenchymal echogenicity. Portal vein is patent on color Doppler imaging with normal direction of blood flow towards the liver. Other: No right upper quadrant ascites/free fluid. IMPRESSION: 1. Normal sonographic appearance of the gallbladder. 2. Question of mild biliary dilatation for age, common bile duct measures 9 mm. Please note the common bile duct is not well-defined sonographically. 3. Simple cyst in the right lobe of the liver. Borderline steatosis. Electronically Signed   By: Keith Rake M.D.   On: 02/02/2019 04:51     Not all labs, radiology exams or other studies done during hospitalization come through on my EPIC note; however they are reviewed by me.    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-noted on MRI 1/23; supportive care, due to thrombocytopenia aspirin could not be started then but has been started now SNF-continue ASA 81 mg daily  Acute encephalopathy/severe dementia/insomnia -up to UTI followed by HCAP; baseline apparently confused SNF-continue Aricept 5 mg daily and Seroquel 25 mg nightly; continue melatonin 3 mg nightly for sleep  Breast cancer SNF-continue anastrozole 1 mg daily  Left knee swelling and pain-treated for arthritis with intra-articular Depo-Medrol by Ortho on 1/28 SNF-continue supportive care  Right heel stage II pressure injury SNF-Prevalon boot, wound care   Time spent greater than 45 minutes;> 50% of time with patient was spent reviewing records, labs, tests and studies, counseling and developing plan of care  [name], MD

## 2019-02-17 ENCOUNTER — Encounter: Payer: Self-pay | Admitting: Internal Medicine

## 2019-02-17 DIAGNOSIS — B9689 Other specified bacterial agents as the cause of diseases classified elsewhere: Secondary | ICD-10-CM | POA: Insufficient documentation

## 2019-02-17 DIAGNOSIS — B961 Klebsiella pneumoniae [K. pneumoniae] as the cause of diseases classified elsewhere: Secondary | ICD-10-CM | POA: Insufficient documentation

## 2019-02-17 DIAGNOSIS — I6381 Other cerebral infarction due to occlusion or stenosis of small artery: Secondary | ICD-10-CM | POA: Insufficient documentation

## 2019-02-17 DIAGNOSIS — I639 Cerebral infarction, unspecified: Secondary | ICD-10-CM

## 2019-02-17 HISTORY — DX: Cerebral infarction, unspecified: I63.9

## 2019-03-14 ENCOUNTER — Encounter: Payer: Self-pay | Admitting: Internal Medicine

## 2019-03-14 ENCOUNTER — Non-Acute Institutional Stay (SKILLED_NURSING_FACILITY): Payer: Medicare HMO | Admitting: Internal Medicine

## 2019-03-14 DIAGNOSIS — R569 Unspecified convulsions: Secondary | ICD-10-CM | POA: Diagnosis not present

## 2019-03-14 DIAGNOSIS — I639 Cerebral infarction, unspecified: Secondary | ICD-10-CM | POA: Diagnosis not present

## 2019-03-14 DIAGNOSIS — J189 Pneumonia, unspecified organism: Secondary | ICD-10-CM

## 2019-03-14 DIAGNOSIS — G309 Alzheimer's disease, unspecified: Secondary | ICD-10-CM

## 2019-03-14 DIAGNOSIS — M1711 Unilateral primary osteoarthritis, right knee: Secondary | ICD-10-CM

## 2019-03-14 DIAGNOSIS — F0280 Dementia in other diseases classified elsewhere without behavioral disturbance: Secondary | ICD-10-CM

## 2019-03-14 NOTE — Progress Notes (Signed)
Location:    [facility] Room Number: 101/P Place of Service:  SNF (31) Provider:  Leda Min, MD  Patient Care Team: [name], MD as PCP - General (Internal Medicine) Magrinat, Virgie Dad, MD as Consulting Physician (Oncology) Magrinat, Virgie Dad, MD as Consulting Physician (Oncology) Susa Day, MD as Consulting Physician (Orthopedic Surgery) Stark Klein, MD as Consulting Physician (General Surgery) Gery Pray, MD as Consulting Physician (Radiation Oncology)  Extended Emergency Contact Information Primary Emergency Contact: Ibrahim,[name] Address: [address]          [place],  [address] [place] Phone: [phone] Mobile Phone: [phone] Relation: Daughter Secondary Emergency Contact: june, feldberg Mobile Phone: [phone] Relation: Son  Code Status:  Full Code Goals of care: Advanced Directive information Advanced Directives 03/14/2019  Does Patient Have a Medical Advance Directive? Yes  Type of Advance Directive (No Data)  Does patient want to make changes to medical advance directive? No - Patient declined  Copy of [place] in Chart? -  Would patient like information on creating a medical advance directive? -     Chief Complaint  Patient presents with   Medical Management of Chronic Issues    Routine visit of medical management   Medical management of chronic medical conditions including recent history of sepsis secondary to UTI-healthcare associated pneumonia-recent CVA-seizure disorder-history of dementia- HPI:  Pt is a 86 y.o. female seen today for medical management of chronic diseases.  As noted above.  Patient had a recent complicated hospital stay-patient had worsening confusion and increased weakness-she presented to the emergency room with a white count of 1.1.  She was admitted for neutropenia and hypothermia.  In the  hospital she appeared to have a seizure-had a CBG of 23 and a systolic blood pressure of 60.  She was intubated neurologic consult was obtained and an MRI showed a punctate acute-early subacute infarct in the right thalamus right external capsule and right cerebellum.  She was treated for septic shock due to UTI extubated and transferred back to the floor.  But she had a change in condition and was found to have healthcare associated pneumonia.  She was started on vancomycin and cefepime I am.  She has improved clinically and is now in skilled nursing for PT and OT nursing does not report any issues her stay here has been fairly unremarkable.  She does have a history of breast cancer and continues on anastrozole-her dementia is treated with Aricept and she does have nighttime insomnia and continues on Seroquel.  Currently she is sitting in her chair comfortably does not really have any complaints she is a pleasant appropriate with evidence of dementia   Past Medical History:  Diagnosis Date   Acute cerebrovascular accident (CVA) of cerebellum ([place]) 02/17/2019   Bilateral knee pain    Breast cancer ([place])    diagnosed 11/2017   Dementia ([place])    Osteoporosis    Seizures ([place]) 02/06/2019   Urge incontinence of urine    Past Surgical History:  Procedure Laterality Date   BREAST LUMPECTOMY Right 2001   BREAST LUMPECTOMY Right 12/2017   BREAST LUMPECTOMY WITH SENTINEL LYMPH NODE BIOPSY Right 2001   BREAST LUMPECTOMY WITH SENTINEL LYMPH NODE BIOPSY Right 12/30/2017   Procedure: RIGHT BREAST LUMPECTOMY WITH SENTINEL LYMPH NODE BIOPSY ERAS PATHWAY;  Surgeon: Stark Klein, MD;  Location: [facility];  Service: General;  Laterality: Right;   DENTAL  SURGERY      Allergies  Allergen Reactions   Milk-Related Compounds Diarrhea   Pork-Derived Products Other (See Comments)    PATIENT DOES NOT EAT PORK    Allergies as of 03/14/2019      Reactions   Milk-related Compounds Diarrhea    Pork-derived Products Other (See Comments)   PATIENT DOES NOT EAT PORK      Medication List       Accurate as of March 14, 2019  3:00 PM. If you have any questions, ask your nurse or doctor.        acetaminophen 325 MG tablet Commonly known as: TYLENOL Take 2 tablets (650 mg total) by mouth every 6 (six) hours as needed for mild pain (or Fever >/= 101).   anastrozole 1 MG tablet Commonly known as: ARIMIDEX Take 1 tablet (1 mg total) by mouth daily.   aspirin EC 81 MG tablet Take 81 mg by mouth daily.   bisacodyl 10 MG suppository Commonly known as: DULCOLAX If not relieved by MOM, give 10 mg Bisacodyl suppositiory rectally X 1 dose in 24 hours as needed (Do not use constipation standing orders for residents with renal failure/CFR less than 30. Contact MD for orders) (Physician Order)   cholecalciferol 25 MCG (1000 UNIT) tablet Commonly known as: VITAMIN D3 Take 1 tablet (1,000 Units total) by mouth daily.   donepezil 5 MG tablet Commonly known as: ARICEPT TAKE 1 TABLET (5 MG TOTAL) BY MOUTH AT BEDTIME. FOR ALZHEIMER'S DISEASE   feeding supplement (ENSURE ENLIVE) Liqd Take 237 mLs by mouth 3 (three) times daily between meals.   levETIRAcetam 500 MG tablet Commonly known as: KEPPRA Take 1 tablet (500 mg total) by mouth 2 (two) times daily.   magnesium hydroxide 400 MG/5ML suspension Commonly known as: MILK OF MAGNESIA If no BM in 3 days, give 30 cc Milk of Magnesium p.o. x 1 dose in 24 hours as needed (Do not use standing constipation orders for residents with renal failure CFR less than 30. Contact MD for orders) (Physician Order)   MELATONIN PO Take 1 tablet by mouth at bedtime as needed (for sleep).   multivitamin with minerals Tabs tablet Take 1 tablet by mouth daily.   omeprazole 20 MG capsule Commonly known as: PRILOSEC Take 20 mg by mouth daily.   QUEtiapine 25 MG tablet Commonly known as: SEROQUEL TAKE 1 TABLET BY MOUTH EVERYDAY AT BEDTIME   RA SALINE  ENEMA RE If not relieved by Biscodyl suppository, give disposable Saline Enema rectally X 1 dose/24 hrs as needed (Do not use constipation standing orders for residents with renal failure/CFR less than 30. Contact MD for orders)(Physician Or   sertraline 25 MG tablet Commonly known as: ZOLOFT Take 1 tablet (25 mg total) by mouth daily.   traMADol 50 MG tablet Commonly known as: ULTRAM Take 1 tablet (50 mg total) by mouth every 6 (six) hours as needed for moderate pain.   Voltaren 1 % Gel Generic drug: diclofenac Sodium Apply 2-4 g topically See admin instructions. Apply 2-4 grams to both knees once daily for pain       Review of Systems  Is unobtainable secondary to dementia please see HPI nursing does not report any recent acute concerns  Immunization History  Administered Date(s) Administered   Fluad Quad(high Dose 65+) 12/05/2018   Pertinent  Health Maintenance Due  Topic Date Due   PNA vac Low Risk Adult (1 of 2 - PCV13) 02/07/1995   INFLUENZA VACCINE  Completed  DEXA SCAN  Completed   Fall Risk  09/21/2018 03/31/2017  Falls in the past year? 1 Yes  Number falls in past yr: 1 1  Injury with Fall? 0 No  Risk for fall due to : History of fall(s);Medication side effect -  Follow up Falls evaluation completed;Falls prevention discussed;Education provided -   Functional Status Survey:    Vitals:   03/14/19 1447  BP: (!) 141/82  Pulse: 96  Resp: 18  Temp: 98.4 F (36.9 C)  TempSrc: Oral  SpO2: 99%  Weight: 158 lb 9.6 oz (71.9 kg)  Height: 5\' 2"  (1.575 m)   Body mass index is 29.01 kg/m. Physical Exam   In general this is a pleasant elderly female in no distress.  Her skin is warm and dry.  Eyes visual acuity appears to be intact sclera and conjunctive are clear.  Oropharynx is clear mucous membranes moist.  Chest is clear to auscultation with somewhat shallow air entry there is no labored breathing.  Heart is regular rate and rhythm without murmur  gallop or rub she does have some mild lower extremity edema this is cool to touch nonerythematous nontender.  Abdomen is soft nontender with positive bowel sounds.  Musculoskeletal appears able to move all extremities x4 does have some edema changes of her lower extremities and some stiffness more so of the left knee area.  Neurologic she is alert could not appreciate lateralizing findings her speech is clear.  Psych findings: Consistent with dementia.  She is pleasant and cooperative  Labs reviewed:  February 14, 2019.  WBC 6.3 hemoglobin 9.2 platelets 278.  Sodium 141 potassium 5 BUN 14.3 creatinine 0.5.   Recent Labs    02/01/19 2346 02/02/19 0302 02/03/19 0312 02/04/19 0230 02/05/19 0217 02/06/19 0559 02/11/19 0318 02/12/19 0435 02/13/19 0256  NA   < >  --  142   < > 137   < > 139 139 138  K   < >  --  4.3   < > 4.0   < > 4.6 4.2 4.4  CL   < >  --  112*   < > 108   < > 109 106 106  CO2   < >  --  24   < > 22   < > 21* 24 24  GLUCOSE   < >  --  119*   < > 110*   < > 106* 107* 110*  BUN   < >  --  29*   < > 33*   < > 26* 15 15  CREATININE   < >  --  1.05*   < > 0.83   < > 0.80 0.68 0.71  CALCIUM   < >  --  8.6*   < > 8.6*   < > 9.1 9.4 9.0  MG  --  1.6* 2.1  --  1.9  --   --   --   --   PHOS  --  2.6  --   --   --   --   --   --   --    < > = values in this interval not displayed.   Recent Labs    02/04/19 0230 02/05/19 0217 02/06/19 0559  AST 111* 87* 191*  ALT 129* 111* 184*  ALKPHOS 106 96 121  BILITOT 0.9 0.4 0.8  PROT 4.9* 4.8* 5.7*  ALBUMIN 2.1* 2.0* 2.3*   Recent Labs    02/05/19 1255 02/05/19  1255 02/07/19 0227 02/07/19 0227 02/08/19 0814 02/09/19 0004 02/11/19 0318 02/12/19 0435 02/13/19 0256  WBC 3.9*   < > 3.7*   < > 4.2   < > 4.5 4.3 5.1  NEUTROABS 2.5  --  2.3  --  2.7  --   --   --   --   HGB 9.0*   < > 8.4*   < > 8.6*   < > 9.6* 8.9* 8.3*  HCT 27.9*   < > 25.4*   < > 26.1*   < > 28.8* 26.7* 25.4*  MCV 102.2*   < > 100.4*   < >  100.8*   < > 99.0 98.9 99.6  PLT 59*   < > 81*   < > 72*   < > 243 233 242   < > = values in this interval not displayed.   Lab Results  Component Value Date   TSH 2.742 02/09/2019   Lab Results  Component Value Date   HGBA1C 5.6 02/02/2019   Lab Results  Component Value Date   TRIG 24 02/05/2019    Significant Diagnostic Results in last 30 days:  No results found.  Assessment/Plan  #1 history of sepsis secondary to Klebsiella UTI as well as healthcare associated pneumonia with hypotension and hypothermia in the hospital-she did require intubation and extubation secondary to his seizure-she completed a course of Rocephin for UTI and vancomycin and cefepime I am for a pneumonia pancytopenia resolved.  We will update a CBC this appears to be stable however.  2.  History of seizure disorder as noted above Keppra was increased from 250 twice daily to 500 mg twice daily at this point appears stable no recent seizures to my knowledge during her stay here.  3.  History of CVA with the right thalamus right internal capsule and right cerebellum-continue supportive care because of her thrombocytopenia aspirin could not be restarted but recommendation was to restart on discharge -I do not currently see it on her medication list will write an order for this   #4-history of acute encephalopathy with severe dementia and insomnia-thought multifactorial with UTI-pneumonia-baseline dementia-at this point she is on Aricept 5 mg a day and does have Seroquel 25 mg at night-she also has orders for low-dose melatonin.  5.-History of breast cancer she continues on anastrozole 1 mg daily.  6.  History of knee swelling and pain-she did receive a Depo-Medrol injection by orthopedics in late January at this point continue supportive care she does have orders for Voltaren gel daily as well as tramadol 50 mg every 6 hours as needed.  7.  Depression she continues on Zoloft 25 mg a day.  [id number]

## 2019-03-15 DIAGNOSIS — I10 Essential (primary) hypertension: Secondary | ICD-10-CM | POA: Diagnosis not present

## 2019-03-15 DIAGNOSIS — D649 Anemia, unspecified: Secondary | ICD-10-CM | POA: Diagnosis not present

## 2019-03-18 ENCOUNTER — Encounter: Payer: Self-pay | Admitting: Internal Medicine

## 2019-03-20 ENCOUNTER — Other Ambulatory Visit: Payer: Self-pay

## 2019-03-20 NOTE — Patient Outreach (Signed)
[facility]) Care Management  03/20/2019  Jocelyn Mccann 21-Oct-1928 [id number]     Transition of Care Referral  Referral Date: 03/20/2019 Referral Source: [employer] Discharge Report Date of Discharge: 03/18/2019 Facility: [place]: [facility]   Referral received. Transition of care calls being completed via EMMI-automated calls. RN CM will outreach patient for any red flags received.    Plan: RN CM will close case at this time.    Enzo Montgomery, RN,BSN,CCM [facility] Telephonic Care Management Coordinator Direct Phone: [phone] Toll Free: [phone] Fax: [phone]

## 2019-04-03 ENCOUNTER — Non-Acute Institutional Stay (SKILLED_NURSING_FACILITY): Payer: Medicare HMO | Admitting: Internal Medicine

## 2019-04-03 DIAGNOSIS — Z8659 Personal history of other mental and behavioral disorders: Secondary | ICD-10-CM

## 2019-04-03 DIAGNOSIS — I10 Essential (primary) hypertension: Secondary | ICD-10-CM

## 2019-04-03 NOTE — Progress Notes (Signed)
Location:  [facility] Room Number: 417-D Place of Service:  SNF (31)  [name], MD  Patient Care Team: [name], MD as PCP - General (Internal Medicine) Magrinat, Virgie Dad, MD as Consulting Physician (Oncology) Magrinat, Virgie Dad, MD as Consulting Physician (Oncology) Susa Day, MD as Consulting Physician (Orthopedic Surgery) Stark Klein, MD as Consulting Physician (General Surgery) Gery Pray, MD as Consulting Physician (Radiation Oncology)  Extended Emergency Contact Information Primary Emergency Contact: Ibrahim,[name] Address: [address]          [place], [place] [address] Johnnette Litter of [place] Phone: [phone] Mobile Phone: [phone] Relation: Daughter Secondary Emergency Contact: nolana, farinacci Mobile Phone: [phone] Relation: Son    Allergies: Milk-related compounds and Pork-derived products  Chief Complaint  Patient presents with  . Acute Visit    Patient is seen for mental status changes    HPI: Patient is 86 y.o. female who nursing asked me to see for mental status changes.  Patient has been reported to be more sleepy over the weekend and she was more sleepy today as well.  Also reported as patient systolic blood pressure was 190 and her heart rate is 115.  Repeat blood pressure is 184/95 with a pulse of 95 patient's O2 saturation is 95% and patient is afebrile   Past Medical History:  Diagnosis Date  . Acute cerebrovascular accident (CVA) of cerebellum ([place]) 02/17/2019  . Bilateral knee pain   . Breast cancer ([place])    diagnosed 11/2017  . Dementia ([place])   . Osteoporosis   . Seizures ([place]) 02/06/2019  . Urge incontinence of urine     Past Surgical History:  Procedure Laterality Date  . BREAST LUMPECTOMY Right 2001  . BREAST LUMPECTOMY Right 12/2017  . BREAST LUMPECTOMY WITH SENTINEL LYMPH NODE BIOPSY Right 2001  . BREAST LUMPECTOMY WITH SENTINEL LYMPH NODE BIOPSY Right  12/30/2017   Procedure: RIGHT BREAST LUMPECTOMY WITH SENTINEL LYMPH NODE BIOPSY ERAS PATHWAY;  Surgeon: Stark Klein, MD;  Location: [facility];  Service: General;  Laterality: Right;  . DENTAL SURGERY      Allergies as of 04/03/2019      Reactions   Milk-related Compounds Diarrhea   Pork-derived Products Other (See Comments)   PATIENT DOES NOT EAT PORK      Medication List       Accurate as of April 03, 2019 11:59 PM. If you have any questions, ask your nurse or doctor.        STOP taking these medications   MELATONIN PO     TAKE these medications   acetaminophen 325 MG tablet Commonly known as: TYLENOL Take 2 tablets (650 mg total) by mouth every 6 (six) hours as needed for mild pain (or Fever >/= 101).   anastrozole 1 MG tablet Commonly known as: ARIMIDEX Take 1 tablet (1 mg total) by mouth daily.   aspirin EC 81 MG tablet Take 81 mg by mouth daily.   bisacodyl 10 MG suppository Commonly known as: DULCOLAX Place 10 mg rectally daily as needed for moderate constipation. If not relieved by MOM, give 10 mg Bisacodyl suppositiory rectally X 1 dose in 24 hours as needed (Do not use constipation standing orders for residents with renal failure/CFR less than 30. Contact MD for orders) (Physician Order)   cholecalciferol 25 MCG (1000 UNIT) tablet Commonly known as: VITAMIN D3 Take 1 tablet (1,000 Units total) by mouth daily.   donepezil 5 MG tablet Commonly  known as: ARICEPT TAKE 1 TABLET (5 MG TOTAL) BY MOUTH AT BEDTIME. FOR ALZHEIMER'S DISEASE   feeding supplement (ENSURE ENLIVE) Liqd Take 237 mLs by mouth 3 (three) times daily between meals.   levETIRAcetam 500 MG tablet Commonly known as: KEPPRA Take 1 tablet (500 mg total) by mouth 2 (two) times daily.   magnesium hydroxide 400 MG/5ML suspension Commonly known as: MILK OF MAGNESIA If no BM in 3 days, give 30 cc Milk of Magnesium p.o. x 1 dose in 24 hours as needed (Do not use standing constipation orders for  residents with renal failure CFR less than 30. Contact MD for orders) (Physician Order)   multivitamin with minerals Tabs tablet Take 1 tablet by mouth daily.   omeprazole 20 MG capsule Commonly known as: PRILOSEC Take 20 mg by mouth daily.   QUEtiapine 25 MG tablet Commonly known as: SEROQUEL TAKE 1 TABLET BY MOUTH EVERYDAY AT BEDTIME   RA SALINE ENEMA RE If not relieved by Biscodyl suppository, give disposable Saline Enema rectally X 1 dose/24 hrs as needed (Do not use constipation standing orders for residents with renal failure/CFR less than 30. Contact MD for orders)(Physician Or   sertraline 25 MG tablet Commonly known as: ZOLOFT Take 1 tablet (25 mg total) by mouth daily.   traMADol 50 MG tablet Commonly known as: ULTRAM Take 1 tablet (50 mg total) by mouth every 6 (six) hours as needed for moderate pain.   Voltaren 1 % Gel Generic drug: diclofenac Sodium 2 g. Apply 2 grams to both knees once daily for pain       No orders of the defined types were placed in this encounter.   Immunization History  Administered Date(s) Administered  . Fluad Quad(high Dose 65+) 12/05/2018    Social History   Tobacco Use  . Smoking status: Never Smoker  . Smokeless tobacco: Never Used  Substance Use Topics  . Alcohol use: Never    Review of Systems   nursing-negative except as per history of present illness     Vitals:   04/03/19 1520  BP: (!) 191/113  Pulse: (!) 113  Resp: 16  Temp: (!) 96.9 F (36.1 C)  SpO2: 98%   Body mass index is 29.01 kg/m. Physical Exam  GENERAL APPEARANCE: Sleepy no acute distress, says a few words SKIN: No diaphoresis rash HEENT: Unremarkable RESPIRATORY: Breathing is even, unlabored. Lung sounds are clear   CARDIOVASCULAR: Heart RRR no murmurs, rubs or gallops. No peripheral edema  GASTROINTESTINAL: Abdomen is soft, non-tender, not distended w/ normal bowel sounds.  GENITOURINARY: Bladder non tender, not distended   MUSCULOSKELETAL: No abnormal joints or musculature NEUROLOGIC: Cranial nerves 2-12 grossly intact. Moves all extremities PSYCHIATRIC: Mood and affect not assessable secondary to sleepy state, no behavioral issues  Patient Active Problem List   Diagnosis Date Noted  . Intracranial hemorrhage ([place]) 04/04/2019  . Klebsiella cystitis 02/17/2019  . Cerebrovascular accident (CVA) of right thalamus ([place]) 02/17/2019  . Acute cerebrovascular accident (CVA) of cerebellum ([place]) 02/17/2019  . HCAP (healthcare-associated pneumonia) 02/09/2019  . Sepsis ([place]) 02/09/2019  . Pressure injury of skin 02/07/2019  . Hypoglycemia without diagnosis of diabetes mellitus 02/06/2019  . Seizures ([place]) 02/06/2019  . Pancytopenia ([place]) 02/06/2019  . Palliative care by specialist   . Goals of care, counseling/discussion   . Acute encephalopathy   . Dysphagia   . SIRS (systemic inflammatory response syndrome) ([place]) 02/01/2019  . Syncope 12/05/2018  . SDH (subdural hematoma) ([place]) 08/21/2018  . Malignant neoplasm  of upper-outer quadrant of right breast in female, estrogen receptor positive ([place]) 12/20/2017  . Breast cancer ([place])   . Osteoarthritis of knee 06/01/2017  . Bilateral knee pain 03/31/2017  . Urge incontinence of urine 03/31/2017  . Generalized anxiety disorder 03/31/2017  . Osteoporosis without current pathological fracture 03/31/2017  . Alzheimer disease ([place]) 03/31/2017    CMP     Component Value Date/Time   NA 143 04/05/2019 0435   K 3.4 (L) 04/05/2019 0435   CL 107 04/05/2019 0435   CO2 25 04/05/2019 0435   GLUCOSE 144 (H) 04/05/2019 0435   BUN 21 04/05/2019 0435   CREATININE 0.64 04/05/2019 0435   CREATININE 0.85 03/31/2017 1144   CALCIUM 9.4 04/05/2019 0435   PROT 7.5 04/04/2019 1646   ALBUMIN 3.1 (L) 04/04/2019 1646   AST 38 04/04/2019 1646   ALT 49 (H) 04/04/2019 1646   ALKPHOS 84 04/04/2019 1646   BILITOT 0.7 04/04/2019 1646   GFRNONAA >60 04/05/2019 0435   GFRNONAA 62  03/31/2017 1144   GFRAA >60 04/05/2019 0435   GFRAA 71 03/31/2017 1144   Recent Labs    02/01/19 2346 02/02/19 0302 02/03/19 0312 02/04/19 0230 02/05/19 0217 02/06/19 0559 02/13/19 0256 04/04/19 1646 04/05/19 0435  NA   < >  --  142   < > 137   < > 138 144 143  K   < >  --  4.3   < > 4.0   < > 4.4 3.4* 3.4*  CL   < >  --  112*   < > 108   < > 106 108 107  CO2   < >  --  24   < > 22   < > 24 28 25   GLUCOSE   < >  --  119*   < > 110*   < > 110* 109* 144*  BUN   < >  --  29*   < > 33*   < > 15 32* 21  CREATININE   < >  --  1.05*   < > 0.83   < > 0.71 0.89 0.64  CALCIUM   < >  --  8.6*   < > 8.6*   < > 9.0 9.5 9.4  MG  --  1.6* 2.1  --  1.9  --   --   --   --   PHOS  --  2.6  --   --   --   --   --   --   --    < > = values in this interval not displayed.   Recent Labs    02/05/19 0217 02/06/19 0559 04/04/19 1646  AST 87* 191* 38  ALT 111* 184* 49*  ALKPHOS 96 121 84  BILITOT 0.4 0.8 0.7  PROT 4.8* 5.7* 7.5  ALBUMIN 2.0* 2.3* 3.1*   Recent Labs    02/07/19 0227 02/07/19 0227 02/08/19 0814 02/09/19 0004 02/13/19 0256 04/04/19 1646 04/05/19 0435  WBC 3.7*   < > 4.2   < > 5.1 6.7 8.5  NEUTROABS 2.3  --  2.7  --   --  4.1  --   HGB 8.4*   < > 8.6*   < > 8.3* 13.2 12.9  HCT 25.4*   < > 26.1*   < > 25.4* 41.2 40.3  MCV 100.4*   < > 100.8*   < > 99.6 99.5 98.3  PLT 81*   < > 72*   < >  242 261 261   < > = values in this interval not displayed.   Recent Labs    02/03/19 0312 02/04/19 0230 02/05/19 0217  TRIG 23 15 24    No results found for: [facility] Lab Results  Component Value Date   TSH 2.742 02/09/2019   Lab Results  Component Value Date   HGBA1C 5.6 02/02/2019   Lab Results  Component Value Date   TRIG 24 02/05/2019    Significant Diagnostic Results in last 30 days:  CT Head Wo Contrast  Result Date: 04/04/2019 CLINICAL DATA:  Encephalopathy. EXAM: CT HEAD WITHOUT CONTRAST TECHNIQUE: Contiguous axial images were obtained from the base of the  skull through the vertex without intravenous contrast. COMPARISON:  Head CT and brain MRI, 02/09/2019. FINDINGS: Brain: Acute hemorrhage fills the right lateral ventricle, as well as extending into the third ventricle and layering in the atrium the left lateral ventricle. The lateral ventricles enlarged, right greater than left, larger than on the prior CT. There is mild midline shift to the left of 4 mm, likely accentuated by the patient's positioning, head turned toward the left. There is no fencing parenchymal hemorrhage, although the images suspected to have arisen from the superior right thalamus. No evidence of subdural or subarachnoid hemorrhage. There are no parenchymal masses. No evidence of an acute ischemic infarct. There are no extra-axial masses. Patchy areas of white matter hypoattenuation are noted bilaterally consistent with mild chronic microvascular ischemic change. Vascular: No hyperdense vessel or unexpected calcification. Skull: Normal. Negative for fracture or focal lesion. Sinuses/Orbits: No acute finding. Other: None. IMPRESSION: 1. Acute intracranial hemorrhage with hemorrhage filling and distending the right lateral ventricle, as well as hemorrhage extending into the third ventricle and layering dependently in the atrium of the left lateral ventricle. There is lateral ventricular enlargement increased when compared to the prior CT suggesting mild hydrocephalus. The origin of the acute intraventricular hemorrhage is likely from the superior right thalamus, where areas of infarction were noted on the brain MRI from 02/03/2019. There is no visualized parenchymal hemorrhage and no evidence of subarachnoid or subdural hemorrhage. No ischemic infarction. Critical Value/emergent results were called by telephone at the time of interpretation on 04/04/2019 at 5:36 pm to provider [facility] , who verbally acknowledged these results. Electronically Signed   By: Lajean Manes M.D.   On: 04/04/2019  17:37   DG Chest Port 1 View  Result Date: 04/04/2019 CLINICAL DATA:  86 year old female with altered mental status. EXAM: PORTABLE CHEST 1 VIEW COMPARISON:  Chest radiograph dated 02/09/2019. FINDINGS: There is no focal consolidation, pleural effusion, pneumothorax. There is mild cardiomegaly. Atherosclerotic calcification of the aorta. Osteopenia with degenerative changes of the spine and shoulders. There is elevation of the humeral head, likely related to chronic rotator cuff injury. Surgical clips noted over the right chest wall. No acute osseous pathology. IMPRESSION: No acute cardiopulmonary process. Electronically Signed   By: Anner Crete M.D.   On: 04/04/2019 17:46    Assessment and Plan  Mental status change-CBC, CMP and UA with C&S is ordered; lung sounds are clear and O2 saturations 95% which is baseline so chest x-ray; better review of patient's blood pressures and they range anywhere from systolic of [id number] to systolic of 99991111 but most are above systolic blood pressure A999333; have started Norvasc 5 mg daily and will monitor response.     [name], MD

## 2019-04-04 ENCOUNTER — Encounter (HOSPITAL_COMMUNITY): Payer: Self-pay | Admitting: Emergency Medicine

## 2019-04-04 ENCOUNTER — Inpatient Hospital Stay (HOSPITAL_COMMUNITY)
Admission: EM | Admit: 2019-04-04 | Discharge: 2019-04-12 | DRG: 065 | Disposition: A | Discharge status: Hospice | Payer: Medicare HMO | Source: Skilled Nursing Facility | Attending: Internal Medicine | Admitting: Internal Medicine

## 2019-04-04 ENCOUNTER — Ambulatory Visit: Payer: Medicare HMO | Admitting: Family Medicine

## 2019-04-04 ENCOUNTER — Emergency Department (HOSPITAL_COMMUNITY): Payer: Medicare HMO

## 2019-04-04 ENCOUNTER — Encounter: Payer: Self-pay | Admitting: Internal Medicine

## 2019-04-04 ENCOUNTER — Other Ambulatory Visit: Payer: Self-pay

## 2019-04-04 ENCOUNTER — Non-Acute Institutional Stay (SKILLED_NURSING_FACILITY): Payer: Medicare HMO | Admitting: Internal Medicine

## 2019-04-04 DIAGNOSIS — Z515 Encounter for palliative care: Secondary | ICD-10-CM | POA: Diagnosis not present

## 2019-04-04 DIAGNOSIS — R5383 Other fatigue: Secondary | ICD-10-CM | POA: Diagnosis not present

## 2019-04-04 DIAGNOSIS — G309 Alzheimer's disease, unspecified: Secondary | ICD-10-CM

## 2019-04-04 DIAGNOSIS — G9340 Encephalopathy, unspecified: Secondary | ICD-10-CM | POA: Diagnosis not present

## 2019-04-04 DIAGNOSIS — I10 Essential (primary) hypertension: Secondary | ICD-10-CM | POA: Diagnosis not present

## 2019-04-04 DIAGNOSIS — E876 Hypokalemia: Secondary | ICD-10-CM | POA: Diagnosis present

## 2019-04-04 DIAGNOSIS — R4182 Altered mental status, unspecified: Secondary | ICD-10-CM

## 2019-04-04 DIAGNOSIS — G932 Benign intracranial hypertension: Secondary | ICD-10-CM | POA: Diagnosis present

## 2019-04-04 DIAGNOSIS — G301 Alzheimer's disease with late onset: Secondary | ICD-10-CM | POA: Diagnosis not present

## 2019-04-04 DIAGNOSIS — R296 Repeated falls: Secondary | ICD-10-CM | POA: Diagnosis present

## 2019-04-04 DIAGNOSIS — F0280 Dementia in other diseases classified elsewhere without behavioral disturbance: Secondary | ICD-10-CM | POA: Diagnosis not present

## 2019-04-04 DIAGNOSIS — C50411 Malignant neoplasm of upper-outer quadrant of right female breast: Secondary | ICD-10-CM | POA: Diagnosis not present

## 2019-04-04 DIAGNOSIS — Z803 Family history of malignant neoplasm of breast: Secondary | ICD-10-CM

## 2019-04-04 DIAGNOSIS — I615 Nontraumatic intracerebral hemorrhage, intraventricular: Secondary | ICD-10-CM | POA: Diagnosis not present

## 2019-04-04 DIAGNOSIS — Z20822 Contact with and (suspected) exposure to covid-19: Secondary | ICD-10-CM | POA: Diagnosis present

## 2019-04-04 DIAGNOSIS — Z03818 Encounter for observation for suspected exposure to other biological agents ruled out: Secondary | ICD-10-CM | POA: Diagnosis not present

## 2019-04-04 DIAGNOSIS — I499 Cardiac arrhythmia, unspecified: Secondary | ICD-10-CM | POA: Diagnosis not present

## 2019-04-04 DIAGNOSIS — K08409 Partial loss of teeth, unspecified cause, unspecified class: Secondary | ICD-10-CM | POA: Diagnosis present

## 2019-04-04 DIAGNOSIS — Z79899 Other long term (current) drug therapy: Secondary | ICD-10-CM

## 2019-04-04 DIAGNOSIS — R569 Unspecified convulsions: Secondary | ICD-10-CM

## 2019-04-04 DIAGNOSIS — Z79811 Long term (current) use of aromatase inhibitors: Secondary | ICD-10-CM

## 2019-04-04 DIAGNOSIS — Z8673 Personal history of transient ischemic attack (TIA), and cerebral infarction without residual deficits: Secondary | ICD-10-CM

## 2019-04-04 DIAGNOSIS — Z66 Do not resuscitate: Secondary | ICD-10-CM | POA: Diagnosis not present

## 2019-04-04 DIAGNOSIS — N390 Urinary tract infection, site not specified: Secondary | ICD-10-CM | POA: Diagnosis present

## 2019-04-04 DIAGNOSIS — Z9181 History of falling: Secondary | ICD-10-CM

## 2019-04-04 DIAGNOSIS — R531 Weakness: Secondary | ICD-10-CM | POA: Diagnosis not present

## 2019-04-04 DIAGNOSIS — M810 Age-related osteoporosis without current pathological fracture: Secondary | ICD-10-CM | POA: Diagnosis present

## 2019-04-04 DIAGNOSIS — F411 Generalized anxiety disorder: Secondary | ICD-10-CM | POA: Diagnosis present

## 2019-04-04 DIAGNOSIS — C50919 Malignant neoplasm of unspecified site of unspecified female breast: Secondary | ICD-10-CM | POA: Diagnosis present

## 2019-04-04 DIAGNOSIS — I629 Nontraumatic intracranial hemorrhage, unspecified: Secondary | ICD-10-CM

## 2019-04-04 DIAGNOSIS — Z7189 Other specified counseling: Secondary | ICD-10-CM | POA: Diagnosis not present

## 2019-04-04 DIAGNOSIS — I619 Nontraumatic intracerebral hemorrhage, unspecified: Secondary | ICD-10-CM | POA: Diagnosis not present

## 2019-04-04 DIAGNOSIS — Z87440 Personal history of urinary (tract) infections: Secondary | ICD-10-CM | POA: Diagnosis not present

## 2019-04-04 DIAGNOSIS — G40909 Epilepsy, unspecified, not intractable, without status epilepticus: Secondary | ICD-10-CM | POA: Diagnosis present

## 2019-04-04 DIAGNOSIS — Z170 Estrogen receptor positive status [ER+]: Secondary | ICD-10-CM | POA: Diagnosis not present

## 2019-04-04 DIAGNOSIS — G914 Hydrocephalus in diseases classified elsewhere: Secondary | ICD-10-CM | POA: Diagnosis not present

## 2019-04-04 DIAGNOSIS — Z7982 Long term (current) use of aspirin: Secondary | ICD-10-CM

## 2019-04-04 DIAGNOSIS — Z8619 Personal history of other infectious and parasitic diseases: Secondary | ICD-10-CM

## 2019-04-04 DIAGNOSIS — R404 Transient alteration of awareness: Secondary | ICD-10-CM | POA: Diagnosis not present

## 2019-04-04 DIAGNOSIS — B961 Klebsiella pneumoniae [K. pneumoniae] as the cause of diseases classified elsewhere: Secondary | ICD-10-CM | POA: Diagnosis present

## 2019-04-04 DIAGNOSIS — G9349 Other encephalopathy: Secondary | ICD-10-CM | POA: Diagnosis present

## 2019-04-04 DIAGNOSIS — R410 Disorientation, unspecified: Secondary | ICD-10-CM | POA: Diagnosis not present

## 2019-04-04 DIAGNOSIS — D649 Anemia, unspecified: Secondary | ICD-10-CM | POA: Diagnosis not present

## 2019-04-04 DIAGNOSIS — Z79890 Hormone replacement therapy (postmenopausal): Secondary | ICD-10-CM

## 2019-04-04 DIAGNOSIS — Z791 Long term (current) use of non-steroidal anti-inflammatories (NSAID): Secondary | ICD-10-CM

## 2019-04-04 LAB — COMPREHENSIVE METABOLIC PANEL
ALT: 49 U/L — ABNORMAL HIGH (ref 0–44)
AST: 38 U/L (ref 15–41)
Albumin: 3.1 g/dL — ABNORMAL LOW (ref 3.5–5.0)
Alkaline Phosphatase: 84 U/L (ref 38–126)
Anion gap: 8 (ref 5–15)
BUN: 32 mg/dL — ABNORMAL HIGH (ref 8–23)
CO2: 28 mmol/L (ref 22–32)
Calcium: 9.5 mg/dL (ref 8.9–10.3)
Chloride: 108 mmol/L (ref 98–111)
Creatinine, Ser: 0.89 mg/dL (ref 0.44–1.00)
GFR calc Af Amer: >60 mL/min (ref >60)
GFR calc non Af Amer: 57 mL/min — ABNORMAL LOW (ref >60)
Glucose, Bld: 109 mg/dL — ABNORMAL HIGH (ref 70–99)
Potassium: 3.4 mmol/L — ABNORMAL LOW (ref 3.5–5.1)
Sodium: 144 mmol/L (ref 135–145)
Total Bilirubin: 0.7 mg/dL (ref 0.3–1.2)
Total Protein: 7.5 g/dL (ref 6.5–8.1)

## 2019-04-04 LAB — URINALYSIS, ROUTINE W REFLEX MICROSCOPIC
Bilirubin Urine: NEGATIVE
Glucose, UA: NEGATIVE mg/dL
Hgb urine dipstick: NEGATIVE
Ketones, ur: NEGATIVE mg/dL
Leukocytes,Ua: NEGATIVE
Nitrite: NEGATIVE
Protein, ur: NEGATIVE mg/dL
Specific Gravity, Urine: 1.029 (ref 1.005–1.030)
pH: 5 (ref 5.0–8.0)

## 2019-04-04 LAB — CBC WITH DIFFERENTIAL/PLATELET
Abs Immature Granulocytes: 0.02 10*3/uL (ref 0.00–0.07)
Basophils Absolute: 0 10*3/uL (ref 0.0–0.1)
Basophils Relative: 0 %
Eosinophils Absolute: 0.1 10*3/uL (ref 0.0–0.5)
Eosinophils Relative: 1 %
HCT: 41.2 % (ref 36.0–46.0)
Hemoglobin: 13.2 g/dL (ref 12.0–15.0)
Immature Granulocytes: 0 %
Lymphocytes Relative: 22 %
Lymphs Abs: 1.5 10*3/uL (ref 0.7–4.0)
MCH: 31.9 pg (ref 26.0–34.0)
MCHC: 32 g/dL (ref 30.0–36.0)
MCV: 99.5 fL (ref 80.0–100.0)
Monocytes Absolute: 1 10*3/uL (ref 0.1–1.0)
Monocytes Relative: 15 %
Neutro Abs: 4.1 10*3/uL (ref 1.7–7.7)
Neutrophils Relative %: 62 %
Platelets: 261 10*3/uL (ref 150–400)
RBC: 4.14 MIL/uL (ref 3.87–5.11)
RDW: 13.4 % (ref 11.5–15.5)
WBC: 6.7 10*3/uL (ref 4.0–10.5)
nRBC: 0 % (ref 0.0–0.2)

## 2019-04-04 LAB — CBG MONITORING, ED: Glucose-Capillary: 115 mg/dL — ABNORMAL HIGH (ref 70–99)

## 2019-04-04 LAB — SARS CORONAVIRUS 2 (TAT 6-24 HRS): SARS Coronavirus 2: NEGATIVE

## 2019-04-04 LAB — PROTIME-INR
INR: 1 (ref 0.8–1.2)
Prothrombin Time: 13.4 seconds (ref 11.4–15.2)

## 2019-04-04 LAB — AMMONIA: Ammonia: <9 umol/L — ABNORMAL LOW (ref 9–35)

## 2019-04-04 MED ORDER — SODIUM CHLORIDE 0.9 % IV BOLUS
1000.0000 mL | Freq: Once | INTRAVENOUS | Status: AC
  Administered 2019-04-04: 1000 mL via INTRAVENOUS

## 2019-04-04 MED ORDER — QUETIAPINE FUMARATE 25 MG PO TABS
12.5000 mg | ORAL_TABLET | Freq: Every day | ORAL | Status: DC
  Administered 2019-04-08 – 2019-04-11 (×4): 12.5 mg via ORAL
  Filled 2019-04-04 (×8): qty 1

## 2019-04-04 MED ORDER — HYDRALAZINE HCL 20 MG/ML IJ SOLN
5.0000 mg | INTRAMUSCULAR | Status: DC | PRN
  Administered 2019-04-04 – 2019-04-05 (×3): 5 mg via INTRAVENOUS
  Filled 2019-04-04 (×3): qty 1

## 2019-04-04 MED ORDER — DONEPEZIL HCL 5 MG PO TABS
5.0000 mg | ORAL_TABLET | Freq: Every day | ORAL | Status: DC
  Administered 2019-04-08 – 2019-04-11 (×4): 5 mg via ORAL
  Filled 2019-04-04 (×4): qty 1

## 2019-04-04 MED ORDER — SERTRALINE HCL 25 MG PO TABS
25.0000 mg | ORAL_TABLET | Freq: Every day | ORAL | Status: DC
  Administered 2019-04-08 – 2019-04-12 (×5): 25 mg via ORAL
  Filled 2019-04-04 (×5): qty 1

## 2019-04-04 MED ORDER — LEVETIRACETAM IN NACL 500 MG/100ML IV SOLN
500.0000 mg | Freq: Two times a day (BID) | INTRAVENOUS | Status: DC
  Administered 2019-04-04 – 2019-04-12 (×16): 500 mg via INTRAVENOUS
  Filled 2019-04-04 (×19): qty 100

## 2019-04-04 NOTE — ED Provider Notes (Signed)
[facility] DEPT Provider Note   CSN: [id number] Arrival date & time: 04/04/19  1610     History Chief Complaint  Patient presents with  . Altered Mental Status    Jocelyn Mccann is a 86 y.o. female.  Level 5 caveat secondary to dementia and altered mental status.  She is brought in by EMS from [place] after being more altered since yesterday.  Baseline is she is sometimes verbal.  Since yesterday she is nonverbal and has not been eating or drinking or taking her medications.  No other history available at this time.  The history is provided by the EMS personnel.  Altered Mental Status Presenting symptoms: lethargy and partial responsiveness   Severity:  Severe Most recent episode:  Yesterday Episode history:  Continuous Duration:  2 days Timing:  Constant Progression:  Unchanged Chronicity:  New Context: dementia and nursing home resident        Past Medical History:  Diagnosis Date  . Acute cerebrovascular accident (CVA) of cerebellum ([place]) 02/17/2019  . Bilateral knee pain   . Breast cancer ([place])    diagnosed 11/2017  . Dementia ([place])   . Osteoporosis   . Seizures ([place]) 02/06/2019  . Urge incontinence of urine     Patient Active Problem List   Diagnosis Date Noted  . Klebsiella cystitis 02/17/2019  . Cerebrovascular accident (CVA) of right thalamus ([place]) 02/17/2019  . Acute cerebrovascular accident (CVA) of cerebellum ([place]) 02/17/2019  . HCAP (healthcare-associated pneumonia) 02/09/2019  . Sepsis ([place]) 02/09/2019  . Pressure injury of skin 02/07/2019  . Hypoglycemia without diagnosis of diabetes mellitus 02/06/2019  . Seizures ([place]) 02/06/2019  . Pancytopenia ([place]) 02/06/2019  . Palliative care by specialist   . Goals of care, counseling/discussion   . Acute encephalopathy   . Dysphagia   . SIRS (systemic inflammatory response syndrome) ([place]) 02/01/2019  . Syncope 12/05/2018  . SDH (subdural hematoma) ([place]) 08/21/2018  .  Malignant neoplasm of upper-outer quadrant of right breast in female, estrogen receptor positive ([place]) 12/20/2017  . Breast cancer ([place])   . Osteoarthritis of knee 06/01/2017  . Bilateral knee pain 03/31/2017  . Urge incontinence of urine 03/31/2017  . Generalized anxiety disorder 03/31/2017  . Osteoporosis without current pathological fracture 03/31/2017  . Alzheimer disease ([place]) 03/31/2017    Past Surgical History:  Procedure Laterality Date  . BREAST LUMPECTOMY Right 2001  . BREAST LUMPECTOMY Right 12/2017  . BREAST LUMPECTOMY WITH SENTINEL LYMPH NODE BIOPSY Right 2001  . BREAST LUMPECTOMY WITH SENTINEL LYMPH NODE BIOPSY Right 12/30/2017   Procedure: RIGHT BREAST LUMPECTOMY WITH SENTINEL LYMPH NODE BIOPSY ERAS PATHWAY;  Surgeon: Stark Klein, MD;  Location: [facility];  Service: General;  Laterality: Right;  . DENTAL SURGERY       OB History   No obstetric history on file.     Family History  Problem Relation Age of Onset  . Cancer Mother        breast    Social History   Tobacco Use  . Smoking status: Never Smoker  . Smokeless tobacco: Never Used  Substance Use Topics  . Alcohol use: Never  . Drug use: Never    Home Medications Prior to Admission medications   Medication Sig Start Date End Date Taking? Authorizing Provider  acetaminophen (TYLENOL) 325 MG tablet Take 2 tablets (650 mg total) by mouth every 6 (six) hours as needed for mild pain (or Fever >/= 101). 02/13/19   Debbe Odea, MD  acetaminophen (TYLENOL) 500 MG tablet Take 500 mg by mouth. PRIOR TO THERAPY    [provider]  anastrozole (ARIMIDEX) 1 MG tablet Take 1 tablet (1 mg total) by mouth daily. 11/27/18   Magrinat, Virgie Dad, MD  aspirin EC 81 MG tablet Take 81 mg by mouth daily.    [provider]  bisacodyl (DULCOLAX) 10 MG suppository If not relieved by MOM, give 10 mg Bisacodyl suppositiory rectally X 1 dose in 24 hours as needed (Do not use constipation standing orders for  residents with renal failure/CFR less than 30. Contact MD for orders) (Physician Order)    [provider]  cholecalciferol (VITAMIN D3) 25 MCG (1000 UT) tablet Take 1 tablet (1,000 Units total) by mouth daily. 02/02/18   Magrinat, Virgie Dad, MD  diclofenac Sodium (VOLTAREN) 1 % GEL 2 g. Apply 2 grams to both knees once daily for pain    [provider]  donepezil (ARICEPT) 5 MG tablet TAKE 1 TABLET (5 MG TOTAL) BY MOUTH AT BEDTIME. FOR ALZHEIMER'S DISEASE 01/29/19   Sater, Nanine Means, MD  levETIRAcetam (KEPPRA) 500 MG tablet Take 1 tablet (500 mg total) by mouth 2 (two) times daily. 02/13/19   Debbe Odea, MD  magnesium hydroxide (MILK OF MAGNESIA) 400 MG/5ML suspension If no BM in 3 days, give 30 cc Milk of Magnesium p.o. x 1 dose in 24 hours as needed (Do not use standing constipation orders for residents with renal failure CFR less than 30. Contact MD for orders) (Physician Order)    [provider]  MELATONIN PO Take 1 tablet by mouth at bedtime as needed (for sleep).    [provider]  Multiple Vitamin (MULTIVITAMIN WITH MINERALS) TABS tablet Take 1 tablet by mouth daily. 02/14/19   Debbe Odea, MD  omeprazole (PRILOSEC) 20 MG capsule Take 20 mg by mouth daily.    [provider]  QUEtiapine (SEROQUEL) 25 MG tablet Take 12.5 mg by mouth at bedtime.    [provider]  sertraline (ZOLOFT) 25 MG tablet Take 1 tablet (25 mg total) by mouth daily. 12/05/18   Sater, Nanine Means, MD  Sodium Phosphates (RA SALINE ENEMA RE) If not relieved by Biscodyl suppository, give disposable Saline Enema rectally X 1 dose/24 hrs as needed (Do not use constipation standing orders for residents with renal failure/CFR less than 30. Contact MD for orders)(Physician Or    [provider]    Allergies    Milk-related compounds and Pork-derived products  Review of Systems   Review of Systems  Unable to perform ROS: Patient unresponsive    Physical  Exam Updated Vital Signs BP 140/67   Pulse 88   Temp 98.1 F (36.7 C) (Oral)   Resp 18   Ht 5\' 7"  (1.702 m)   Wt 62.6 kg   SpO2 99%   BMI 21.60 kg/m   Physical Exam Vitals and nursing note reviewed.  Constitutional:      General: She is not in acute distress.    Appearance: She is underweight.  HENT:     Head: Normocephalic and atraumatic.  Eyes:     Conjunctiva/sclera: Conjunctivae normal.  Cardiovascular:     Rate and Rhythm: Normal rate and regular rhythm.     Heart sounds: No murmur.  Pulmonary:     Effort: Pulmonary effort is normal. No respiratory distress.     Breath sounds: Normal breath sounds.  Abdominal:     Palpations: Abdomen is soft.  Tenderness: There is no abdominal tenderness. There is no guarding or rebound.  Musculoskeletal:        General: No deformity or signs of injury.     Cervical back: Neck supple.     Right lower leg: No edema.     Left lower leg: No edema.  Skin:    General: Skin is warm and dry.     Capillary Refill: Capillary refill takes less than 2 seconds.  Neurological:     Mental Status: She is lethargic.     Comments: Patient has eyes closed not answering questions she will resist when I try to range of motion her extremities.  No focal findings.     ED Results / Procedures / Treatments   Labs (all labs ordered are listed, but only abnormal results are displayed) Labs Reviewed  COMPREHENSIVE METABOLIC PANEL - Abnormal; Notable for the following components:      Result Value   Potassium 3.4 (*)    Glucose, Bld 109 (*)    BUN 32 (*)    Albumin 3.1 (*)    ALT 49 (*)    GFR calc non Af Amer 57 (*)    All other components within normal limits  URINALYSIS, ROUTINE W REFLEX MICROSCOPIC - Abnormal; Notable for the following components:   Color, Urine AMBER (*)    APPearance HAZY (*)    All other components within normal limits  AMMONIA - Abnormal; Notable for the following components:   Ammonia <9 (*)    All other  components within normal limits  CBG MONITORING, ED - Abnormal; Notable for the following components:   Glucose-Capillary 115 (*)    All other components within normal limits  SARS CORONAVIRUS 2 (TAT 6-24 HRS)  URINE CULTURE  RESPIRATORY PANEL BY RT PCR (FLU A&B, COVID)  CBC WITH DIFFERENTIAL/PLATELET  PROTIME-INR  TYPE AND SCREEN    EKG EKG Interpretation  Date/Time:  Wednesday April 04 2019 17:00:29 EDT Ventricular Rate:  82 PR Interval:    QRS Duration: 83 QT Interval:  410 QTC Calculation: 479 R Axis:   64 Text Interpretation: Sinus arrhythmia No significant change since 1/21 Confirmed by Aletta Edouard [phone]) on 04/04/2019 5:03:23 PM   Radiology CT Head Wo Contrast  Result Date: 04/04/2019 CLINICAL DATA:  Encephalopathy. EXAM: CT HEAD WITHOUT CONTRAST TECHNIQUE: Contiguous axial images were obtained from the base of the skull through the vertex without intravenous contrast. COMPARISON:  Head CT and brain MRI, 02/09/2019. FINDINGS: Brain: Acute hemorrhage fills the right lateral ventricle, as well as extending into the third ventricle and layering in the atrium the left lateral ventricle. The lateral ventricles enlarged, right greater than left, larger than on the prior CT. There is mild midline shift to the left of 4 mm, likely accentuated by the patient's positioning, head turned toward the left. There is no fencing parenchymal hemorrhage, although the images suspected to have arisen from the superior right thalamus. No evidence of subdural or subarachnoid hemorrhage. There are no parenchymal masses. No evidence of an acute ischemic infarct. There are no extra-axial masses. Patchy areas of white matter hypoattenuation are noted bilaterally consistent with mild chronic microvascular ischemic change. Vascular: No hyperdense vessel or unexpected calcification. Skull: Normal. Negative for fracture or focal lesion. Sinuses/Orbits: No acute finding. Other: None. IMPRESSION: 1. Acute  intracranial hemorrhage with hemorrhage filling and distending the right lateral ventricle, as well as hemorrhage extending into the third ventricle and layering dependently in the atrium of the left lateral  ventricle. There is lateral ventricular enlargement increased when compared to the prior CT suggesting mild hydrocephalus. The origin of the acute intraventricular hemorrhage is likely from the superior right thalamus, where areas of infarction were noted on the brain MRI from 02/03/2019. There is no visualized parenchymal hemorrhage and no evidence of subarachnoid or subdural hemorrhage. No ischemic infarction. Critical Value/emergent results were called by telephone at the time of interpretation on 04/04/2019 at 5:36 pm to provider [facility] , who verbally acknowledged these results. Electronically Signed   By: Lajean Manes M.D.   On: 04/04/2019 17:37   DG Chest Port 1 View  Result Date: 04/04/2019 CLINICAL DATA:  86 year old female with altered mental status. EXAM: PORTABLE CHEST 1 VIEW COMPARISON:  Chest radiograph dated 02/09/2019. FINDINGS: There is no focal consolidation, pleural effusion, pneumothorax. There is mild cardiomegaly. Atherosclerotic calcification of the aorta. Osteopenia with degenerative changes of the spine and shoulders. There is elevation of the humeral head, likely related to chronic rotator cuff injury. Surgical clips noted over the right chest wall. No acute osseous pathology. IMPRESSION: No acute cardiopulmonary process. Electronically Signed   By: Anner Crete M.D.   On: 04/04/2019 17:46    Procedures .Critical Care Performed by: Hayden Rasmussen, MD Authorized by: Hayden Rasmussen, MD   Critical care provider statement:    Critical care time (minutes):  45   Critical care time was exclusive of:  Separately billable procedures and treating other patients   Critical care was necessary to treat or prevent imminent or life-threatening deterioration of the  following conditions:  CNS failure or compromise   Critical care was time spent personally by me on the following activities:  Discussions with consultants, evaluation of patient's response to treatment, examination of patient, ordering and performing treatments and interventions, ordering and review of laboratory studies, ordering and review of radiographic studies, pulse oximetry, re-evaluation of patient's condition, obtaining history from patient or surrogate, review of old charts and development of treatment plan with patient or surrogate   I assumed direction of critical care for this patient from another provider in my specialty: no     (including critical care time)  Medications Ordered in ED Medications  sodium chloride 0.9 % bolus 1,000 mL (1,000 mLs Intravenous New Bag/Given 04/04/19 1649)    ED Course  I have reviewed the triage vital signs and the nursing notes.  Pertinent labs & imaging results that were available during my care of the patient were reviewed by me and considered in my medical decision making (see chart for details).  Clinical Course as of Apr 05 946  Wed Apr 04, 2019  1637 Differential includes sepsis, stroke, metabolic derangement, infection,   [MB]  1731 CT interpreted by me as large intraventricular bleed.  Awaiting radiology reading.   [MB]  1736 Received a call from radiology.  He is seen the ventricular bleed, thinks it may be from an old thalamic infarct that might of started leaking.   [MB]  1739 Chest x-ray interpreted by me as no obvious pneumothorax or infiltrates.  Very rotated.  Awaiting radiology reading   [MB]  [phone] Discussed with neurosurgery who will evaluate the patient and come back with recommendations.   [MB]  [id number] Neurosurgery evaluated the patient and their recommendation is for no surgical intervention.  They discussed this with the daughter and I had the same discussion.  The daughter still wants the patient to be full code.  Will  page the hospitalist for admission.  Neurosurgery attending is Dr. Ronnald Ramp.   [MB]  1908 Discussed with Dr. Flossie Buffy from the hospitalist service who will evaluate the patient for admission.   [MB]    Clinical Course User Index [MB] Hayden Rasmussen, MD   MDM Rules/Calculators/A&P                       Final Clinical Impression(s) / ED Diagnoses Final diagnoses:  Nontraumatic intraventricular intracerebral hemorrhage, unspecified laterality ([place])  Altered mental status, unspecified altered mental status type    Rx / DC Orders ED Discharge Orders    None       Hayden Rasmussen, MD 04/05/19 [phone]

## 2019-04-04 NOTE — Progress Notes (Signed)
Location:    [facility] Room Number: 417/D Place of Service:  SNF [phone]) Provider:  Henreitta Leber, MD  Patient Care Team: [name], MD as PCP - General (Internal Medicine) Magrinat, Virgie Dad, MD as Consulting Physician (Oncology) Magrinat, Virgie Dad, MD as Consulting Physician (Oncology) Susa Day, MD as Consulting Physician (Orthopedic Surgery) Stark Klein, MD as Consulting Physician (General Surgery) Gery Pray, MD as Consulting Physician (Radiation Oncology)  Extended Emergency Contact Information Primary Emergency Contact: Ibrahim,[name] Address: [address]          [place], [place] [address] [place] Phone: [phone] Mobile Phone: [phone] Relation: Daughter Secondary Emergency Contact: kennidi, reynold Mobile Phone: [phone] Relation: Son  Code Status:  Full Code Goals of care: Advanced Directive information Advanced Directives 04/04/2019  Does Patient Have a Medical Advance Directive? Yes  Type of Advance Directive (No Data)  Does patient want to make changes to medical advance directive? No - Patient declined  Copy of [place] in Chart? -  Would patient like information on creating a medical advance directive? -  Pre-existing out of facility DNR order (yellow form or pink MOST form) Pink MOST form placed in chart (order not valid for inpatient use)     Chief complaint acute visit secondary to altered mental status increased lethargy  HPI:  Pt is a 86 y.o. female seen today for an acute visit for change in mental status with increased lethargy.  Patient has had poor p.o. intake apparently the last couple of days.    A urinalysis and culture is pending she does have a history of UTIs as well as pneumonia and history of CVA.  However today she has had increased lethargy decreased responsiveness-per staff she is now pocketing her food which is  new.  When I entered the room  with stimuli she did respond -- did open her eyes did move her limbs some.  However she is certainly less responsive than what I have seen previously.  She is not really talking she does mumble something but this is hard to understand.  Vital signs show mild tachycardia.  Appears at times her blood pressure is  elevated however it is 131/81 this afternoon.  CBG is 176.  Patient had a recent complicated hospital stay she had worsening confusion and increased weakness and had a white count of 1.1.  She was admitted for neutropenia and hypothermia.  In the hospital she appeared to have a seizure her CBG was noted to be 23 a systolic blood pressure of 60.  She required intubation MRI showed a punctate acute early subacute infarct in the right thalamus right external capsule and right cerebellum.  She was also treated for sepsis with UTI.  She also subsequently was found to have healthcare associated pneumonia and was started on vancomycin and cefepime I am.  She improved clinically and was discharged to skilled nursing for rehab.  Her diagnoses include history of CVA as noted above history of seizure disorder-dementia-history of breast cancer-history of knee pain and swelling-depression   Past Medical History:  Diagnosis Date  . Acute cerebrovascular accident (CVA) of cerebellum ([facility]) 02/17/2019  . Bilateral knee pain   . Breast cancer ([facility])    diagnosed 11/2017  . Dementia ([facility])   . Osteoporosis   . Seizures ([facility]) 02/06/2019  . Urge incontinence of urine    Past Surgical History:  Procedure Laterality Date  .  BREAST LUMPECTOMY Right 2001  . BREAST LUMPECTOMY Right 12/2017  . BREAST LUMPECTOMY WITH SENTINEL LYMPH NODE BIOPSY Right 2001  . BREAST LUMPECTOMY WITH SENTINEL LYMPH NODE BIOPSY Right 12/30/2017   Procedure: RIGHT BREAST LUMPECTOMY WITH SENTINEL LYMPH NODE BIOPSY ERAS PATHWAY;  Surgeon: Stark Klein, MD;  Location: [facility];  Service:  General;  Laterality: Right;  . DENTAL SURGERY      Allergies  Allergen Reactions  . Milk-Related Compounds Diarrhea  . Pork-Derived Products Other (See Comments)    PATIENT DOES NOT EAT PORK    Outpatient Encounter Medications as of 04/04/2019  Medication Sig  . acetaminophen (TYLENOL) 325 MG tablet Take 2 tablets (650 mg total) by mouth every 6 (six) hours as needed for mild pain (or Fever >/= 101).  Marland Kitchen acetaminophen (TYLENOL) 500 MG tablet Take 500 mg by mouth. PRIOR TO THERAPY  . anastrozole (ARIMIDEX) 1 MG tablet Take 1 tablet (1 mg total) by mouth daily.  Marland Kitchen aspirin EC 81 MG tablet Take 81 mg by mouth daily.  . bisacodyl (DULCOLAX) 10 MG suppository If not relieved by MOM, give 10 mg Bisacodyl suppositiory rectally X 1 dose in 24 hours as needed (Do not use constipation standing orders for residents with renal failure/CFR less than 30. Contact MD for orders) (Physician Order)  . cholecalciferol (VITAMIN D3) 25 MCG (1000 UT) tablet Take 1 tablet (1,000 Units total) by mouth daily.  . diclofenac Sodium (VOLTAREN) 1 % GEL 2 g. Apply 2 grams to both knees once daily for pain  . donepezil (ARICEPT) 5 MG tablet TAKE 1 TABLET (5 MG TOTAL) BY MOUTH AT BEDTIME. FOR ALZHEIMER'S DISEASE  . levETIRAcetam (KEPPRA) 500 MG tablet Take 1 tablet (500 mg total) by mouth 2 (two) times daily.  . magnesium hydroxide (MILK OF MAGNESIA) 400 MG/5ML suspension If no BM in 3 days, give 30 cc Milk of Magnesium p.o. x 1 dose in 24 hours as needed (Do not use standing constipation orders for residents with renal failure CFR less than 30. Contact MD for orders) (Physician Order)  . MELATONIN PO Take 1 tablet by mouth at bedtime as needed (for sleep).  . Multiple Vitamin (MULTIVITAMIN WITH MINERALS) TABS tablet Take 1 tablet by mouth daily.  Marland Kitchen omeprazole (PRILOSEC) 20 MG capsule Take 20 mg by mouth daily.  . QUEtiapine (SEROQUEL) 25 MG tablet Take 12.5 mg by mouth at bedtime.  . sertraline (ZOLOFT) 25 MG tablet Take  1 tablet (25 mg total) by mouth daily.  . Sodium Phosphates (RA SALINE ENEMA RE) If not relieved by Biscodyl suppository, give disposable Saline Enema rectally X 1 dose/24 hrs as needed (Do not use constipation standing orders for residents with renal failure/CFR less than 30. Contact MD for orders)(Physician Or  . [DISCONTINUED] feeding supplement, ENSURE ENLIVE, (ENSURE ENLIVE) LIQD Take 237 mLs by mouth 3 (three) times daily between meals.  . [DISCONTINUED] QUEtiapine (SEROQUEL) 25 MG tablet TAKE 1 TABLET BY MOUTH EVERYDAY AT BEDTIME  . [DISCONTINUED] traMADol (ULTRAM) 50 MG tablet Take 1 tablet (50 mg total) by mouth every 6 (six) hours as needed for moderate pain.   No facility-administered encounter medications on file as of 04/04/2019.    Review of Systems   Is unobtainable since secondary to patient's decreased responsiveness please see HPI  Immunization History  Administered Date(s) Administered  . Fluad Quad(high Dose 65+) 12/05/2018   Pertinent  Health Maintenance Due  Topic Date Due  . PNA vac Low Risk Adult (1 of 2 -  PCV13) Never done  . INFLUENZA VACCINE  Completed  . DEXA SCAN  Completed   Fall Risk  09/21/2018 03/31/2017  Falls in the past year? 1 Yes  Number falls in past yr: 1 1  Injury with Fall? 0 No  Risk for fall due to : History of fall(s);Medication side effect -  Follow up Falls evaluation completed;Falls prevention discussed;Education provided -   Functional Status Survey:   Temperature is 97.8 pulse 101 respirations 22 blood pressure 131/81 oxygen saturation is in the 90s on room air CBG is 176  Body mass index is 25.22 kg/m. Physical Exam In general this is a somewhat frail elderly female she is not in any distress but does appear significantly lethargic.  She does respond to touch stimuli moves her arms does open her eyes some.  Attempts to talk a minimal amount but is incoherent.  Her skin is warm and dry.  Eyes pupils appear to be reactive.to  light  Oropharynx was difficult to assess from what I could see it was clear but she did not really open her mouth much at all.  Chest poor respiratory effort she does not follow verbal commands but cannot appreciate an overt congestion or labored breathing.  Heart is regular rate and rhythm slightly tachycardic she has some mild lower extremity edema.  Abdomen is soft does not appear to be tender there are positive bowel sounds.  Musculoskeletal she does have resistance to gravity when I raise her arms with some movement of her arms.  Has lower extremity weakness and stiffness of her lower extremities.  Neurologic as noted above she has significantly decreased responsiveness but she does respond to stimuli-does speak a small amount but this is more mumbling-this is a significant change from her baseline.  Psych as noted above   Labs reviewed:  March 15, 2019.  WBC 3.6 hemoglobin 10.6 platelets 200,000.  Sodium 142 potassium 4.2 BUN 29.7 creatinine 0.68  Recent Labs    02/01/19 2346 02/02/19 0302 02/03/19 0312 02/04/19 0230 02/05/19 0217 02/06/19 0559 02/11/19 0318 02/12/19 0435 02/13/19 0256  NA   < >  --  142   < > 137   < > 139 139 138  K   < >  --  4.3   < > 4.0   < > 4.6 4.2 4.4  CL   < >  --  112*   < > 108   < > 109 106 106  CO2   < >  --  24   < > 22   < > 21* 24 24  GLUCOSE   < >  --  119*   < > 110*   < > 106* 107* 110*  BUN   < >  --  29*   < > 33*   < > 26* 15 15  CREATININE   < >  --  1.05*   < > 0.83   < > 0.80 0.68 0.71  CALCIUM   < >  --  8.6*   < > 8.6*   < > 9.1 9.4 9.0  MG  --  1.6* 2.1  --  1.9  --   --   --   --   PHOS  --  2.6  --   --   --   --   --   --   --    < > = values in this interval not displayed.   Recent Labs  02/04/19 0230 02/05/19 0217 02/06/19 0559  AST 111* 87* 191*  ALT 129* 111* 184*  ALKPHOS 106 96 121  BILITOT 0.9 0.4 0.8  PROT 4.9* 4.8* 5.7*  ALBUMIN 2.1* 2.0* 2.3*   Recent Labs    02/05/19 1255 02/05/19 1255  02/07/19 0227 02/07/19 0227 02/08/19 0814 02/09/19 0004 02/11/19 0318 02/12/19 0435 02/13/19 0256  WBC 3.9*   < > 3.7*   < > 4.2   < > 4.5 4.3 5.1  NEUTROABS 2.5  --  2.3  --  2.7  --   --   --   --   HGB 9.0*   < > 8.4*   < > 8.6*   < > 9.6* 8.9* 8.3*  HCT 27.9*   < > 25.4*   < > 26.1*   < > 28.8* 26.7* 25.4*  MCV 102.2*   < > 100.4*   < > 100.8*   < > 99.0 98.9 99.6  PLT 59*   < > 81*   < > 72*   < > 243 233 242   < > = values in this interval not displayed.   Lab Results  Component Value Date   TSH 2.742 02/09/2019   Lab Results  Component Value Date   HGBA1C 5.6 02/02/2019   Lab Results  Component Value Date   TRIG 24 02/05/2019    Significant Diagnostic Results in last 30 days:  No results found.  Assessment/Plan #1 decreased responsiveness lethargy with significant change in mental status -will send her to the ER for expedient evaluation-she does have significant change in status here.  Once would suspect possibly an infection-but cannot rule out a neurologic event or other factor contributing to her change in status  [phone]

## 2019-04-04 NOTE — Progress Notes (Signed)
Called in regards to this patients head CT. According to the daughter she has had multiple falls over the last several months. She has been in a rehab facility since her last fall. It was reported that she fell out of her wheelchair sometime this month. CT head shows intraventricular hemorrhage into both lateral ventricles and extends into the 3rd vent with some mild hydrocephalus compared to her ct in January. I spoke with the daughter and had a lengthy discussion about aggressive care versus comfort care. I do think that the risks far outweigh the benefits of placing an EVD in this 86 year old patient with dementia and frequent falls. The daughter agrees. At this point, I would recommend changing her code status to DNR and hospitalists admit for comfort measures.

## 2019-04-04 NOTE — H&P (Signed)
History and Physical    Jocelyn Mccann [id number] DOB: 05-Dec-1928 DOA: 04/04/2019  PCP: [name], MD  Patient coming from: [facility]  I have personally briefly reviewed patient's old medical records in [place]  Chief Complaint: decrease responsiveness  HPI: Jocelyn Mccann is a 86 y.o. female with medical history significant for Alzheimer's disease, CVA, dysphagia, history of subdural hematoma with seizures, history of right breast cancer s/p lumpectomy on treatment with anastrozole who presents with concerns of decreased responsiveness.  History is obtained in its entirety from ED physician and daughter at bedside as patient is unable to provide any history at this time.  Daughter reports that she was told by the facility that patient has had altered mental status and decreased responsiveness for the past 2 days.  She also had decreased p.o. intake.  Daughter saw her yesterday and was just told that she was sleeping in bed.    She reports that patient has been progressively declining over the last year and is unsure of her current baseline but normally she is able to answer questions. Back in January 2021 patient had an unwitnessed fall and was found to be obtunded and has seizures.  She was intubated during that admission.  Hospital course was also complicated by septic shock due to UTI and she subsequently also developed HCAP.  She was discharged to SNF from home during that admission. She has since had several falls in February and March. Twice she was found at the side of her bed and sometime in March she fell out of wheelchair and hit her head.  On on arrival, patient is only responsive to painful stimuli.  She was hypertensive but otherwise stable on room air.  CT head showed intraventricular hemorrhage into both lateral ventricles and extends to the third ventricle with mild hydrocephalus.  Neurosurgery was consulted by ED physician Dr.  Melina Copa and evaluated patient at bedside and has deemed that the risk far outweighs the benefit of placing an EVD in this elderly patient with dementia and frequent falls.  Hospitalist was consulted for admission and to discuss ongoing CODE STATUS.  Review of Systems: Unable to obtain due to patient's obtundation  Past Medical History:  Diagnosis Date  . Acute cerebrovascular accident (CVA) of cerebellum ([place]) 02/17/2019  . Bilateral knee pain   . Breast cancer ([place])    diagnosed 11/2017  . Dementia ([place])   . Osteoporosis   . Seizures ([place]) 02/06/2019  . Urge incontinence of urine     Past Surgical History:  Procedure Laterality Date  . BREAST LUMPECTOMY Right 2001  . BREAST LUMPECTOMY Right 12/2017  . BREAST LUMPECTOMY WITH SENTINEL LYMPH NODE BIOPSY Right 2001  . BREAST LUMPECTOMY WITH SENTINEL LYMPH NODE BIOPSY Right 12/30/2017   Procedure: RIGHT BREAST LUMPECTOMY WITH SENTINEL LYMPH NODE BIOPSY ERAS PATHWAY;  Surgeon: Stark Klein, MD;  Location: [facility];  Service: General;  Laterality: Right;  . DENTAL SURGERY       reports that she has never smoked. She has never used smokeless tobacco. She reports that she does not drink alcohol or use drugs.  Allergies  Allergen Reactions  . Milk-Related Compounds Diarrhea  . Pork-Derived Products Other (See Comments)    PATIENT DOES NOT EAT PORK    Family History  Problem Relation Age of Onset  . Cancer Mother        breast     Prior to Admission medications   Medication  Sig Start Date End Date Taking? Authorizing Provider  acetaminophen (TYLENOL) 325 MG tablet Take 2 tablets (650 mg total) by mouth every 6 (six) hours as needed for mild pain (or Fever >/= 101). 02/13/19  Yes Debbe Odea, MD  acetaminophen (TYLENOL) 500 MG tablet Take 500 mg by mouth. PRIOR TO THERAPY   Yes [provider]  anastrozole (ARIMIDEX) 1 MG tablet Take 1 tablet (1 mg total) by mouth daily. 11/27/18  Yes Magrinat, Virgie Dad, MD  aspirin EC 81 MG  tablet Take 81 mg by mouth daily.   Yes [provider]  bisacodyl (DULCOLAX) 10 MG suppository Place 10 mg rectally daily as needed for moderate constipation. If not relieved by MOM, give 10 mg Bisacodyl suppositiory rectally X 1 dose in 24 hours as needed (Do not use constipation standing orders for residents with renal failure/CFR less than 30. Contact MD for orders) (Physician Order)    Yes [provider]  cholecalciferol (VITAMIN D3) 25 MCG (1000 UT) tablet Take 1 tablet (1,000 Units total) by mouth daily. 02/02/18  Yes Magrinat, Virgie Dad, MD  diclofenac Sodium (VOLTAREN) 1 % GEL 2 g. Apply 2 grams to both knees once daily for pain   Yes [provider]  donepezil (ARICEPT) 5 MG tablet TAKE 1 TABLET (5 MG TOTAL) BY MOUTH AT BEDTIME. FOR ALZHEIMER'S DISEASE 01/29/19  Yes Sater, Nanine Means, MD  feeding supplement (BOOST HIGH PROTEIN) LIQD Take 1 Container by mouth 2 (two) times daily between meals.   Yes [provider]  levETIRAcetam (KEPPRA) 500 MG tablet Take 1 tablet (500 mg total) by mouth 2 (two) times daily. 02/13/19  Yes Debbe Odea, MD  magnesium hydroxide (MILK OF MAGNESIA) 400 MG/5ML suspension If no BM in 3 days, give 30 cc Milk of Magnesium p.o. x 1 dose in 24 hours as needed (Do not use standing constipation orders for residents with renal failure CFR less than 30. Contact MD for orders) (Physician Order)   Yes [provider]  MELATONIN PO Take 1 tablet by mouth at bedtime as needed (for sleep).   Yes [provider]  Multiple Vitamin (MULTIVITAMIN WITH MINERALS) TABS tablet Take 1 tablet by mouth daily. 02/14/19  Yes Debbe Odea, MD  omeprazole (PRILOSEC) 20 MG capsule Take 20 mg by mouth daily.   Yes [provider]  QUEtiapine (SEROQUEL) 25 MG tablet Take 12.5 mg by mouth at bedtime.   Yes [provider]  sertraline (ZOLOFT) 25 MG tablet Take 1 tablet (25 mg total) by mouth daily. 12/05/18  Yes Sater, Nanine Means,  MD  Sodium Phosphates (RA SALINE ENEMA RE) If not relieved by Biscodyl suppository, give disposable Saline Enema rectally X 1 dose/24 hrs as needed (Do not use constipation standing orders for residents with renal failure/CFR less than 30. Contact MD for orders)(Physician Or   Yes [provider]  traMADol (ULTRAM) 50 MG tablet Take 50 mg by mouth every 6 (six) hours as needed for moderate pain or severe pain.   Yes [provider]    Physical Exam: Vitals:   04/04/19 1620 04/04/19 1630 04/04/19 1830 04/04/19 1845  BP:  (!) 137/94  (!) 178/81  Pulse:  85 (!) 59 65  Resp:  (!) 23 16 17   Temp:  98.4 F (36.9 C)    TempSrc:  Rectal    SpO2:  97% 98% 98%  Weight: 62.6 kg     Height: 5\' 7"  (1.702 m)  Constitutional: elderly female laying in bed with eyes closed and would occasionally moan and move upper extremities. Vitals:   04/04/19 1620 04/04/19 1630 04/04/19 1830 04/04/19 1845  BP:  (!) 137/94  (!) 178/81  Pulse:  85 (!) 59 65  Resp:  (!) 23 16 17   Temp:  98.4 F (36.9 C)    TempSrc:  Rectal    SpO2:  97% 98% 98%  Weight: 62.6 kg     Height: 5\' 7"  (1.702 m)      Eyes: PERRL but sluggish, lids and conjunctivae normal ENMT: Mucous membranes are moist.Missing dentition.  Neck: normal, supple Respiratory: clear to auscultation bilaterally, no wheezing, no crackles. Normal respiratory effort on room air. No accessory muscle use.  Cardiovascular: Regular rate and rhythm, no murmurs / rubs / gallops. No extremity edema. 2+ pedal pulses. Abdomen: no tenderness, no masses palpated.  Bowel sounds positive.  Musculoskeletal: no clubbing / cyanosis. No joint deformity upper and lower extremities. no contractures. Normal muscle tone.  Skin: no rashes, lesions, ulcers. No induration Neurologic: Pt is obtunded. She mumbled when asked questions. Slight muscular contractions when asked to do hand grip and raise legs.  Psychiatric: Unable to access due to altered mental  status     Labs on Admission: I have personally reviewed following labs and imaging studies  CBC: Recent Labs  Lab 04/04/19 1646  WBC 6.7  NEUTROABS 4.1  HGB 13.2  HCT 41.2  MCV 99.5  PLT 0000000   Basic Metabolic Panel: Recent Labs  Lab 04/04/19 1646  NA 144  K 3.4*  CL 108  CO2 28  GLUCOSE 109*  BUN 32*  CREATININE 0.89  CALCIUM 9.5   GFR: Estimated Creatinine Clearance: 41.7 mL/min (by C-G formula based on SCr of 0.89 mg/dL). Liver Function Tests: Recent Labs  Lab 04/04/19 1646  AST 38  ALT 49*  ALKPHOS 84  BILITOT 0.7  PROT 7.5  ALBUMIN 3.1*   No results for input(s): LIPASE, AMYLASE in the last 168 hours. Recent Labs  Lab 04/04/19 1646  AMMONIA <9*   Coagulation Profile: Recent Labs  Lab 04/04/19 1646  INR 1.0   Cardiac Enzymes: No results for input(s): CKTOTAL, CKMB, CKMBINDEX, TROPONINI in the last 168 hours. BNP (last 3 results) No results for input(s): PROBNP in the last 8760 hours. HbA1C: No results for input(s): HGBA1C in the last 72 hours. CBG: Recent Labs  Lab 04/04/19 1647  GLUCAP 115*   Lipid Profile: No results for input(s): CHOL, HDL, LDLCALC, TRIG, CHOLHDL, LDLDIRECT in the last 72 hours. Thyroid Function Tests: No results for input(s): TSH, T4TOTAL, FREET4, T3FREE, THYROIDAB in the last 72 hours. Anemia Panel: No results for input(s): VITAMINB12, FOLATE, FERRITIN, TIBC, IRON, RETICCTPCT in the last 72 hours. Urine analysis:    Component Value Date/Time   COLORURINE AMBER (A) 04/04/2019 1646   APPEARANCEUR HAZY (A) 04/04/2019 1646   LABSPEC 1.029 04/04/2019 1646   PHURINE 5.0 04/04/2019 1646   GLUCOSEU NEGATIVE 04/04/2019 1646   HGBUR NEGATIVE 04/04/2019 1646   BILIRUBINUR NEGATIVE 04/04/2019 1646   KETONESUR NEGATIVE 04/04/2019 1646   PROTEINUR NEGATIVE 04/04/2019 1646   NITRITE NEGATIVE 04/04/2019 1646   LEUKOCYTESUR NEGATIVE 04/04/2019 1646    Radiological Exams on Admission: CT Head Wo Contrast  Result Date:  04/04/2019 CLINICAL DATA:  Encephalopathy. EXAM: CT HEAD WITHOUT CONTRAST TECHNIQUE: Contiguous axial images were obtained from the base of the skull through the vertex without intravenous contrast. COMPARISON:  Head CT and brain MRI, 02/09/2019.  FINDINGS: Brain: Acute hemorrhage fills the right lateral ventricle, as well as extending into the third ventricle and layering in the atrium the left lateral ventricle. The lateral ventricles enlarged, right greater than left, larger than on the prior CT. There is mild midline shift to the left of 4 mm, likely accentuated by the patient's positioning, head turned toward the left. There is no fencing parenchymal hemorrhage, although the images suspected to have arisen from the superior right thalamus. No evidence of subdural or subarachnoid hemorrhage. There are no parenchymal masses. No evidence of an acute ischemic infarct. There are no extra-axial masses. Patchy areas of white matter hypoattenuation are noted bilaterally consistent with mild chronic microvascular ischemic change. Vascular: No hyperdense vessel or unexpected calcification. Skull: Normal. Negative for fracture or focal lesion. Sinuses/Orbits: No acute finding. Other: None. IMPRESSION: 1. Acute intracranial hemorrhage with hemorrhage filling and distending the right lateral ventricle, as well as hemorrhage extending into the third ventricle and layering dependently in the atrium of the left lateral ventricle. There is lateral ventricular enlargement increased when compared to the prior CT suggesting mild hydrocephalus. The origin of the acute intraventricular hemorrhage is likely from the superior right thalamus, where areas of infarction were noted on the brain MRI from 02/03/2019. There is no visualized parenchymal hemorrhage and no evidence of subarachnoid or subdural hemorrhage. No ischemic infarction. Critical Value/emergent results were called by telephone at the time of interpretation on 04/04/2019 at  5:36 pm to provider [facility] , who verbally acknowledged these results. Electronically Signed   By: Lajean Manes M.D.   On: 04/04/2019 17:37   DG Chest Port 1 View  Result Date: 04/04/2019 CLINICAL DATA:  86 year old female with altered mental status. EXAM: PORTABLE CHEST 1 VIEW COMPARISON:  Chest radiograph dated 02/09/2019. FINDINGS: There is no focal consolidation, pleural effusion, pneumothorax. There is mild cardiomegaly. Atherosclerotic calcification of the aorta. Osteopenia with degenerative changes of the spine and shoulders. There is elevation of the humeral head, likely related to chronic rotator cuff injury. Surgical clips noted over the right chest wall. No acute osseous pathology. IMPRESSION: No acute cardiopulmonary process. Electronically Signed   By: Anner Crete M.D.   On: 04/04/2019 17:46    EKG: Independently reviewed.   Assessment/Plan  Intraventricular hemorrhage in the setting of hx of CVA and frequent falls Per ED Physician discussion with radiology, suspect likely could be from a remote infarct rather than trauma. I had an in-depth and length discussion with Daughter (HCPOA) at bedside and also with son over the phone.  I discussed that the risk of any surgical operation far outweighs the benefit per neurosurgery.  She is not likely to survive the procedure.  I also discussed that she will continue to decline based the natural progression of her hemorrhage and extremely unlikely to have any meaningful recovery.  Given the extensive nature of her bleed, she is likely to develop progressive respiratory and cardiac compromise. I discussed the extreme low likelihood of her being able to come off of mechanical ventilation. Daughter understands that her mother is "dying" but would still want her to be FULL CODE and be on a mechanical ventilation if needed while she discuss with family overnight.  PRN hydralazine to keep BP less than 160/110  will do frequent neurochecks  overnight and keep on telemetry Speech evaluation  will need to consult pallative care and likely hospice in the morning for further goals of care discussion   Seizure will convert Keppra to IV form  due to her inability to take in PO   Dementia can continue meds if able to tolerate PO  Hx of breast cancer hold anastrozole until able to tolerate PO  DVT prophylaxis: SCDs Code Status: Full Family Communication: Plan discussed with daughter at bedside and son over the phone disposition Plan: To be determined with at least 2 midnight stays  Consults called:  Admission status: inpatient with at least 2 midnight stay given intraventricular hemorrhage and ongoing goals of care discussion with family   Orene Desanctis DO [facility]   If 7PM-7AM, please contact night-coverage [email or web address]   04/04/2019, 8:20 PM

## 2019-04-04 NOTE — ED Notes (Signed)
Pt cleaned, linen changed, and Pt placed on purewick at this time.

## 2019-04-04 NOTE — ED Triage Notes (Signed)
Arrives via EMS from [facility], C/C AMS since yesterday morning, patient is not talking, eating or drinking, patient is baseline demented. Awakens to voice and pain.   18 LAC

## 2019-04-05 LAB — MRSA PCR SCREENING: MRSA by PCR: NEGATIVE

## 2019-04-05 LAB — BASIC METABOLIC PANEL
Anion gap: 11 (ref 5–15)
BUN: 21 mg/dL (ref 8–23)
CO2: 25 mmol/L (ref 22–32)
Calcium: 9.4 mg/dL (ref 8.9–10.3)
Chloride: 107 mmol/L (ref 98–111)
Creatinine, Ser: 0.64 mg/dL (ref 0.44–1.00)
GFR calc Af Amer: >60 mL/min (ref >60)
GFR calc non Af Amer: >60 mL/min (ref >60)
Glucose, Bld: 144 mg/dL — ABNORMAL HIGH (ref 70–99)
Potassium: 3.4 mmol/L — ABNORMAL LOW (ref 3.5–5.1)
Sodium: 143 mmol/L (ref 135–145)

## 2019-04-05 LAB — CBC
HCT: 40.3 % (ref 36.0–46.0)
Hemoglobin: 12.9 g/dL (ref 12.0–15.0)
MCH: 31.5 pg (ref 26.0–34.0)
MCHC: 32 g/dL (ref 30.0–36.0)
MCV: 98.3 fL (ref 80.0–100.0)
Platelets: 261 10*3/uL (ref 150–400)
RBC: 4.1 MIL/uL (ref 3.87–5.11)
RDW: 13.3 % (ref 11.5–15.5)
WBC: 8.5 10*3/uL (ref 4.0–10.5)
nRBC: 0 % (ref 0.0–0.2)

## 2019-04-05 MED ORDER — HYDRALAZINE HCL 20 MG/ML IJ SOLN
10.0000 mg | Freq: Once | INTRAMUSCULAR | Status: AC
  Administered 2019-04-05: 10 mg via INTRAVENOUS
  Filled 2019-04-05: qty 1

## 2019-04-05 MED ORDER — LABETALOL HCL 5 MG/ML IV SOLN
10.0000 mg | Freq: Once | INTRAVENOUS | Status: AC
  Administered 2019-04-05: 10 mg via INTRAVENOUS
  Filled 2019-04-05: qty 4

## 2019-04-05 MED ORDER — CHLORHEXIDINE GLUCONATE CLOTH 2 % EX PADS
6.0000 | MEDICATED_PAD | Freq: Every day | CUTANEOUS | Status: DC
  Administered 2019-04-05 – 2019-04-10 (×6): 6 via TOPICAL

## 2019-04-05 MED ORDER — ORAL CARE MOUTH RINSE
15.0000 mL | Freq: Two times a day (BID) | OROMUCOSAL | Status: DC
  Administered 2019-04-06 – 2019-04-11 (×11): 15 mL via OROMUCOSAL

## 2019-04-05 MED ORDER — CHLORHEXIDINE GLUCONATE 0.12 % MT SOLN
15.0000 mL | Freq: Two times a day (BID) | OROMUCOSAL | Status: DC
  Administered 2019-04-05 – 2019-04-12 (×14): 15 mL via OROMUCOSAL
  Filled 2019-04-05 (×12): qty 15

## 2019-04-05 MED ORDER — HYDRALAZINE HCL 20 MG/ML IJ SOLN
10.0000 mg | Freq: Four times a day (QID) | INTRAMUSCULAR | Status: DC | PRN
  Administered 2019-04-05 – 2019-04-07 (×3): 10 mg via INTRAVENOUS
  Filled 2019-04-05 (×3): qty 1

## 2019-04-05 NOTE — Progress Notes (Signed)
SLP Cancellation Note  Patient Details Name: Jocelyn Mccann MRN: [id number] DOB: [birth year]   Cancelled treatment:       Reason Eval/Treat Not Completed: Fatigue/lethargy limiting ability to participate(per chart, pt has been somnolent, responds only to pain )  Also note per documentation very poor prognosis and pt full code until son arrives.   SLP to call in am to speak to RN to determine if pt is ready for a swallow evaluation.   Kathleen Lime, MS [facility] SLP Acute Rehab Services Office [phone]   Jocelyn Mccann 04/05/2019, 6:09 PM

## 2019-04-05 NOTE — ED Notes (Signed)
Pt continues to rest in bed comfortably. NAD noted. Pt repositioned in bed at this time.

## 2019-04-05 NOTE — ED Notes (Signed)
Pt resting in bed comfortably. NAD noted. Equal rise and fall of chest noted.

## 2019-04-05 NOTE — ED Notes (Signed)
Dahal MD notified about pts BP and this RN inquired if there was any PRN medication needed. Awaiting orders.

## 2019-04-05 NOTE — Progress Notes (Addendum)
PROGRESS NOTE  Jocelyn Mccann  DOB: 18-Aug-1928  PCP: [name], MD [id number]  DOA: 04/04/2019 Admitted From: [facility]  LOS: 1 day   Chief Complaint  Patient presents with  . Altered Mental Status   Brief narrative: Jocelyn Mccann is a 86 y.o. female with PMH significant for for Alzheimer's disease, CVA, dysphagia, history of subdural hematoma with seizures, history of right breast cancer s/p lumpectomy on treatment with anastrozole.  Patient was to the ED with concerns of decreased responsiveness. Per chart, patient has been progressively declining physically and mentally over the last year. Back in January 2021 patient had an unwitnessed fall and was found to be obtunded and had seizures.    She was hospitalized, intubated, managed for septic shock due to UTI, had Klebsiella pneumonia.  She was discharged to [facility] from rehab. Patient reportedly has had several falls in the last 2 months. Twice she was found at the side of her bed and sometime in March she fell out of wheelchair and hit her head. Daughter reports that she was told by the facility that patient has had decreased appetite, decreased responsiveness for last 2 days and was hence sent to the ED.  In the ED, patient was hypertensive but otherwise stable on room air. She was only responsive to painful stimuli.  She was hypertensive but otherwise stable on room air.   CT head showed intraventricular hemorrhage into both lateral ventricles and extends to the third ventricle with mild hydrocephalus.   Neurosurgery was consulted by ED physician.  Per neurosurgery, the risks of on outweigh the benefits of an external ventricular drain placement.   Patient was admitted to medicine service.  Subjective: Patient was seen and examined this morning.  Remains somnolent.  Moans and tries to open eyes on sternal rub is the best response.  Assessment/Plan: Intraventricular hemorrhage in the setting of hx of CVA and  frequent falls -Per radiology, patient most likely bled from a remote thalamic infarct than on trauma. -Per neurosurgery, she is not a candidate for surgical intervention based on her age, comorbidities. -I had a long phone conversation with patient's daughter and son this morning.  Patient probably will continue to decline based the natural progression of her hemorrhage and extremely unlikely to have any meaningful recovery.   -Given the extensive nature of her bleed, she is likely to develop progressive respiratory and cardiac compromise. I discussed the extreme low likelihood of her being able to come off of mechanical ventilation. I mentioned to the family that although we cannot have an exact determination of patient's expected death, she is unlikely to survive more than next few days.  -Family understands her poor prognosis and would like to keep patient full code till the son is able to visit him.  Seizure -IV Keppra  Dementia -Meds on hold  Hx of breast cancer -Meds on hold  DVT prophylaxis: SCDs Code Status: Full Family Communication: Plan discussed with daughter at bedside and son over the phone disposition Plan: To be determined with at least 2 midnight stays  Consults called:  Admission status: inpatient with at least 2 midnight stay given intraventricular hemorrhage and ongoing goals of care discussion with family  Code Status:  Full code for now DVT prophylaxis:  SCDs Diet: N.p.o. Mobility: Unlikely to improve or participate with physical therapy Family Communication:  See above Discharge plan:  Anticipated date and disposition: Patient at risk of death while in the hospital.  Consultants: Neurosurgery  Antimicrobials: Anti-infectives (From admission, onward)   None        Code Status: Full Code   Diet Order            Diet NPO time specified  Diet effective now              Infusions:  . levETIRAcetam Stopped (04/05/19 0823)    Scheduled  Meds: . donepezil  5 mg Oral QHS  . QUEtiapine  12.5 mg Oral QHS  . sertraline  25 mg Oral Daily    PRN meds:    Objective: Vitals:   04/05/19 1230 04/05/19 1400  BP: (!) 159/89 (!) 177/83  Pulse:  93  Resp: 19 19  Temp:    SpO2:  98%   No intake or output data in the 24 hours ending 04/05/19 1415 Filed Weights   04/04/19 1620  Weight: 62.6 kg   Weight change:  Body mass index is 21.6 kg/m.   Physical Exam: General exam: Comatose Skin: No rashes, lesions or ulcers. HEENT: Atraumatic, normocephalic, supple neck, no obvious bleeding Lungs: Clear to auscultation bilaterally CVS: Regular rate and rhythm, no murmur GI/Abd soft, nontender, nondistended, bowel sounds present CNS: Deeply comatose Psychiatry: Unable to examine Extremities: No pedal edema, no calf tenderness  Data Review: I have personally reviewed the laboratory data and studies available.  Recent Labs  Lab 04/04/19 1646 04/05/19 0435  WBC 6.7 8.5  NEUTROABS 4.1  --   HGB 13.2 12.9  HCT 41.2 40.3  MCV 99.5 98.3  PLT 261 261   Recent Labs  Lab 04/04/19 1646 04/05/19 0435  NA 144 143  K 3.4* 3.4*  CL 108 107  CO2 28 25  GLUCOSE 109* 144*  BUN 32* 21  CREATININE 0.89 0.64  CALCIUM 9.5 9.4    Signed, Terrilee Croak, MD [facility] Pager: [phone] (Secure Chat preferred). 04/05/2019

## 2019-04-06 ENCOUNTER — Encounter: Payer: Self-pay | Admitting: Internal Medicine

## 2019-04-06 LAB — URINE CULTURE: Culture: >=100000 — AB

## 2019-04-06 MED ORDER — SODIUM CHLORIDE 0.9 % IV SOLN
1.0000 g | INTRAVENOUS | Status: DC
  Administered 2019-04-06 – 2019-04-09 (×4): 1 g via INTRAVENOUS
  Filled 2019-04-06 (×3): qty 1
  Filled 2019-04-06: qty 10

## 2019-04-06 MED ORDER — ACETAMINOPHEN 650 MG RE SUPP
650.0000 mg | Freq: Four times a day (QID) | RECTAL | Status: DC | PRN
  Administered 2019-04-06 – 2019-04-09 (×2): 650 mg via RECTAL
  Filled 2019-04-06 (×2): qty 1

## 2019-04-06 MED ORDER — DEXTROSE-NACL 5-0.45 % IV SOLN: INTRAVENOUS | Status: AC

## 2019-04-06 NOTE — Progress Notes (Signed)
SLP Cancellation Note  Patient Details Name: Jocelyn Mccann MRN: [id number] DOB: 1928-08-06   Cancelled treatment:       Reason Eval/Treat Not Completed: Fatigue/lethargy limiting ability to participate.  RN reported that pt is not very responsive at this time.  SLP will f/u for a BSE as appropriate.     [name] Jocelyn Mccann 04/06/2019, 11:43 AM

## 2019-04-06 NOTE — Progress Notes (Signed)
NUTRITION NOTE  Patient screened for MST. Patient noted to speak Spanish; unsure if she needs an interpretor. She is noted to be disoriented x4. Reviewed notes from yesterday, most notably SLP note from yesterday evening.   Able to talk with nurse outside of patient's room. She reports awaiting patient's son as he wants to see/spend time with patient before making her DNR. Notes indicate very poor prognosis. Nurse states likely moving to a Palliative approach after son's arrival, who is [place] in from [place] of state.   Patient has been NPO since admission. Weight on 3/24 was 138 lb and weight on 3/25 was 126 lb. Weight on 02/16/19 noted to be 160 lb, but weight on 02/13/19 was 119 lb and weight from 12/05/18-01/31/19 was ~135 lb.   Will complete full assessment next week if that becomes appropriate/warranted.      Jarome Matin, MS, RD, LDN, CNSC Inpatient Clinical Dietitian RD pager # available in [place]  After hours/weekend pager # available in [facility]

## 2019-04-06 NOTE — Progress Notes (Signed)
PROGRESS NOTE  Jocelyn Mccann  DOB: 1928-08-04  PCP: [name], MD QH:[id number]  DOA: 04/04/2019 Admitted From: [facility]  LOS: 2 days   Chief Complaint  Patient presents with  . Altered Mental Status   Brief narrative: Jocelyn Mccann is a 86 y.o. female with PMH significant for for Alzheimer's disease, CVA, dysphagia, history of subdural hematoma with seizures, history of right breast cancer s/p lumpectomy on treatment with anastrozole.  Patient was to the ED with concerns of decreased responsiveness. Per chart, patient has been progressively declining physically and mentally over the last year. Back in January 2021 patient had an unwitnessed fall and was found to be obtunded and had seizures.    She was hospitalized, intubated, managed for septic shock due to UTI, had Klebsiella pneumonia.  She was discharged to [facility] from rehab. Patient reportedly has had several falls in the last 2 months. Twice she was found at the side of her bed and sometime in March she fell out of wheelchair and hit her head. Daughter reports that she was told by the facility that patient has had decreased appetite, decreased responsiveness for last 2 days and was hence sent to the ED.  In the ED, patient was hypertensive but otherwise stable on room air. She was only responsive to painful stimuli.  She was hypertensive but otherwise stable on room air.   CT head showed intraventricular hemorrhage into both lateral ventricles and extends to the third ventricle with mild hydrocephalus.   Neurosurgery was consulted by ED physician.  Per neurosurgery, the risks of on outweigh the benefits of an external ventricular drain placement.   Patient was admitted to medicine service.  Subjective: Patient was seen and examined this morning.  Remains somnolent.  No notes of improvement in last 24 hours.  Patient moans on sternal rub and that's the best response.    Assessment/Plan: Intraventricular  hemorrhage in the setting of hx of CVA and frequent falls -Per radiology, patient most likely bled from a remote thalamic infarct than on trauma. -Per neurosurgery, she is not a candidate for surgical intervention based on her age, comorbidities. -I had a long phone conversation with patient's daughter and son on 3/25.  Patient probably will continue to decline based the natural progression of her hemorrhage and extremely unlikely to have any meaningful recovery.   -Given the extensive nature of her bleed, she is likely to develop progressive respiratory and cardiac compromise. I discussed the extreme low likelihood of her being able to come off of mechanical ventilation. I mentioned to the family that although we cannot have an exact determination of patient's expected death, she is unlikely to survive more than next few days.  -Family understands her poor prognosis and would like to keep patient full code till the son is able to visit her.  Seizure -IV Keppra  Dementia -Meds on hold  Hx of breast cancer -Meds on hold  Code Status:  Full code for now DVT prophylaxis:  SCDs Diet: N.p.o. unable to be fed because of altered mentation.  Start the patient on D5 half NS at 50 mils per hour. Mobility: Unlikely to improve or participate with physical therapy Family Communication:  See above Discharge plan:  Anticipated date and disposition: Patient at risk of death while in the hospital.  Consultants: Neurosurgery  Antimicrobials: Anti-infectives (From admission, onward)   Start     Dose/Rate Route Frequency Ordered Stop   04/06/19 0900  cefTRIAXone (ROCEPHIN) 1 g  in sodium chloride 0.9 % 100 mL IVPB     1 g 200 mL/hr over 30 Minutes Intravenous Every 24 hours 04/06/19 0802          Code Status: Full Code   Diet Order            Diet NPO time specified  Diet effective now              Infusions:  . cefTRIAXone (ROCEPHIN)  IV Stopped (04/06/19 0851)  . dextrose 5 % and  0.45% NaCl    . levETIRAcetam Stopped (04/06/19 [id number])    Scheduled Meds: . chlorhexidine  15 mL Mouth Rinse BID  . Chlorhexidine Gluconate Cloth  6 each Topical Daily  . donepezil  5 mg Oral QHS  . mouth rinse  15 mL Mouth Rinse q12n4p  . QUEtiapine  12.5 mg Oral QHS  . sertraline  25 mg Oral Daily    PRN meds:    Objective: Vitals:   04/06/19 1600 04/06/19 1700  BP: (!) 148/68 (!) 160/60  Pulse: 88 90  Resp: (!) 29 (!) 21  Temp:    SpO2: 98% 99%    Intake/Output Summary (Last 24 hours) at 04/06/2019 1729 Last data filed at 04/06/2019 1000 Gross per 24 hour  Intake 469.04 ml  Output --  Net 469.04 ml   Filed Weights   04/04/19 1620 04/05/19 2000  Weight: 62.6 kg 57.2 kg   Weight change: -5.351 kg Body mass index is 20.35 kg/m.   Physical Exam: General exam: Comatose Skin: No rashes, lesions or ulcers. HEENT: Atraumatic, normocephalic, supple neck, no obvious bleeding Lungs: Clear to auscultation bilaterally CVS: Regular rate and rhythm, no murmur GI/Abd soft, nontender, nondistended, bowel sounds present CNS: Deeply comatose.  Only moans on sternal rub. Psychiatry: Unable to examine Extremities: No pedal edema, no calf tenderness  Data Review: I have personally reviewed the laboratory data and studies available.  Recent Labs  Lab 04/04/19 1646 04/05/19 0435  WBC 6.7 8.5  NEUTROABS 4.1  --   HGB 13.2 12.9  HCT 41.2 40.3  MCV 99.5 98.3  PLT 261 261   Recent Labs  Lab 04/04/19 1646 04/05/19 0435  NA 144 143  K 3.4* 3.4*  CL 108 107  CO2 28 25  GLUCOSE 109* 144*  BUN 32* 21  CREATININE 0.89 0.64  CALCIUM 9.5 9.4    Signed, Terrilee Croak, MD [facility] Pager: [phone] (Secure Chat preferred). 04/06/2019

## 2019-04-07 DIAGNOSIS — I615 Nontraumatic intracerebral hemorrhage, intraventricular: Principal | ICD-10-CM

## 2019-04-07 LAB — GLUCOSE, CAPILLARY: Glucose-Capillary: 105 mg/dL — ABNORMAL HIGH (ref 70–99)

## 2019-04-07 MED ORDER — LABETALOL HCL 5 MG/ML IV SOLN
10.0000 mg | Freq: Once | INTRAVENOUS | Status: AC
  Administered 2019-04-07: 10 mg via INTRAVENOUS
  Filled 2019-04-07: qty 4

## 2019-04-07 MED ORDER — LABETALOL HCL 5 MG/ML IV SOLN
5.0000 mg | Freq: Once | INTRAVENOUS | Status: AC
  Administered 2019-04-07: 5 mg via INTRAVENOUS
  Filled 2019-04-07: qty 4

## 2019-04-07 MED ORDER — HYDRALAZINE HCL 20 MG/ML IJ SOLN
10.0000 mg | Freq: Four times a day (QID) | INTRAMUSCULAR | Status: DC
  Administered 2019-04-07 – 2019-04-12 (×21): 10 mg via INTRAVENOUS
  Filled 2019-04-07 (×21): qty 1

## 2019-04-07 MED ORDER — LABETALOL HCL 5 MG/ML IV SOLN
10.0000 mg | Freq: Four times a day (QID) | INTRAVENOUS | Status: DC | PRN
  Administered 2019-04-07: 10 mg via INTRAVENOUS
  Filled 2019-04-07: qty 4

## 2019-04-07 NOTE — Consult Note (Signed)
Palliative Care Consult Note  Reason for consult: Goals of care in light of intracranial hemorrhage  Palliative Care consult received.  Chart reviewed including personal review of pertinent labs and imaging.  Of note, our team had met with Jocelyn Mccann during previous admission in January of this year where she was intubated from 1/21 until 1/24.  At that time MOST form was completed with daughter with plan for full code/full scope treatment moving forward.  Briefly, Jocelyn Mccann is an 86 year old female with past medical history of Alzheimer's disease, CVA, dysphagia, subdural hematoma with seizures, right breast cancer status post lumpectomy on treatment with anastrozole who presented to the ED with decreased responsiveness and was found to have intraventricular hemorrhage into both ventricles that extends the third ventricle with mild hydrocephalus and midline shift.  Neurosurgery consulted and not a good candidate for external ventricular drain placement.  Palliative consulted for goals of care.  I saw and examined Jocelyn Mccann today.  She is lying in bed and minimally responsive.  When speaking with her she was nonverbal, but it did appear she was attempting at times to open her eyes.  Chart review reveals that family has been in discussion with other providers and is aware of the fact that this is likely nonsurvivable event.  At the same time, family has requested continuation of any and all aggressive interventions including intubation and CPR in the event of cardiac or respiratory arrest.  Family has not been to the bedside today and I attempted to call in order to further discuss care plan moving forward.  I was unable to reach family and unable to leave a voicemail.  -Jocelyn Mccann is unlikely to survive this hospitalization.  I do not believe that aggressive interventions such as mechanical ventilation or CPR in the event of cardiac or respiratory arrest will add quality time to her life.  Family has  documented to understand poor prognosis but wanting to maintain full CODE STATUS until her family is able to spend more time with her.  We will continue to try to reach family to discuss goals moving forward (did not answer phone and not at the bedside today).  Our team had recently met with family in January of this year and their desire at that point in time was continuation of full code/full scope treatment as documented on MOST form in her chart.  Time in: 1700 Time out: 1730 Total time: 30 minutes  Greater than 50%  of this time was spent counseling and coordinating care related to the above assessment and plan.  Micheline Rough, MD [facility] Team [phone]

## 2019-04-07 NOTE — Progress Notes (Signed)
Patient BP 183/112. Not time for hydralazine. Paged MD. Orders changed to hydralazine scheduled q6h and labetalol ordered PRN.

## 2019-04-07 NOTE — Progress Notes (Signed)
PROGRESS NOTE  Jocelyn Mccann  DOB: [birth year]  PCP: [name], MD [id number]  DOA: 04/04/2019 Admitted From: [facility]  LOS: 3 days   Chief Complaint  Patient presents with  . Altered Mental Status   Brief narrative: Jocelyn Mccann is a 86 y.o. female with PMH significant for for Alzheimer's disease, CVA, dysphagia, history of subdural hematoma with seizures, history of right breast cancer s/p lumpectomy on treatment with anastrozole.  Patient was to the ED with concerns of decreased responsiveness. Per chart, patient has been progressively declining physically and mentally over the last year. Back in January 2021 patient had an unwitnessed fall and was found to be obtunded and had seizures.    She was hospitalized, intubated, managed for septic shock due to UTI, had Klebsiella pneumonia.  She was discharged to [facility] from rehab. Patient reportedly has had several falls in the last 2 months. Twice she was found at the side of her bed and sometime in March she fell out of wheelchair and hit her head. Daughter reports that she was told by the facility that patient has had decreased appetite, decreased responsiveness for last 2 days and was hence sent to the ED.  In the ED, patient was hypertensive but otherwise stable on room air. She was only responsive to painful stimuli.  She was hypertensive but otherwise stable on room air.   CT head showed intraventricular hemorrhage into both lateral ventricles and extends to the third ventricle with mild hydrocephalus.   Neurosurgery was consulted by ED physician.  Per neurosurgery, the risks of on outweigh the benefits of an external ventricular drain placement.   Patient was admitted to medicine service.  Subjective: Patient was seen and examined this morning.   No change in status last 48 hours.  Patient seems to be withdrawing on sternal rub at the best.  Assessment/Plan: Intraventricular hemorrhage in the setting of hx of  CVA and frequent falls -Per radiology, patient most likely bled from a remote thalamic infarct than on trauma. -Per neurosurgery, she is not a candidate for surgical intervention based on her age, comorbidities. -Given the extensive nature of her bleed, she is likely to develop progressive respiratory and cardiac compromise. -I had a long phone conversation with patient's daughter and son on 3/25 and 3/26.  Patient probably will continue to decline based the natural progression of her hemorrhage and extremely unlikely to have any meaningful recovery. I discussed the extreme low likelihood of her being able to come off of mechanical ventilation. I mentioned to the family that although we cannot have an exact determination of patient's expected death, she is unlikely to survive more than next few days.  -Family understands her poor prognosis but would still like to keep patient full code till the son is able to visit her. -Palliative care consultation called.  UTI due to Klebsiella pneumoniae -Patient had one episode of fever 101.3 on 3/26. -Urine culture is showing more than 100,000 CFU per mL of Klebsiella pneumoniae.  Currently on IV Rocephin  Elevated blood pressure -Patient has history of hypertension per chart.  I do not see any blood pressure medicine in her list of home medicines. -Blood pressure in the hospital has been running high probably to maintain cerebral perfusion in the setting of intracranial hypertension due to intracranial bleeding. -Because her blood pressure has been consistently elevated, today I started her on a scheduled hydralazine IV every 6 hours and as needed labetalol every 6 hours.  Seizure -IV Keppra  Dementia -Meds on hold  Hx of breast cancer -Meds on hold  Code Status:  Full code for now DVT prophylaxis:  SCDs Diet: N.p.o. unable to be fed because of altered mentation.  Currently on D5 half NS at 50 mils per hour. Mobility: Unlikely to improve or  participate with physical therapy on his mental status significantly improves. Family Communication:  See above Discharge plan:  Anticipated date and disposition: Patient at risk of death while in the hospital.  Consultants: Neurosurgery  Antimicrobials: Anti-infectives (From admission, onward)   Start     Dose/Rate Route Frequency Ordered Stop   04/06/19 0900  cefTRIAXone (ROCEPHIN) 1 g in sodium chloride 0.9 % 100 mL IVPB     1 g 200 mL/hr over 30 Minutes Intravenous Every 24 hours 04/06/19 0802          Code Status: Full Code   Diet Order            Diet NPO time specified  Diet effective now              Infusions:  . cefTRIAXone (ROCEPHIN)  IV Stopped (04/06/19 0851)  . levETIRAcetam Stopped (04/07/19 0745)    Scheduled Meds: . chlorhexidine  15 mL Mouth Rinse BID  . Chlorhexidine Gluconate Cloth  6 each Topical Daily  . donepezil  5 mg Oral QHS  . mouth rinse  15 mL Mouth Rinse q12n4p  . QUEtiapine  12.5 mg Oral QHS  . sertraline  25 mg Oral Daily    PRN meds:    Objective: Vitals:   04/07/19 0642 04/07/19 0700  BP:  (!) 158/83  Pulse:  93  Resp:  15  Temp: (!) 97.4 F (36.3 C)   SpO2:  100%    Intake/Output Summary (Last 24 hours) at 04/07/2019 0740 Last data filed at 04/07/2019 0721 Gross per 24 hour  Intake 790.51 ml  Output 600 ml  Net 190.51 ml   Filed Weights   04/04/19 1620 04/05/19 2000  Weight: 62.6 kg 57.2 kg   Weight change:  Body mass index is 20.35 kg/m.   Physical Exam: General exam: Thin elderly African-American female.  Remains comatose Skin: No rashes, lesions or ulcers. HEENT: Atraumatic, normocephalic, supple neck, no obvious bleeding Lungs: Clear to auscultation bilaterally CVS: Regular rate and rhythm, no murmur GI/Abd soft, nontender, nondistended, bowel sounds present CNS: Deeply comatose.  Only moans on sternal rub. Psychiatry: Unable to examine Extremities: No pedal edema, no calf tenderness  Data Review: I  have personally reviewed the laboratory data and studies available.  Recent Labs  Lab 04/04/19 1646 04/05/19 0435  WBC 6.7 8.5  NEUTROABS 4.1  --   HGB 13.2 12.9  HCT 41.2 40.3  MCV 99.5 98.3  PLT 261 261   Recent Labs  Lab 04/04/19 1646 04/05/19 0435  NA 144 143  K 3.4* 3.4*  CL 108 107  CO2 28 25  GLUCOSE 109* 144*  BUN 32* 21  CREATININE 0.89 0.64  CALCIUM 9.5 9.4    Signed, Terrilee Croak, MD [facility] Pager: [phone] (Secure Chat preferred). 04/07/2019

## 2019-04-08 DIAGNOSIS — R4182 Altered mental status, unspecified: Secondary | ICD-10-CM

## 2019-04-08 LAB — GLUCOSE, CAPILLARY
Glucose-Capillary: 111 mg/dL — ABNORMAL HIGH (ref 70–99)
Glucose-Capillary: 112 mg/dL — ABNORMAL HIGH (ref 70–99)
Glucose-Capillary: 122 mg/dL — ABNORMAL HIGH (ref 70–99)

## 2019-04-08 LAB — BASIC METABOLIC PANEL
Anion gap: 9 (ref 5–15)
BUN: 32 mg/dL — ABNORMAL HIGH (ref 8–23)
CO2: 24 mmol/L (ref 22–32)
Calcium: 9.7 mg/dL (ref 8.9–10.3)
Chloride: 112 mmol/L — ABNORMAL HIGH (ref 98–111)
Creatinine, Ser: 0.79 mg/dL (ref 0.44–1.00)
GFR calc Af Amer: >60 mL/min (ref >60)
GFR calc non Af Amer: >60 mL/min (ref >60)
Glucose, Bld: 124 mg/dL — ABNORMAL HIGH (ref 70–99)
Potassium: 3.5 mmol/L (ref 3.5–5.1)
Sodium: 145 mmol/L (ref 135–145)

## 2019-04-08 LAB — CBC WITH DIFFERENTIAL/PLATELET
Abs Immature Granulocytes: 0.01 10*3/uL (ref 0.00–0.07)
Basophils Absolute: 0 10*3/uL (ref 0.0–0.1)
Basophils Relative: 1 %
Eosinophils Absolute: 0.1 10*3/uL (ref 0.0–0.5)
Eosinophils Relative: 1 %
HCT: 38.4 % (ref 36.0–46.0)
Hemoglobin: 12.4 g/dL (ref 12.0–15.0)
Immature Granulocytes: 0 %
Lymphocytes Relative: 23 %
Lymphs Abs: 1.6 10*3/uL (ref 0.7–4.0)
MCH: 31.8 pg (ref 26.0–34.0)
MCHC: 32.3 g/dL (ref 30.0–36.0)
MCV: 98.5 fL (ref 80.0–100.0)
Monocytes Absolute: 0.8 10*3/uL (ref 0.1–1.0)
Monocytes Relative: 11 %
Neutro Abs: 4.4 10*3/uL (ref 1.7–7.7)
Neutrophils Relative %: 64 %
Platelets: 271 10*3/uL (ref 150–400)
RBC: 3.9 MIL/uL (ref 3.87–5.11)
RDW: 14 % (ref 11.5–15.5)
WBC: 6.8 10*3/uL (ref 4.0–10.5)
nRBC: 0 % (ref 0.0–0.2)

## 2019-04-08 NOTE — Evaluation (Signed)
Clinical/Bedside Swallow Evaluation Patient Details  Name: Jocelyn Mccann MRN: [id number] Date of Birth: [birth year]  Today's Date: 04/08/2019 Time: SLP Start Time (ACUTE ONLY): 0940 SLP Stop Time (ACUTE ONLY): 0954 SLP Time Calculation (min) (ACUTE ONLY): 14 min  Past Medical History:  Past Medical History:  Diagnosis Date  . Acute cerebrovascular accident (CVA) of cerebellum ([place]) 02/17/2019  . Bilateral knee pain   . Breast cancer ([place])    diagnosed 11/2017  . Dementia ([place])   . Osteoporosis   . Seizures ([place]) 02/06/2019  . Urge incontinence of urine    Past Surgical History:  Past Surgical History:  Procedure Laterality Date  . BREAST LUMPECTOMY Right 2001  . BREAST LUMPECTOMY Right 12/2017  . BREAST LUMPECTOMY WITH SENTINEL LYMPH NODE BIOPSY Right 2001  . BREAST LUMPECTOMY WITH SENTINEL LYMPH NODE BIOPSY Right 12/30/2017   Procedure: RIGHT BREAST LUMPECTOMY WITH SENTINEL LYMPH NODE BIOPSY ERAS PATHWAY;  Surgeon: Jocelyn Klein, MD;  Location: [facility];  Service: General;  Laterality: Right;  . DENTAL SURGERY     HPI:  Briefly, [name] is an 86 year old female with past medical history of Alzheimer's disease, CVA, dysphagia, subdural hematoma with seizures, right breast cancer status post lumpectomy on treatment with anastrozole who presented to the ED with decreased responsiveness and was found to have intraventricular hemorrhage into both ventricles that extends the third ventricle with mild hydrocephalus and midline shift.  Neurosurgery consulted and not a good candidate for external ventricular drain placement.   Assessment / Plan / Recommendation Clinical Impression  Pt was seen for a bedside swallow evaluation.  Her eyes remained closed throughout this evaluation; however, she was able to follow some commands and verbalize during this session.  She was observed to be talking in [place] upon SLP arrival and pt stated that she was okay with proceeding with this evaluation in  [place].  Oral mechanism exam was not completed secondary to pt being unable to follow commands, but oral cavity was noted to be dry with some missing dentition.  Pt consumed trials of ice chips, thin liquid (tsp/straw), and puree.  She exhibited oral holding with the ice chip trial with eventual swallow initiation given verbal encouragement.  Pt had good bolus acceptance via spoon and straw, and she independently drew liquid from the straw during thin liquid trials.  Signs of dysphagia were present including suspected delayed swallow initiation with all trials and multiple swallows per bolus intermittently.  Pt exhibited clinical s/sx of aspiration with thin liquid via straw sip evidenced by a prolonged, weak cough following 2/10 trials.  No overt s/sx of aspiration were noted with puree or thin liquid via tsp.  Recommend initiation of Dysphagia 1 (puree) solids and thin liquids via TSP ONLY with essential meds administered crushed in puree and strict adherence to the following precautions: 1) Only feed when pt awake/alert 2) Small meals 3) Small bites/sips 4) Slow rate of intake 5) Sit upright as possible.  Sign with recommendations was hung above the pt's bed and RN was made aware.  SLP will f/u closely to monitor diet tolerance.    SLP Visit Diagnosis: Dysphagia, unspecified (R13.10)    Aspiration Risk  Mild aspiration risk    Diet Recommendation Dysphagia 1 (Puree);Thin liquid   Liquid Administration via: Spoon Medication Administration: Crushed with puree Supervision: Staff to assist with self feeding;Full supervision/cueing for compensatory strategies Compensations: Minimize environmental distractions;Slow rate;Small sips/bites Postural Changes: Seated upright at 90 degrees    Other  Recommendations Oral  Care Recommendations: Staff/trained caregiver to provide oral care;Oral care BID   Follow up Recommendations Skilled Nursing facility;24 hour supervision/assistance      Frequency and  Duration min 2x/week  2 weeks       Prognosis Prognosis for Safe Diet Advancement: Guarded Barriers to Reach Goals: Cognitive deficits;Severity of deficits      Swallow Study   General Date of Onset: 04/04/19 HPI: Briefly, [name] is an 86 year old female with past medical history of Alzheimer's disease, CVA, dysphagia, subdural hematoma with seizures, right breast cancer status post lumpectomy on treatment with anastrozole who presented to the ED with decreased responsiveness and was found to have intraventricular hemorrhage into both ventricles that extends the third ventricle with mild hydrocephalus and midline shift.  Neurosurgery consulted and not a good candidate for external ventricular drain placement. Type of Study: Bedside Swallow Evaluation Previous Swallow Assessment: BSE on 02/06/19 Diet Prior to this Study: NPO Temperature Spikes Noted: Yes Respiratory Status: Room air History of Recent Intubation: No Behavior/Cognition: Lethargic/Drowsy;Confused Oral Cavity Assessment: Dry Oral Care Completed by SLP: Recent completion by staff Oral Cavity - Dentition: Missing dentition Patient Positioning: Upright in bed Baseline Vocal Quality: Low vocal intensity Volitional Cough: Weak Volitional Swallow: Unable to elicit    Oral/Motor/Sensory Function Overall Oral Motor/Sensory Function: (Unable to assess - pt not able to follow commands )   Ice Chips Ice chips: Impaired Presentation: Spoon Oral Phase Impairments: Poor awareness of bolus Oral Phase Functional Implications: Oral holding Pharyngeal Phase Impairments: Suspected delayed Swallow   Thin Liquid Thin Liquid: Impaired Presentation: Spoon;Straw Oral Phase Functional Implications: Prolonged oral transit Pharyngeal  Phase Impairments: Suspected delayed Swallow;Cough - Immediate    Nectar Thick Nectar Thick Liquid: Not tested   Honey Thick Honey Thick Liquid: Not tested   Puree Puree: Within functional  limits Presentation: Spoon   Solid     Solid: Not tested     Jocelyn Mccann M.S., [place] Office: [phone]  [place] 04/08/2019,10:07 AM

## 2019-04-08 NOTE — Progress Notes (Signed)
PROGRESS NOTE  Jocelyn Mccann  DOB: 1928/08/09  PCP: [name], MD [id number]  DOA: 04/04/2019 Admitted From: [facility]  LOS: 4 days   Chief Complaint  Patient presents with  . Altered Mental Status   Brief narrative: Jocelyn Mccann is a 86 y.o. female with PMH significant for for Alzheimer's disease, CVA, dysphagia, history of subdural hematoma with seizures, history of right breast cancer s/p lumpectomy on treatment with anastrozole.  Patient was to the ED with concerns of decreased responsiveness. Per chart, patient has been progressively declining physically and mentally over the last year. Back in January 2021 patient had an unwitnessed fall and was found to be obtunded and had seizures.    She was hospitalized, intubated, managed for septic shock due to UTI, had Klebsiella pneumonia.  She was discharged to [facility] from rehab. Patient reportedly has had several falls in the last 2 months. Twice she was found at the side of her bed and sometime in March she fell out of wheelchair and hit her head. Daughter reports that she was told by the facility that patient has had decreased appetite, decreased responsiveness for last 2 days and was hence sent to the ED.  In the ED, patient was hypertensive but otherwise stable on room air. She was only responsive to painful stimuli.  She was hypertensive but otherwise stable on room air.   CT head showed intraventricular hemorrhage into both lateral ventricles and extends to the third ventricle with mild hydrocephalus.   Neurosurgery was consulted by ED physician.  Per neurosurgery, the risks of on outweigh the benefits of an external ventricular drain placement.   Patient was admitted to medicine service.  Subjective: Patient was seen and examined this morning.   She seems more awake today.  She is able to tell me that she is in the hospital.    Assessment/Plan: Intraventricular hemorrhage in the setting of hx of CVA and  frequent falls -Per radiology, patient most likely bled from a remote thalamic infarct than on trauma. -Per neurosurgery, she is not a candidate for surgical intervention based on her age, comorbidities. -Given the extensive nature of her bleed, she is likely to develop progressive respiratory and cardiac compromise. -I had a long phone conversation with patient's daughter and son on 3/25 and 3/26.  Patient probably will continue to decline based the natural progression of her hemorrhage and extremely unlikely to have any meaningful recovery. I discussed the extreme low likelihood of her being able to come off of mechanical ventilation. I mentioned to the family that although we cannot have an exact determination of patient's expected death, she is unlikely to survive more than next few days.  -Family understands her poor prognosis but would still like to keep patient full code till the son is able to visit her. -Palliative care consultation called.  Acute encephalopathy -Primarily due to intracranial hemorrhage.  Also contributed by UTI. -Patient was deeply comatose for first 2 days.  She is gradually waking up, today she is able to tell me that she is in the hospital. -Mental status may continue to improve up to a point if [place] does not get worse.  Not sure if patient will have her meaningful recovery of cognition.  UTI due to Klebsiella pneumoniae -Patient had one episode of fever 101.3 on 3/26. -Urine culture is showing more than 100,000 CFU per mL of Klebsiella pneumoniae.  Currently on IV Rocephin  Elevated blood pressure -Patient has history of hypertension  per chart.  I do not see any blood pressure medicine in her list of home medicines. -Blood pressure in the hospital has been running high probably to maintain cerebral perfusion in the setting of intracranial hypertension due to intracranial bleeding. -Because her blood pressure has been consistently elevated, today I started her on a  scheduled hydralazine IV every 6 hours and as needed labetalol every 6 hours.    Seizure -IV Keppra  Dementia -Meds on hold  Hx of breast cancer -Meds on hold  Code Status:  Full code for now DVT prophylaxis:  SCDs Diet: Dysphagia 1 diet per speech therapy.   Mobility: Unlikely to improve or participate with physical therapy on his mental status significantly improves. Family Communication:  See above Discharge plan:  Anticipated date and disposition: Patient at risk of death while in the hospital.  Consultants: Neurosurgery  Antimicrobials: Anti-infectives (From admission, onward)   Start     Dose/Rate Route Frequency Ordered Stop   04/06/19 0900  cefTRIAXone (ROCEPHIN) 1 g in sodium chloride 0.9 % 100 mL IVPB     1 g 200 mL/hr over 30 Minutes Intravenous Every 24 hours 04/06/19 0802          Code Status: Full Code   Diet Order            DIET - DYS 1 Room service appropriate? No; Fluid consistency: Thin  Diet effective now              Infusions:  . cefTRIAXone (ROCEPHIN)  IV Stopped (04/08/19 1319)  . levETIRAcetam Stopped (04/08/19 1110)    Scheduled Meds: . chlorhexidine  15 mL Mouth Rinse BID  . Chlorhexidine Gluconate Cloth  6 each Topical Daily  . donepezil  5 mg Oral QHS  . hydrALAZINE  10 mg Intravenous Q6H  . mouth rinse  15 mL Mouth Rinse q12n4p  . QUEtiapine  12.5 mg Oral QHS  . sertraline  25 mg Oral Daily    PRN meds:    Objective: Vitals:   04/08/19 1200 04/08/19 1300  BP: (!) 155/76 (!) 154/92  Pulse:    Resp: (!) 23 (!) 21  Temp:    SpO2:      Intake/Output Summary (Last 24 hours) at 04/08/2019 1431 Last data filed at 04/08/2019 1211 Gross per 24 hour  Intake 120 ml  Output 200 ml  Net -80 ml   Filed Weights   04/04/19 1620 04/05/19 2000  Weight: 62.6 kg 57.2 kg   Weight change:  Body mass index is 20.35 kg/m.   Physical Exam: General exam: Thin elderly African-American female.  Gradually waking up  skin: No  rashes, lesions or ulcers. HEENT: Dry mouth, atraumatic, Lungs: Clear to auscultation bilaterally CVS: Regular rate and rhythm, no murmur GI/Abd soft, nontender, nondistended, bowel sounds present CNS: Mental status gradually improving.  Able to tell me that she is in the hospital Psychiatry: Unable to examine Extremities: No pedal edema, no calf tenderness  Data Review: I have personally reviewed the laboratory data and studies available.  Recent Labs  Lab 04/04/19 1646 04/05/19 0435 04/08/19 0752  WBC 6.7 8.5 6.8  NEUTROABS 4.1  --  4.4  HGB 13.2 12.9 12.4  HCT 41.2 40.3 38.4  MCV 99.5 98.3 98.5  PLT 261 261 271   Recent Labs  Lab 04/04/19 1646 04/05/19 0435 04/08/19 0752  NA 144 143 145  K 3.4* 3.4* 3.5  CL 108 107 112*  CO2 28 25 24   GLUCOSE 109*  144* 124*  BUN 32* 21 32*  CREATININE 0.89 0.64 0.79  CALCIUM 9.5 9.4 9.7    Signed, Terrilee Croak, MD [facility] Pager: [phone] (Secure Chat preferred). 04/08/2019

## 2019-04-08 NOTE — Progress Notes (Signed)
Palliative care progress note  Reason for visit: Goals of care in light of intracranial hemorrhage  I saw and examined Jocelyn Mccann. She is more interactive but still nonverbal, does not open eyes, and cannot participate in conversation.   I met today with her son, Jocelyn Mccann, and her daughter, Jocelyn Mccann.  I introduced palliative care as specialized medical care for people living with serious illness. It focuses on providing relief from the symptoms and stress of a serious illness. The goal is to improve quality of life for both the patient and the family.  We discussed clinical course as well as wishes moving forward in regard to care plan this hospitalization.  Reviewed imaging of bleed per family request.  Concepts specific to code status discussed.  We discussed difference between a aggressive medical intervention path and a palliative, comfort focused care path.  Values and goals of care important to patient and family were attempted to be elicited.  Her children report that her family and her faith are the most important things to her.  We discussed that in light of her multiple chronic medical problems that have worsened with this acute decline from bleed, care should be focused on interventions that are likely to allow her to achieve goal of getting spending time with family and in her faith. I discussed with family regarding heroic interventions at the end-of-life and they agree this would not be in line with prior expressed wishes for a natural death or be likely to lead to getting well enough to go back home. They were in agreement with changing CODE STATUS to DO NOT RESUSCITATE.  - DNR/DNI - Continue current interventions.  Family requesting repeat CT scan to reassess bleeding.  I will pass request on to Dr. Pietro Cassis.  Total time: 55 minutes Greater than 50%  of this time was spent counseling and coordinating care related to the above assessment and plan.  Micheline Rough, MD [place] Team [phone]

## 2019-04-09 ENCOUNTER — Other Ambulatory Visit: Payer: Self-pay | Admitting: *Deleted

## 2019-04-09 ENCOUNTER — Inpatient Hospital Stay (HOSPITAL_COMMUNITY): Payer: Medicare HMO

## 2019-04-09 ENCOUNTER — Encounter (HOSPITAL_COMMUNITY): Payer: Self-pay | Admitting: Family Medicine

## 2019-04-09 LAB — GLUCOSE, CAPILLARY
Glucose-Capillary: 106 mg/dL — ABNORMAL HIGH (ref 70–99)
Glucose-Capillary: 109 mg/dL — ABNORMAL HIGH (ref 70–99)
Glucose-Capillary: 115 mg/dL — ABNORMAL HIGH (ref 70–99)
Glucose-Capillary: 120 mg/dL — ABNORMAL HIGH (ref 70–99)
Glucose-Capillary: 128 mg/dL — ABNORMAL HIGH (ref 70–99)
Glucose-Capillary: 131 mg/dL — ABNORMAL HIGH (ref 70–99)

## 2019-04-09 MED ORDER — CEFAZOLIN SODIUM-DEXTROSE 1-4 GM/50ML-% IV SOLN
1.0000 g | Freq: Two times a day (BID) | INTRAVENOUS | Status: AC
  Administered 2019-04-10 (×2): 1 g via INTRAVENOUS
  Filled 2019-04-09 (×3): qty 50

## 2019-04-09 MED ORDER — NICOTINE 14 MG/24HR TD PT24: 14.0000 mg | MEDICATED_PATCH | Freq: Every day | TRANSDERMAL | Status: DC

## 2019-04-09 MED ORDER — BISACODYL 10 MG RE SUPP
10.0000 mg | Freq: Every day | RECTAL | Status: DC | PRN
  Administered 2019-04-09: 10 mg via RECTAL
  Filled 2019-04-09: qty 1

## 2019-04-09 MED ORDER — LIP MEDEX EX OINT
TOPICAL_OINTMENT | CUTANEOUS | Status: DC | PRN
  Filled 2019-04-09: qty 7

## 2019-04-09 NOTE — Progress Notes (Signed)
Daily Progress Note   Patient Name: Jocelyn Mccann       Date: 04/09/2019 DOB: 07/26/1928  Age: 86 y.o. MRN#: [id number] Attending Physician: Terrilee Croak, MD Primary Care Physician: [name], MD Admit Date: 04/04/2019  Reason for Consultation/Follow-up: Establishing goals of care  Subjective: I saw and examined Jocelyn Mccann today.  She attempted to open eyes but was otherwise not interactive today.  I called and spoke with her daughter via phone.  We reviewed CT scan (no significant change per my interpretation) and discussed unknown of her course moving forward with high likelihood that she may continue to decline.  Her daughter reports understanding and stated that she is not at a point where family wants to make and changes to care plan, but she was interested in talking to someone from residential hospice as long as understanding is she is not committing to transition to comfort care by exploring options.  Length of Stay: 5  Current Medications: Scheduled Meds:  . chlorhexidine  15 mL Mouth Rinse BID  . Chlorhexidine Gluconate Cloth  6 each Topical Daily  . donepezil  5 mg Oral QHS  . hydrALAZINE  10 mg Intravenous Q6H  . mouth rinse  15 mL Mouth Rinse q12n4p  . QUEtiapine  12.5 mg Oral QHS  . sertraline  25 mg Oral Daily    Continuous Infusions: . [START ON 04/10/2019]  ceFAZolin (ANCEF) IV    . levETIRAcetam Stopped (04/09/19 1950)    PRN Meds: acetaminophen, bisacodyl, labetalol, lip balm  Physical Exam         General: Does not open eyes, nonverbal HEENT: No bruits, no goiter, no JVD Heart: Tachycardic. No murmur appreciated. Lungs: Good air movement, clear Abdomen: Soft, nontender, nondistended, positive bowel sounds.  Ext: No significant edema Skin: Warm  and dry  Vital Signs: BP (!) 158/78 (BP Location: Right Arm)   Pulse (!) 110   Temp 98.3 F (36.8 C) (Axillary)   Resp 18   Ht 5\' 6"  (1.676 m)   Wt 57.2 kg   SpO2 100%   BMI 20.35 kg/m  SpO2: SpO2: 100 % O2 Device: O2 Device: Room Air O2 Flow Rate: O2 Flow Rate (L/min): 2 L/min  Intake/output summary:   Intake/Output Summary (Last 24 hours) at 04/09/2019 2234 Last data filed at 04/09/2019  1851 Gross per 24 hour  Intake 1000 ml  Output 350 ml  Net 650 ml   LBM: Last BM Date: (none documented. Suppository given 3/29) Baseline Weight: Weight: 62.6 kg Most recent weight: Weight: 57.2 kg       Palliative Assessment/Data:      Patient Active Problem List   Diagnosis Date Noted  . Intracranial hemorrhage ([place]) 04/04/2019  . Klebsiella cystitis 02/17/2019  . Cerebrovascular accident (CVA) of right thalamus ([place]) 02/17/2019  . Acute cerebrovascular accident (CVA) of cerebellum ([place]) 02/17/2019  . HCAP (healthcare-associated pneumonia) 02/09/2019  . Sepsis ([place]) 02/09/2019  . Pressure injury of skin 02/07/2019  . Hypoglycemia without diagnosis of diabetes mellitus 02/06/2019  . Seizures ([place]) 02/06/2019  . Pancytopenia ([place]) 02/06/2019  . Palliative care by specialist   . Goals of care, counseling/discussion   . Acute encephalopathy   . Dysphagia   . SIRS (systemic inflammatory response syndrome) ([place]) 02/01/2019  . Syncope 12/05/2018  . SDH (subdural hematoma) ([place]) 08/21/2018  . Malignant neoplasm of upper-outer quadrant of right breast in female, estrogen receptor positive ([place]) 12/20/2017  . Breast cancer ([place])   . Osteoarthritis of knee 06/01/2017  . Bilateral knee pain 03/31/2017  . Urge incontinence of urine 03/31/2017  . Generalized anxiety disorder 03/31/2017  . Osteoporosis without current pathological fracture 03/31/2017  . Alzheimer disease ([place]) 03/31/2017    Palliative Care Assessment & Plan   Patient Profile: 86 year old female with past medical  history of Alzheimer's disease, CVA, dysphagia, subdural hematoma with seizures, right breast cancer status post lumpectomy on treatment with anastrozole who presented to the ED with decreased responsiveness and was found to have intraventricular hemorrhage into both ventricles that extends the third ventricle with mild hydrocephalus and midline shift.  Neurosurgery consulted and not a good candidate for external ventricular drain placement.  Palliative consulted for goals of care.  Recommendations/Plan:  DNR/DNI  Continue current care for now.  Daughter is not yet at a point to transition to comfort care, but she did note this evening that she would like more information of residential hospice (cost of [employer], etc).  Plan to reach out to liaison from [place] to see if they would be able to reach out to answer her questions.  Code Status:    Code Status Orders  (From admission, onward)         Start     Ordered   04/08/19 1851  Do not attempt resuscitation (DNR)  Continuous    Question Answer Comment  In the event of cardiac or respiratory ARREST Do not call a "code blue"   In the event of cardiac or respiratory ARREST Do not perform Intubation, CPR, defibrillation or ACLS   In the event of cardiac or respiratory ARREST Use medication by any route, position, wound care, and other measures to relive pain and suffering. May use oxygen, suction and manual treatment of airway obstruction as needed for comfort.      04/08/19 1850        Code Status History    Date Active Date Inactive Code Status Order ID Comments User Context   04/04/2019 1952 04/08/2019 1850 Full Code [id number]  Orene Desanctis, DO ED   02/02/2019 0053 02/13/2019 1926 Full Code [id number]  Bennie Pierini, MD ED   02/01/2019 1809 02/02/2019 0053 Full Code [id number]  Blain Pais, MD ED   08/21/2018 1523 08/26/2018 1818 Full Code [id number]  Karmen Bongo, MD ED   Advance Care Planning  Activity    Advance  Directive Documentation     Most Recent Value  Type of Advance Directive  Healthcare Power of Attorney  Pre-existing out of facility DNR order (yellow form or pink MOST form)  --  "MOST" Form in Place?  --       Prognosis:   Poor  Discharge Planning:  To Be Determined  Care plan was discussed with daughter  Thank you for allowing the Palliative Medicine Team to assist in the care of this patient.   Time In: 1820 Time Out: 1900 Total Time 30 Prolonged Time Billed No      Greater than 50%  of this time was spent counseling and coordinating care related to the above assessment and plan.  Micheline Rough, MD  Please contact Palliative Medicine Team phone at [phone] for questions and concerns.

## 2019-04-09 NOTE — IPOC Note (Signed)
Low to no urine output this shift - with fluids  Given external catheter - output 377ml

## 2019-04-09 NOTE — Patient Outreach (Signed)
[facility]) Care Management  04/09/2019  NEPHATERIA WIECEK 1928-12-22 [id number]   Care Coordination    Received Referral : 04/06/19 Referral source: Notification of patient discharge from [facility].  Insurance: [employer]    Subjective Received notification of patient discharge from [facility]  04/04/19 as  patient was readmitted to [facility] Will send message to [facility] of patient readmission,further follow up will be determined by patient  disposition plans.    Joylene Draft, RN, BSN  [place] Management Coordinator  [phone]- Mobile [phone]- Toll Free Main Office

## 2019-04-09 NOTE — Plan of Care (Signed)
Patient resting in bed, grimaces when being talked to or touched. Skin intact and will continue to turn. Will continue to monitor.  Problem: Education: Goal: Knowledge of disease or condition will improve Outcome: Progressing Goal: Knowledge of secondary prevention will improve Outcome: Progressing Goal: Knowledge of patient specific risk factors addressed and post discharge goals established will improve Outcome: Progressing Goal: Individualized Educational Video(s) Outcome: Progressing   Problem: Coping: Goal: Will verbalize positive feelings about self Outcome: Progressing Goal: Will identify appropriate support needs Outcome: Progressing   Problem: Nutrition: Goal: Risk of aspiration will decrease Outcome: Progressing   Problem: Intracerebral Hemorrhage Tissue Perfusion: Goal: Complications of Intracerebral Hemorrhage will be minimized Outcome: Progressing   Problem: Education: Goal: Knowledge of General Education information will improve Description: Including pain rating scale, medication(s)/side effects and non-pharmacologic comfort measures Outcome: Progressing   Problem: Health Behavior/Discharge Planning: Goal: Ability to manage health-related needs will improve Outcome: Progressing   Problem: Clinical Measurements: Goal: Ability to maintain clinical measurements within normal limits will improve Outcome: Progressing Goal: Will remain free from infection Outcome: Progressing Goal: Diagnostic test results will improve Outcome: Progressing Goal: Respiratory complications will improve Outcome: Progressing Goal: Cardiovascular complication will be avoided Outcome: Progressing   Problem: Activity: Goal: Risk for activity intolerance will decrease Outcome: Progressing   Problem: Nutrition: Goal: Adequate nutrition will be maintained Outcome: Progressing   Problem: Coping: Goal: Level of anxiety will decrease Outcome: Progressing   Problem:  Elimination: Goal: Will not experience complications related to bowel motility Outcome: Progressing Goal: Will not experience complications related to urinary retention Outcome: Progressing   Problem: Pain Managment: Goal: General experience of comfort will improve Outcome: Progressing   Problem: Safety: Goal: Ability to remain free from injury will improve Outcome: Progressing   Problem: Skin Integrity: Goal: Risk for impaired skin integrity will decrease Outcome: Progressing

## 2019-04-09 NOTE — Progress Notes (Signed)
PROGRESS NOTE  Jocelyn Mccann  DOB: 02-Dec-1928  PCP: [name], MD [id number]  DOA: 04/04/2019 Admitted From: [facility]  LOS: 5 days   Chief Complaint  Patient presents with  . Altered Mental Status   Brief narrative: Jocelyn Mccann is a 86 y.o. female with PMH significant for for Alzheimer's disease, CVA, dysphagia, history of subdural hematoma with seizures, history of right breast cancer s/p lumpectomy on treatment with anastrozole.  Patient was to the ED with concerns of decreased responsiveness. Per chart, patient has been progressively declining physically and mentally over the last year. Back in January 2021 patient had an unwitnessed fall and was found to be obtunded and had seizures.    She was hospitalized, intubated, managed for septic shock due to UTI, had Klebsiella pneumonia.  She was discharged to [facility] from rehab. Patient reportedly has had several falls in the last 2 months. Twice she was found at the side of her bed and sometime in March she fell out of wheelchair and hit her head. Daughter reports that she was told by the facility that patient has had decreased appetite, decreased responsiveness for last 2 days and was hence sent to the ED.  In the ED, patient was hypertensive but otherwise stable on room air. She was only responsive to painful stimuli.  She was hypertensive but otherwise stable on room air.   CT head showed intraventricular hemorrhage into both lateral ventricles and extends to the third ventricle with mild hydrocephalus.   Neurosurgery was consulted by ED physician.  Per neurosurgery, the risks of on outweigh the benefits of an external ventricular drain placement.   Patient was admitted to medicine service.  Subjective: Patient was seen and examined this morning.   Tries to open eyes on touch stimulus.  Unable to follow command  Assessment/Plan: Intraventricular hemorrhage in the setting of hx of CVA and frequent falls -Per  radiology, patient most likely bled from a remote thalamic infarct than on trauma. -Per neurosurgery, she is not a candidate for surgical intervention based on her age, comorbidities. -Given the extensive nature of her bleed, she is likely to develop progressive respiratory and cardiac compromise. -I had a long phone conversation with patient's daughter and son on 3/25 and 3/26.  Patient probably will continue to decline based the natural progression of her hemorrhage and extremely unlikely to have any meaningful recovery. I discussed the extreme low likelihood of her being able to come off of mechanical ventilation. I mentioned to the family that although we cannot have an exact determination of patient's expected death, she is unlikely to survive more than next few days.  -Family understands her poor prognosis but would still like to keep patient full code till the son is able to visit her. -Palliative care consultation appreciated.  DNR now. -At family's request, CT head was repeated today.  It did not show any change in the extensive intraventricular clot.   Acute encephalopathy -Primarily due to intracranial hemorrhage.  Also contributed by UTI. -Patient was deeply comatose for first 2 days.  She is gradually waking up, today she is able to tell me that she is in the hospital. -Mental status may improve somewhat and may keep fluctuating but I do not think not sure if patient will have her meaningful recovery of cognition.  UTI due to Klebsiella pneumoniae -Patient had one episode of fever 101.3 on 3/26. -Urine culture is showing more than 100,000 CFU per mL of Klebsiella pneumoniae.   -  Currently IV antibiotics, switched to IV Ancef today.  Elevated blood pressure Sinus tachycardia -Patient has history of hypertension per chart.  I do not see any blood pressure medicine in her list of home medicines. -Blood pressure in the hospital has been running high probably to maintain cerebral  perfusion in the setting of intracranial hypertension due to intracranial bleeding.  Heart rate is also mostly elevated to more than 110. -Because her blood pressure has been consistently elevated, today I started her on a scheduled hydralazine IV every 6 hours and as needed labetalol every 6 hours.    Seizure -IV Keppra  Dementia -Meds on hold  Hx of breast cancer -Meds on hold  Code Status:  Full code for now DVT prophylaxis:  SCDs Diet: Dysphagia 1 diet per speech therapy.   Mobility: Unlikely to improve or participate with physical therapy on his mental status significantly improves. Family Communication:  See above Discharge plan:  Anticipated date and disposition: Has not had a meaningful recovery of mental status yet.  Patient is hospice appropriate but family has not made that choice yet.  Unable to discharge out of hospital ED at this time. Consultants: Neurosurgery  Antimicrobials: Anti-infectives (From admission, onward)   Start     Dose/Rate Route Frequency Ordered Stop   04/10/19 1000  ceFAZolin (ANCEF) IVPB 1 g/50 mL premix     1 g 100 mL/hr over 30 Minutes Intravenous Every 12 hours 04/09/19 1326     04/06/19 0900  cefTRIAXone (ROCEPHIN) 1 g in sodium chloride 0.9 % 100 mL IVPB  Status:  Discontinued     1 g 200 mL/hr over 30 Minutes Intravenous Every 24 hours 04/06/19 0802 04/09/19 1326        Code Status: DNR   Diet Order            DIET - DYS 1 Room service appropriate? No; Fluid consistency: Thin  Diet effective now              Infusions:  . [START ON 04/10/2019]  ceFAZolin (ANCEF) IV    . levETIRAcetam Stopped (04/09/19 0806)    Scheduled Meds: . chlorhexidine  15 mL Mouth Rinse BID  . Chlorhexidine Gluconate Cloth  6 each Topical Daily  . donepezil  5 mg Oral QHS  . hydrALAZINE  10 mg Intravenous Q6H  . mouth rinse  15 mL Mouth Rinse q12n4p  . QUEtiapine  12.5 mg Oral QHS  . sertraline  25 mg Oral Daily    PRN meds:     Objective: Vitals:   04/09/19 1400 04/09/19 1500  BP: (!) 153/75 (!) 146/67  Pulse: (!) 118 (!) 116  Resp: 16 18  Temp:    SpO2: 99% 98%    Intake/Output Summary (Last 24 hours) at 04/09/2019 1531 Last data filed at 04/09/2019 1300 Gross per 24 hour  Intake 1337.06 ml  Output 350 ml  Net 987.06 ml   Filed Weights   04/04/19 1620 04/05/19 2000  Weight: 62.6 kg 57.2 kg   Weight change:  Body mass index is 20.35 kg/m.   Physical Exam: General exam: Thin elderly African-American female.  Gradually waking up  skin: No rashes, lesions or ulcers. HEENT: Dry mouth, atraumatic, Lungs: Clear to auscultation bilaterally, no crackles CVS: Regular tachycardia, no murmur GI/Abd soft, nontender, nondistended, bowel sounds present CNS: Mental status gradually improving.  Able to tell me that she is in the hospital Psychiatry: Unable to examine Extremities: No pedal edema, no calf tenderness  Data Review: I have personally reviewed the laboratory data and studies available.  Recent Labs  Lab 04/04/19 1646 04/05/19 0435 04/08/19 0752  WBC 6.7 8.5 6.8  NEUTROABS 4.1  --  4.4  HGB 13.2 12.9 12.4  HCT 41.2 40.3 38.4  MCV 99.5 98.3 98.5  PLT 261 261 271   Recent Labs  Lab 04/04/19 1646 04/05/19 0435 04/08/19 0752  NA 144 143 145  K 3.4* 3.4* 3.5  CL 108 107 112*  CO2 28 25 24   GLUCOSE 109* 144* 124*  BUN 32* 21 32*  CREATININE 0.89 0.64 0.79  CALCIUM 9.5 9.4 9.7    Signed, Terrilee Croak, MD [facility] Pager: [phone] (Secure Chat preferred). 04/09/2019

## 2019-04-10 ENCOUNTER — Encounter: Payer: Self-pay | Admitting: Internal Medicine

## 2019-04-10 LAB — GLUCOSE, CAPILLARY
Glucose-Capillary: 103 mg/dL — ABNORMAL HIGH (ref 70–99)
Glucose-Capillary: 127 mg/dL — ABNORMAL HIGH (ref 70–99)
Glucose-Capillary: 133 mg/dL — ABNORMAL HIGH (ref 70–99)
Glucose-Capillary: 141 mg/dL — ABNORMAL HIGH (ref 70–99)

## 2019-04-10 MED ORDER — SODIUM CHLORIDE 0.9 % IV SOLN
INTRAVENOUS | Status: DC | PRN
  Administered 2019-04-10: 250 mL via INTRAVENOUS

## 2019-04-10 NOTE — Progress Notes (Signed)
PROGRESS NOTE  Jocelyn Mccann  DOB: 12-07-1928  PCP: [name], MD [id number]  DOA: 04/04/2019 Admitted From: [facility]  LOS: 6 days   Chief Complaint  Patient presents with  . Altered Mental Status   Brief narrative: Jocelyn Mccann is a 86 y.o. female with PMH significant for for Alzheimer's disease, CVA, dysphagia, history of subdural hematoma with seizures, history of right breast cancer s/p lumpectomy on treatment with anastrozole.  Patient was to the ED with concerns of decreased responsiveness. Per chart, patient has been progressively declining physically and mentally over the last year. Back in January 2021 patient had an unwitnessed fall and was found to be obtunded and had seizures.    She was hospitalized, intubated, managed for septic shock due to UTI, had Klebsiella pneumonia.  She was discharged to [facility] from rehab. Patient reportedly has had several falls in the last 2 months. Twice she was found at the side of her bed and sometime in March she fell out of wheelchair and hit her head. Daughter reports that she was told by the facility that patient has had decreased appetite, decreased responsiveness for last 2 days and was hence sent to the ED.  In the ED, patient was hypertensive but otherwise stable on room air. She was only responsive to painful stimuli.  She was hypertensive but otherwise stable on room air.   CT head showed intraventricular hemorrhage into both lateral ventricles and extends to the third ventricle with mild hydrocephalus.   Neurosurgery was consulted by ED physician.  Per neurosurgery, the risks of on outweigh the benefits of an external ventricular drain placement.   Patient was admitted to medicine service.  Subjective: Patient was seen and examined this morning.   Opens her eyes on touch stimulus.  Unable to answer questions or follow motor commands.  Per RN, she has been seen is spontaneously moving her upper extremities but not  the lower extremities.  Assessment/Plan: Intraventricular hemorrhage in the setting of hx of CVA and frequent falls -Per radiology, patient most likely bled from a remote thalamic infarct than on trauma. -Per neurosurgery, she is not a candidate for surgical intervention based on her age, comorbidities. -Given the extensive nature of her bleed, she is likely to develop progressive respiratory and cardiac compromise. -In last 1 to 2 days, patient has had somewhat improvement in her mental status and is at least able to open eyes but sees unlikely to have a meaningful recovery.   -At family's request, CT head was repeated on 3/29.  It did not show any change in the extensive intraventricular clot.  -I had a long phone conversation with patient's daughter and son on 3/25 and 3/26.  Palliative care team met with family on 3/28.  Family still is hopeful of a meaningful recovery which is very unlikely.  Patient is at least a DNR now. -Per palliative care note from 3/29, family would like to have a conversation with hospice liaison to get an idea about the services and cost.  This morning, I discussed with case manager RN [name] to start the process.  Acute encephalopathy -Primarily due to intracranial hemorrhage.  Also contributed by UTI. -Patient was deeply comatose for first 2 days.  There is some limited improvement in her mental status and is now able to open eyes on command.  She can also swallow oral intake. -Mental status may improve somewhat and may keep fluctuating but I do not think patient will have a  meaningful cognitive recovery.  UTI due to Klebsiella pneumoniae -Patient had one episode of fever 101.3 on 3/26. -Urine culture is showing more than 100,000 CFU per mL of Klebsiella pneumoniae.   -Currently IV antibiotics, continue IV Ancef.  Elevated blood pressure Sinus tachycardia -Patient has history of hypertension per chart.  I do not see any blood pressure medicine in her list of  home medicines. -In the hospital, patient is running tachycardic and hypertensive.  I have her on scheduled IV hydralazine every 6 hours as aggressive as needed labetalol every 6 hours.    Seizure -IV Keppra.   Dementia -Meds on hold  Hx of breast cancer -Meds on hold  Code Status:  Full code for now DVT prophylaxis:  SCDs Diet: Dysphagia 1 diet per speech therapy.   Mobility: She has had some improvement in mental status.  I will order PT eval today. Family Communication:  See above Discharge plan:  Anticipated date and disposition: Has not had a meaningful recovery of mental status yet.  Patient is hospice appropriate but family has not made that choice yet.  Unable to discharge out of hospital ED at this time. Consultants: Neurosurgery  Antimicrobials: Anti-infectives (From admission, onward)   Start     Dose/Rate Route Frequency Ordered Stop   04/10/19 1000  ceFAZolin (ANCEF) IVPB 1 g/50 mL premix     1 g 100 mL/hr over 30 Minutes Intravenous Every 12 hours 04/09/19 1326     04/06/19 0900  cefTRIAXone (ROCEPHIN) 1 g in sodium chloride 0.9 % 100 mL IVPB  Status:  Discontinued     1 g 200 mL/hr over 30 Minutes Intravenous Every 24 hours 04/06/19 0802 04/09/19 1326        Code Status: DNR   Diet Order            DIET - DYS 1 Room service appropriate? No; Fluid consistency: Thin  Diet effective now              Infusions:  .  ceFAZolin (ANCEF) IV 1 g (04/10/19 0955)  . levETIRAcetam 500 mg (04/10/19 0927)    Scheduled Meds: . chlorhexidine  15 mL Mouth Rinse BID  . Chlorhexidine Gluconate Cloth  6 each Topical Daily  . donepezil  5 mg Oral QHS  . hydrALAZINE  10 mg Intravenous Q6H  . mouth rinse  15 mL Mouth Rinse q12n4p  . QUEtiapine  12.5 mg Oral QHS  . sertraline  25 mg Oral Daily    PRN meds:    Objective: Vitals:   04/10/19 0600 04/10/19 0659  BP: (!) 143/66   Pulse: (!) 108   Resp: 16   Temp:  98.2 F (36.8 C)  SpO2:       Intake/Output Summary (Last 24 hours) at 04/10/2019 0959 Last data filed at 04/10/2019 0427 Gross per 24 hour  Intake 1000 ml  Output 250 ml  Net 750 ml   Filed Weights   04/04/19 1620 04/05/19 2000  Weight: 62.6 kg 57.2 kg   Weight change:  Body mass index is 20.35 kg/m.   Physical Exam: General exam: Thin elderly African-American female.  Gradually waking up.  Not in physical distress. skin: No rashes, lesions or ulcers. HEENT: Dry mouth, atraumatic, Lungs: Clear to auscultation bilaterally, no crackles CVS: Regular tachycardia, no murmur GI/Abd soft, nontender, nondistended, bowel sounds present CNS: Opens eyes on verbal command.  Unable to follow oral motor commands Psychiatry: Unable to examine Extremities: No pedal edema, no calf tenderness  Data Review: I have personally reviewed the laboratory data and studies available.  Recent Labs  Lab 04/04/19 1646 04/05/19 0435 04/08/19 0752  WBC 6.7 8.5 6.8  NEUTROABS 4.1  --  4.4  HGB 13.2 12.9 12.4  HCT 41.2 40.3 38.4  MCV 99.5 98.3 98.5  PLT 261 261 271   Recent Labs  Lab 04/04/19 1646 04/05/19 0435 04/08/19 0752  NA 144 143 145  K 3.4* 3.4* 3.5  CL 108 107 112*  CO2 '28 25 24  '$ GLUCOSE 109* 144* 124*  BUN 32* 21 32*  CREATININE 0.89 0.64 0.79  CALCIUM 9.5 9.4 9.7    Signed, Terrilee Croak, MD [facility] Pager: [phone] (Secure Chat preferred). 04/10/2019

## 2019-04-10 NOTE — Plan of Care (Signed)
Patient only opens eyes when her name is called a couple times and when being turned. Patient did not eat much breakfast this am, 10% compared to 100% for yesterday's lunch and dinner. Will continue to offer oral fluids when awake enough.  Problem: Education: Goal: Knowledge of disease or condition will improve Outcome: Progressing Goal: Knowledge of secondary prevention will improve Outcome: Progressing Goal: Knowledge of patient specific risk factors addressed and post discharge goals established will improve Outcome: Progressing Goal: Individualized Educational Video(s) Outcome: Progressing   Problem: Coping: Goal: Will verbalize positive feelings about self Outcome: Progressing Goal: Will identify appropriate support needs Outcome: Progressing   Problem: Nutrition: Goal: Risk of aspiration will decrease Outcome: Progressing   Problem: Intracerebral Hemorrhage Tissue Perfusion: Goal: Complications of Intracerebral Hemorrhage will be minimized Outcome: Progressing   Problem: Education: Goal: Knowledge of General Education information will improve Description: Including pain rating scale, medication(s)/side effects and non-pharmacologic comfort measures Outcome: Progressing   Problem: Health Behavior/Discharge Planning: Goal: Ability to manage health-related needs will improve Outcome: Progressing   Problem: Clinical Measurements: Goal: Ability to maintain clinical measurements within normal limits will improve Outcome: Progressing Goal: Will remain free from infection Outcome: Progressing Goal: Diagnostic test results will improve Outcome: Progressing Goal: Respiratory complications will improve Outcome: Progressing Goal: Cardiovascular complication will be avoided Outcome: Progressing   Problem: Activity: Goal: Risk for activity intolerance will decrease Outcome: Progressing   Problem: Nutrition: Goal: Adequate nutrition will be maintained Outcome: Progressing   Problem: Coping: Goal: Level of anxiety will decrease Outcome: Progressing   Problem: Elimination: Goal: Will not experience complications related to bowel motility Outcome: Progressing Goal: Will not experience complications related to urinary retention Outcome: Progressing   Problem: Pain Managment: Goal: General experience of comfort will improve Outcome: Progressing   Problem: Safety: Goal: Ability to remain free from injury will improve Outcome: Progressing   Problem: Skin Integrity: Goal: Risk for impaired skin integrity will decrease Outcome: Progressing

## 2019-04-10 NOTE — TOC Progression Note (Signed)
Transition of Care [facility]) - Progression Note    Patient Details  Name: Jocelyn Mccann MRN: [id number] Date of Birth: 1928/08/22  Transition of Care [facility]) CM/SW Contact  Leeroy Cha, RN Phone Number: 04/10/2019, 10:02 AM  Clinical Narrative:    Edwena Felty with [facility]/message left to please call the daughter and answer questions about [facility].        Expected Discharge Plan and Services                                                 Social Determinants of Health (SDOH) Interventions    Readmission Risk Interventions No flowsheet data found.

## 2019-04-10 NOTE — Progress Notes (Signed)
  Speech Language Pathology Treatment: Dysphagia  Patient Details Name: Jocelyn Mccann MRN: [id number] DOB: 1928/10/23 Today's Date: 04/10/2019 Time: [id number] SLP Time Calculation (min) (ACUTE ONLY): 10 min  Assessment / Plan / Recommendation Clinical Impression  Pt asleep upon SLP arrival but did awaken to SLP verbal/tactile stimulation.  She does not follow directions nor open her eyes consistently but will open mouth for po when ready.  Delayed swallow initiation noted - suspect this is oral more than pharyngeal, no indication of aspiration c/b coughing or throat clearing with intake.  Delay in swallow worse with puree than liquids and her delay will increase her aspiration and malnutrition risk as pt is a laborious feeder.  RN reports it took 30 minutes for pt to eat dinner last evening.    Recommend to maximize liquid nutrition for nutritional/swallow efficiency for maximal caloric intake.  Do not advise diet beyond puree at this time.    Will follow up for family education re: dysphagia/dementia/nutrition/aspiration risk etc.  Advised RN have pt help hand over hand if able to improve her propriception and readines for specific bolus intake.    HPI HPI: Jocelyn Mccann is an 86 year old female with past medical history of Alzheimer's disease, CVA, dysphagia, subdural hematoma with seizures, right breast cancer status post lumpectomy on treatment with anastrozole who presented to the ED with decreased responsiveness and was found to have intraventricular hemorrhage into both ventricles that extends the third ventricle with mild hydrocephalus and midline shift.  Neurosurgery consulted and not a good candidate for external ventricular drain placement.  Pt is s/p swallow eval with dys1/thin via tsp advised.  She and family have seen palliative care and code status was changed to DNR but per notes, family desires full treatment.      SLP Plan  Continue with current plan of care        Recommendations  Diet recommendations: Dysphagia 1 (puree);Thin liquid Liquids provided via: Cup;Straw;Teaspoon Medication Administration: Crushed with puree Compensations: Minimize environmental distractions;Slow rate;Small sips/bites(assure pt swallows before giving more, hand over hand assist with meals)                Oral Care Recommendations: Staff/trained caregiver to provide oral care;Oral care BID Follow up Recommendations: Skilled Nursing facility;24 hour supervision/assistance SLP Visit Diagnosis: Dysphagia, oral phase (R13.11);Dysphagia, unspecified (R13.10) Plan: Continue with current plan of care       GO                Jocelyn Mccann 04/10/2019, 3:55 PM  Jocelyn Lime, MS [place] [facility] [phone]

## 2019-04-10 NOTE — Progress Notes (Signed)
[facility]) Hospital Liaison note.    Received request from [place], RN for family interest in [place] and information on facility and costs.  Spoke with patient's daughter, [name] by phone to explain services and Hospice/Medicare benefit.  Daughter wishes to speak with physician again regarding her mother's care before making a decision.  Liaison gave contact information for follow up or any additional questions.   A Please do not hesitate to call with questions.    Thank you,   Farrel Gordon, RN, CCM      [place] (listed on [place] under Hospice/[facility])    [phone]

## 2019-04-11 DIAGNOSIS — G301 Alzheimer's disease with late onset: Secondary | ICD-10-CM

## 2019-04-11 DIAGNOSIS — R531 Weakness: Secondary | ICD-10-CM

## 2019-04-11 DIAGNOSIS — Z7189 Other specified counseling: Secondary | ICD-10-CM

## 2019-04-11 DIAGNOSIS — Z515 Encounter for palliative care: Secondary | ICD-10-CM

## 2019-04-11 DIAGNOSIS — I629 Nontraumatic intracranial hemorrhage, unspecified: Secondary | ICD-10-CM

## 2019-04-11 LAB — GLUCOSE, CAPILLARY
Glucose-Capillary: 120 mg/dL — ABNORMAL HIGH (ref 70–99)
Glucose-Capillary: 142 mg/dL — ABNORMAL HIGH (ref 70–99)
Glucose-Capillary: 131 mg/dL — ABNORMAL HIGH (ref 70–99)

## 2019-04-11 LAB — CBC WITH DIFFERENTIAL/PLATELET
Abs Immature Granulocytes: 0.02 10*3/uL (ref 0.00–0.07)
Basophils Absolute: 0 10*3/uL (ref 0.0–0.1)
Basophils Relative: 1 %
Eosinophils Absolute: 0.1 10*3/uL (ref 0.0–0.5)
Eosinophils Relative: 2 %
HCT: 38.9 % (ref 36.0–46.0)
Hemoglobin: 12.6 g/dL (ref 12.0–15.0)
Immature Granulocytes: 0 %
Lymphocytes Relative: 25 %
Lymphs Abs: 1.7 10*3/uL (ref 0.7–4.0)
MCH: 31.5 pg (ref 26.0–34.0)
MCHC: 32.4 g/dL (ref 30.0–36.0)
MCV: 97.3 fL (ref 80.0–100.0)
Monocytes Absolute: 0.8 10*3/uL (ref 0.1–1.0)
Monocytes Relative: 12 %
Neutro Abs: 4 10*3/uL (ref 1.7–7.7)
Neutrophils Relative %: 60 %
Platelets: 234 10*3/uL (ref 150–400)
RBC: 4 MIL/uL (ref 3.87–5.11)
RDW: 13.6 % (ref 11.5–15.5)
WBC: 6.6 10*3/uL (ref 4.0–10.5)
nRBC: 0 % (ref 0.0–0.2)

## 2019-04-11 LAB — BASIC METABOLIC PANEL
Anion gap: 6 (ref 5–15)
BUN: 17 mg/dL (ref 8–23)
CO2: 25 mmol/L (ref 22–32)
Calcium: 9.7 mg/dL (ref 8.9–10.3)
Chloride: 113 mmol/L — ABNORMAL HIGH (ref 98–111)
Creatinine, Ser: 0.59 mg/dL (ref 0.44–1.00)
GFR calc Af Amer: >60 mL/min (ref >60)
GFR calc non Af Amer: >60 mL/min (ref >60)
Glucose, Bld: 127 mg/dL — ABNORMAL HIGH (ref 70–99)
Potassium: 3.2 mmol/L — ABNORMAL LOW (ref 3.5–5.1)
Sodium: 144 mmol/L (ref 135–145)

## 2019-04-11 LAB — MAGNESIUM: Magnesium: 1.7 mg/dL (ref 1.7–2.4)

## 2019-04-11 LAB — PHOSPHORUS: Phosphorus: 2.4 mg/dL — ABNORMAL LOW (ref 2.5–4.6)

## 2019-04-11 MED ORDER — POTASSIUM CHLORIDE 10 MEQ/100ML IV SOLN: 10.0000 meq | INTRAVENOUS | Status: DC

## 2019-04-11 MED ORDER — POTASSIUM CHLORIDE CRYS ER 20 MEQ PO TBCR
40.0000 meq | EXTENDED_RELEASE_TABLET | Freq: Once | ORAL | Status: AC
  Administered 2019-04-11: 14:00:00 40 meq via ORAL
  Filled 2019-04-11: qty 2

## 2019-04-11 MED ORDER — MAGNESIUM SULFATE 2 GM/50ML IV SOLN
2.0000 g | Freq: Once | INTRAVENOUS | Status: AC
  Administered 2019-04-11: 2 g via INTRAVENOUS
  Filled 2019-04-11: qty 50

## 2019-04-11 NOTE — TOC Progression Note (Signed)
Transition of Care [facility]) - Progression Note    Patient Details  Name: Jocelyn Mccann MRN: [id number] Date of Birth: Mar 15, 1928  Transition of Care [facility]) CM/SW Contact  Purcell Mouton, RN Phone Number: 04/11/2019, 11:37 AM  Clinical Narrative:    Spoke with pt's daughter [name] concerning discharge plan. [place] selected [facility]. Referral given to in house rep Audrea Muscat, RN with [facility].         Expected Discharge Plan and Services                                                 Social Determinants of Health (SDOH) Interventions    Readmission Risk Interventions No flowsheet data found.

## 2019-04-11 NOTE — Progress Notes (Signed)
Pt is not tolerating IV potassium, MD is paged via secured chat and received verbal order to discontinue IV potassium and administer 1 dose of 40 meq PO. Orders carried out.

## 2019-04-11 NOTE — Progress Notes (Signed)
[facility]) Hospital Liaison note.     Received request from [place] for family interest in [facility]. [facility] is unable to offer a room today. [place] liaison left a VM message for family to return call. Hospital Liaison will follow up tomorrow or sooner if a room becomes available.     A Please do not hesitate to call with questions.     Thank you,    Farrel Gordon, RN, [facility]       [place] (listed on [facility] under [place])     [phone]

## 2019-04-11 NOTE — Progress Notes (Signed)
PROGRESS NOTE    Jocelyn Mccann  GYB:[id number] DOB: August 15, 1928 DOA: 04/04/2019 PCP: Jocelyn Duos, MD     Brief Narrative:  Jocelyn Fawley Howardis a 86 y.o[place] PMHx significant for forAlzheimer's disease, CVA, dysphagia, history of subdural hematoma with seizures, history of right breast cancer s/p lumpectomyon treatment with anastrozole.  Patient was to the ED with concerns of decreased responsiveness. Per chart, patient has been progressively declining physically and mentally over the last year. Back in January 2021 patient had an unwitnessed fall and was found to be obtunded and had seizures.   She was hospitalized, intubated, managed for septic shock due to UTI, had Klebsiella pneumonia.  She was discharged to [facility] from rehab. Patient reportedly has had several falls in the last 2 months. Twice she was found at the side of her bed and sometime in March she fell out of wheelchair and hit her head. Daughter reports that she was told by the facility that patient has had decreased appetite, decreased responsiveness for last 2 days and was hence sent to the ED.  In the ED, patient was hypertensive but otherwise stable on room air. She was only responsive to painful stimuli. She was hypertensive but otherwise stable on room air.  CT head showed intraventricular hemorrhage into both lateral ventricles and extends to the third ventricle with mild hydrocephalus.  Neurosurgery was consulted by ED physician.  Per neurosurgery, the risks of on outweigh the benefits of an external ventricular drain placement.   Patient was admitted to medicine service.   Subjective: A/O x0 patient mumbling incomprehensibly.  Does not follow commands.  Does not withdrawal to painful stimuli (does state ouch when he is pinched on his toes)    Assessment & Plan:   Principal Problem:   Intracranial hemorrhage (HCC) Active Problems:   Alzheimer disease ([place])   Breast cancer ([place])  Seizures ([place])   Intraventricular hemorrhage in the setting of hx of CVA and frequent falls -Per radiology, patient most likely bled from a remote thalamic infarct than on trauma. -Per neurosurgery, she is not a candidate for surgical intervention based on her age, comorbidities. -Given the extensive nature of her bleed, she is likely to develop progressiverespiratory and cardiac compromise. -In last 1 to 2 days, patient has had somewhat improvement in her mental status and is at least able to open eyes but sees unlikely to have a meaningful recovery.   -At family's request, CT head was repeated on 3/29.  It did not show any change in the extensive intraventricular clot.  -I had a long phone conversation with patient's daughter and son on 3/25 and 3/26.  Palliative care team met with family on 3/28.  Family still is hopeful of a meaningful recovery which is very unlikely.  Patient is at least a DNR now. -Per palliative care note from 3/29, family would like to have a conversation with hospice liaison to get an idea about the services and cost.  This morning, I discussed with case manager RN [name] to start the process.  Acute encephalopathy -Primarily due to intracranial hemorrhage.  Also contributed by UTI. -Patient was deeply comatose for first 2 days.  There is some limited improvement in her mental status and is now able to open eyes on command.  She can also swallow oral intake. -Mental status may improve somewhat and may keep fluctuating but I do not think patient will have a meaningful cognitive recovery.  UTI due to Klebsiella pneumoniae -Patient had one  episode of fever 101.3 on 3/26. -Urine culture is showing more than 100,000 CFU per mL of Klebsiella pneumoniae.   -Currently IV antibiotics, continue IV Ancef.  Elevated blood pressure Sinus tachycardia -Patient has history of hypertension per chart.  I do not see any blood pressure medicine in her list of home medicines. -In  the hospital, patient is running tachycardic and hypertensive.  I have her on scheduled IV hydralazine every 6 hours as aggressive as needed labetalol every 6 hours.    Seizure -IV Keppra.   Dementia -Meds on hold  Hx of breast cancer -Meds on hold  Hypokalemia -Potassium goal> 4 -Potassium PO 40 mEq  Hypomagnesmia -Magnesium goal> 2 -Magnesium IV 2 g   Goals of care -3/24 Neurosurgery;Called in regards to this patients head CT. According to the daughter she has had multiple falls over the last several months. She has been in a rehab facility since her last fall. It was reported that she fell out of her wheelchair sometime this month. CT head shows intraventricular hemorrhage into both lateral ventricles and extends into the 3rd vent with some mild hydrocephalus compared to her ct in January. I spoke with the daughter and had a lengthy discussion about aggressive care versus comfort care. I do think that the risks far outweigh the benefits of placing an EVD in this 86 year old patient with dementia and frequent falls. The daughter agrees. At this point, I would recommend changing her code status to DNR and hospitalists admit for comfort measures.   3/31 Palliative Care consult; TBD residential hospice placement   DVT prophylaxis: SCD (Hemorrhagic stroke) Code Status: DNR Family Communication: 3/31 Dr. Loistine Chance palliative care spoke with family  Disposition Plan:  1.  Where the patient is from 2.  Anticipated d/c place. 3.  Barriers to d/c awaiting family decision on hospice   Consultants:  Palliative care Neurosurgery NP Lonia Blood   Procedures/Significant Events:    I have personally reviewed and interpreted all radiology studies and my findings are as above.  VENTILATOR SETTINGS:    Cultures   Antimicrobials:    Devices    LINES / TUBES:      Continuous Infusions: . sodium chloride 10 mL/hr at 04/11/19 0600  . levETIRAcetam 500 mg (04/11/19  0814)     Objective: Vitals:   04/10/19 2350 04/11/19 0240 04/11/19 0335 04/11/19 0335  BP: (!) 172/92  (!) 135/95 (!) 135/95  Pulse: (!) 106 98 (!) 110 (!) 108  Resp: '18 20 18 18  '$ Temp: 98.2 F (36.8 C)  98.1 F (36.7 C) 98.1 F (36.7 C)  TempSrc: Oral  Oral Oral  SpO2: 100%  100% 100%  Weight:      Height:        Intake/Output Summary (Last 24 hours) at 04/11/2019 1009 Last data filed at 04/11/2019 6301 Gross per 24 hour  Intake 837.1 ml  Output 200 ml  Net 637.1 ml   Filed Weights   04/04/19 1620 04/05/19 2000  Weight: 62.6 kg 57.2 kg    Examination:  General: A/O x0, does not follow commands, no acute respiratory distress, cachectic Eyes: negative scleral hemorrhage, negative anisocoria, negative icterus ENT: Negative Runny nose, negative gingival bleeding, Neck:  Negative scars, masses, torticollis, lymphadenopathy, JVD Lungs: Clear to auscultation bilaterally without wheezes or crackles Cardiovascular: Regular rate and rhythm without murmur gallop or rub normal S1 and S2 Abdomen: negative abdominal pain, nondistended, positive soft, bowel sounds, no rebound, no ascites, no appreciable mass  Extremities: No significant cyanosis, clubbing, or edema bilateral lower extremities Skin: Negative rashes, lesions, ulcers Psychiatric: Unable to assess secondary to AMS Central nervous system:  Cranial nerves II through XII intact, tongue/uvula midline, mumbles incomprehensibly, does not follow commands, spontaneously moves bilateral upper extremity .     Data Reviewed: Care during the described time interval was provided by me .  I have reviewed this patient's available data, including medical history, events of note, physical examination, and all test results as part of my evaluation.  CBC: Recent Labs  Lab 04/04/19 1646 04/05/19 0435 04/08/19 0752 04/11/19 0446  WBC 6.7 8.5 6.8 6.6  NEUTROABS 4.1  --  4.4 4.0  HGB 13.2 12.9 12.4 12.6  HCT 41.2 40.3 38.4 38.9    MCV 99.5 98.3 98.5 97.3  PLT 261 261 271 537   Basic Metabolic Panel: Recent Labs  Lab 04/04/19 1646 04/05/19 0435 04/08/19 0752 04/11/19 0446  NA 144 143 145 144  K 3.4* 3.4* 3.5 3.2*  CL 108 107 112* 113*  CO2 '28 25 24 25  '$ GLUCOSE 482* 144* 124* 127*  BUN 32* 21 32* 17  CREATININE 0.89 0.64 0.79 0.59  CALCIUM 9.5 9.4 9.7 9.7  MG  --   --   --  1.7  PHOS  --   --   --  2.4*   GFR: Estimated Creatinine Clearance: 43 mL/min (by C-G formula based on SCr of 0.59 mg/dL). Liver Function Tests: Recent Labs  Lab 04/04/19 1646  AST 38  ALT 49*  ALKPHOS 84  BILITOT 0.7  PROT 7.5  ALBUMIN 3.1*   No results for input(s): LIPASE, AMYLASE in the last 168 hours. Recent Labs  Lab 04/04/19 1646  AMMONIA <9*   Coagulation Profile: Recent Labs  Lab 04/04/19 1646  INR 1.0   Cardiac Enzymes: No results for input(s): CKTOTAL, CKMB, CKMBINDEX, TROPONINI in the last 168 hours. BNP (last 3 results) No results for input(s): PROBNP in the last 8760 hours. HbA1C: No results for input(s): HGBA1C in the last 72 hours. CBG: Recent Labs  Lab 04/10/19 0635 04/10/19 1151 04/10/19 1717 04/10/19 2336 04/11/19 0556  GLUCAP 133* 141* 127* 103* 120*   Lipid Profile: No results for input(s): CHOL, HDL, LDLCALC, TRIG, CHOLHDL, LDLDIRECT in the last 72 hours. Thyroid Function Tests: No results for input(s): TSH, T4TOTAL, FREET4, T3FREE, THYROIDAB in the last 72 hours. Anemia Panel: No results for input(s): VITAMINB12, FOLATE, FERRITIN, TIBC, IRON, RETICCTPCT in the last 72 hours. Sepsis Labs: No results for input(s): PROCALCITON, LATICACIDVEN in the last 168 hours.  Recent Results (from the past 240 hour(s))  SARS CORONAVIRUS 2 (TAT 6-24 HRS) Nasopharyngeal Nasopharyngeal Swab     Status: None   Collection Time: 04/04/19  4:46 PM   Specimen: Nasopharyngeal Swab  Result Value Ref Range Status   SARS Coronavirus 2 NEGATIVE NEGATIVE Final    Comment: (NOTE) SARS-CoV-2 target  nucleic acids are NOT DETECTED. The SARS-CoV-2 RNA is generally detectable in upper and lower respiratory specimens during the acute phase of infection. Negative results do not preclude SARS-CoV-2 infection, do not rule out co-infections with other pathogens, and should not be used as the sole basis for treatment or other patient management decisions. Negative results must be combined with clinical observations, patient history, and epidemiological information. The expected result is Negative. Fact Sheet for Patients: SugarRoll.be Fact Sheet for Healthcare Providers: https://www.-mathews.com/ This test is not yet approved or cleared by the [place] and  has been authorized  for detection and/or diagnosis of SARS-CoV-2 by FDA under an Emergency Use Authorization (EUA). This EUA will remain  in effect (meaning this test can be used) for the duration of the COVID-19 declaration under Section 56 4(b)(1) of the Act, 21 U.S.C. section 360bbb-3(b)(1), unless the authorization is terminated or revoked sooner. Performed at [facility], [place] [address], [place], [place] [address]   Urine culture     Status: Abnormal   Collection Time: 04/04/19  4:46 PM   Specimen: Urine, Clean Catch  Result Value Ref Range Status   Specimen Description   Final    URINE, CLEAN CATCH Performed at [facility], [place] [address] [place], [place] [address]    Special Requests   Final    NONE Performed at [facility], [place] [address] [place], [place] [address]    Culture >=100,000 COLONIES/mL KLEBSIELLA PNEUMONIAE (A)  Final   Report Status 04/06/2019 FINAL  Final   Organism ID, Bacteria KLEBSIELLA PNEUMONIAE (A)  Final      Susceptibility   Klebsiella pneumoniae - MIC*    AMPICILLIN >=32 RESISTANT Resistant     CEFAZOLIN <=4 SENSITIVE Sensitive     CEFTRIAXONE <=0.25 SENSITIVE Sensitive     CIPROFLOXACIN  <=0.25 SENSITIVE Sensitive     GENTAMICIN <=1 SENSITIVE Sensitive     IMIPENEM <=0.25 SENSITIVE Sensitive     NITROFURANTOIN 128 RESISTANT Resistant     TRIMETH/SULFA <=20 SENSITIVE Sensitive     AMPICILLIN/SULBACTAM 4 SENSITIVE Sensitive     PIP/TAZO <=4 SENSITIVE Sensitive     * >=100,000 COLONIES/mL KLEBSIELLA PNEUMONIAE  MRSA PCR Screening     Status: None   Collection Time: 04/05/19  8:09 PM   Specimen: Nasal Mucosa; Nasopharyngeal  Result Value Ref Range Status   MRSA by PCR NEGATIVE NEGATIVE Final    Comment:        The GeneXpert MRSA Assay (FDA approved for NASAL specimens only), is one component of a comprehensive MRSA colonization surveillance program. It is not intended to diagnose MRSA infection nor to guide or monitor treatment for MRSA infections. Performed at [facility], [place] [address] [place], [place] [address]          Radiology Studies: CT HEAD WO CONTRAST  Result Date: 04/09/2019 CLINICAL DATA:  Follow-up intracranial hemorrhage EXAM: CT HEAD WITHOUT CONTRAST TECHNIQUE: Contiguous axial images were obtained from the base of the skull through the vertex without intravenous contrast. COMPARISON:  Five days ago FINDINGS: Brain: More hazy appearance of the intraventricular hemorrhage that remains predominantly in the lateral ventricles. There is ventriculomegaly that is attributed to both atrophy and partial obstruction when compared with remote imaging. Ventriculomegaly is mildly progressed when measured at the frontal horns of the lateral ventricles. Low-density in the cerebral white matter is not clearly changed and attributed to chronic small vessel ischemia. No evidence of interval infarct. Vascular: Atherosclerotic calcification. Skull: Negative Sinuses/Orbits: Negative IMPRESSION: 1. No convincing change in the extensive intraventricular clot. 2. Ventriculomegaly from atrophy and partial obstruction when compared with more remote emerging.  The ventriculomegaly has mildly increased when measured at frontal horns. Electronically Signed   By: Monte Fantasia M.D.   On: 04/09/2019 11:15        Scheduled Meds: . chlorhexidine  15 mL Mouth Rinse BID  . Chlorhexidine Gluconate Cloth  6 each Topical Daily  . donepezil  5 mg Oral QHS  . hydrALAZINE  10 mg Intravenous Q6H  . mouth rinse  15 mL Mouth Rinse q12n4p  . QUEtiapine  12.5 mg Oral QHS  . sertraline  25 mg Oral Daily   Continuous Infusions: . sodium chloride 10 mL/hr at 04/11/19 0600  . levETIRAcetam 500 mg (04/11/19 0814)     LOS: 7 days    Time spent:40 min    Jonmarc Bodkin, Geraldo Docker, MD [facility] Pager [phone]  If 7PM-7AM, please contact night-coverage [email or web address] Password [facility] 04/11/2019, 10:09 AM

## 2019-04-11 NOTE — Progress Notes (Signed)
[employer]er, maintenance [facility]) Hospital Liaison note.     Spoke with daughter to confirm interest and explain services. Daughter will meet tomorrow to complete paperwork.   [facility] will notify TOC when registration paperwork has been completed to arrange transport.   RN please call report to [phone].   Thank you,     Farrel Gordon, RN, CCM       [facility] (listed on [place] under [facility])     [phone]

## 2019-04-11 NOTE — Plan of Care (Signed)
  Problem: Education: Goal: Knowledge of disease or condition will improve Outcome: Progressing Goal: Knowledge of secondary prevention will improve Outcome: Progressing Goal: Knowledge of patient specific risk factors addressed and post discharge goals established will improve Outcome: Progressing Goal: Individualized Educational Video(s) Outcome: Progressing   Problem: Coping: Goal: Will verbalize positive feelings about self Outcome: Progressing Goal: Will identify appropriate support needs Outcome: Progressing   Problem: Nutrition: Goal: Risk of aspiration will decrease Outcome: Progressing   Problem: Intracerebral Hemorrhage Tissue Perfusion: Goal: Complications of Intracerebral Hemorrhage will be minimized Outcome: Progressing   Problem: Education: Goal: Knowledge of General Education information will improve Description: Including pain rating scale, medication(s)/side effects and non-pharmacologic comfort measures Outcome: Progressing   Problem: Health Behavior/Discharge Planning: Goal: Ability to manage health-related needs will improve Outcome: Progressing   Problem: Clinical Measurements: Goal: Ability to maintain clinical measurements within normal limits will improve Outcome: Progressing Goal: Will remain free from infection Outcome: Progressing Goal: Diagnostic test results will improve Outcome: Progressing Goal: Respiratory complications will improve Outcome: Progressing Goal: Cardiovascular complication will be avoided Outcome: Progressing   Problem: Activity: Goal: Risk for activity intolerance will decrease Outcome: Progressing   Problem: Nutrition: Goal: Adequate nutrition will be maintained Outcome: Progressing   Problem: Coping: Goal: Level of anxiety will decrease Outcome: Progressing   Problem: Elimination: Goal: Will not experience complications related to bowel motility Outcome: Progressing Goal: Will not experience complications  related to urinary retention Outcome: Progressing   Problem: Pain Managment: Goal: General experience of comfort will improve Outcome: Progressing   Problem: Safety: Goal: Ability to remain free from injury will improve Outcome: Progressing   Problem: Skin Integrity: Goal: Risk for impaired skin integrity will decrease Outcome: Progressing

## 2019-04-11 NOTE — Progress Notes (Signed)
Daily Progress Note   Patient Name: Jocelyn Mccann       Date: 04/11/2019 DOB: June 29, 1928  Age: 86 y.o. MRN#: [id number] Attending Physician: Allie Bossier, MD Primary Care Physician: [name], MD Admit Date: 04/04/2019  Reason for Consultation/Follow-up: Establishing goals of care  Subjective: Jocelyn Mccann eyes are open.  She ate most of her lunch with 100% assistance from her bedside RN.  Discussed with transitions of care manager, discussed with daughter on the phone.  See below.  Length of Stay: 7  Current Medications: Scheduled Meds:  . chlorhexidine  15 mL Mouth Rinse BID  . Chlorhexidine Gluconate Cloth  6 each Topical Daily  . donepezil  5 mg Oral QHS  . hydrALAZINE  10 mg Intravenous Q6H  . mouth rinse  15 mL Mouth Rinse q12n4p  . QUEtiapine  12.5 mg Oral QHS  . sertraline  25 mg Oral Daily    Continuous Infusions: . sodium chloride 10 mL/hr at 04/11/19 0600  . levETIRAcetam 500 mg (04/11/19 0814)  . magnesium sulfate bolus IVPB 2 g (04/11/19 1107)  . potassium chloride      PRN Meds: sodium chloride, acetaminophen, bisacodyl, labetalol, lip balm  Physical Exam         Awake Shallow regular air movements Does not have edema Tachycardic Abdomen is not distended  Vital Signs: BP (!) 135/95 (BP Location: Left Arm)   Pulse (!) 108   Temp 98.1 F (36.7 C) (Oral)   Resp 18   Ht 5\' 6"  (1.676 m)   Wt 57.2 kg   SpO2 100%   BMI 20.35 kg/m  SpO2: SpO2: 100 % O2 Device: O2 Device: Room Air O2 Flow Rate: O2 Flow Rate (L/min): 2 L/min  Intake/output summary:   Intake/Output Summary (Last 24 hours) at 04/11/2019 1130 Last data filed at 04/11/2019 [id number] Gross per 24 hour  Intake 737.1 ml  Output 200 ml  Net 537.1 ml   LBM: Last BM Date: 04/10/19  Baseline Weight: Weight: 62.6 kg Most recent weight: Weight: 57.2 kg       Palliative Assessment/Data:      Patient Active Problem List   Diagnosis Date Noted  . Intracranial hemorrhage ([place]) 04/04/2019  . Klebsiella cystitis 02/17/2019  . Cerebrovascular accident (CVA) of right thalamus ([place]) 02/17/2019  . Acute  cerebrovascular accident (CVA) of cerebellum ([place]) 02/17/2019  . HCAP (healthcare-associated pneumonia) 02/09/2019  . Sepsis ([place]) 02/09/2019  . Pressure injury of skin 02/07/2019  . Hypoglycemia without diagnosis of diabetes mellitus 02/06/2019  . Seizures ([place]) 02/06/2019  . Pancytopenia ([place]) 02/06/2019  . Palliative care by specialist   . Goals of care, counseling/discussion   . Acute encephalopathy   . Dysphagia   . SIRS (systemic inflammatory response syndrome) ([place]) 02/01/2019  . Syncope 12/05/2018  . SDH (subdural hematoma) ([place]) 08/21/2018  . Malignant neoplasm of upper-outer quadrant of right breast in female, estrogen receptor positive ([place]) 12/20/2017  . Breast cancer ([place])   . Osteoarthritis of knee 06/01/2017  . Bilateral knee pain 03/31/2017  . Urge incontinence of urine 03/31/2017  . Generalized anxiety disorder 03/31/2017  . Osteoporosis without current pathological fracture 03/31/2017  . Alzheimer disease ([place]) 03/31/2017    Palliative Care Assessment & Plan   Patient Profile: 86 year old female with past medical history of Alzheimer's disease, CVA, dysphagia, subdural hematoma with seizures, right breast cancer status post lumpectomy on treatment with anastrozole who presented to the ED with decreased responsiveness and was found to have intraventricular hemorrhage into both ventricles that extends the third ventricle with mild hydrocephalus and midline shift. Neurosurgery consulted and not a good candidate for external ventricular drain placement.Palliative consulted for goals of care.  Assessment: Intraventricular hemorrhage Acute  encephalopathy Recent UTI Functional decline, cognitive decline   Recommendations/Plan:  Family meeting on the phone call, call placed and discussed with patient's daughter Jocelyn Mccann.  Previously, she has interacted with my colleague Dr. Domingo Cocking from the palliative medicine team.  On 3-30, daughter requested to further discuss with hospice.  Hence, she discussed with Jocelyn Mccann from hospice.   We reviewed about the patient's CT scan in detail.  We recapped her stay at [facility].  We discussed about patient's underlying multiple chronic conditions.  General disposition options discussed.  General prognosis after extensive brain bleed also discussed.  Goals wishes and values attempted to be elicited.  Appropriate disposition options attempted to be elicited.  We discussed about pathophysiology of hemorrhagic stroke, the impact that frequent falls might have as well as atrophy and CT evidence of ventriculomegaly and ongoing decline from a dementia perspective.  All of her questions and concerns addressed to the best of my ability.  She wishes to discuss further with transitions of care regarding her decision for an appropriate disposition.  Code Status:    Code Status Orders  (From admission, onward)         Start     Ordered   04/08/19 1851  Do not attempt resuscitation (DNR)  Continuous    Question Answer Comment  In the event of cardiac or respiratory ARREST Do not call a "code blue"   In the event of cardiac or respiratory ARREST Do not perform Intubation, CPR, defibrillation or ACLS   In the event of cardiac or respiratory ARREST Use medication by any route, position, wound care, and other measures to relive pain and suffering. May use oxygen, suction and manual treatment of airway obstruction as needed for comfort.      04/08/19 1850        Code Status History    Date Active Date Inactive Code Status Order ID Comments User Context   04/04/2019 1952 04/08/2019 1850 Full  Code [id number]  Orene Desanctis, DO ED   02/02/2019 0053 02/13/2019 1926 Full Code [id number]  Bennie Pierini, MD ED  02/01/2019 1809 02/02/2019 0053 Full Code [id number]  Blain Pais, MD ED   08/21/2018 1523 08/26/2018 1818 Full Code [id number]  Karmen Bongo, MD ED   Advance Care Planning Activity    Advance Directive Documentation     Most Recent Value  Type of Advance Directive  Healthcare Power of Attorney  Pre-existing out of facility DNR order (yellow form or pink MOST form)  -  "MOST" Form in Place?  -       Prognosis:  Guarded, could be 2 weeks or less if there is ongoing decline, extension of brain bleed  Discharge Planning:  To Be Determined, different types of residential hospice options are being explored.  Patient came from [place], will not be able to participate in rehabilitation efforts in my opinion, was previously living at home with daughter.  Would benefit from either residential hospice or long-term care facility if residential hospice is not an option.  Care plan was discussed with daughter Jocelyn Mccann on the phone  Thank you for allowing the Palliative Medicine Team to assist in the care of this patient.   Time In: 11 Time Out: 11.35 Total Time 35 Prolonged Time Billed  no       Greater than 50%  of this time was spent counseling and coordinating care related to the above assessment and plan.  Loistine Chance, MD  Please contact Palliative Medicine Team phone at [phone] for questions and concerns.

## 2019-04-11 NOTE — Evaluation (Signed)
Physical Therapy Evaluation Patient Details Name: Jocelyn Mccann MRN: [id number] DOB: [birth year] Today's Date: 04/11/2019   History of Present Illness  Jocelyn Mccann is a 86 y.o. female with PMH of breast cancer on treatment with anastrozole, dementia, recurrent syncope, presenting from home due to fall and AMS. In ED Pt was found neutropenic, hypoglycemic, hypothermic and hypotensive. Brain MRI shows subacute infarcts R thalamus, R thalamocapsular junction, R external capsule, and R cerebellum, MRI C-spine without acute process. Likely Pt with Multifocal toxic metabolic encephalopathy.  Clinical Impression  Patient evaluated by Physical Therapy with no further acute PT needs identified. All education has been completed and the patient has no further questions.  Pt is not able to follow any commands during PT eval, she grimaces as if in  pain with any imposed movement. Pt is not a candidate for PT/rehab in the acute setting or post acute given her declining medical and mental status, inability to follow commands, etc. See below for further details    See below for any follow-up Physical Therapy or equipment needs. PT is signing off. Thank you for this referral.     Follow Up Recommendations No PT follow up    Equipment Recommendations  None recommended by PT(unless home with Hospice/and if so hospice would handle DME)    Recommendations for Other Services       Precautions / Restrictions Precautions Precautions: Fall Restrictions Weight Bearing Restrictions: No      Mobility  Bed Mobility Overal bed mobility: Needs Assistance Bed Mobility: Rolling Rolling: Total assist         General bed mobility comments: grimaces with attempts to return to supine, to roll to right side  Transfers                 General transfer comment: not attempted d/t pt grimacing as if in pain, and unable to follow commands  Ambulation/Gait             General Gait Details: not  attempted  Stairs            Wheelchair Mobility    Modified Rankin (Stroke Patients Only)       Balance                                             Pertinent Vitals/Pain Pain Assessment: Faces Faces Pain Scale: Hurts even more Pain Descriptors / Indicators: Grimacing Pain Intervention(s): Limited activity within patient's tolerance;Monitored during session;Repositioned    Home Living Family/patient expects to be discharged to:: Other (Comment)                 Additional Comments: pt is unable to provide info/answer questions    Prior Function           Comments: unsure of PLOF due to pt  being poor historian, no family present     Hand Dominance        Extremity/Trunk Assessment   Upper Extremity Assessment Upper Extremity Assessment: Generalized weakness;Difficult to assess due to impaired cognition RUE Deficits / Details: able to perform bil shoulder ROM to ~ 80 degrees, bil elbows AA/PROM grossly WFL although pt grimaces with all imposed movement, resistant at times. moves bil UE spontanteously at times however not on command or with purpose    Lower Extremity Assessment Lower Extremity Assessment: Generalized weakness;Difficult to assess due to  impaired cognition;RLE deficits/detail RLE Deficits / Details: unable to fully assess d/t pt grimacing with pain with imposed movement.able to passively flex knee ~ 30-40 degrees, ankle PROM grossly WFL, incr grimacing with hip PROM. no spontaneous movement noted in LEs LLE Deficits / Details: unable to fully assess d/t pt grimacing with pain with imposed movement.able to passively flex knee ~ 0-30 degrees, ankle PROM grossly WFL, incr grimacing with hip PROM. no spontaneous movement noted in LEs       Communication      Cognition Arousal/Alertness: (seems awake however keeps eyes closed most of session) Behavior During Therapy: Restless Overall Cognitive Status: History of cognitive  impairments - at baseline Area of Impairment: Following commands                       Following Commands: Follows one step commands inconsistently;Follows multi-step commands inconsistently       General Comments: pt is unable to follow any commands during PT eval, briefly opens eyes one time; pt is in bil mitten restraints, she is repeatedly biting her righ tmitten. offerred pt water an she was able to take a sip with full assist to hold cup and place straw      General Comments General comments (skin integrity, edema, etc.): pt left with pads between knees, heels floated and turned to pt right side    Exercises     Assessment/Plan    PT Assessment Patent does not need any further PT services  PT Problem List         PT Treatment Interventions      PT Goals (Current goals can be found in the Care Plan section)  Acute Rehab PT Goals Patient Stated Goal: unable to state PT Goal Formulation: All assessment and education complete, DC therapy    Frequency     Barriers to discharge        Co-evaluation               AM-PAC PT "6 Clicks" Mobility  Outcome Measure Help needed turning from your back to your side while in a flat bed without using bedrails?: Total Help needed moving from lying on your back to sitting on the side of a flat bed without using bedrails?: Total Help needed moving to and from a bed to a chair (including a wheelchair)?: Total Help needed standing up from a chair using your arms (e.g., wheelchair or bedside chair)?: Total Help needed to walk in hospital room?: Total Help needed climbing 3-5 steps with a railing? : Total 6 Click Score: 6    End of Session   Activity Tolerance: Other (comment);Patient limited by pain(limited by cognition) Patient left: in bed;with call bell/phone within reach;with bed alarm set   PT Visit Diagnosis: Other symptoms and signs involving the nervous system (R29.898);Adult, failure to thrive (R62.7);Other  abnormalities of gait and mobility (R26.89)    Time: 1215-1227 PT Time Calculation (min) (ACUTE ONLY): 12 min   Charges:   PT Evaluation $PT Eval Low Complexity: 1 Low          Jaire Pinkham, PT   Acute Rehab Dept [facility]): [id number]   04/11/2019   [facility] 04/11/2019, 2:15 PM

## 2019-04-12 ENCOUNTER — Other Ambulatory Visit: Payer: Self-pay | Admitting: *Deleted

## 2019-04-12 DIAGNOSIS — I10 Essential (primary) hypertension: Secondary | ICD-10-CM

## 2019-04-12 LAB — CBC WITH DIFFERENTIAL/PLATELET
Abs Immature Granulocytes: 0.02 10*3/uL (ref 0.00–0.07)
Basophils Absolute: 0 10*3/uL (ref 0.0–0.1)
Basophils Relative: 1 %
Eosinophils Absolute: 0.1 10*3/uL (ref 0.0–0.5)
Eosinophils Relative: 2 %
HCT: 35.8 % — ABNORMAL LOW (ref 36.0–46.0)
Hemoglobin: 11.5 g/dL — ABNORMAL LOW (ref 12.0–15.0)
Immature Granulocytes: 0 %
Lymphocytes Relative: 25 %
Lymphs Abs: 1.6 10*3/uL (ref 0.7–4.0)
MCH: 31.3 pg (ref 26.0–34.0)
MCHC: 32.1 g/dL (ref 30.0–36.0)
MCV: 97.3 fL (ref 80.0–100.0)
Monocytes Absolute: 0.9 10*3/uL (ref 0.1–1.0)
Monocytes Relative: 14 %
Neutro Abs: 3.6 10*3/uL (ref 1.7–7.7)
Neutrophils Relative %: 58 %
Platelets: 212 10*3/uL (ref 150–400)
RBC: 3.68 MIL/uL — ABNORMAL LOW (ref 3.87–5.11)
RDW: 13.7 % (ref 11.5–15.5)
WBC: 6.2 10*3/uL (ref 4.0–10.5)
nRBC: 0 % (ref 0.0–0.2)

## 2019-04-12 LAB — COMPREHENSIVE METABOLIC PANEL
ALT: 26 U/L (ref 0–44)
AST: 24 U/L (ref 15–41)
Albumin: 2.4 g/dL — ABNORMAL LOW (ref 3.5–5.0)
Alkaline Phosphatase: 69 U/L (ref 38–126)
Anion gap: 5 (ref 5–15)
BUN: 16 mg/dL (ref 8–23)
CO2: 25 mmol/L (ref 22–32)
Calcium: 9.3 mg/dL (ref 8.9–10.3)
Chloride: 112 mmol/L — ABNORMAL HIGH (ref 98–111)
Creatinine, Ser: 0.55 mg/dL (ref 0.44–1.00)
GFR calc Af Amer: >60 mL/min (ref >60)
GFR calc non Af Amer: >60 mL/min (ref >60)
Glucose, Bld: 120 mg/dL — ABNORMAL HIGH (ref 70–99)
Potassium: 3.7 mmol/L (ref 3.5–5.1)
Sodium: 142 mmol/L (ref 135–145)
Total Bilirubin: 0.7 mg/dL (ref 0.3–1.2)
Total Protein: 5.8 g/dL — ABNORMAL LOW (ref 6.5–8.1)

## 2019-04-12 LAB — GLUCOSE, CAPILLARY
Glucose-Capillary: 102 mg/dL — ABNORMAL HIGH (ref 70–99)
Glucose-Capillary: 107 mg/dL — ABNORMAL HIGH (ref 70–99)
Glucose-Capillary: 123 mg/dL — ABNORMAL HIGH (ref 70–99)

## 2019-04-12 LAB — PHOSPHORUS: Phosphorus: 2.2 mg/dL — ABNORMAL LOW (ref 2.5–4.6)

## 2019-04-12 LAB — MAGNESIUM: Magnesium: 1.9 mg/dL (ref 1.7–2.4)

## 2019-04-12 MED ORDER — LIP MEDEX EX OINT: TOPICAL_OINTMENT | CUTANEOUS | 0 refills | Status: AC | PRN

## 2019-04-12 MED ORDER — ACETAMINOPHEN 650 MG RE SUPP: 650.0000 mg | Freq: Four times a day (QID) | RECTAL | 0 refills | Status: AC | PRN

## 2019-04-12 MED ORDER — LEVETIRACETAM IN NACL 500 MG/100ML IV SOLN: 500.0000 mg | Freq: Two times a day (BID) | INTRAVENOUS | 0 refills | Status: AC

## 2019-04-12 NOTE — Progress Notes (Addendum)
No urine output in purewick during the night, bed dry.  Bladder scan showing greater than 367mL.  Pt unable to verbalize urge to urinate.  Paged Sharlet Salina, [place] floor coverage about findings, awaiting orders  In and out cath order PRN q8h, when attempting to cath patient, pt voided 273mL that was caught in purewick canister, likely greater than 352mL with what was not measured  Post void bladder scan showing 0

## 2019-04-12 NOTE — Care Management Important Message (Signed)
Important Message  Patient Details IM Letter given to Gabriel Earing RN Case Manager to present to the Patient Name: Jocelyn Mccann MRN: [id number] Date of Birth: 04-Sep-1928   Medicare Important Message Given:  Yes     Kerin Salen 04/12/2019, 10:51 AM

## 2019-04-12 NOTE — TOC Progression Note (Addendum)
Transition of Care [facility]) - Progression Note    Patient Details  Name: Jocelyn Mccann MRN: [id number] Date of Birth: 06-14-1928  [facility]) CM/SW Contact  Purcell Mouton, RN Phone Number: 04/12/2019, 2:37 PM  Clinical Narrative:     Pt is discharging to [facility]. [facility] was called for transportation, RN is aware.        Expected Discharge Plan and Services           Expected Discharge Date: 04/12/19                                     Social Determinants of Health (SDOH) Interventions    Readmission Risk Interventions No flowsheet data found.

## 2019-04-12 NOTE — Discharge Summary (Signed)
Physician Discharge Summary  Jocelyn Mccann [id number] DOB: [birth year] DOA: 04/04/2019  PCP: Jocelyn Duos, MD  Admit date: 04/04/2019 Discharge date: 04/12/2019  Time spent: 30 minutes  Recommendations for Outpatient Follow-up:   Intraventricular hemorrhage in the setting of hx of CVA and frequent falls -Per radiology, patient most likely bled from a remote thalamic infarct than on trauma. -Per neurosurgery, she is not a candidate for surgical intervention based on her age, comorbidities. -Given the extensive nature of her bleed, she is likely to develop progressiverespiratory and cardiac compromise. -Patient opens eyes, does not follow commands spontaneously moves upper extremities.  Grimaces to painful stimuli.   -At family's request, CT head was repeatedon 3/29. It did not show any change in the extensive intraventricular clot.  -Had a long conversation with daughter Jocelyn Mccann and explained that after reviewing patient's CT scan, and neurosurgery notes patient's mortality was extremely high probably 95 to 100% given her 86, area of bleed, and comorbidities.   -Daughter has agreed to have patient transported to [facility] for hosp[facility] care.  Acute encephalopathy -Primarily due to intracranial hemorrhage. Also contributed by UTI. -Patient was deeply comatose for first 2 days. -At times patient will swallow oral intake, however not consistently.   -Likelihood of patient's encephalopathy improving is small.  Do not expect any meaningful recovery  UTI positive Klebsiella pneumoniae -Completed course of antibiotics  Essential HTN -Counseled daughter that at hospice, they usually do not check blood pressure nor dispense BP medication.  However Dr. Rowe Pavy will have [facility] contact patient to answer more in-depth questions on medication.  Sinus tachycardia -Resolved  Seizure -We will discharge patient on IV Keppra. -[facility] will determine if they continue  this medication.  Dementia -Meds on hold  Hx of breast cancer -Meds on hold  Hypokalemia -Potassium goal> 4 -Potassium PO 40 mEq  Hypomagnesmia -Magnesium goal> 2 -Magnesium IV 2 g   Discharge Diagnoses:  Principal Problem:   Intracranial hemorrhage (HCC) Active Problems:   Alzheimer disease ([place])   Breast cancer ([place])   Seizures ([place])   Discharge Condition: Expected  Diet recommendation: Dysphagia 1 fluid consistency thin  Filed Weights   04/04/19 1620 04/05/19 2000  Weight: 62.6 kg 57.2 kg    History of present illness:  Jocelyn Rymer Howardis a 86 y.o[place] PMHx significant for forAlzheimer's disease, CVA, dysphagia, history of subdural hematoma with seizures, history of right breast cancer s/p lumpectomyon treatment with anastrozole.  Patient was to the ED with concerns of decreased responsiveness. Per chart, patient has been progressively declining physically and mentally over the last year. Back in January 2021 patient had an unwitnessed fall and was found to be obtunded and had seizures. She was hospitalized, intubated, managed for septic shock due to UTI, had Klebsiella pneumonia. She was discharged to [facility] from rehab. Patient reportedly has had several falls in the last 2 months. Twice she was found at the side of her bed and sometime in March she fell out of wheelchair and hit her head. Daughter reports that she was told by the facility that patient has had decreased appetite, decreased responsiveness for last 2 days and was hence sent to the ED.  In the ED, patient was hypertensive but otherwise stable on room air. She was only responsive to painful stimuli. She was hypertensive but otherwise stable on room air.  CT head showed intraventricular hemorrhage into both lateral ventricles and extends to the third ventricle with mild hydrocephalus.  Neurosurgery was consulted  by ED physician. Per neurosurgery, the risks of on outweigh the  benefits of an external ventricular drain placement.  Patient was admitted to medicine service.  Hospital Course:  Unfortunately during this hospitalization patient was found to have extensive intraventricular hemorrhagic CVA.     Consultations: Neurosurgery NP [place]  Cultures   3/24 SARS coronavirus negative 3/24 urine positive Klebsiella pneumoniae 3/25 MRSA by PCR negative    Antibiotics Anti-infectives (From admission, onward)   Start     Dose/Rate Stop   04/10/19 1000  ceFAZolin (ANCEF) IVPB 1 g/50 mL premix     1 g 100 mL/hr over 30 Minutes 04/10/19 2244   04/06/19 0900  cefTRIAXone (ROCEPHIN) 1 g in sodium chloride 0.9 % 100 mL IVPB  Status:  Discontinued     1 g 200 mL/hr over 30 Minutes 04/09/19 1326       Discharge Exam: Vitals:   04/11/19 2153 04/12/19 0558 04/12/19 1117 04/12/19 1235  BP: (!) 154/86 139/76 139/76 (!) 108/50  Pulse: 99 97 88 99  Resp: 16 16  14   Temp: 97.6 F (36.4 C) (!) 97 F (36.1 C)  (!) 97.2 F (36.2 C)  TempSrc: Oral Axillary  Axillary  SpO2: 99% 100%  98%  Weight:      Height:        General: A/O x0, does not follow commands, no acute respiratory distress, cachectic Eyes: negative scleral hemorrhage, negative anisocoria, negative icterus ENT: Negative Runny nose, negative gingival bleeding, Neck:  Negative scars, masses, torticollis, lymphadenopathy, JVD Lungs: Clear to auscultation bilaterally without wheezes or crackles Cardiovascular: Regular rate and rhythm without murmur gallop or rub normal S1 and S2 Central nervous system:  Cranial nerves II through XII intact, tongue/uvula midline, mumbles incomprehensibly, does not follow commands, spontaneously moves bilateral upper extremity .    Discharge Instructions   Allergies as of 04/12/2019      Reactions   Lactose Intolerance (gi)    Pork-derived Products Other (See Comments)   PATIENT DOES NOT EAT PORK      Medication List    STOP taking  these medications   acetaminophen 325 MG tablet Commonly known as: TYLENOL Replaced by: acetaminophen 650 MG suppository You also have another medication with the same name that you need to continue taking as instructed.   anastrozole 1 MG tablet Commonly known as: ARIMIDEX   aspirin EC 81 MG tablet   bisacodyl 10 MG suppository Commonly known as: DULCOLAX   cholecalciferol 25 MCG (1000 UNIT) tablet Commonly known as: VITAMIN D3   donepezil 5 MG tablet Commonly known as: ARICEPT   levETIRAcetam 500 MG tablet Commonly known as: KEPPRA   magnesium hydroxide 400 MG/5ML suspension Commonly known as: MILK OF MAGNESIA   melatonin 1 MG Tabs tablet   omeprazole 20 MG capsule Commonly known as: PRILOSEC   QUEtiapine 25 MG tablet Commonly known as: SEROQUEL   RA SALINE ENEMA RE   sertraline 25 MG tablet Commonly known as: ZOLOFT     TAKE these medications   acetaminophen 500 MG tablet Commonly known as: TYLENOL Take 500 mg by mouth. PRIOR TO THERAPY What changed:   Another medication with the same name was added. Make sure you understand how and when to take each.  Another medication with the same name was removed. Continue taking this medication, and follow the directions you see here.   acetaminophen 650 MG suppository Commonly known as: TYLENOL Place 1 suppository (650 mg total) rectally every 6 (  six) hours as needed for fever or mild pain. What changed: You were already taking a medication with the same name, and this prescription was added. Make sure you understand how and when to take each. Replaces: acetaminophen 325 MG tablet   feeding supplement Liqd Commonly known as: BOOST / RESOURCE BREEZE Take 1 Container by mouth in the morning and at bedtime.   levETIRAcetam 500 MG/100ML Soln Commonly known as: KEPRRA Inject 100 mLs (500 mg total) into the vein every 12 (twelve) hours.   lip balm ointment Apply topically as needed for lip care.   multivitamin  with minerals Tabs tablet Take 1 tablet by mouth daily.   traMADol 50 MG tablet Commonly known as: ULTRAM Take 50 mg by mouth every 6 (six) hours as needed for moderate pain or severe pain.   Voltaren 1 % Gel Generic drug: diclofenac Sodium 2 g. Apply 2 grams to both knees once daily for pain      Allergies  Allergen Reactions  . Lactose Intolerance (Gi)   . Pork-Derived Products Other (See Comments)    PATIENT DOES NOT EAT PORK      The results of significant diagnostics from this hospitalization (including imaging, microbiology, ancillary and laboratory) are listed below for reference.    Significant Diagnostic Studies: CT HEAD WO CONTRAST  Result Date: 04/09/2019 CLINICAL DATA:  Follow-up intracranial hemorrhage EXAM: CT HEAD WITHOUT CONTRAST TECHNIQUE: Contiguous axial images were obtained from the base of the skull through the vertex without intravenous contrast. COMPARISON:  Five days ago FINDINGS: Brain: More hazy appearance of the intraventricular hemorrhage that remains predominantly in the lateral ventricles. There is ventriculomegaly that is attributed to both atrophy and partial obstruction when compared with remote imaging. Ventriculomegaly is mildly progressed when measured at the frontal horns of the lateral ventricles. Low-density in the cerebral white matter is not clearly changed and attributed to chronic small vessel ischemia. No evidence of interval infarct. Vascular: Atherosclerotic calcification. Skull: Negative Sinuses/Orbits: Negative IMPRESSION: 1. No convincing change in the extensive intraventricular clot. 2. Ventriculomegaly from atrophy and partial obstruction when compared with more remote emerging. The ventriculomegaly has mildly increased when measured at frontal horns. Electronically Signed   By: Monte Fantasia M.D.   On: 04/09/2019 11:15   CT Head Wo Contrast  Result Date: 04/04/2019 CLINICAL DATA:  Encephalopathy. EXAM: CT HEAD WITHOUT CONTRAST  TECHNIQUE: Contiguous axial images were obtained from the base of the skull through the vertex without intravenous contrast. COMPARISON:  Head CT and brain MRI, 02/09/2019. FINDINGS: Brain: Acute hemorrhage fills the right lateral ventricle, as well as extending into the third ventricle and layering in the atrium the left lateral ventricle. The lateral ventricles enlarged, right greater than left, larger than on the prior CT. There is mild midline shift to the left of 4 mm, likely accentuated by the patient's positioning, head turned toward the left. There is no fencing parenchymal hemorrhage, although the images suspected to have arisen from the superior right thalamus. No evidence of subdural or subarachnoid hemorrhage. There are no parenchymal masses. No evidence of an acute ischemic infarct. There are no extra-axial masses. Patchy areas of white matter hypoattenuation are noted bilaterally consistent with mild chronic microvascular ischemic change. Vascular: No hyperdense vessel or unexpected calcification. Skull: Normal. Negative for fracture or focal lesion. Sinuses/Orbits: No acute finding. Other: None. IMPRESSION: 1. Acute intracranial hemorrhage with hemorrhage filling and distending the right lateral ventricle, as well as hemorrhage extending into the third ventricle and layering dependently  in the atrium of the left lateral ventricle. There is lateral ventricular enlargement increased when compared to the prior CT suggesting mild hydrocephalus. The origin of the acute intraventricular hemorrhage is likely from the superior right thalamus, where areas of infarction were noted on the brain MRI from 02/03/2019. There is no visualized parenchymal hemorrhage and no evidence of subarachnoid or subdural hemorrhage. No ischemic infarction. Critical Value/emergent results were called by telephone at the time of interpretation on 04/04/2019 at 5:36 pm to provider [facility] , who verbally acknowledged these  results. Electronically Signed   By: Lajean Manes M.D.   On: 04/04/2019 17:37   DG Chest Port 1 View  Result Date: 04/04/2019 CLINICAL DATA:  86 year old female with altered mental status. EXAM: PORTABLE CHEST 1 VIEW COMPARISON:  Chest radiograph dated 02/09/2019. FINDINGS: There is no focal consolidation, pleural effusion, pneumothorax. There is mild cardiomegaly. Atherosclerotic calcification of the aorta. Osteopenia with degenerative changes of the spine and shoulders. There is elevation of the humeral head, likely related to chronic rotator cuff injury. Surgical clips noted over the right chest wall. No acute osseous pathology. IMPRESSION: No acute cardiopulmonary process. Electronically Signed   By: Anner Crete M.D.   On: 04/04/2019 17:46    Microbiology: Recent Results (from the past 240 hour(s))  SARS CORONAVIRUS 2 (TAT 6-24 HRS) Nasopharyngeal Nasopharyngeal Swab     Status: None   Collection Time: 04/04/19  4:46 PM   Specimen: Nasopharyngeal Swab  Result Value Ref Range Status   SARS Coronavirus 2 NEGATIVE NEGATIVE Final    Comment: (NOTE) SARS-CoV-2 target nucleic acids are NOT DETECTED. The SARS-CoV-2 RNA is generally detectable in upper and lower respiratory specimens during the acute phase of infection. Negative results do not preclude SARS-CoV-2 infection, do not rule out co-infections with other pathogens, and should not be used as the sole basis for treatment or other patient management decisions. Negative results must be combined with clinical observations, patient history, and epidemiological information. The expected result is Negative. Fact Sheet for Patients: SugarRoll.be Fact Sheet for Healthcare Providers: https://www.-mathews.com/ This test is not yet approved or cleared by the [place] FDA and  has been authorized for detection and/or diagnosis of SARS-CoV-2 by FDA under an Emergency Use Authorization  (EUA). This EUA will remain  in effect (meaning this test can be used) for the duration of the COVID-19 declaration under Section 56 4(b)(1) of the Act, 21 U.S.C. section 360bbb-3(b)(1), unless the authorization is terminated or revoked sooner. Performed at [facility], [place] [address], [place], [place] [address]   Urine culture     Status: Abnormal   Collection Time: 04/04/19  4:46 PM   Specimen: Urine, Clean Catch  Result Value Ref Range Status   Specimen Description   Final    URINE, CLEAN CATCH Performed at [facility], [place] [address] [place], [place] [address]    Special Requests   Final    NONE Performed at [facility], [place] [address] [place], [place] [address]    Culture >=100,000 COLONIES/mL KLEBSIELLA PNEUMONIAE (A)  Final   Report Status 04/06/2019 FINAL  Final   Organism ID, Bacteria KLEBSIELLA PNEUMONIAE (A)  Final      Susceptibility   Klebsiella pneumoniae - MIC*    AMPICILLIN >=32 RESISTANT Resistant     CEFAZOLIN <=4 SENSITIVE Sensitive     CEFTRIAXONE <=0.25 SENSITIVE Sensitive     CIPROFLOXACIN <=0.25 SENSITIVE Sensitive     GENTAMICIN <=1 SENSITIVE  Sensitive     IMIPENEM <=0.25 SENSITIVE Sensitive     NITROFURANTOIN 128 RESISTANT Resistant     TRIMETH/SULFA <=20 SENSITIVE Sensitive     AMPICILLIN/SULBACTAM 4 SENSITIVE Sensitive     PIP/TAZO <=4 SENSITIVE Sensitive     * >=100,000 COLONIES/mL KLEBSIELLA PNEUMONIAE  MRSA PCR Screening     Status: None   Collection Time: 04/05/19  8:09 PM   Specimen: Nasal Mucosa; Nasopharyngeal  Result Value Ref Range Status   MRSA by PCR NEGATIVE NEGATIVE Final    Comment:        The GeneXpert MRSA Assay (FDA approved for NASAL specimens only), is one component of a comprehensive MRSA colonization surveillance program. It is not intended to diagnose MRSA infection nor to guide or monitor treatment for MRSA infections. Performed at [facility], [place] [address], [place], [place] [address]      Labs: Basic Metabolic Panel: Recent Labs  Lab 04/08/19 0752 04/11/19 0446 04/12/19 0306  NA 145 144 142  K 3.5 3.2* 3.7  CL 112* 113* 112*  CO2 24 25 25   GLUCOSE 124* 127* 120*  BUN 32* 17 16  CREATININE 0.79 0.59 0.55  CALCIUM 9.7 9.7 9.3  MG  --  1.7 1.9  PHOS  --  2.4* 2.2*   Liver Function Tests: Recent Labs  Lab 04/12/19 0306  AST 24  ALT 26  ALKPHOS 69  BILITOT 0.7  PROT 5.8*  ALBUMIN 2.4*   No results for input(s): LIPASE, AMYLASE in the last 168 hours. No results for input(s): AMMONIA in the last 168 hours. CBC: Recent Labs  Lab 04/08/19 0752 04/11/19 0446 04/12/19 0306  WBC 6.8 6.6 6.2  NEUTROABS 4.4 4.0 3.6  HGB 12.4 12.6 11.5*  HCT 38.4 38.9 35.8*  MCV 98.5 97.3 97.3  PLT 271 234 212   Cardiac Enzymes: No results for input(s): CKTOTAL, CKMB, CKMBINDEX, TROPONINI in the last 168 hours. BNP: BNP (last 3 results) No results for input(s): BNP in the last 8760 hours.  ProBNP (last 3 results) No results for input(s): PROBNP in the last 8760 hours.  CBG: Recent Labs  Lab 04/11/19 1154 04/11/19 1822 04/12/19 0004 04/12/19 0646 04/12/19 1133  GLUCAP 142* 131* 107* 102* 123*       Signed:  Dia Crawford, MD [facility] [phone] pager

## 2019-04-12 NOTE — Patient Outreach (Signed)
[facility]) Care Management  04/12/2019  ULIANA LADUE 30-Dec-1928 DI:[id number]   Received Referral : 04/06/19 Referral source: Notification of patient discharge from [facility]  Insurance: [employer]    Patient Admitted to [facility]  3/24-04/12/19 . She was discharged to [facility] on 04/12/19.   Plan Will close case, patient care will be managed at [facility].   Joylene Draft, RN, BSN  [place] Management Coordinator  [phone]- Mobile [phone]- Toll Free Main Office

## 2019-04-12 NOTE — Progress Notes (Signed)
Report called to Helene Kelp at [facility], awaiting [facility]AR for transport.

## 2019-04-23 ENCOUNTER — Other Ambulatory Visit: Payer: Self-pay | Admitting: Neurology

## 2019-05-12 DEATH — deceased

## 2021-08-20 IMAGING — MG DIGITAL DIAGNOSTIC BILAT W/ TOMO W/ CAD
6 of 9 series · 6 of 25 positions shown · non-contrast
Comparison: Previous exam(s).

CLINICAL DATA: 89-year-old female with history of right breast
lumpectomy in 5223. She was diagnosed again with right breast cancer
in 0916. This is her first diagnostic mammogram since her
lumpectomy. The patient has limited mobility and dementia.

EXAM:
DIGITAL DIAGNOSTIC BILATERAL MAMMOGRAM WITH CAD AND TOMO

[R CC]
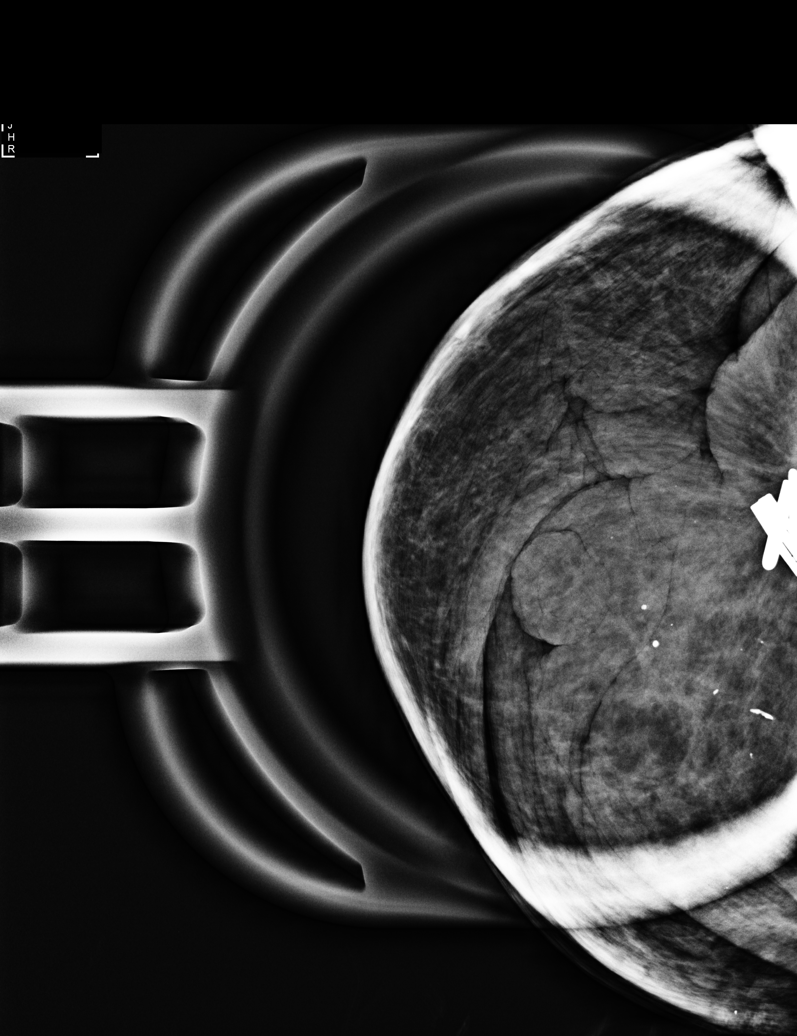

[L CC synth-2D]
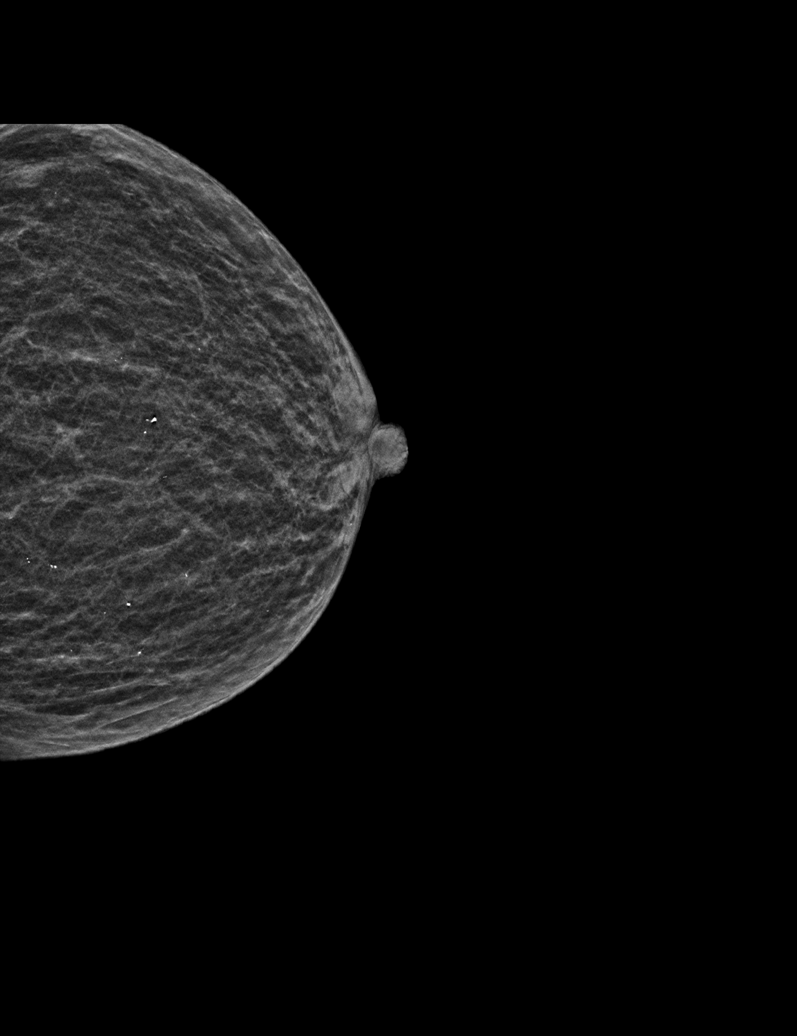

[L MLO synth-2D]
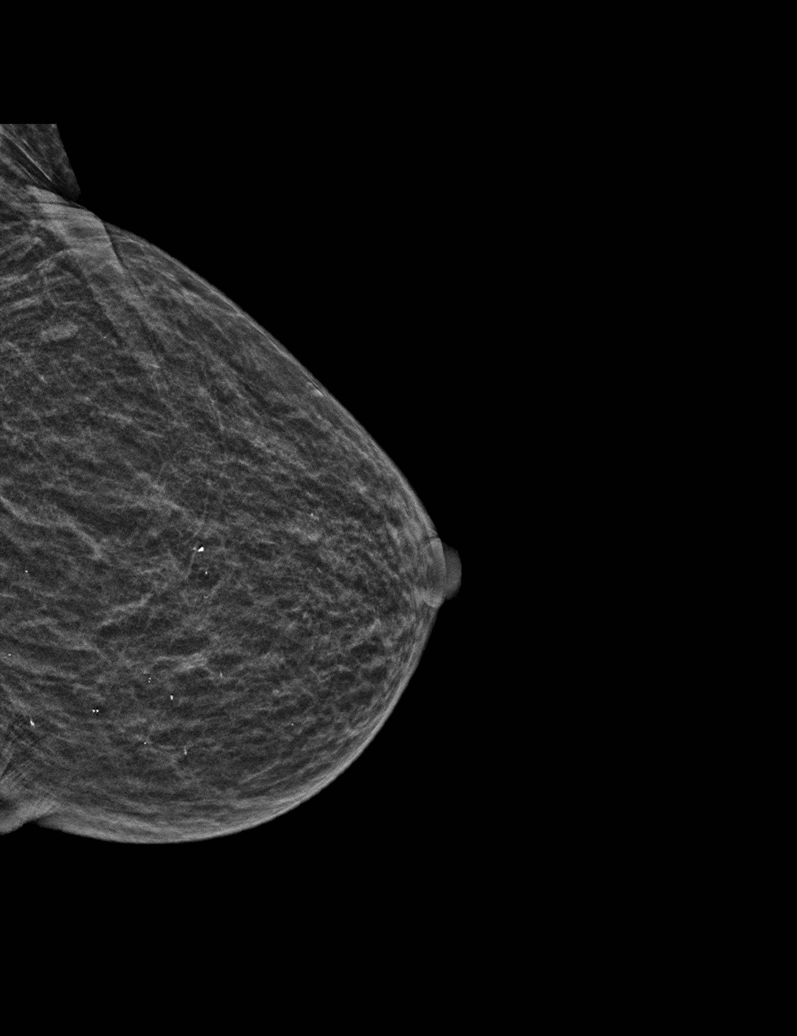

[R CC synth-2D]
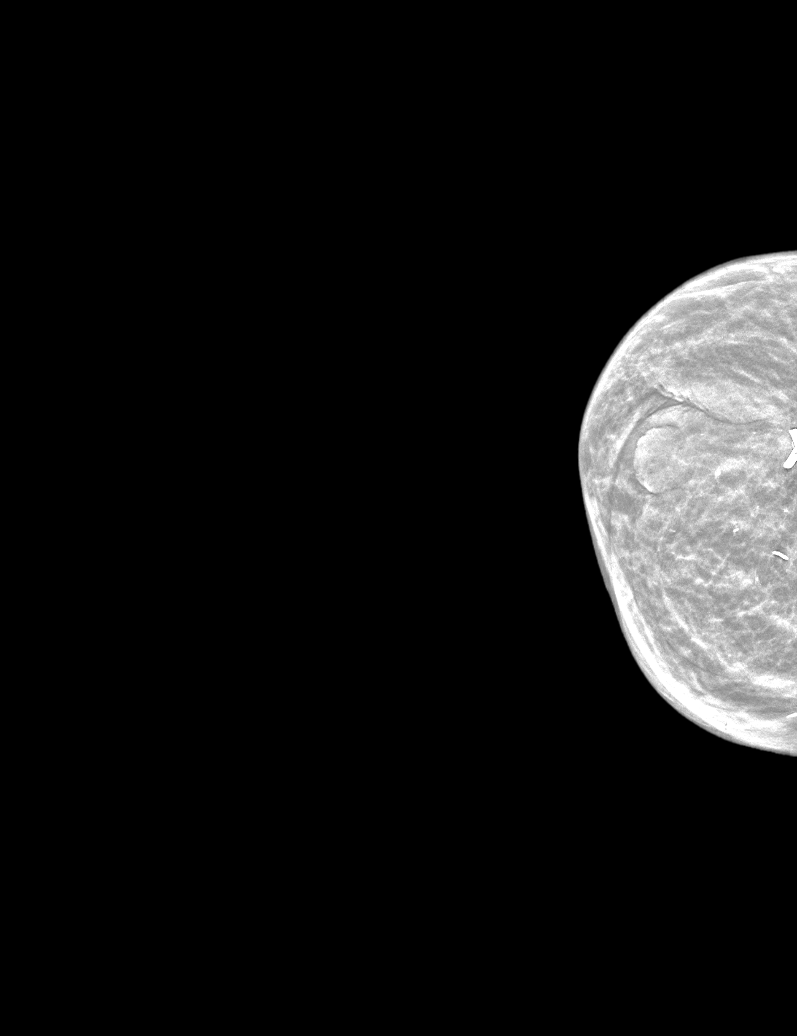

[R MLO synth-2D]
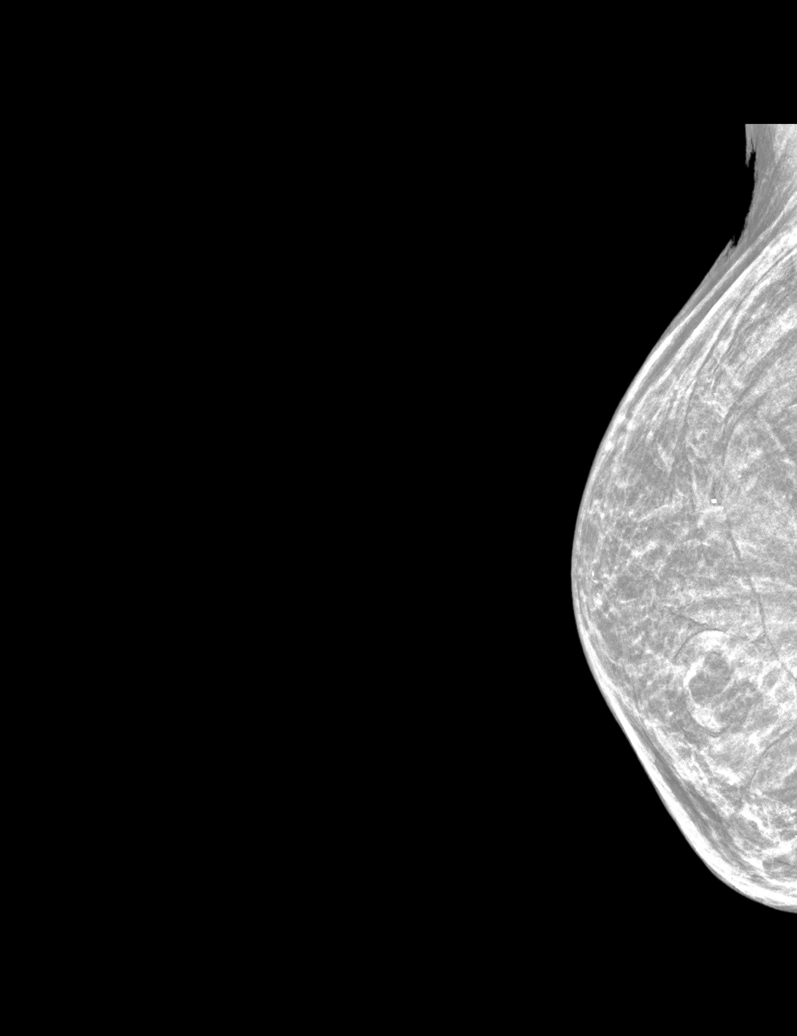

[L MLO tomo · tomo slice 13/26.0]
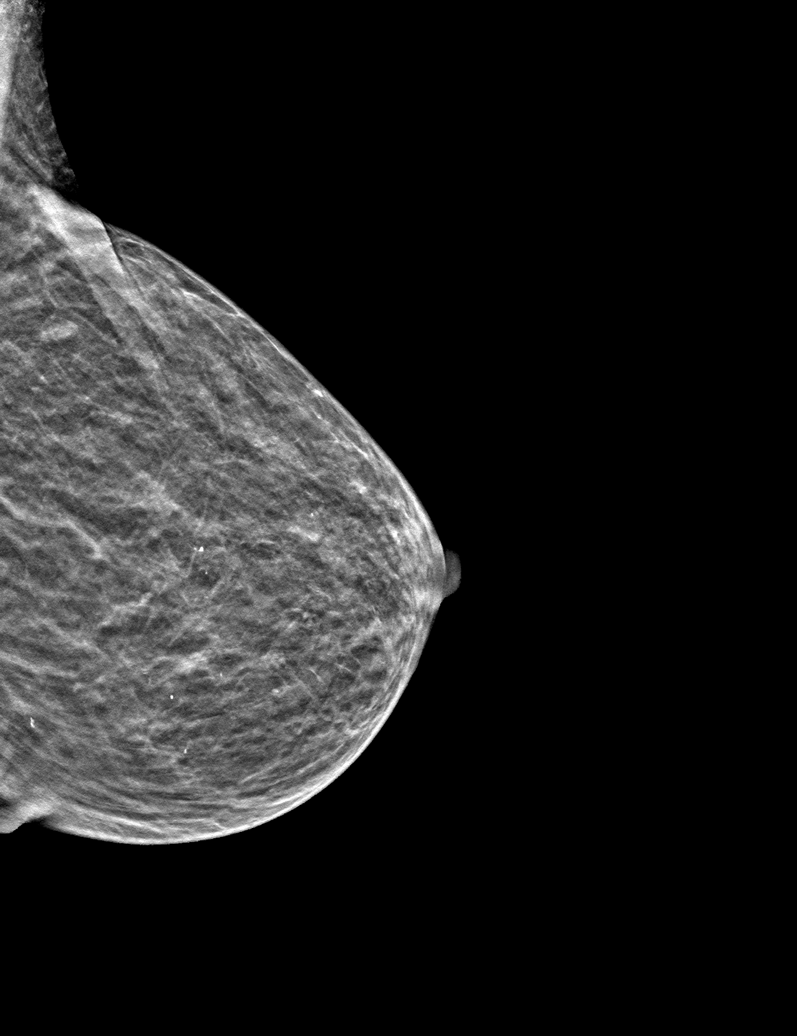

[6 of 25 positions shown; findings below may reference images not displayed]

ACR Breast Density Category d: The breast tissue is extremely dense,
which lowers the sensitivity of mammography.
FINDINGS: Due to the patient's limited mobility and challenge with following
direction, the posterior most tissues in the bilateral breasts are
not included within the field of view. Within these limitations, no
suspicious calcifications, masses or areas of distortion are seen in
the bilateral breasts. There is a stable area of distortion in the
superior right breast, which corresponds with the patient's 5223
lumpectomy. No suspicious findings are seen at the patient's
lumpectomy site in the lateral posterior right breast.

Mammographic images were processed with CAD.
IMPRESSION: 1. Expected surgical changes at the patient's right breast
lumpectomy site. No mammographic evidence of malignancy in the
bilateral breasts.

RECOMMENDATION:
Diagnostic mammogram is suggested in 1 year. (Code:11-A-KKF)

I have discussed the findings and recommendations with the patient.
If applicable, a reminder letter will be sent to the patient
regarding the next appointment.

BI-RADS CATEGORY  2: Benign.
